# Patient Record
Sex: Male | Born: 1945 | Race: White | Hispanic: No | Marital: Married | Smoking: Former smoker
Health system: Southern US, Community
[De-identification: ages and names within clinical notes are randomized; demographics above are authoritative.]

## PROBLEM LIST (undated history)

## (undated) DIAGNOSIS — I6529 Occlusion and stenosis of unspecified carotid artery: Secondary | ICD-10-CM

## (undated) DIAGNOSIS — E119 Type 2 diabetes mellitus without complications: Secondary | ICD-10-CM

## (undated) DIAGNOSIS — C801 Malignant (primary) neoplasm, unspecified: Secondary | ICD-10-CM

## (undated) DIAGNOSIS — K219 Gastro-esophageal reflux disease without esophagitis: Secondary | ICD-10-CM

## (undated) DIAGNOSIS — E1142 Type 2 diabetes mellitus with diabetic polyneuropathy: Secondary | ICD-10-CM

## (undated) DIAGNOSIS — R531 Weakness: Secondary | ICD-10-CM

## (undated) DIAGNOSIS — Z8673 Personal history of transient ischemic attack (TIA), and cerebral infarction without residual deficits: Secondary | ICD-10-CM

## (undated) DIAGNOSIS — I10 Essential (primary) hypertension: Secondary | ICD-10-CM

## (undated) DIAGNOSIS — T884XXA Failed or difficult intubation, initial encounter: Secondary | ICD-10-CM

## (undated) DIAGNOSIS — J90 Pleural effusion, not elsewhere classified: Secondary | ICD-10-CM

## (undated) DIAGNOSIS — E785 Hyperlipidemia, unspecified: Secondary | ICD-10-CM

## (undated) DIAGNOSIS — I639 Cerebral infarction, unspecified: Secondary | ICD-10-CM

## (undated) DIAGNOSIS — M19019 Primary osteoarthritis, unspecified shoulder: Secondary | ICD-10-CM

## (undated) DIAGNOSIS — N183 Chronic kidney disease, stage 3 (moderate): Secondary | ICD-10-CM

## (undated) DIAGNOSIS — I82409 Acute embolism and thrombosis of unspecified deep veins of unspecified lower extremity: Secondary | ICD-10-CM

## (undated) DIAGNOSIS — I679 Cerebrovascular disease, unspecified: Secondary | ICD-10-CM

## (undated) HISTORY — DX: Occlusion and stenosis of unspecified carotid artery: I65.29

## (undated) HISTORY — DX: Personal history of transient ischemic attack (TIA), and cerebral infarction without residual deficits: Z86.73

## (undated) HISTORY — DX: Gastro-esophageal reflux disease without esophagitis: K21.9

## (undated) HISTORY — DX: Weakness: R53.1

## (undated) HISTORY — DX: Acute embolism and thrombosis of unspecified deep veins of unspecified lower extremity: I82.409

## (undated) HISTORY — DX: Primary osteoarthritis, unspecified shoulder: M19.019

## (undated) HISTORY — DX: Cerebrovascular disease, unspecified: I67.9

## (undated) HISTORY — DX: Essential (primary) hypertension: I10

## (undated) HISTORY — DX: Pleural effusion, not elsewhere classified: J90

## (undated) HISTORY — DX: Type 2 diabetes mellitus with diabetic polyneuropathy: E11.42

## (undated) HISTORY — DX: Cerebral infarction, unspecified: I63.9

## (undated) HISTORY — DX: Chronic kidney disease, stage 3 (moderate): N18.3

## (undated) HISTORY — DX: Type 2 diabetes mellitus without complications: E11.9

## (undated) HISTORY — PX: TONSILLECTOMY: SHX5217

## (undated) HISTORY — DX: Hyperlipidemia, unspecified: E78.5

---

## 1998-11-25 HISTORY — PX: NASAL SINUS SURGERY: SHX719

## 2004-02-20 ENCOUNTER — Other Ambulatory Visit: Payer: Self-pay

## 2008-02-10 ENCOUNTER — Ambulatory Visit: Payer: Self-pay | Admitting: Otolaryngology

## 2010-08-07 ENCOUNTER — Emergency Department: Payer: Self-pay | Admitting: Unknown Physician Specialty

## 2014-02-26 DIAGNOSIS — I639 Cerebral infarction, unspecified: Secondary | ICD-10-CM

## 2014-02-26 HISTORY — DX: Cerebral infarction, unspecified: I63.9

## 2014-03-01 DIAGNOSIS — I6529 Occlusion and stenosis of unspecified carotid artery: Secondary | ICD-10-CM

## 2014-03-01 HISTORY — DX: Occlusion and stenosis of unspecified carotid artery: I65.29

## 2014-03-25 ENCOUNTER — Encounter: Payer: Self-pay | Admitting: Surgery

## 2014-03-28 ENCOUNTER — Encounter: Payer: Self-pay | Admitting: Surgery

## 2014-04-11 ENCOUNTER — Encounter: Payer: No Typology Code available for payment source | Admitting: Surgery

## 2014-04-22 ENCOUNTER — Encounter: Payer: Self-pay | Admitting: Surgery

## 2014-04-25 ENCOUNTER — Ambulatory Visit (INDEPENDENT_AMBULATORY_CARE_PROVIDER_SITE_OTHER): Payer: Medicare Other | Admitting: Surgery

## 2014-04-25 ENCOUNTER — Encounter: Payer: Self-pay | Admitting: Surgery

## 2014-04-25 VITALS — BP 162/81 | HR 63 | Ht 70.0 in | Wt 209.4 lb

## 2014-04-25 DIAGNOSIS — I6529 Occlusion and stenosis of unspecified carotid artery: Secondary | ICD-10-CM

## 2014-04-25 NOTE — Progress Notes (Signed)
Patient name: Ian Shaffer MRN: [id number] DOB: 1946/06/24 Sex: male   Referred by: Dr. Loney Hering  Reason for referral:  Chief Complaint  Patient presents with  . New Evaluation    Lt ICA occlusion, hx of CVA    HISTORY OF PRESENT ILLNESS: He went to the beach the same day and a mild air continued to feel poorly.  He went to the emergency department.  He ultimately wound up being diagnosed with a stroke.  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  The patient has regained nearly all of his deficits.  He doesn't endorse some postural hypotension, as well as occasional dizziness.  The patient is a diabetic.  He states his most recent hemoglobin A1c was 6.2-6.7.  He does occasionally get pain in his feet.  He suffers from hypercholesterolemia which is managed with a statin.  He is also medically managed for hypertension.  He tries to exercise for 30-60 minutes per day.  He is now eating a heart healthy diet.  He is on full antiplatelet medications since his stroke with aspirin and Plavix.  He is a former smoker.  Past Medical History  Diagnosis Date  . H/O: CVA (cerebrovascular accident)   . CVA (cerebral infarction)   . ICAO (internal carotid artery occlusion) March 01, 2014    Left  . Weakness   . Hypertension   . Hyperlipidemia   . Diabetes mellitus without complication   . Stroke   . DVT (deep venous thrombosis)     Past Surgical History  Procedure Laterality Date  . Nasal sinus surgery  2000    History   Social History  . Marital Status: Unknown    Spouse Name: N/A    Number of Children: N/A  . Years of Education: N/A   Occupational History  . Not on file.   Social History Main Topics  . Smoking status: Former Research scientist (life sciences)  . Smokeless tobacco: Not on file     Comment: pt states he smoked a pipe a long time ago  . Alcohol Use: No  . Drug Use: No  . Sexual Activity: Not on file   Other Topics Concern  . Not on file   Social History Narrative  .  No narrative on file    Family History  Problem Relation Age of Onset  . Hypertension Mother   . Hypertension Father     Allergies as of 04/25/2014 - Review Complete 04/25/2014  Allergen Reaction Noted  . Sulfa antibiotics Swelling 04/25/2014    No current outpatient prescriptions on file prior to visit.   No current facility-administered medications on file prior to visit.     REVIEW OF SYSTEMS: Cardiovascular: No chest pain, chest pressure, palpitations, orthopnea, or dyspnea on exertion. No claudication or rest pain,  No history of DVT or phlebitis. Pulmonary: No productive cough, asthma or wheezing. Neurologic: Occasional dizziness.  History of right arm weakness Hematologic: No bleeding problems or clotting disorders. Musculoskeletal: No joint pain or joint swelling. Gastrointestinal: No blood in stool or hematemesis Genitourinary: No dysuria or hematuria. Psychiatric:: No history of major depression. Integumentary: No rashes or ulcers. Constitutional: No fever or chills.  PHYSICAL EXAMINATION: General: The patient appears their stated age.  Vital signs are BP 162/81  Pulse 63  Ht 5\' 10"  (1.778 m)  Wt 209 lb 6.4 oz (94.983 kg)  BMI 30.05 kg/m2  SpO2 100% HEENT:  No gross abnormalities Pulmonary: Respirations are non-labored Abdomen: Soft  and non-tender  Musculoskeletal: There are no major deformities.   Neurologic: No focal weakness or paresthesias are detected, Skin: There are no ulcer or rashes noted. Psychiatric: The patient has normal affect. Cardiovascular: There is a regular rate and rhythm without significant murmur appreciated.  No carotid bruits.  Palpable pedal pulses (posterior tibial)  Diagnostic Studies: I have reviewed his outside ultrasound which he brought the ICD.  He has an occluded left internal carotid artery and minimal stenosis on the right, less than 39%.  His outside MRI report shows a small acute left.  Ventricular white matter a  lacunar infarct   Assessment:  Occluded left carotid artery Plan: I stressed the importance of medical management with the patient.  He appears to be optimized from a medical perspective.  I discussed that with an occluded internal carotid artery, which has been confirmed with both ultrasound and MRI, there is no role for revascularization of the occluded left carotid.  I stressed the importance of serial ultrasound surveillance of the right carotid artery.  I have scheduled him for followup in one year.     Eldridge Abrahams, M.D. [facility] Office: [phone] Pager:  [phone]

## 2014-04-25 NOTE — Addendum Note (Signed)
Addended by: Mena Goes on: 04/25/2014 05:11 PM   Modules accepted: Orders

## 2014-05-31 DIAGNOSIS — I679 Cerebrovascular disease, unspecified: Secondary | ICD-10-CM

## 2014-05-31 DIAGNOSIS — E119 Type 2 diabetes mellitus without complications: Secondary | ICD-10-CM | POA: Insufficient documentation

## 2014-05-31 DIAGNOSIS — I639 Cerebral infarction, unspecified: Secondary | ICD-10-CM

## 2014-05-31 DIAGNOSIS — K219 Gastro-esophageal reflux disease without esophagitis: Secondary | ICD-10-CM | POA: Insufficient documentation

## 2014-05-31 DIAGNOSIS — I10 Essential (primary) hypertension: Secondary | ICD-10-CM | POA: Insufficient documentation

## 2014-05-31 DIAGNOSIS — E785 Hyperlipidemia, unspecified: Secondary | ICD-10-CM | POA: Insufficient documentation

## 2014-05-31 HISTORY — DX: Gastro-esophageal reflux disease without esophagitis: K21.9

## 2014-05-31 HISTORY — DX: Essential (primary) hypertension: I10

## 2014-05-31 HISTORY — DX: Cerebrovascular disease, unspecified: I67.9

## 2014-05-31 HISTORY — DX: Cerebral infarction, unspecified: I63.9

## 2015-05-01 ENCOUNTER — Ambulatory Visit: Payer: Medicare Other | Admitting: Family

## 2015-05-01 ENCOUNTER — Other Ambulatory Visit (HOSPITAL_COMMUNITY): Payer: Medicare Other

## 2015-05-03 ENCOUNTER — Encounter: Payer: Self-pay | Admitting: Family

## 2015-05-08 ENCOUNTER — Ambulatory Visit (INDEPENDENT_AMBULATORY_CARE_PROVIDER_SITE_OTHER): Payer: Medicare Other | Admitting: Family

## 2015-05-08 ENCOUNTER — Ambulatory Visit (HOSPITAL_COMMUNITY)
Admission: RE | Admit: 2015-05-08 | Discharge: 2015-05-08 | Disposition: A | Discharge status: Home / Self Care | Payer: Medicare Other | Source: Ambulatory Visit | Attending: Family | Admitting: Family

## 2015-05-08 ENCOUNTER — Encounter: Payer: Self-pay | Admitting: Family

## 2015-05-08 VITALS — BP 144/82 | HR 64 | Resp 16 | Ht 70.0 in | Wt 214.0 lb

## 2015-05-08 DIAGNOSIS — I6522 Occlusion and stenosis of left carotid artery: Secondary | ICD-10-CM

## 2015-05-08 DIAGNOSIS — Z8673 Personal history of transient ischemic attack (TIA), and cerebral infarction without residual deficits: Secondary | ICD-10-CM

## 2015-05-08 DIAGNOSIS — I6523 Occlusion and stenosis of bilateral carotid arteries: Secondary | ICD-10-CM | POA: Diagnosis present

## 2015-05-08 NOTE — Patient Instructions (Signed)
Stroke Prevention Some medical conditions and behaviors are associated with an increased chance of having a stroke. You may prevent a stroke by making healthy choices and managing medical conditions. HOW CAN I REDUCE MY RISK OF HAVING A STROKE?   Stay physically active. Get at least 30 minutes of activity on most or all days.  Do not smoke. It may also be helpful to avoid exposure to secondhand smoke.  Limit alcohol use. Moderate alcohol use is considered to be:  No more than 2 drinks per day for men.  No more than 1 drink per day for nonpregnant women.  Eat healthy foods. This involves:  Eating 5 or more servings of fruits and vegetables a day.  Making dietary changes that address high blood pressure (hypertension), high cholesterol, diabetes, or obesity.  Manage your cholesterol levels.  Making food choices that are high in fiber and low in saturated fat, trans fat, and cholesterol may control cholesterol levels.  Take any prescribed medicines to control cholesterol as directed by your health care provider.  Manage your diabetes.  Controlling your carbohydrate and sugar intake is recommended to manage diabetes.  Take any prescribed medicines to control diabetes as directed by your health care provider.  Control your hypertension.  Making food choices that are low in salt (sodium), saturated fat, trans fat, and cholesterol is recommended to manage hypertension.  Take any prescribed medicines to control hypertension as directed by your health care provider.  Maintain a healthy weight.  Reducing calorie intake and making food choices that are low in sodium, saturated fat, trans fat, and cholesterol are recommended to manage weight.  Stop drug abuse.  Avoid taking birth control pills.  Talk to your health care provider about the risks of taking birth control pills if you are over 35 years old, smoke, get migraines, or have ever had a blood clot.  Get evaluated for sleep  disorders (sleep apnea).  Talk to your health care provider about getting a sleep evaluation if you snore a lot or have excessive sleepiness.  Take medicines only as directed by your health care provider.  For some people, aspirin or blood thinners (anticoagulants) are helpful in reducing the risk of forming abnormal blood clots that can lead to stroke. If you have the irregular heart rhythm of atrial fibrillation, you should be on a blood thinner unless there is a good reason you cannot take them.  Understand all your medicine instructions.  Make sure that other conditions (such as anemia or atherosclerosis) are addressed. SEEK IMMEDIATE MEDICAL CARE IF:   You have sudden weakness or numbness of the face, arm, or leg, especially on one side of the body.  Your face or eyelid droops to one side.  You have sudden confusion.  You have trouble speaking (aphasia) or understanding.  You have sudden trouble seeing in one or both eyes.  You have sudden trouble walking.  You have dizziness.  You have a loss of balance or coordination.  You have a sudden, severe headache with no known cause.  You have new chest pain or an irregular heartbeat. Any of these symptoms may represent a serious problem that is an emergency. Do not wait to see if the symptoms will go away. Get medical help at once. Call your local emergency services (911 in [place] Do not drive yourself to the hospital. Document Released: 12/19/2004 Document Revised: 03/28/2014 Document Reviewed: 05/14/2013 [employer] Patient Information 2015 [employer]. This information is not intended to replace advice given   to you by your health care provider. Make sure you discuss any questions you have with your health care provider.  

## 2015-05-08 NOTE — Progress Notes (Signed)
Established Carotid Patient   History of Present Illness  Ian Shaffer is a 69 y.o. male patient of Dr. Trula Slade who has a history of a stroke on 02/26/14 (small acute left ventricular white matter lacunar infarct) and a known left ICA occlusion. He returns today for follow up. Patient has not had previous carotid artery intervention. Dr. Trula Slade reviewed pt carotid Duplex from an outside facility at pt's June 2015 visit.  He had an occluded left internal carotid artery and minimal stenosis on the right, less than 39%. His outside MRI report shows a small acute left ventricular white matter a lacunar infarct.  His stroke was manifested as right upper extremity weakness and loss of hand dexterity that lasted only 3 weeks, dizziness; he denies amaurosis fugax, denies speech difficulties during his stroke. He denies any subsequent stroke or TIA symptoms.   Pt states that his dizziness may be related to his sinus issues.  He states he has drastically changed his diet for the better and is exercising 30 minutes or more/day.   Pt denies claudication symptoms with walking, denies non healing wounds.  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   The patient denies New Medical or Surgical History.  Pt Diabetic: yes, states his last A1C was 6.1 Pt smoker: former smoker of a pipe only, quit in 1975  Pt meds include: Statin : yes ASA: yes Other anticoagulants/antiplatelets: Plavix   Past Medical History  Diagnosis Date  . H/O: CVA (cerebrovascular accident)   . CVA (cerebral infarction)   . ICAO (internal carotid artery occlusion) March 01, 2014    Left  . Weakness   . Hypertension   . Hyperlipidemia   . Diabetes mellitus without complication   . DVT (deep venous thrombosis)   . Stroke April, 4,2015    Social History History  Substance Use Topics  . Smoking status: Former Smoker    Types: Pipe    Quit date: 05/07/1974  . Smokeless tobacco: Never Used     Comment: pt states he  smoked a pipe a long time ago  . Alcohol Use: No    Family History Family History  Problem Relation Age of Onset  . Hypertension Mother   . Hypertension Father     Surgical History Past Surgical History  Procedure Laterality Date  . Nasal sinus surgery  2000    Allergies  Allergen Reactions  . Sulfa Antibiotics Swelling    Current Outpatient Prescriptions  Medication Sig Dispense Refill  . amLODipine (NORVASC) 5 MG tablet Take 5 mg by mouth daily.    Marland Kitchen aspirin 81 MG tablet Take 81 mg by mouth daily.    . clopidogrel (PLAVIX) 75 MG tablet Take 75 mg by mouth daily with breakfast.     . Liraglutide (VICTOZA [place]) Inject 1.2 mg into the skin daily.     Marland Kitchen lisinopril (PRINIVIL,ZESTRIL) 10 MG tablet Take by mouth daily.    . metoprolol succinate (TOPROL-XL) 25 MG 24 hr tablet Take 25 mg by mouth daily.     . pantoprazole (PROTONIX) 40 MG tablet Take 40 mg by mouth daily.     . pravastatin (PRAVACHOL) 20 MG tablet Take 20 mg by mouth daily.      No current facility-administered medications for this visit.    Review of Systems : See HPI for pertinent positives and negatives.  Physical Examination  Filed Vitals:   05/08/15 1453 05/08/15 1458  BP: 153/84 144/82  Pulse: 65 64  Resp:  16  Height:  5\' 10"  (1.778 m)  Weight:  214 lb (97.07 kg)  SpO2:  99%   Body mass index is 30.71 kg/(m^2).  General: WDWN male in NAD GAIT: normal Eyes: PERRLA Pulmonary:  Non-labored, CTAB, no adventitious sounds.  Cardiac: regular Rhythm, no detected murmur.  VASCULAR EXAM Carotid Bruits Right Left   Negative Negative    Aorta is not palpable. Radial pulses are 2+ palpable and equal.                                                                                                                            LE Pulses Right Left       POPLITEAL  not palpable   not palpable       POSTERIOR TIBIAL  2+ palpable   2+ palpable        DORSALIS PEDIS      ANTERIOR TIBIAL 2+ palpable  2+  palpable     Gastrointestinal: soft, nontender, BS WNL, no r/g,  no palpable masses.  Musculoskeletal: No muscle atrophy/wasting. M/S 5/5 throughout, extremities without ischemic changes.  Neurologic: A&O X 3; Appropriate Affect;  Speech is normal, CN 2-12 intact, Pain and light touch intact in extremities, Motor exam as listed above.   Non-Invasive Vascular Imaging CAROTID DUPLEX 05/08/2015   CEREBROVASCULAR DUPLEX EVALUATION    INDICATION: Carotid artery disease     PREVIOUS INTERVENTION(S):     DUPLEX EXAM:     RIGHT  LEFT  Peak Systolic Velocities (cm/s) End Diastolic Velocities (cm/s) Plaque LOCATION Peak Systolic Velocities (cm/s) End Diastolic Velocities (cm/s) Plaque  83 11  CCA PROXIMAL 96 17   82 18  CCA MID 81 13 HT  70 15 HT CCA DISTAL 72 13 HT  149 17  ECA 153 24   92 25 HT ICA PROXIMAL 0 0 HT  73 26  ICA MID 0 0 HT  64 23  ICA DISTAL 0 0 HT    1.12 ICA / CCA Ratio (PSV) Not applicable  Antegrade  Vertebral Flow Antegrade   443 Brachial Systolic Pressure (mmHg) 154  Multiphasic (Subclavian artery) Brachial Artery Waveforms Multiphasic (Subclavian artery)    Plaque Morphology:  HM = Homogeneous, HT = Heterogeneous, CP = Calcific Plaque, SP = Smooth Plaque, IP = Irregular Plaque  ADDITIONAL FINDINGS:     IMPRESSION: Right internal carotid artery velocities suggest a 1-49% stenosis. There is no color or Doppler flow noted within the left internal carotid artery.       Compared to the previous exam:  No prior exam performed at this facility for comparison.       Assessment: Ian Shaffer is a 69 y.o. male who has a history of a stroke on 02/26/14 (small acute left ventricular white matter lacunar infarct) and a known left ICA occlusion. He has had no subsequent stroke or TIA. Today's carotid Duplex suggests 1-49% right ICA stenosis and confirmed left ICA occlusion.  Plan: Follow-up in 1 year with Carotid Duplex   I discussed in depth with the  patient the nature of atherosclerosis, and emphasized the importance of maximal medical management including strict control of blood pressure, blood glucose, and lipid levels, obtaining regular exercise, and continued cessation of smoking.  The patient is aware that without maximal medical management the underlying atherosclerotic disease process will progress, limiting the benefit of any interventions. The patient was given information about stroke prevention and what symptoms should prompt the patient to seek immediate medical care. Thank you for allowing [place] to participate in this patient's care.  Clemon Chambers, RN, MSN, FNP-C [facility] Office: [facility] Physician: Trula Slade   05/08/2015 3:16 PM

## 2016-02-23 DIAGNOSIS — N183 Chronic kidney disease, stage 3 unspecified: Secondary | ICD-10-CM | POA: Insufficient documentation

## 2016-02-23 DIAGNOSIS — E1142 Type 2 diabetes mellitus with diabetic polyneuropathy: Secondary | ICD-10-CM | POA: Insufficient documentation

## 2016-02-23 HISTORY — DX: Chronic kidney disease, stage 3 unspecified: N18.30

## 2016-02-23 HISTORY — DX: Type 2 diabetes mellitus with diabetic polyneuropathy: E11.42

## 2016-05-06 ENCOUNTER — Encounter: Payer: Self-pay | Admitting: Family

## 2016-05-13 ENCOUNTER — Encounter: Payer: Self-pay | Admitting: Family

## 2016-05-13 ENCOUNTER — Ambulatory Visit (INDEPENDENT_AMBULATORY_CARE_PROVIDER_SITE_OTHER): Payer: Medicare Other | Admitting: Family

## 2016-05-13 ENCOUNTER — Ambulatory Visit (HOSPITAL_COMMUNITY)
Admission: RE | Admit: 2016-05-13 | Discharge: 2016-05-13 | Disposition: A | Discharge status: Home / Self Care | Payer: Medicare Other | Source: Ambulatory Visit | Attending: Family | Admitting: Family

## 2016-05-13 VITALS — BP 132/75 | HR 74 | Temp 98.1°F | Resp 16 | Ht 70.0 in | Wt 217.0 lb

## 2016-05-13 DIAGNOSIS — E119 Type 2 diabetes mellitus without complications: Secondary | ICD-10-CM | POA: Insufficient documentation

## 2016-05-13 DIAGNOSIS — Z8673 Personal history of transient ischemic attack (TIA), and cerebral infarction without residual deficits: Secondary | ICD-10-CM

## 2016-05-13 DIAGNOSIS — I10 Essential (primary) hypertension: Secondary | ICD-10-CM | POA: Insufficient documentation

## 2016-05-13 DIAGNOSIS — I6521 Occlusion and stenosis of right carotid artery: Secondary | ICD-10-CM | POA: Diagnosis not present

## 2016-05-13 DIAGNOSIS — I6522 Occlusion and stenosis of left carotid artery: Secondary | ICD-10-CM

## 2016-05-13 DIAGNOSIS — E785 Hyperlipidemia, unspecified: Secondary | ICD-10-CM | POA: Diagnosis not present

## 2016-05-13 NOTE — Patient Instructions (Signed)
Stroke Prevention Some medical conditions and behaviors are associated with an increased chance of having a stroke. You may prevent a stroke by making healthy choices and managing medical conditions. HOW CAN I REDUCE MY RISK OF HAVING A STROKE?   Stay physically active. Get at least 30 minutes of activity on most or all days.  Do not smoke. It may also be helpful to avoid exposure to secondhand smoke.  Limit alcohol use. Moderate alcohol use is considered to be:  No more than 2 drinks per day for men.  No more than 1 drink per day for nonpregnant women.  Eat healthy foods. This involves:  Eating 5 or more servings of fruits and vegetables a day.  Making dietary changes that address high blood pressure (hypertension), high cholesterol, diabetes, or obesity.  Manage your cholesterol levels.  Making food choices that are high in fiber and low in saturated fat, trans fat, and cholesterol may control cholesterol levels.  Take any prescribed medicines to control cholesterol as directed by your health care provider.  Manage your diabetes.  Controlling your carbohydrate and sugar intake is recommended to manage diabetes.  Take any prescribed medicines to control diabetes as directed by your health care provider.  Control your hypertension.  Making food choices that are low in salt (sodium), saturated fat, trans fat, and cholesterol is recommended to manage hypertension.  Ask your health care provider if you need treatment to lower your blood pressure. Take any prescribed medicines to control hypertension as directed by your health care provider.  If you are 18-39 years of age, have your blood pressure checked every 3-5 years. If you are 40 years of age or older, have your blood pressure checked every year.  Maintain a healthy weight.  Reducing calorie intake and making food choices that are low in sodium, saturated fat, trans fat, and cholesterol are recommended to manage  weight.  Stop drug abuse.  Avoid taking birth control pills.  Talk to your health care provider about the risks of taking birth control pills if you are over 35 years old, smoke, get migraines, or have ever had a blood clot.  Get evaluated for sleep disorders (sleep apnea).  Talk to your health care provider about getting a sleep evaluation if you snore a lot or have excessive sleepiness.  Take medicines only as directed by your health care provider.  For some people, aspirin or blood thinners (anticoagulants) are helpful in reducing the risk of forming abnormal blood clots that can lead to stroke. If you have the irregular heart rhythm of atrial fibrillation, you should be on a blood thinner unless there is a good reason you cannot take them.  Understand all your medicine instructions.  Make sure that other conditions (such as anemia or atherosclerosis) are addressed. SEEK IMMEDIATE MEDICAL CARE IF:   You have sudden weakness or numbness of the face, arm, or leg, especially on one side of the body.  Your face or eyelid droops to one side.  You have sudden confusion.  You have trouble speaking (aphasia) or understanding.  You have sudden trouble seeing in one or both eyes.  You have sudden trouble walking.  You have dizziness.  You have a loss of balance or coordination.  You have a sudden, severe headache with no known cause.  You have new chest pain or an irregular heartbeat. Any of these symptoms may represent a serious problem that is an emergency. Do not wait to see if the symptoms will   go away. Get medical help at once. Call your local emergency services (911 in [place] Do not drive yourself to the hospital.   This information is not intended to replace advice given to you by your health care provider. Make sure you discuss any questions you have with your health care provider.   Document Released: 12/19/2004 Document Revised: 12/02/2014 Document Reviewed:  05/14/2013 Elsevier Interactive Patient Education 2016 [employer]  

## 2016-05-13 NOTE — Progress Notes (Signed)
Chief Complaint: Follow up Extracranial Carotid Artery Stenosis   History of Present Illness  Ian Shaffer is a 70 y.o. male patient of Dr. Trula Slade who has a history of a stroke on 02/26/14 (small acute left ventricular white matter lacunar infarct) and a known left ICA occlusion. He returns today for follow up. Patient has not had previous carotid artery intervention. Dr. Trula Slade reviewed pt carotid Duplex from an outside facility at pt's June 2015 visit. He had an occluded left internal carotid artery and minimal stenosis on the right, less than 39%. His outside MRI report shows a small acute left ventricular white matter a lacunar infarct.  His stroke was manifested as right upper extremity weakness and loss of hand dexterity that lasted 3 weeks, dizziness; he denies amaurosis fugax, denies speech difficulties during his stroke. He denies any subsequent stroke or TIA symptoms.   Pt states that his dizziness may be related to his sinus issues.  He states he has drastically changed his diet for the better and is exercising 30 minutes or more/day.   Pt denies claudication symptoms with walking, denies non healing wounds.  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   The patient denies New Medical or Surgical History.  Pt Diabetic: yes, states his last A1C was 6.1 Pt smoker: former smoker of a pipe only, quit in 1975  Pt meds include: Statin : yes ASA: yes Other anticoagulants/antiplatelets: Plavix    Past Medical History  Diagnosis Date  . H/O: CVA (cerebrovascular accident)   . CVA (cerebral infarction)   . ICAO (internal carotid artery occlusion) March 01, 2014    Left  . Weakness   . Hypertension   . Hyperlipidemia   . Diabetes mellitus without complication ([place])   . DVT (deep venous thrombosis) ([place])   . Stroke [facility]) April, 4,2015    Social History Social History  Substance Use Topics  . Smoking status: Former Smoker    Types: Pipe    Quit date: 05/07/1974   . Smokeless tobacco: Never Used     Comment: pt states he smoked a pipe a long time ago  . Alcohol Use: No    Family History Family History  Problem Relation Age of Onset  . Hypertension Mother   . Hypertension Father     Surgical History Past Surgical History  Procedure Laterality Date  . Nasal sinus surgery  2000    Allergies  Allergen Reactions  . Amoxicillin Nausea Only  . Sulfa Antibiotics Swelling    Current Outpatient Prescriptions  Medication Sig Dispense Refill  . amLODipine (NORVASC) 5 MG tablet Take 5 mg by mouth daily.    Marland Kitchen aspirin 81 MG tablet Take 81 mg by mouth daily.    . clopidogrel (PLAVIX) 75 MG tablet Take 75 mg by mouth daily with breakfast.     . Liraglutide (VICTOZA [place]) Inject 1.2 mg into the skin daily.     Marland Kitchen lisinopril (PRINIVIL,ZESTRIL) 10 MG tablet Take by mouth daily.    . metoprolol succinate (TOPROL-XL) 25 MG 24 hr tablet Take 25 mg by mouth daily.     . pantoprazole (PROTONIX) 40 MG tablet Take 40 mg by mouth daily.     . pravastatin (PRAVACHOL) 20 MG tablet Take 40 mg by mouth daily.      No current facility-administered medications for this visit.    Review of Systems : See HPI for pertinent positives and negatives.  Physical Examination  Filed Vitals:   05/13/16  1351 05/13/16 1353  BP: 137/78 132/75  Pulse: 72 74  Temp: 98.1 F (36.7 C)   TempSrc: Oral   Resp: 16   Height: 5\' 10"  (1.778 m)   Weight: 217 lb (98.431 kg)   SpO2: 98%    Body mass index is 31.14 kg/(m^2).  General: WDWN obese male in NAD GAIT: normal Eyes: PERRLA Pulmonary: Respirations are non-labored, CTAB, no adventitious sounds.  Cardiac: regular rhythm, no detected murmur.  VASCULAR EXAM Carotid Bruits Right Left   Negative Negative   Aorta is not palpable. Radial pulses are 2+ palpable and equal.      LE  Pulses Right Left   POPLITEAL not palpable  not palpable   POSTERIOR TIBIAL 2+ palpable  2+ palpable    DORSALIS PEDIS  ANTERIOR TIBIAL 2+ palpable  2+ palpable     Gastrointestinal: soft, nontender, BS WNL, no r/g, no palpable masses.  Musculoskeletal: No muscle atrophy/wasting. M/S 5/5 throughout, extremities without ischemic changes.  Neurologic: A&O X 3; Appropriate Affect;  Speech is normal, CN 2-12 intact, Pain and light touch intact in extremities, Motor exam as listed above.          Non-Invasive Vascular Imaging CAROTID DUPLEX 05/13/2016   CEREBROVASCULAR DUPLEX EVALUATION    INDICATION: Carotid artery disease. Known left internal carotid artery occlusion    PREVIOUS INTERVENTION(S):     DUPLEX EXAM: Bilateral carotid duplex    RIGHT  LEFT  Peak Systolic Velocities (cm/s) End Diastolic Velocities (cm/s) Plaque LOCATION Peak Systolic Velocities (cm/s) End Diastolic Velocities (cm/s) Plaque  123 21  CCA PROXIMAL 137 15   89 21  CCA MID 99 18   103 28  CCA DISTAL 95 21   156 24  ECA 175 26   72 21 HT ICA PROXIMAL 52 0   79 32  ICA MID 26 0 HT  62 24  ICA DISTAL 0 0 Occluded    Antegrade Vertebral Flow Antegrade   Brachial Systolic Pressure (mmHg)   Triphasic Subclavian Artery Waveforms Triphasic    Plaque Morphology:  HM = Homogeneous, HT = Heterogeneous, CP = Calcific Plaque, SP = Smooth Plaque, IP = Irregular Plaque     ADDITIONAL FINDINGS:     IMPRESSION: 1. Right:  1-39% internal carotid artery stenosis. 2. Left:  diminished, thumpy flow in the proximal and mid internal carotid artery with no flow noted in the distal portion consistent with occlusion.    Compared to the previous exam:  No significant change compared to exam on 05-08-15.      Assessment: Ian Shaffer is a 70 y.o. male who has a history of a stroke on 02/26/14 (small acute left ventricular white matter lacunar infarct) and a known left ICA  occlusion. He has had no subsequent stroke or TIA. Today's carotid Duplex suggests 1-39% right ICA stenosis and confirmed left ICA occlusion. No significant change compared to exam on 05-08-15.    Plan: Follow-up in 1 year with Carotid Duplex scan.   I discussed in depth with the patient the nature of atherosclerosis, and emphasized the importance of maximal medical management including strict control of blood pressure, blood glucose, and lipid levels, obtaining regular exercise, and continued cessation of smoking.  The patient is aware that without maximal medical management the underlying atherosclerotic disease process will progress, limiting the benefit of any interventions. The patient was given information about stroke prevention and what symptoms should prompt the patient to seek immediate medical care. Thank you for allowing  [place] to participate in this patient's care.  Clemon Chambers, RN, MSN, FNP-C [facility] Office: [facility] Physician: Trula Slade  05/13/2016 1:56 PM

## 2017-04-22 ENCOUNTER — Encounter: Payer: Self-pay | Admitting: Emergency Medicine

## 2017-04-22 ENCOUNTER — Emergency Department: Payer: Medicare Other

## 2017-04-22 ENCOUNTER — Observation Stay
Admission: EM | Admit: 2017-04-22 | Discharge: 2017-04-23 | Disposition: A | Discharge status: Home / Self Care | Payer: Medicare Other | Attending: Internal Medicine | Admitting: Internal Medicine

## 2017-04-22 DIAGNOSIS — Z882 Allergy status to sulfonamides status: Secondary | ICD-10-CM | POA: Insufficient documentation

## 2017-04-22 DIAGNOSIS — Z7984 Long term (current) use of oral hypoglycemic drugs: Secondary | ICD-10-CM | POA: Diagnosis not present

## 2017-04-22 DIAGNOSIS — Z7902 Long term (current) use of antithrombotics/antiplatelets: Secondary | ICD-10-CM | POA: Insufficient documentation

## 2017-04-22 DIAGNOSIS — Z87891 Personal history of nicotine dependence: Secondary | ICD-10-CM | POA: Insufficient documentation

## 2017-04-22 DIAGNOSIS — Z880 Allergy status to penicillin: Secondary | ICD-10-CM | POA: Diagnosis not present

## 2017-04-22 DIAGNOSIS — Z86718 Personal history of other venous thrombosis and embolism: Secondary | ICD-10-CM | POA: Diagnosis not present

## 2017-04-22 DIAGNOSIS — Z8673 Personal history of transient ischemic attack (TIA), and cerebral infarction without residual deficits: Secondary | ICD-10-CM | POA: Insufficient documentation

## 2017-04-22 DIAGNOSIS — N183 Chronic kidney disease, stage 3 (moderate): Secondary | ICD-10-CM | POA: Insufficient documentation

## 2017-04-22 DIAGNOSIS — I493 Ventricular premature depolarization: Secondary | ICD-10-CM

## 2017-04-22 DIAGNOSIS — I129 Hypertensive chronic kidney disease with stage 1 through stage 4 chronic kidney disease, or unspecified chronic kidney disease: Secondary | ICD-10-CM | POA: Insufficient documentation

## 2017-04-22 DIAGNOSIS — Z7982 Long term (current) use of aspirin: Secondary | ICD-10-CM | POA: Diagnosis not present

## 2017-04-22 DIAGNOSIS — E785 Hyperlipidemia, unspecified: Secondary | ICD-10-CM | POA: Insufficient documentation

## 2017-04-22 DIAGNOSIS — J90 Pleural effusion, not elsewhere classified: Secondary | ICD-10-CM | POA: Diagnosis not present

## 2017-04-22 DIAGNOSIS — R0602 Shortness of breath: Secondary | ICD-10-CM | POA: Diagnosis present

## 2017-04-22 DIAGNOSIS — K219 Gastro-esophageal reflux disease without esophagitis: Secondary | ICD-10-CM | POA: Insufficient documentation

## 2017-04-22 DIAGNOSIS — E1122 Type 2 diabetes mellitus with diabetic chronic kidney disease: Secondary | ICD-10-CM | POA: Insufficient documentation

## 2017-04-22 LAB — BASIC METABOLIC PANEL
Anion gap: 9 (ref 5–15)
BUN: 27 mg/dL — ABNORMAL HIGH (ref 6–20)
CO2: 24 mmol/L (ref 22–32)
Calcium: 9.7 mg/dL (ref 8.9–10.3)
Chloride: 105 mmol/L (ref 101–111)
Creatinine, Ser: 1.48 mg/dL — ABNORMAL HIGH (ref 0.61–1.24)
GFR calc Af Amer: 53 mL/min — ABNORMAL LOW (ref >60)
GFR calc non Af Amer: 46 mL/min — ABNORMAL LOW (ref >60)
GLUCOSE: 131 mg/dL — AB (ref 65–99)
Potassium: 4.2 mmol/L (ref 3.5–5.1)
Sodium: 138 mmol/L (ref 135–145)

## 2017-04-22 LAB — CBC
HCT: 42.2 % (ref 40.0–52.0)
HEMOGLOBIN: 14.9 g/dL (ref 13.0–18.0)
MCH: 30.8 pg (ref 26.0–34.0)
MCHC: 35.3 g/dL (ref 32.0–36.0)
MCV: 87.3 fL (ref 80.0–100.0)
Platelets: 231 10*3/uL (ref 150–440)
RBC: 4.84 MIL/uL (ref 4.40–5.90)
RDW: 12.5 % (ref 11.5–14.5)
WBC: 7.8 10*3/uL (ref 3.8–10.6)

## 2017-04-22 LAB — APTT: aPTT: 25 seconds (ref 24–36)

## 2017-04-22 LAB — PROTIME-INR
INR: 0.93
Prothrombin Time: 12.5 seconds (ref 11.4–15.2)

## 2017-04-22 LAB — TROPONIN I: Troponin I: <0.03 ng/mL (ref <0.03)

## 2017-04-22 LAB — BRAIN NATRIURETIC PEPTIDE: B NATRIURETIC PEPTIDE 5: 13 pg/mL (ref 0.0–100.0)

## 2017-04-22 NOTE — ED Triage Notes (Signed)
Seen through [facility] today for C/O SOB.  Patient sent to ED for EKG changes. EKG reviewed by Dr. Cinda Quest, who signed off on EKG.  Ordered no repeat EKG.  Patient states today had some coughing and SOB.  Also sinus congestion.

## 2017-04-22 NOTE — ED Triage Notes (Signed)
Pt sent here from [facility] for [facility] and abnormal EKG.

## 2017-04-22 NOTE — ED Notes (Signed)
Pt presents to ED with c/o shortness of breath starting today while laying down. Pt reports for the past 2 days has suffered from nasal congestion and cough. (+) productive yellow-green sputum. Pt denies shortness of breath at this time, denies chest pain. Pt speaking in complete sentences, respirations even and unlabored.

## 2017-04-22 NOTE — ED Notes (Signed)
Admitting MD at bedside.

## 2017-04-22 NOTE — ED Provider Notes (Signed)
[facility] Emergency Department Provider Note  ____________________________________________   First MD Initiated Contact with Patient 04/22/17 1945     (approximate)  I have reviewed the triage vital signs and the nursing notes.   HISTORY  Chief Complaint Shortness of Breath    HPI Ian Shaffer is a 71 y.o. male with medical history as listed below presents at the recommendation of the urgent care clinic for further evaluation of worsening shortness of breath over the last several days.  He reports that 2-3 days ago he started developing significant nasal congestion, cough productive of yellowish green sputum, and postnasal drip.  He gradually has worsened over the last several days and has developed shortness of breath particularly while lying flat and less so with exertion.  He has not had any chest pain, fevers, chills, nausea, vomiting, abdominal pain.  He was concerned because of his pre-existing medical issues and history of stroke.  He denies any history of cardiac issues or lung disease.  He went to the urgent care and they reported they saw some abnormalities on his EKG, specifically some "extra beats", so they sent him to the emergency department.  He is comfortable lying in bed with the head elevated at about 30, in no acute distress.    Past Medical History:  Diagnosis Date  . CVA (cerebral infarction)   . Diabetes mellitus without complication ([place])   . DVT (deep venous thrombosis) ([place])   . H/O: CVA (cerebrovascular accident)   . Hyperlipidemia   . Hypertension   . ICAO (internal carotid artery occlusion) March 01, 2014   Left  . Stroke [facility]) April, 4,2015  . Weakness     There are no active problems to display for this patient.   Past Surgical History:  Procedure Laterality Date  . NASAL SINUS SURGERY  2000  . TONSILLECTOMY      Prior to Admission medications   Medication Sig Start Date End Date Taking? Authorizing  Provider  amLODipine (NORVASC) 5 MG tablet Take 5 mg by mouth daily.   Yes [provider]  aspirin 81 MG tablet Take 81 mg by mouth daily.   Yes [provider]  clopidogrel (PLAVIX) 75 MG tablet Take 75 mg by mouth daily with breakfast.  03/12/14  Yes [provider]  Liraglutide (VICTOZA [place]) Inject 1.2 mg into the skin daily.    Yes [provider]  lisinopril (PRINIVIL,ZESTRIL) 10 MG tablet Take 10 mg by mouth daily.  10/12/14 04/22/18 Yes [provider]  metoprolol succinate (TOPROL-XL) 25 MG 24 hr tablet Take 25 mg by mouth daily.  03/12/14  Yes [provider]  pantoprazole (PROTONIX) 40 MG tablet Take 40 mg by mouth daily.  02/05/14  Yes [provider]  pravastatin (PRAVACHOL) 40 MG tablet Take 40 mg by mouth daily.  04/07/14  Yes [provider]    Allergies Amoxicillin and Sulfa antibiotics  Family History  Problem Relation Age of Onset  . Hypertension Mother   . Hypertension Father     Social History Social History  Substance Use Topics  . Smoking status: Former Smoker    Types: Pipe    Quit date: 05/07/1974  . Smokeless tobacco: Never Used     Comment: pt states he smoked a pipe a long time ago  . Alcohol use No    Review of Systems Constitutional: No fever/chills Eyes: No visual changes. ENT: He is a congestion and nasal drainage down the back  of his throat Cardiovascular: Denies chest pain. Respiratory: Gradually worsening shortness of breath over the last 3 days, worse when lying flat Gastrointestinal: No abdominal pain.  No nausea, no vomiting.  No diarrhea.  No constipation. Genitourinary: Negative for dysuria. Musculoskeletal: Negative for neck pain.  Negative for back pain. Integumentary: Negative for rash. Neurological: Negative for headaches, focal weakness or numbness.   ____________________________________________   PHYSICAL EXAM:  VITAL SIGNS: ED Triage Vitals [04/22/17 1705]    Enc Vitals Group     BP (!) 167/90     Pulse Rate 78     Resp 16     Temp 97.8 F (36.6 C)     Temp Source Oral     SpO2 98 %     Weight 99.3 kg (219 lb)     Height 1.778 m (5\' 10" )     Head Circumference      Peak Flow      Pain Score      Pain Loc      Pain Edu?      Excl. in [place]?     Constitutional: Alert and oriented. Well appearing and in no acute distress while at rest Eyes: Conjunctivae are normal.  Head: Atraumatic. Nose: No congestion/rhinnorhea. Mouth/Throat: Mucous membranes are moist. Neck: No stridor.  No meningeal signs.   Cardiovascular: Normal rate, irregular rhythm due to frequent extra beats. Good peripheral circulation. Grossly normal heart sounds. Respiratory: Normal respiratory effort.  No retractions. Lungs CTAB except for decreased breath sounds in the right lower lobe Gastrointestinal: Soft and nontender. No distention.  Musculoskeletal: No lower extremity tenderness nor edema. No gross deformities of extremities. Neurologic:  Normal speech and language. No gross focal neurologic deficits are appreciated.  Skin:  Skin is warm, dry and intact. No rash noted. Psychiatric: Mood and affect are normal. Speech and behavior are normal.  ____________________________________________   LABS (all labs ordered are listed, but only abnormal results are displayed)  Labs Reviewed  BASIC METABOLIC PANEL - Abnormal; Notable for the following:       Result Value   Glucose, Bld 131 (*)    BUN 27 (*)    Creatinine, Ser 1.48 (*)    GFR calc non Af Amer 46 (*)    GFR calc Af Amer 53 (*)    All other components within normal limits  CBC  TROPONIN I  PROTIME-INR  APTT  BRAIN NATRIURETIC PEPTIDE   ____________________________________________  EKG  ED ECG REPORT I, Lulubelle Simcoe, the attending physician, personally viewed and interpreted this ECG.  Date: 04/22/2017 EKG Time: 16:31 Rate: 79 Rhythm: Sinus rhythm with frequent PVCs and frequent PACs QRS  Axis: normal Intervals: normal ST/T Wave abnormalities: Non-specific ST segment / T-wave changes, but no evidence of acute ischemia. Conduction Disturbances: none Narrative Interpretation: no evidence of acute ischemia  ____________________________________________  RADIOLOGY I, Karlisa Gaubert, personally viewed and evaluated these images (plain radiographs) as part of my medical decision making, as well as reviewing the written report by the radiologist.   Dg Chest 2 View  Result Date: 04/22/2017 CLINICAL DATA:  Shortness of breath, cough with yellow sputum for 2 days, hypertension, diabetes mellitus, former smoker, history stroke, former smoker EXAM: CHEST  2 VIEW COMPARISON:  None. FINDINGS: Normal heart size, mediastinal contours, and pulmonary vascularity. RIGHT pleural effusion and basilar atelectasis. Minimal central peribronchial thickening. Remaining lungs clear. No pleural effusion or pneumothorax. Bones unremarkable. IMPRESSION: Minimal bronchitic changes with RIGHT pleural effusion and basilar atelectasis. No acute  abnormalities. Electronically Signed   By: Lavonia Dana M.D.   On: 04/22/2017 17:38    ____________________________________________   PROCEDURES  Critical Care performed: No   Procedure(s) performed:   Procedures   ____________________________________________   INITIAL IMPRESSION / ASSESSMENT AND PLAN / ED COURSE  Pertinent labs & imaging results that were available during my care of the patient were reviewed by me and considered in my medical decision making (see chart for details).  The patient is having palpitations with frequent PVCs and PACs which is apparently a new thing for him.  He feels short of breath enough while lying flat and with exertion that it brought him to the doctor/Hospital which is unusual for him.  I have no old thoracic imaging against which to compare but I am concerned about his moderate pleural effusion on the right.  This may be  representative of new onset right-sided heart failure.  I have added on a BNP and I will discuss with pulmonology to review the imaging and discuss appropriate treatment.   Clinical Course as of Apr 22 2309  Tue Apr 22, 2017  2009 I spoke by phone with pulmonology, Dr. Ashok Cordia.  He reviewed the chest x-ray and we discussed the clinical scenario.  He recommended that I make a judgment call after the add-on BNP comes back but he felt it might be appropriate to observe the patient overnight for the pulmonologist at [facility] to perform a thoracentesis in the morning given the moderate pleural effusion on the right side.  I will discuss this with the patient after the BNP results and we will determine the plan, but our concern is that close outpatient follow-up is unlikely to be arranged in an appropriate time period.  [CF]  2110 Discussed with patient.  Given the orthopnea and gradually worsening shortness of breath, moderate right-sided pleural effusion, frequent PVCs, and difficulty getting in quickly to see a pulmonologist, we agreed to observe the patient in the hospital with plans for thoracentesis by pulmonology in the morning.  I explained that the pulmonologist in the morning certainly has the right and the ability to make his or her own decisions but that this is the recommendation by the pulmonologist on call with whom I spoke.  I will speak with the hospitalist when he or she is available.  [CF]    Clinical Course User Index [CF] Hinda Kehr, MD    ____________________________________________  FINAL CLINICAL IMPRESSION(S) / ED DIAGNOSES  Final diagnoses:  Pleural effusion on right  SOB (shortness of breath)  Frequent PVCs     MEDICATIONS GIVEN DURING THIS VISIT:  Medications - No data to display   NEW OUTPATIENT MEDICATIONS STARTED DURING THIS VISIT:  New Prescriptions   No medications on file    Modified Medications   No medications on file     Discontinued Medications   No medications on file     Note:  This document was prepared using Dragon voice recognition software and may include unintentional dictation errors.    Hinda Kehr, MD 04/22/17 [id number]

## 2017-04-22 NOTE — ED Notes (Signed)
Pt ambulatory to restroom

## 2017-04-23 ENCOUNTER — Observation Stay (HOSPITAL_BASED_OUTPATIENT_CLINIC_OR_DEPARTMENT_OTHER)
Admit: 2017-04-23 | Discharge: 2017-04-23 | Disposition: A | Discharge status: Home / Self Care | Payer: Medicare Other | Attending: Family Medicine | Admitting: Family Medicine

## 2017-04-23 ENCOUNTER — Observation Stay: Payer: Medicare Other

## 2017-04-23 DIAGNOSIS — J90 Pleural effusion, not elsewhere classified: Secondary | ICD-10-CM

## 2017-04-23 DIAGNOSIS — I350 Nonrheumatic aortic (valve) stenosis: Secondary | ICD-10-CM

## 2017-04-23 HISTORY — DX: Pleural effusion, not elsewhere classified: J90

## 2017-04-23 LAB — BASIC METABOLIC PANEL
ANION GAP: 6 (ref 5–15)
BUN: 24 mg/dL — ABNORMAL HIGH (ref 6–20)
CALCIUM: 9.1 mg/dL (ref 8.9–10.3)
CHLORIDE: 108 mmol/L (ref 101–111)
CO2: 26 mmol/L (ref 22–32)
Creatinine, Ser: 1.3 mg/dL — ABNORMAL HIGH (ref 0.61–1.24)
GFR calc non Af Amer: 53 mL/min — ABNORMAL LOW (ref >60)
GLUCOSE: 100 mg/dL — AB (ref 65–99)
POTASSIUM: 4 mmol/L (ref 3.5–5.1)
Sodium: 140 mmol/L (ref 135–145)

## 2017-04-23 LAB — GLUCOSE, CAPILLARY
GLUCOSE-CAPILLARY: 143 mg/dL — AB (ref 65–99)
Glucose-Capillary: 134 mg/dL — ABNORMAL HIGH (ref 65–99)
Glucose-Capillary: 167 mg/dL — ABNORMAL HIGH (ref 65–99)

## 2017-04-23 LAB — ECHOCARDIOGRAM COMPLETE
HEIGHTINCHES: 70 in
Weight: 3393.6 oz

## 2017-04-23 LAB — CBC
HEMATOCRIT: 39.9 % — AB (ref 40.0–52.0)
HEMOGLOBIN: 13.9 g/dL (ref 13.0–18.0)
MCH: 30.6 pg (ref 26.0–34.0)
MCHC: 34.8 g/dL (ref 32.0–36.0)
MCV: 87.9 fL (ref 80.0–100.0)
Platelets: 217 10*3/uL (ref 150–440)
RBC: 4.54 MIL/uL (ref 4.40–5.90)
RDW: 12.4 % (ref 11.5–14.5)
WBC: 6.3 10*3/uL (ref 3.8–10.6)

## 2017-04-23 LAB — PROTIME-INR
INR: 1.04
PROTHROMBIN TIME: 13.6 s (ref 11.4–15.2)

## 2017-04-23 MED ORDER — OXYCODONE HCL 5 MG PO TABS: 5.0000 mg | ORAL_TABLET | ORAL | Status: DC | PRN

## 2017-04-23 MED ORDER — SODIUM CHLORIDE 0.9% FLUSH: 3.0000 mL | INTRAVENOUS | Status: DC | PRN

## 2017-04-23 MED ORDER — AMLODIPINE BESYLATE 5 MG PO TABS
5.0000 mg | ORAL_TABLET | Freq: Every day | ORAL | Status: DC
  Administered 2017-04-23: 5 mg via ORAL
  Filled 2017-04-23: qty 1

## 2017-04-23 MED ORDER — SODIUM CHLORIDE 0.9% FLUSH
3.0000 mL | Freq: Two times a day (BID) | INTRAVENOUS | Status: DC
  Administered 2017-04-23 (×2): 3 mL via INTRAVENOUS

## 2017-04-23 MED ORDER — ONDANSETRON HCL 4 MG/2ML IJ SOLN: 4.0000 mg | Freq: Four times a day (QID) | INTRAMUSCULAR | Status: DC | PRN

## 2017-04-23 MED ORDER — INSULIN ASPART 100 UNIT/ML ~~LOC~~ SOLN
0.0000 [IU] | SUBCUTANEOUS | Status: DC
  Administered 2017-04-23 (×2): 3 [IU] via SUBCUTANEOUS
  Filled 2017-04-23 (×2): qty 3

## 2017-04-23 MED ORDER — PRAVASTATIN SODIUM 40 MG PO TABS
40.0000 mg | ORAL_TABLET | Freq: Every day | ORAL | Status: DC
  Administered 2017-04-23: 40 mg via ORAL
  Filled 2017-04-23: qty 1

## 2017-04-23 MED ORDER — MAGNESIUM CITRATE PO SOLN
1.0000 | Freq: Once | ORAL | Status: DC | PRN
  Filled 2017-04-23: qty 296

## 2017-04-23 MED ORDER — ACETAMINOPHEN 325 MG PO TABS: 650.0000 mg | ORAL_TABLET | Freq: Four times a day (QID) | ORAL | Status: DC | PRN

## 2017-04-23 MED ORDER — PANTOPRAZOLE SODIUM 40 MG PO TBEC
40.0000 mg | DELAYED_RELEASE_TABLET | Freq: Every day | ORAL | Status: DC
  Administered 2017-04-23: 40 mg via ORAL
  Filled 2017-04-23: qty 1

## 2017-04-23 MED ORDER — LISINOPRIL 10 MG PO TABS
10.0000 mg | ORAL_TABLET | Freq: Every day | ORAL | Status: DC
Start: 2017-04-23 — End: 2017-04-23
  Administered 2017-04-23: 10 mg via ORAL
  Filled 2017-04-23: qty 1

## 2017-04-23 MED ORDER — ALBUTEROL SULFATE (2.5 MG/3ML) 0.083% IN NEBU: 2.5000 mg | INHALATION_SOLUTION | Freq: Four times a day (QID) | RESPIRATORY_TRACT | Status: DC | PRN

## 2017-04-23 MED ORDER — SENNOSIDES-DOCUSATE SODIUM 8.6-50 MG PO TABS: 1.0000 | ORAL_TABLET | Freq: Every evening | ORAL | Status: DC | PRN

## 2017-04-23 MED ORDER — ONDANSETRON HCL 4 MG PO TABS: 4.0000 mg | ORAL_TABLET | Freq: Four times a day (QID) | ORAL | Status: DC | PRN

## 2017-04-23 MED ORDER — IPRATROPIUM BROMIDE 0.02 % IN SOLN: 0.5000 mg | Freq: Four times a day (QID) | RESPIRATORY_TRACT | Status: DC | PRN

## 2017-04-23 MED ORDER — LEVOFLOXACIN 750 MG PO TABS: 750.0000 mg | ORAL_TABLET | Freq: Every day | ORAL | Status: DC

## 2017-04-23 MED ORDER — HEPARIN SODIUM (PORCINE) 5000 UNIT/ML IJ SOLN
5000.0000 [IU] | Freq: Three times a day (TID) | INTRAMUSCULAR | Status: DC
Start: 2017-04-23 — End: 2017-04-23

## 2017-04-23 MED ORDER — ACETAMINOPHEN 650 MG RE SUPP: 650.0000 mg | Freq: Four times a day (QID) | RECTAL | Status: DC | PRN

## 2017-04-23 MED ORDER — METOPROLOL SUCCINATE ER 25 MG PO TB24
25.0000 mg | ORAL_TABLET | Freq: Every day | ORAL | Status: DC
  Administered 2017-04-23: 25 mg via ORAL
  Filled 2017-04-23: qty 1

## 2017-04-23 MED ORDER — SODIUM CHLORIDE 0.9 % IV SOLN
250.0000 mL | INTRAVENOUS | Status: DC | PRN
Start: 2017-04-23 — End: 2017-04-23

## 2017-04-23 MED ORDER — LEVOFLOXACIN IN D5W 750 MG/150ML IV SOLN
750.0000 mg | Freq: Once | INTRAVENOUS | Status: AC
  Administered 2017-04-23: 750 mg via INTRAVENOUS
  Filled 2017-04-23: qty 150

## 2017-04-23 MED ORDER — BISACODYL 5 MG PO TBEC: 5.0000 mg | DELAYED_RELEASE_TABLET | Freq: Every day | ORAL | Status: DC | PRN

## 2017-04-23 MED ORDER — LEVOFLOXACIN 750 MG PO TABS: 750.0000 mg | ORAL_TABLET | Freq: Every day | ORAL | 0 refills | Status: DC

## 2017-04-23 MED ORDER — LIRAGLUTIDE 18 MG/3ML ~~LOC~~ SOPN: 1.2000 mg | PEN_INJECTOR | Freq: Every day | SUBCUTANEOUS | Status: DC

## 2017-04-23 NOTE — Progress Notes (Signed)
Patient discharged to home. Vitals stable, pt has no complaints. IV and tele discontinued and removed. Antibiotic prescription sent to pt's pharmacy. Discharge instructions given and explained to patient.

## 2017-04-23 NOTE — H&P (Signed)
History and Physical   [facility] - [place] @ [facility] Admission History and Physical [employer], D.O.    Patient Name: Ian Shaffer MR#: [id number] Date of Birth: March 13, 1946 Date of Admission: 04/22/2017  Referring MD/NP/PA: Dr. Karma Greaser Primary Care Physician: Derinda Late, MD Patient coming from: Home  Chief Complaint:  Chief Complaint  Patient presents with  . Shortness of Breath    HPI: Ian Shaffer is a 71 y.o. male with a known history of Hypertension, hyperlipidemia, diabetes, CVA, carotid artery occlusion presents to the emergency department for evaluation of shortness of breath.  Patient was in a usual state of health until several days ago when he describes the onset of nasal and sinus congestion with a cough productive of yellow sputum and associated with progressively worsening shortness of breath and dyspnea on exertion. He presented to urgent care today who referred him to the emergency department secondary to an abnormal EKG..  Patient denies fevers/chills, weakness, dizziness, chest pain, shortness of breath, orthopnea, lower extremity edema, N/V/C/D, abdominal pain, dysuria/frequency, changes in mental status.    Otherwise there has been no change in status. Patient has been taking medication as prescribed and there has been no recent change in medication or diet.  No recent antibiotics.  There has been no recent illness, hospitalizations, travel or sick contacts.    EMS/ED Course: Patient was found have moderate right-sided pleural effusion of unclear etiology for which hospital admission was requested  Review of Systems:  CONSTITUTIONAL: No fever/chills, fatigue, weakness, weight gain/loss, headache. EYES: No blurry or double vision. ENT: Positive nasal congestion, postnasal drip. No tinnitus, redness or soreness of the oropharynx. RESPIRATORY: Positive cough, dyspnea, as per history of present illness.  No hemoptysis.  CARDIOVASCULAR: No chest pain,  palpitations, syncope, orthopnea. No lower extremity edema.  GASTROINTESTINAL: No nausea, vomiting, abdominal pain, diarrhea, constipation.  No hematemesis, melena or hematochezia. GENITOURINARY: No dysuria, frequency, hematuria. ENDOCRINE: No polyuria or nocturia. No heat or cold intolerance. HEMATOLOGY: No anemia, bruising, bleeding. INTEGUMENTARY: No rashes, ulcers, lesions. MUSCULOSKELETAL: No arthritis, gout, dyspnea. NEUROLOGIC: No numbness, tingling, ataxia, seizure-type activity, weakness. PSYCHIATRIC: No anxiety, depression, insomnia.   Past Medical History:  Diagnosis Date  . CVA (cerebral infarction)   . Diabetes mellitus without complication ([place])   . DVT (deep venous thrombosis) ([place])   . H/O: CVA (cerebrovascular accident)   . Hyperlipidemia   . Hypertension   . ICAO (internal carotid artery occlusion) March 01, 2014   Left  . Stroke [facility]) April, 4,2015  . Weakness     Past Surgical History:  Procedure Laterality Date  . NASAL SINUS SURGERY  2000  . TONSILLECTOMY       reports that he quit smoking about 42 years ago. His smoking use included Pipe. He has never used smokeless tobacco. He reports that he does not drink alcohol or use drugs.  Allergies  Allergen Reactions  . Amoxicillin Nausea Only  . Sulfa Antibiotics Swelling   Family History   Medical History Relation Name Comments  Dementia Father    High blood pressure (Hypertension) Father    Stroke Father    Diabetes type II Maternal Grandfather    Stroke Maternal Grandfather    High blood pressure (Hypertension) Mother    Heart disease Paternal Grandfather    Stroke Paternal Grandfather    Diabetes type II Paternal Grandmother       Prior to Admission medications   Medication Sig Start Date End Date Taking? Authorizing Provider  amLODipine (  NORVASC) 5 MG tablet Take 5 mg by mouth daily.   Yes [provider]  aspirin 81 MG tablet Take 81 mg by mouth daily.   Yes  [provider]  clopidogrel (PLAVIX) 75 MG tablet Take 75 mg by mouth daily with breakfast.  03/12/14  Yes [provider]  Liraglutide (VICTOZA [place]) Inject 1.2 mg into the skin daily.    Yes [provider]  lisinopril (PRINIVIL,ZESTRIL) 10 MG tablet Take 10 mg by mouth daily.  10/12/14 04/22/18 Yes [provider]  metoprolol succinate (TOPROL-XL) 25 MG 24 hr tablet Take 25 mg by mouth daily.  03/12/14  Yes [provider]  pantoprazole (PROTONIX) 40 MG tablet Take 40 mg by mouth daily.  02/05/14  Yes [provider]  pravastatin (PRAVACHOL) 40 MG tablet Take 40 mg by mouth daily.  04/07/14  Yes [provider]    Physical Exam: Vitals:   04/22/17 1935 04/22/17 2127 04/22/17 2330 04/23/17 0000  BP: (!) 159/84 137/77 (!) 142/76 (!) 141/78  Pulse: 69 67 67 71  Resp: 20 20    Temp: 98.4 F (36.9 C)     TempSrc: Oral     SpO2: 97% 97% 95% 93%  Weight:      Height:        GENERAL: 71 y.o.-year-old Well-appearing white male patient, well-developed, well-nourished lying in the bed in no acute distress.  Pleasant and cooperative.   HEENT: Head atraumatic, normocephalic. Pupils equal, round, reactive to light and accommodation. No scleral icterus. Extraocular muscles intact. Nares are patent. Oropharynx is clear. Mucus membranes moist. NECK: Supple, full range of motion. No JVD, no bruit heard. No thyroid enlargement, no tenderness, no cervical lymphadenopathy. CHEST: Decreased breath sounds over the right lower lobe. No use of accessory muscles of respiration.  No reproducible chest wall tenderness.  CARDIOVASCULAR: S1, S2 normal. No murmurs, rubs, or gallops. Cap refill <2 seconds. Pulses intact distally.  ABDOMEN: Soft, nondistended, nontender. No rebound, guarding, rigidity. Normoactive bowel sounds present in all four quadrants. No organomegaly or mass. EXTREMITIES: No pedal edema, cyanosis, or clubbing. No calf tenderness or  Homan's sign.  NEUROLOGIC: The patient is alert and oriented x 3. Cranial nerves II through XII are grossly intact with no focal sensorimotor deficit. Muscle strength 5/5 in all extremities. Sensation intact. Gait not checked. PSYCHIATRIC:  Normal affect, mood, thought content. SKIN: Warm, dry, and intact without obvious rash, lesion, or ulcer.    Labs on Admission:  CBC:  Recent Labs Lab 04/22/17 1709  WBC 7.8  HGB 14.9  HCT 42.2  MCV 87.3  PLT 144   Basic Metabolic Panel:  Recent Labs Lab 04/22/17 1709  NA 138  K 4.2  CL 105  CO2 24  GLUCOSE 131*  BUN 27*  CREATININE 1.48*  CALCIUM 9.7   GFR: Estimated Creatinine Clearance: 53.3 mL/min (A) (by C-G formula based on SCr of 1.48 mg/dL (H)). Liver Function Tests: No results for input(s): AST, ALT, ALKPHOS, BILITOT, PROT, ALBUMIN in the last 168 hours. No results for input(s): LIPASE, AMYLASE in the last 168 hours. No results for input(s): AMMONIA in the last 168 hours. Coagulation Profile:  Recent Labs Lab 04/22/17 1709  INR 0.93   Cardiac Enzymes:  Recent Labs Lab 04/22/17 1709  TROPONINI <0.03   BNP (last 3 results) No results for input(s): PROBNP in the last 8760 hours. HbA1C: No results for input(s): HGBA1C in the last 72 hours. CBG: No results for input(s): GLUCAP in  the last 168 hours. Lipid Profile: No results for input(s): CHOL, HDL, LDLCALC, TRIG, CHOLHDL, LDLDIRECT in the last 72 hours. Thyroid Function Tests: No results for input(s): TSH, T4TOTAL, FREET4, T3FREE, THYROIDAB in the last 72 hours. Anemia Panel: No results for input(s): VITAMINB12, FOLATE, FERRITIN, TIBC, IRON, RETICCTPCT in the last 72 hours. Urine analysis: No results found for: COLORURINE, APPEARANCEUR, LABSPEC, PHURINE, GLUCOSEU, HGBUR, BILIRUBINUR, KETONESUR, PROTEINUR, UROBILINOGEN, NITRITE, LEUKOCYTESUR Sepsis Labs: @LABRCNTIP (procalcitonin:4,lacticidven:4) )No results found for this or any previous visit (from the past  240 hour(s)).   Radiological Exams on Admission: Dg Chest 2 View  Result Date: 04/22/2017 CLINICAL DATA:  Shortness of breath, cough with yellow sputum for 2 days, hypertension, diabetes mellitus, former smoker, history stroke, former smoker EXAM: CHEST  2 VIEW COMPARISON:  None. FINDINGS: Normal heart size, mediastinal contours, and pulmonary vascularity. RIGHT pleural effusion and basilar atelectasis. Minimal central peribronchial thickening. Remaining lungs clear. No pleural effusion or pneumothorax. Bones unremarkable. IMPRESSION: Minimal bronchitic changes with RIGHT pleural effusion and basilar atelectasis. No acute abnormalities. Electronically Signed   By: Lavonia Dana M.D.   On: 04/22/2017 17:38    EKG: Normal sinus rhythm at 79 bpm with normal axis PVCs, PACs and nonspecific ST-T wave changes.   Assessment/Plan  This is a 71 y.o. male with a history of  Hypertension, hyperlipidemia, diabetes, CVA, carotid artery occlusion now being admitted with:  #. Pleural effusion - Admit observation -Repeat chest x-ray in a.m. -O2 and Med-Neb therapy as needed - Check echo to evaluate for right sided heart failure -Pulmonary consult for consideration of thoracentesis -Hold aspirin and Plavix tonight -We'll hold morning heparin dose for possibility procedure  #. PVCs - Repeat EKG in AM  #. CK D chronic and stable -Monitor BMP  #. Hypertension -Continue Norvasc, lisinopril, metoprolol  #. History of GERD - Continue Protonix  #. History of hyperlipidemia - Continue pravastatin  #. History of diabetes - Continue Victoza - Accu-Cheks every 4 hours with insulin sliding scale coverage  Admission status: Observation IV Fluids: Hep-Lock Diet/Nutrition: Nothing by mouth Consults called: Pulmonary  DVT Px: Heparin SCDs and early ambulation. Code Status: Full Code  Disposition Plan: To home in 1-2 days  All the records are reviewed and case discussed with ED provider. Management  plans discussed with the patient and/or family who express understanding and agree with plan of care.  Allianna Beaubien D.O. on 04/23/2017 at 12:19 AM Between 7am to 6pm - Pager - [phone] After 6pm go to [email or web address] - [employer] [facility] [place] [facility] Office [phone] CC: Primary care physician; Derinda Late, MD   04/23/2017, 12:19 AM

## 2017-04-23 NOTE — Care Management Obs Status (Signed)
[place] NOTIFICATION   Patient Details  Name: Ian Shaffer MRN: [id number] Date of Birth: 1946/08/13   Medicare Observation Status Notification Given:  No DC < 24 hours   Katrina Stack, RN 04/23/2017, 12:09 PM

## 2017-04-23 NOTE — Discharge Summary (Signed)
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 CVA (cerebral infarction)   . Diabetes mellitus without complication ([place])   . DVT (deep venous thrombosis) ([place])   . H/O: CVA (cerebrovascular accident)   . Hyperlipidemia   . Hypertension   . ICAO (internal carotid artery occlusion) March 01, 2014   Left  . Stroke [facility]) April, 4,2015  . Weakness     HOSPITAL COURSE:   71 year old male with history of diabetes who presented shortness of breath and found to have small pleural effusion.  1. Shortness of breath with small pleural effusion: Repeat chest x-ray shows very minimal pleural effusion. It appears that he has a community-acquired pneumonia. He is started on Levaquin. The recommendations would treat to repeat chest x-ray in 3 weeks to assure clearing of this effusion and pneumonia. He was instructed if he has increasing shortness of breath or dyspnea on exertion he should see his primary care physician. Echocardiogram showed normal ejection fraction without valvular abnormalities.  2. PVCs: If these become a problem patient would benefit from cardiology consultation   3. Diabetes: Patient continue pressure medications with ADA diet  4. Chronic kidney disease stage III: Creatinine is at baseline  5. Essential hypertension: Patient will continue Norvasc, lisinopril and metoprolol  6. Hyperlipidemia: Continue statin DISCHARGE CONDITIONS AND DIET:   Stable for discharge on ADA heart healthy  diet  CONSULTS OBTAINED:  Treatment Team:  Allyne Gee, MD  DRUG ALLERGIES:   Allergies  Allergen Reactions  . Amoxicillin Nausea Only  . Sulfa Antibiotics Swelling    DISCHARGE MEDICATIONS:   Current Discharge Medication List    START taking these medications   Details  levofloxacin (LEVAQUIN) 750 MG tablet Take 1 tablet (750 mg total) by mouth daily. Qty: 4 tablet, Refills: 0      CONTINUE these medications which have NOT CHANGED   Details  amLODipine (NORVASC) 5 MG tablet Take 5 mg by mouth daily.    aspirin 81 MG tablet Take 81 mg by mouth daily.    clopidogrel (PLAVIX) 75 MG tablet Take 75 mg by mouth daily with breakfast.     Liraglutide (VICTOZA [place]) Inject 1.2 mg into the skin daily.     lisinopril (PRINIVIL,ZESTRIL) 10 MG tablet Take 10 mg by mouth daily.     metoprolol succinate (TOPROL-XL) 25 MG 24 hr tablet Take 25 mg by mouth daily.     pantoprazole (PROTONIX) 40 MG tablet Take 40 mg by mouth daily.     pravastatin (PRAVACHOL) 40 MG tablet Take 40 mg by mouth daily.           Today   CHIEF COMPLAINT:  Patient is doing well. Patient is not hypoxic. Patient is not having chest pain or shortness of breath.   VITAL SIGNS:  Blood pressure 139/72, pulse 72, temperature 98.2 F (36.8 C), temperature source Oral, resp. rate 18, height 5\' 10"  (1.778 m), weight 96.2 kg (212 lb 1.6 oz), SpO2 94 %.   REVIEW OF SYSTEMS:  Review of Systems  Constitutional: Negative.  Negative for chills, fever and malaise/fatigue.  HENT: Negative.  Negative for ear discharge, ear pain, hearing loss, nosebleeds and sore throat.   Eyes: Negative.  Negative for blurred vision and pain.  Respiratory: Negative.  Negative for cough, hemoptysis, shortness of breath and wheezing.   Cardiovascular: Negative.  Negative for chest pain, palpitations and leg swelling.  Gastrointestinal: Negative.  Negative for abdominal pain, blood in stool, diarrhea, nausea and vomiting.   Genitourinary: Negative.  Negative for dysuria.  Musculoskeletal: Negative.  Negative for back pain.  Skin: Negative.   Neurological: Negative for dizziness, tremors, speech change, focal weakness, seizures and headaches.  Endo/Heme/Allergies: Negative.  Does not bruise/bleed easily.  Psychiatric/Behavioral: Negative.  Negative for depression, hallucinations and suicidal ideas.     PHYSICAL EXAMINATION:  GENERAL:  71 y.o.-year-old patient lying in the bed with no acute distress.  NECK:  Supple, no jugular venous distention. No thyroid enlargement, no tenderness.  LUNGS: Normal breath sounds bilaterally, no wheezing, rales,rhonchi  No use of accessory muscles of respiration.  CARDIOVASCULAR: S1, S2 normal. No murmurs, rubs, or gallops.  ABDOMEN: Soft, non-tender, non-distended. Bowel sounds present. No organomegaly or mass.  EXTREMITIES: No pedal edema, cyanosis, or clubbing.  PSYCHIATRIC: The patient is alert and oriented x 3.  SKIN: No obvious rash, lesion, or ulcer.   DATA REVIEW:   CBC  Recent Labs Lab 04/23/17 0433  WBC 6.3  HGB 13.9  HCT 39.9*  PLT 217    Chemistries   Recent Labs Lab 04/23/17 0433  NA 140  K 4.0  CL 108  CO2 26  GLUCOSE 100*  BUN 24*  CREATININE 1.30*  CALCIUM 9.1    Cardiac Enzymes  Recent Labs Lab 04/22/17 1709  TROPONINI <0.03    Microbiology Results  @[email or web address] @  RADIOLOGY:  Dg Chest 1 View  Result Date: 04/23/2017 CLINICAL DATA:  Follow-up right-sided pleural effusion. EXAM: CHEST 1 VIEW COMPARISON:  Chest radiograph performed 04/22/2017 FINDINGS: A small right pleural effusion is again noted. Right basilar airspace opacity may reflect atelectasis or mild pneumonia. There is elevation of the right hemidiaphragm. The left lung appears relatively clear. No pneumothorax is seen. The cardiomediastinal silhouette is normal in size. No acute osseous abnormalities are identified. IMPRESSION: Small right pleural effusion again  noted. Right basilar airspace opacity may reflect atelectasis or mild pneumonia. Elevation of the right hemidiaphragm. Electronically Signed   By: Garald Balding M.D.   On: 04/23/2017 02:19   Dg Chest 2 View  Result Date: 04/22/2017 CLINICAL DATA:  Shortness of breath, cough with yellow sputum for 2 days, hypertension, diabetes mellitus, former smoker, history stroke, former smoker EXAM: CHEST  2 VIEW COMPARISON:  None. FINDINGS: Normal heart size, mediastinal contours, and pulmonary vascularity. RIGHT pleural effusion and basilar atelectasis. Minimal central peribronchial thickening. Remaining lungs clear. No pleural effusion or pneumothorax. Bones unremarkable. IMPRESSION: Minimal bronchitic changes with RIGHT pleural effusion and basilar atelectasis. No acute abnormalities. Electronically Signed   By: Lavonia Dana M.D.   On: 04/22/2017 17:38      Current Discharge Medication List    START taking these medications   Details  levofloxacin (LEVAQUIN) 750 MG tablet Take 1 tablet (750 mg total) by mouth daily. Qty: 4 tablet, Refills: 0      CONTINUE these medications which have NOT CHANGED   Details  amLODipine (NORVASC) 5 MG tablet Take 5 mg by mouth daily.    aspirin 81 MG tablet Take 81 mg by  mouth daily.    clopidogrel (PLAVIX) 75 MG tablet Take 75 mg by mouth daily with breakfast.     Liraglutide (VICTOZA [place]) Inject 1.2 mg into the skin daily.     lisinopril (PRINIVIL,ZESTRIL) 10 MG tablet Take 10 mg by mouth daily.     metoprolol succinate (TOPROL-XL) 25 MG 24 hr tablet Take 25 mg by mouth daily.     pantoprazole (PROTONIX) 40 MG tablet Take 40 mg by mouth daily.     pravastatin (PRAVACHOL) 40 MG tablet Take 40 mg by mouth daily.            Management plans discussed with the patient and he is in agreement. Stable for discharge home  Patient should follow up with pcp  CODE STATUS:     Code Status Orders        Start     Ordered   04/23/17 0134  Full code   Continuous     04/23/17 0134    Code Status History    Date Active Date Inactive Code Status Order ID Comments User Context   This patient has a current code status but no historical code status.      TOTAL TIME TAKING CARE OF THIS PATIENT: 37 minutes.    Note: This dictation was prepared with Dragon dictation along with smaller phrase technology. Any transcriptional errors that result from this process are unintentional.  Muhammad Vacca M.D on 04/23/2017 at 9:38 AM  Between 7am to 6pm - Pager - [phone] After 6pm go to [email or web address] - password [email or web address] [place] [facility]  Office  [phone]  CC: Primary care physician; Derinda Late, MD

## 2017-04-23 NOTE — ED Notes (Signed)
Updated patient waiting on room assignment. Informed pt per Hugelmeyer, MD patient is to stay NPO tonight.

## 2017-04-23 NOTE — ED Notes (Signed)
Informed pt will be waiting on room assignment. Pt verbalized understanding. Pt denies any complaints at this time.

## 2017-04-23 NOTE — ED Notes (Signed)
Pt transported to room 250

## 2017-04-23 NOTE — Progress Notes (Signed)
*  PRELIMINARY RESULTS* Echocardiogram 2D Echocardiogram has been performed.  Tariq, Pernell 04/23/2017, 9:47 AM

## 2017-04-23 NOTE — Progress Notes (Signed)
Pharmacy Antibiotic Note  Ian Shaffer is a 71 y.o. male admitted on 04/22/2017 with CAP.  Pharmacy has been consulted for levofloxacin dosing.  Plan: Levofloxacin 750 mg IV x 1 followed by levofloxacin 750 mg PO Q24H.   Height: 5\' 10"  (177.8 cm) Weight: 212 lb 1.6 oz (96.2 kg) IBW/kg (Calculated) : 73  Temp (24hrs), Avg:98.1 F (36.7 C), Min:97.8 F (36.6 C), Max:98.4 F (36.9 C)   Recent Labs Lab 04/22/17 1709 04/23/17 0433  WBC 7.8 6.3  CREATININE 1.48* 1.30*    Estimated Creatinine Clearance: 59.8 mL/min (A) (by C-G formula based on SCr of 1.3 mg/dL (H)).    Allergies  Allergen Reactions  . Amoxicillin Nausea Only  . Sulfa Antibiotics Swelling    Thank you for allowing pharmacy to be a part of this patient's care.  Laural Benes, Pharm.D., BCPS Clinical Pharmacist 04/23/2017 9:04 AM

## 2017-05-07 ENCOUNTER — Encounter: Payer: Self-pay | Admitting: Family

## 2017-05-09 DIAGNOSIS — M19019 Primary osteoarthritis, unspecified shoulder: Secondary | ICD-10-CM

## 2017-05-09 HISTORY — DX: Primary osteoarthritis, unspecified shoulder: M19.019

## 2017-05-12 ENCOUNTER — Other Ambulatory Visit: Payer: Self-pay

## 2017-05-12 DIAGNOSIS — I739 Peripheral vascular disease, unspecified: Principal | ICD-10-CM

## 2017-05-12 DIAGNOSIS — I779 Disorder of arteries and arterioles, unspecified: Secondary | ICD-10-CM

## 2017-05-19 ENCOUNTER — Ambulatory Visit (HOSPITAL_COMMUNITY): Payer: Medicare Other

## 2017-05-19 ENCOUNTER — Ambulatory Visit: Payer: Medicare Other | Admitting: Family

## 2017-05-26 ENCOUNTER — Ambulatory Visit: Payer: Medicare Other | Attending: Otolaryngology

## 2017-05-26 ENCOUNTER — Other Ambulatory Visit: Payer: Self-pay | Admitting: Specialist

## 2017-05-26 DIAGNOSIS — G4733 Obstructive sleep apnea (adult) (pediatric): Secondary | ICD-10-CM | POA: Insufficient documentation

## 2017-05-26 DIAGNOSIS — Z8673 Personal history of transient ischemic attack (TIA), and cerebral infarction without residual deficits: Secondary | ICD-10-CM | POA: Diagnosis not present

## 2017-05-26 DIAGNOSIS — J984 Other disorders of lung: Secondary | ICD-10-CM | POA: Diagnosis not present

## 2017-05-26 DIAGNOSIS — I2510 Atherosclerotic heart disease of native coronary artery without angina pectoris: Secondary | ICD-10-CM | POA: Insufficient documentation

## 2017-05-26 DIAGNOSIS — Z6830 Body mass index (BMI) 30.0-30.9, adult: Secondary | ICD-10-CM | POA: Diagnosis not present

## 2017-05-26 DIAGNOSIS — J90 Pleural effusion, not elsewhere classified: Secondary | ICD-10-CM

## 2017-05-26 DIAGNOSIS — I10 Essential (primary) hypertension: Secondary | ICD-10-CM | POA: Diagnosis not present

## 2017-05-26 DIAGNOSIS — R0683 Snoring: Secondary | ICD-10-CM | POA: Insufficient documentation

## 2017-05-26 DIAGNOSIS — G4719 Other hypersomnia: Secondary | ICD-10-CM | POA: Diagnosis not present

## 2017-05-26 DIAGNOSIS — I493 Ventricular premature depolarization: Secondary | ICD-10-CM | POA: Diagnosis not present

## 2017-05-26 DIAGNOSIS — E669 Obesity, unspecified: Secondary | ICD-10-CM | POA: Insufficient documentation

## 2017-05-30 ENCOUNTER — Ambulatory Visit
Admission: RE | Admit: 2017-05-30 | Discharge: 2017-05-30 | Disposition: A | Discharge status: Home / Self Care | Payer: Medicare Other | Source: Ambulatory Visit | Attending: Specialist | Admitting: Specialist

## 2017-05-30 ENCOUNTER — Encounter: Payer: Self-pay | Admitting: Vascular Surgery

## 2017-05-30 ENCOUNTER — Ambulatory Visit
Admission: RE | Admit: 2017-05-30 | Discharge: 2017-05-30 | Disposition: A | Discharge status: Home / Self Care | Payer: Medicare Other | Source: Ambulatory Visit | Attending: Student | Admitting: Student

## 2017-05-30 DIAGNOSIS — R918 Other nonspecific abnormal finding of lung field: Secondary | ICD-10-CM | POA: Insufficient documentation

## 2017-05-30 DIAGNOSIS — Z9889 Other specified postprocedural states: Secondary | ICD-10-CM

## 2017-05-30 DIAGNOSIS — J90 Pleural effusion, not elsewhere classified: Secondary | ICD-10-CM | POA: Insufficient documentation

## 2017-05-30 LAB — LACTATE DEHYDROGENASE, PLEURAL OR PERITONEAL FLUID: LD FL: 501 U/L — AB (ref 3–23)

## 2017-05-30 LAB — PROTEIN, PLEURAL OR PERITONEAL FLUID: TOTAL PROTEIN, FLUID: 4.6 g/dL

## 2017-05-30 LAB — BODY FLUID CELL COUNT WITH DIFFERENTIAL
Eos, Fluid: 0 %
LYMPHS FL: 91 %
Monocyte-Macrophage-Serous Fluid: 6 %
Neutrophil Count, Fluid: 3 %
Total Nucleated Cell Count, Fluid: 419 cu mm

## 2017-05-30 LAB — GLUCOSE, PLEURAL OR PERITONEAL FLUID: Glucose, Fluid: 180 mg/dL

## 2017-05-30 NOTE — Procedures (Signed)
PROCEDURE SUMMARY:  Successful US guided right thoracentesis. Yielded 1.5 liters of amber fluid. Pt tolerated procedure well. No immediate complications.  Specimen was sent for labs. CXR ordered.  Docia Barrier PA-C 05/30/2017 10:38 AM

## 2017-05-31 LAB — ACID FAST SMEAR (AFB, MYCOBACTERIA)

## 2017-05-31 LAB — ACID FAST SMEAR (AFB): ACID FAST SMEAR - AFSCU2: NEGATIVE

## 2017-06-02 LAB — CYTOLOGY - NON PAP

## 2017-06-11 ENCOUNTER — Encounter (HOSPITAL_COMMUNITY): Payer: Medicare Other

## 2017-06-11 ENCOUNTER — Ambulatory Visit: Payer: Medicare Other | Admitting: Family

## 2017-06-11 ENCOUNTER — Other Ambulatory Visit: Payer: Self-pay | Admitting: Specialist

## 2017-06-11 DIAGNOSIS — J90 Pleural effusion, not elsewhere classified: Secondary | ICD-10-CM

## 2017-06-12 ENCOUNTER — Ambulatory Visit
Admission: RE | Admit: 2017-06-12 | Discharge: 2017-06-12 | Disposition: A | Discharge status: Home / Self Care | Payer: Medicare Other | Source: Ambulatory Visit | Attending: Specialist | Admitting: Specialist

## 2017-06-12 ENCOUNTER — Other Ambulatory Visit: Payer: Self-pay | Admitting: Specialist

## 2017-06-12 DIAGNOSIS — J90 Pleural effusion, not elsewhere classified: Secondary | ICD-10-CM | POA: Insufficient documentation

## 2017-06-12 DIAGNOSIS — Z9889 Other specified postprocedural states: Secondary | ICD-10-CM

## 2017-06-12 LAB — GLUCOSE, PLEURAL OR PERITONEAL FLUID: Glucose, Fluid: 165 mg/dL

## 2017-06-12 LAB — PROTEIN, PLEURAL OR PERITONEAL FLUID: Total protein, fluid: 4.5 g/dL

## 2017-06-12 LAB — BODY FLUID CELL COUNT WITH DIFFERENTIAL
Eos, Fluid: 3 %
Lymphs, Fluid: 91 %
Monocyte-Macrophage-Serous Fluid: 6 %
Neutrophil Count, Fluid: 0 %
OTHER CELLS FL: 0 %
Total Nucleated Cell Count, Fluid: 994 cu mm

## 2017-06-12 LAB — AMYLASE, PLEURAL OR PERITONEAL FLUID: Amylase, Fluid: 44 U/L

## 2017-06-12 LAB — LACTATE DEHYDROGENASE, PLEURAL OR PERITONEAL FLUID: LD FL: 458 U/L — AB (ref 3–23)

## 2017-06-13 ENCOUNTER — Ambulatory Visit: Admission: RE | Admit: 2017-06-13 | Payer: Medicare Other | Source: Ambulatory Visit

## 2017-06-13 LAB — ACID FAST SMEAR (AFB, MYCOBACTERIA)

## 2017-06-13 LAB — ACID FAST SMEAR (AFB): ACID FAST SMEAR - AFSCU2: NEGATIVE

## 2017-06-15 LAB — CHOLESTEROL, BODY FLUID: Cholesterol, Fluid: 88 mg/dL

## 2017-06-15 LAB — BODY FLUID CULTURE: CULTURE: NO GROWTH

## 2017-06-15 LAB — CYTOLOGY - NON PAP

## 2017-06-17 ENCOUNTER — Other Ambulatory Visit: Payer: Self-pay

## 2017-06-17 ENCOUNTER — Encounter: Payer: Self-pay | Admitting: Vascular Surgery

## 2017-06-18 ENCOUNTER — Encounter: Payer: Self-pay | Admitting: Cardiothoracic Surgery

## 2017-06-18 ENCOUNTER — Ambulatory Visit
Admission: RE | Admit: 2017-06-18 | Discharge: 2017-06-18 | Disposition: A | Discharge status: Home / Self Care | Payer: Medicare Other | Source: Ambulatory Visit | Attending: Cardiothoracic Surgery | Admitting: Cardiothoracic Surgery

## 2017-06-18 ENCOUNTER — Ambulatory Visit (INDEPENDENT_AMBULATORY_CARE_PROVIDER_SITE_OTHER): Payer: Medicare Other | Admitting: Cardiothoracic Surgery

## 2017-06-18 ENCOUNTER — Telehealth: Payer: Self-pay

## 2017-06-18 VITALS — BP 139/87 | HR 97 | Temp 98.2°F | Resp 14 | Ht 70.0 in | Wt 207.2 lb

## 2017-06-18 DIAGNOSIS — J90 Pleural effusion, not elsewhere classified: Secondary | ICD-10-CM | POA: Diagnosis not present

## 2017-06-18 DIAGNOSIS — J986 Disorders of diaphragm: Secondary | ICD-10-CM | POA: Diagnosis not present

## 2017-06-18 DIAGNOSIS — J9811 Atelectasis: Secondary | ICD-10-CM | POA: Insufficient documentation

## 2017-06-18 LAB — POCT I-STAT CREATININE: CREATININE: 1.4 mg/dL — AB (ref 0.61–1.24)

## 2017-06-18 MED ORDER — IOPAMIDOL (ISOVUE-300) INJECTION 61%
75.0000 mL | Freq: Once | INTRAVENOUS | Status: AC | PRN
  Administered 2017-06-18: 75 mL via INTRAVENOUS

## 2017-06-18 NOTE — Telephone Encounter (Signed)
Spoke with Dr. Genevive Bi at this time. He would like to schedule patient in the office for Monday, 7/30 at 0800am to be seen and discuss Thoracoscopy with Pleurodesis and Pleurx Placement later in the week. He has asked that this be added to OR schedule and Pre-op be scheduled as well. Dr. Genevive Bi has requested Cardiac Clearance be obtained prior to his surgery.  Patient does not have a preference of Cardiologist but would like to be seen ASAP for surgery clearance. Call made to [facility]. Appointment has been made for tomorrow, 7/26 at 1130am with Dr. Saralyn Pilar at [facility]. Clearances have been faxed Attn: Sandy with positive confirmation.  Booking Sheet has been sent to OR for date of 06/30/17 for surgery.  When calling back to give patient all information above. He states that he would indeed like to come back in and speak with Dr. Genevive Bi on Friday, 06/20/17 and go ahead with surgery. He verbalizes understanding of all information and readback all appointment info.

## 2017-06-18 NOTE — Telephone Encounter (Signed)
Spoke with Shawn from CT at this time. Verbal report given to myself of CT results.  Call made to Dr. Genevive Bi at this time. No answer. Left voicemail for return phone call.

## 2017-06-18 NOTE — Patient Instructions (Addendum)
Go to the [facility] as soon as you are done here for your Thoracentesis and CT to follow. Nothing to eat or drink prior. Please check in at the medical mall registration desk.  Directions to Medical Mall: When leaving our office, go right. Go all of the way down to the very end of the hallway. You will have a purple wall in front of you. You will now have a tunnel to the hospital on your left hand side. Go through this tunnel and the elevators will be on your left. Go down to the 1st floor and take a slight left. The very first desk on the right hand side is the registration desk.    Thoracentesis A thoracentesis is a procedure to remove fluid that has built up in the space between the linings of the chest wall and the lungs (pleural space). It is normal to have a small amount of fluid in the pleural space. Some medical conditions, such as heart failure, pneumonia, kidney problems, or cancer, can create too much fluid. This extra fluid is removed using a needle that is inserted through the skin and tissue and into the pleural space. A thoracentesis may be done to:  Understand why there is extra fluid in the pleural space and create a treatment plan that is right for you.  Help to get rid of shortness of breath, discomfort, or pain that is caused by the extra fluid.  Tell a health care provider about:  Any allergies you have.  All medicines you are taking, including vitamins, herbs, eye drops, creams, and over-the-counter medicines. This includes any use of steroids, either by mouth or in a cream.  Any problems you or family members have had with anesthetic medicines.  Any blood disorders you have, including any history of blood clots.  Any surgeries you have had.  Medical conditions you have, including: ? The possibility of pregnancy, if this applies. ? Have a frequent cough or coughing episodes. What are the risks? Generally, this is a safe procedure. However, problems may occur,  including:  Infection.  Injury to the lung.  Lung collapse.  Bleeding.  What happens before the procedure?  You may have a chest X-ray or another imaging test, such as a CT scan or ultrasound, to determine the location and amount of fluid in your pleural space.  Ask your health care provider about: ? Changing or stopping your regular medicines. This is especially important if you are taking diabetes medicines or blood thinners. ? Taking medicines such as aspirin and ibuprofen. These medicines can thin your blood. Do not take these medicines before your procedure if your health care provider instructs you not to. ? Taking a cough suppressant if you have a frequent cough or coughing episodes.  Plan to have someone take you home after the procedure. What happens during the procedure?  You will be asked to sit upright and lean slightly forward for the procedure.  An area of your back will be cleaned with a germ-killing solution (antiseptic).  You will be given a medicine that numbs the area (local anesthetic).  A needle will be inserted between your ribs and into the pleural space. You may feel pressure or slight pain as the needle is positioned into the pleural space.  Fluid will be removed from the pleural space through the needle. You may feel pressure as the fluid is removed.  The needle will be taken out after the excess fluid has been removed. A sample  of the fluid may be sent to be examined.  The needle insertion site (puncture site) will be covered with a bandage (dressing). The procedure may vary among health care providers and hospitals. What happens after the procedure?  A chest X-ray may be done to check the amount of fluid that remains in your pleural space.  Your blood pressure, heart rate, breathing rate, and blood oxygen level will be monitored often until the medicines you were given have worn off.  It is your responsibility to obtain your test results. Ask the  lab or department performing the test when and how you will get your results. Talk with your health care provider if you have any questions about your results. This information is not intended to replace advice given to you by your health care provider. Make sure you discuss any questions you have with your health care provider. Document Released: 05/27/2005 Document Revised: 07/13/2016 Document Reviewed: 08/23/2014 Elsevier Interactive Patient Education  [employer].

## 2017-06-18 NOTE — Telephone Encounter (Signed)
Dr. Genevive Bi returned phone call at this time. All results of Thoracentesis and CT Scan given to him over the phone. He is requesting to see the patient on Friday am at 0800.  Call made to patient. He states that he will be out of town in the early morning hours of Friday and cannot make a 0800 appointment.  Returned call to Dr. Genevive Bi- awaiting further instructions at this time.

## 2017-06-18 NOTE — Progress Notes (Signed)
Patient ID: Ian Shaffer, male   DOB: 09/02/1946, 71 y.o.   MRN: [id number]  Chief Complaint  Patient presents with  . New Patient (Initial Visit)    -pleural effusion--referred by Dr Delton See    Referred By Dr. Rudene Christians Reason for Referral Recurrent right pleural effusion  HPI Location, Quality, Duration, Severity, Timing, Context, Modifying Factors, Associated Signs and Symptoms.  Ian Shaffer is a 71 y.o. male.  He was admitted to the hospital back in May of this year with shortness of breath. He was in his usual state of health which includes some chronic sinus issues but over the course of several days began developing increasing shortness of breath and presented to the urgent care center at the current nodal clinic. Physical examination revealed diminished breath sounds at the right as well as PVCs and the patient was sent to the emergency room and subsequently admitted to the hospital for workup. That brief hospital stay resulted in a diagnosis of community-acquired pneumonia and he was treated with Levaquin with some improvement. However his shortness of breath returned and he ultimately underwent a ultrasound-guided thoracentesis. He felt significantly better after that and several weeks later had to have another thoracentesis for recurrent shortness of breath. He states that he denies any fevers or chills. He denied any hemoptysis. He does have a chronic cough and he relates this to sinus issues that he's had for many years. He has not had any weight loss. 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 He is on aspirin and Plavix for that. He follows a vascular surgeon in [place] and is scheduled to have an ultrasound of his neck in the near future. Currently he states that he is able to do the activities of daily living without significant limitations. However moderate exertion does make him short  of breath. He is able to lie flat and once he gets in a comfortable position can sleep the entire night. Before his last thoracentesis he had to sit up and sleep in a chair for shortness of breath reasons.   Past Medical History:  Diagnosis Date  . Benign essential hypertension 05/31/2014  . Cerebral infarction ([place]) 05/31/2014  . Cerebrovascular disease 05/31/2014  . CKD (chronic kidney disease) stage 3, GFR 30-59 ml/min 02/23/2016  . CVA (cerebral infarction)   . Diabetes mellitus without complication ([place])   . DVT (deep venous thrombosis) ([place])   . Esophageal reflux 05/31/2014  . H/O: CVA (cerebrovascular accident)   . Hyperlipidemia   . Hypertension   . ICAO (internal carotid artery occlusion) March 01, 2014   Left  . 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 Pleural effusion 04/23/2017  . Stroke [facility]) April, 4,2015  . Type 2 diabetes mellitus with peripheral neuropathy ([place]) 02/23/2016  . Weakness     Past Surgical History:  Procedure Laterality Date  . NASAL SINUS SURGERY  2000  . TONSILLECTOMY      Family History  Problem Relation Age of Onset  . Hypertension Mother   . Hypertension Father     Social History Social History  Substance Use Topics  . Smoking status: Former Smoker    Types: Pipe    Quit date: 05/07/1974  . Smokeless tobacco: Never Used     Comment: pt states he smoked a pipe a long time ago  . Alcohol use No    Allergies  Allergen Reactions  .  Amoxicillin Nausea Only  . No Known Allergies   . Sulfa Antibiotics Swelling    Current Outpatient Prescriptions  Medication Sig Dispense Refill  . amLODipine (NORVASC) 5 MG tablet amlodipine 5 mg tablet    . aspirin 81 MG tablet Take 81 mg by mouth daily.    . clopidogrel (PLAVIX) 75 MG tablet clopidogrel 75 mg tablet    . Liraglutide (VICTOZA [place]) Inject 1.2 mg into the skin daily.     Marland Kitchen lisinopril (PRINIVIL,ZESTRIL) 10 MG tablet lisinopril 10 mg tablet    . metoprolol succinate  (TOPROL-XL) 25 MG 24 hr tablet metoprolol succinate ER 25 mg tablet,extended release 24 hr    . pantoprazole (PROTONIX) 40 MG tablet pantoprazole 40 mg tablet,delayed release    . pravastatin (PRAVACHOL) 40 MG tablet pravastatin 40 mg tablet     No current facility-administered medications for this visit.       Review of Systems A complete review of systems was asked and was negative except for the following positive findings Shortness of breath with exertion  Blood pressure 139/87, pulse 97, temperature 98.2 F (36.8 C), temperature source Oral, resp. rate 14, height 5\' 10"  (1.778 m), weight 207 lb 3.2 oz (94 kg), SpO2 97 %.  Physical Exam CONSTITUTIONAL:  Pleasant, well-developed, well-nourished, and in no acute distress. EYES: Pupils equal and reactive to light, Sclera non-icteric EARS, NOSE, MOUTH AND THROAT:  The oropharynx was clear.  Dentition is good repair.  Oral mucosa pink and moist. LYMPH NODES:  Lymph nodes in the neck and axillae were normal RESPIRATORY:  Lungs were markedly decreased on the right.  Normal respiratory effort without pathologic use of accessory muscles of respiration CARDIOVASCULAR: Heart was regular without murmurs.  There were no carotid bruits. GI: The abdomen was soft, nontender, and nondistended. There were no palpable masses. There was no hepatosplenomegaly. There were normal bowel sounds in all quadrants. GU:  Rectal deferred.   MUSCULOSKELETAL:  Normal muscle strength and tone.  No clubbing or cyanosis.   SKIN:  There were no pathologic skin lesions.  There were no nodules on palpation. NEUROLOGIC:  Sensation is normal.  Cranial nerves are grossly intact. PSYCH:  Oriented to person, place and time.  Mood and affect are normal.   Data Reviewed CXRay  I have personally reviewed the patient's imaging, laboratory findings and medical records.    Assessment    I believe this patient has a recurrent right-sided pleural effusion. I have reviewed the  etiologies with him. His pleural fluid analysis was negative for malignancy and although was high and white blood cells most of these were neutrophils with very few polymorphonuclear leukocytes.    Plan    I had a long discussion with him regarding the options. At this point in time I would like pursue a thoracentesis followed by chest CT. Once that's complete we can then make further recommendations. I did review with him in great detail the indications and risks of thoracoscopy with pleural biopsy and talc pleurodesis. I also discussed with him the use of our Pleurx catheter and how that may be utilized. At the end of the visit he had all his questions answered.        Nestor Lewandowsky, MD 06/18/2017, 9:57 AM

## 2017-06-19 ENCOUNTER — Telehealth: Payer: Self-pay | Admitting: Cardiothoracic Surgery

## 2017-06-19 LAB — CYTOLOGY - NON PAP

## 2017-06-19 NOTE — Telephone Encounter (Signed)
Pt advised of pre op date/time and sx date. Sx: 06/30/17 with Dr Greggory Brandy thoracoscopy with pleurodesis and pleurx catheter insertion.  Pre op: 06/23/17 @ 10:15am.--office.   Patient made aware to call [phone], between 1-3:00pm the Friday before surgery, to find out what time to arrive.

## 2017-06-20 ENCOUNTER — Encounter: Payer: Self-pay | Admitting: Cardiothoracic Surgery

## 2017-06-20 ENCOUNTER — Telehealth: Payer: Self-pay

## 2017-06-20 ENCOUNTER — Ambulatory Visit (INDEPENDENT_AMBULATORY_CARE_PROVIDER_SITE_OTHER): Payer: Medicare Other | Admitting: Cardiothoracic Surgery

## 2017-06-20 VITALS — BP 133/82 | HR 94 | Temp 98.0°F | Resp 20 | Ht 70.0 in | Wt 202.4 lb

## 2017-06-20 DIAGNOSIS — J90 Pleural effusion, not elsewhere classified: Secondary | ICD-10-CM

## 2017-06-20 NOTE — Patient Instructions (Addendum)
We will see you 06/30/17 for your procedure at the Hospital. If you feel the fluid is collecting or you have short of breath please call our office. If this occurs over the weekend go to the emergency room.  Today we have discussed surgery for your Lung mass. This surgery is scheduled at [facility] on 06/30/17 with Dr. Genevive Bi.  Please refer to your [facility]) Pre-care sheet for further information.

## 2017-06-20 NOTE — Telephone Encounter (Signed)
Clearance from Dr. Saralyn Pilar has been obtained and is in his note in Care Everywhere on 06/19/17. He advises to proceed with procedure as planned and to stop ASA 3 days prior and Plavix 5 days prior to surgery.

## 2017-06-20 NOTE — Telephone Encounter (Signed)
Pleurx enrollment form faxed at this time.  Referral to [facility] faxed at this time. Message sent to Summer (Nurse with [facility]). She will handle patient's case from here.

## 2017-06-20 NOTE — Telephone Encounter (Signed)
Spoke with OR. Surgery moved to 06/25/17 with Dr. Genevive Bi.  Call was made to patient. I explained that surgery was moved and that he is stop hold aspirin for 3 days and plavix for 5 days. He is very thankful for the timely service.

## 2017-06-20 NOTE — Telephone Encounter (Signed)
Dr. Genevive Bi has asked for patient's surgery date to be changed due to the urgency of his Pleural Effusion. He would like case to be moved to 06/25/17 at 1200. Call made to OR at this time. Awaiting return phone call.

## 2017-06-20 NOTE — Progress Notes (Signed)
  Patient ID: Ian Shaffer, male   DOB: Oct 24, 1946, 71 y.o.   MRN: [id number]  HISTORY: Mr. Kinker returns today in follow-up. He underwent an ultrasound-guided thoracentesis and a CT scan last week. He states that his breathing is much improved after his thoracentesis involving 2.2 L of fluid. He's able to walk up a flight of steps now. He has no new problems.   Vitals:   06/20/17 0813  BP: 133/82  Pulse: 94  Resp: 20  Temp: 98 F (36.7 C)     EXAM:    Resp: Lungs are clear bilaterally With the exception of slightly diminished breath sounds at the right base..  No respiratory distress, normal effort. Heart:  Regular without murmurs Abd:  Abdomen is soft, non distended and non tender. No masses are palpable.  There is no rebound and no guarding.  Neurological: Alert and oriented to person, place, and time. Coordination normal.  Skin: Skin is warm and dry. No rash noted. No diaphoretic. No erythema. No pallor.  Psychiatric: Normal mood and affect. Normal behavior. Judgment and thought content normal.    ASSESSMENT: I have independently reviewed the patient's CT scan. 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 The differential includes mesothelioma, sarcoma or lymphoma.   PLAN:   I had a long discussion with him regarding the results of the CT. He understands that I believe this is an unresectable lesion and that his current problem is pleural effusion. I reviewed with him in detail the indications and risks of thoracoscopy with possible thoracotomy and talc pleurodesis following biopsy of the mass or pleura. He understands it may also place a Pleurx catheter. He also understands that we may not be able to above procedure with a thoracoscopy alone and that we may need to pursue a percutaneous approach if need be. All in all he had his questions answered today. I discussed with him the need to stop his aspirin and Plavix preoperatively.  We will attempt to get clearance for that today.   Nestor Lewandowsky, MD

## 2017-06-21 LAB — BODY FLUID CULTURE: Culture: NO GROWTH

## 2017-06-23 ENCOUNTER — Ambulatory Visit
Admission: RE | Admit: 2017-06-23 | Discharge: 2017-06-23 | Disposition: A | Discharge status: Home / Self Care | Payer: Medicare Other | Source: Ambulatory Visit | Attending: Cardiothoracic Surgery | Admitting: Cardiothoracic Surgery

## 2017-06-23 ENCOUNTER — Ambulatory Visit (INDEPENDENT_AMBULATORY_CARE_PROVIDER_SITE_OTHER): Payer: Medicare Other | Admitting: Cardiothoracic Surgery

## 2017-06-23 ENCOUNTER — Ambulatory Visit
Admission: RE | Admit: 2017-06-23 | Discharge: 2017-06-23 | Disposition: A | Discharge status: Home / Self Care | Payer: Medicare Other | Source: Ambulatory Visit | Attending: Interventional Radiology | Admitting: Interventional Radiology

## 2017-06-23 ENCOUNTER — Telehealth: Payer: Self-pay

## 2017-06-23 ENCOUNTER — Other Ambulatory Visit: Payer: Self-pay

## 2017-06-23 ENCOUNTER — Encounter: Payer: Self-pay | Admitting: Cardiothoracic Surgery

## 2017-06-23 VITALS — BP 130/85 | HR 89 | Temp 98.1°F | Wt 202.0 lb

## 2017-06-23 DIAGNOSIS — J90 Pleural effusion, not elsewhere classified: Secondary | ICD-10-CM

## 2017-06-23 DIAGNOSIS — Z01810 Encounter for preprocedural cardiovascular examination: Secondary | ICD-10-CM | POA: Insufficient documentation

## 2017-06-23 DIAGNOSIS — Z79899 Other long term (current) drug therapy: Secondary | ICD-10-CM

## 2017-06-23 DIAGNOSIS — I493 Ventricular premature depolarization: Secondary | ICD-10-CM | POA: Insufficient documentation

## 2017-06-23 DIAGNOSIS — E1122 Type 2 diabetes mellitus with diabetic chronic kidney disease: Secondary | ICD-10-CM

## 2017-06-23 DIAGNOSIS — N183 Chronic kidney disease, stage 3 (moderate): Secondary | ICD-10-CM | POA: Insufficient documentation

## 2017-06-23 DIAGNOSIS — I491 Atrial premature depolarization: Secondary | ICD-10-CM | POA: Insufficient documentation

## 2017-06-23 DIAGNOSIS — E1140 Type 2 diabetes mellitus with diabetic neuropathy, unspecified: Secondary | ICD-10-CM | POA: Insufficient documentation

## 2017-06-23 DIAGNOSIS — Z9889 Other specified postprocedural states: Secondary | ICD-10-CM | POA: Insufficient documentation

## 2017-06-23 DIAGNOSIS — Z7982 Long term (current) use of aspirin: Secondary | ICD-10-CM | POA: Insufficient documentation

## 2017-06-23 DIAGNOSIS — I129 Hypertensive chronic kidney disease with stage 1 through stage 4 chronic kidney disease, or unspecified chronic kidney disease: Secondary | ICD-10-CM | POA: Insufficient documentation

## 2017-06-23 DIAGNOSIS — Z7902 Long term (current) use of antithrombotics/antiplatelets: Secondary | ICD-10-CM | POA: Insufficient documentation

## 2017-06-23 DIAGNOSIS — R918 Other nonspecific abnormal finding of lung field: Secondary | ICD-10-CM

## 2017-06-23 LAB — COMPREHENSIVE METABOLIC PANEL
ALK PHOS: 80 U/L (ref 38–126)
ALT: 18 U/L (ref 17–63)
AST: 18 U/L (ref 15–41)
Albumin: 3.7 g/dL (ref 3.5–5.0)
Anion gap: 10 (ref 5–15)
BILIRUBIN TOTAL: 0.4 mg/dL (ref 0.3–1.2)
BUN: 21 mg/dL — AB (ref 6–20)
CALCIUM: 9.3 mg/dL (ref 8.9–10.3)
CO2: 24 mmol/L (ref 22–32)
CREATININE: 1.32 mg/dL — AB (ref 0.61–1.24)
Chloride: 103 mmol/L (ref 101–111)
GFR calc Af Amer: >60 mL/min (ref >60)
GFR, EST NON AFRICAN AMERICAN: 52 mL/min — AB (ref >60)
GLUCOSE: 129 mg/dL — AB (ref 65–99)
POTASSIUM: 4.6 mmol/L (ref 3.5–5.1)
Sodium: 137 mmol/L (ref 135–145)
TOTAL PROTEIN: 7.2 g/dL (ref 6.5–8.1)

## 2017-06-23 LAB — CBC
HEMATOCRIT: 42.8 % (ref 40.0–52.0)
HEMOGLOBIN: 14.7 g/dL (ref 13.0–18.0)
MCH: 30 pg (ref 26.0–34.0)
MCHC: 34.3 g/dL (ref 32.0–36.0)
MCV: 87.7 fL (ref 80.0–100.0)
Platelets: 326 10*3/uL (ref 150–440)
RBC: 4.88 MIL/uL (ref 4.40–5.90)
RDW: 12.2 % (ref 11.5–14.5)
WBC: 8.3 10*3/uL (ref 3.8–10.6)

## 2017-06-23 LAB — SURGICAL PCR SCREEN
MRSA, PCR: POSITIVE — AB
STAPHYLOCOCCUS AUREUS: POSITIVE — AB

## 2017-06-23 LAB — PROTIME-INR
INR: 1.05
PROTHROMBIN TIME: 13.7 s (ref 11.4–15.2)

## 2017-06-23 LAB — TYPE AND SCREEN
ABO/RH(D): A POS
Antibody Screen: NEGATIVE

## 2017-06-23 LAB — APTT: aPTT: 29 seconds (ref 24–36)

## 2017-06-23 NOTE — Pre-Procedure Instructions (Signed)
MRSA swab results positive for MRSA and Staph A.  Called and informed Angie at Dr.Oaks office, she will let Dr. Genevive Bi know via email.

## 2017-06-23 NOTE — Patient Instructions (Signed)
  Your procedure is scheduled on:Wednesday June 25, 2017. Report to Same Day Surgery. To find out your arrival time please call [phone] between 1PM - 3PM on Tuesday June 24, 2017.  Remember: Instructions that are not followed completely may result in serious medical risk, up to and including death, or upon the discretion of your surgeon and anesthesiologist your surgery may need to be rescheduled.    _x___ 1. Do not eat food or drink liquids after midnight. No gum chewing or hard candies.     ____ 2. No Alcohol for 24 hours before or after surgery.   ____ 3. Bring all medications with you on the day of surgery if instructed.    __x__ 4. Notify your doctor if there is any change in your medical condition     (cold, fever, infections).    _____ 5. No smoking 24 hours prior to surgery.     Do not wear jewelry, make-up, hairpins, clips or nail polish.  Do not wear lotions, powders, or perfumes.   Do not shave 48 hours prior to surgery. Men may shave face and neck.  Do not bring valuables to the hospital.    [facility] is not responsible for any belongings or valuables.               Contacts, dentures or bridgework may not be worn into surgery.  Leave your suitcase in the car. After surgery it may be brought to your room.  For patients admitted to the hospital, discharge time is determined by your treatment team.   Patients discharged the day of surgery will not be allowed to drive home.    Please read over the following fact sheets that you were given:   [facility] Preparing for Surgery  _x___ Take these medicines the morning of surgery with A SIP OF WATER:    1. amLODipine (NORVASC)  2. lisinopril (PRINIVIL,ZESTRIL)  3. metoprolol succinate (TOPROL-XL)   4. pantoprazole (PROTONIX)  5. pravastatin (PRAVACHOL)   6.  ____ Fleet Enema (as directed)   _x___ Use CHG Soap as directed on instruction sheet  ____ Use inhalers on the day of surgery and bring to hospital day  of surgery  ____ Stop metformin 2 days prior to surgery    ____ Take 1/2 of usual insulin dose the night before surgery and none on the morning of surgery.   _x___ Stopped Plavix on Saturday and aspirin on Sunday per  Dr[facility]s [facility] and [facility] instructions.  __x__ Stop Anti-inflammatories such as Advil, Aleve, Ibuprofen, Motrin, Naproxen, Naprosyn, Goodies powders or aspirin  products. OK to take Tylenol.   ____ Stop supplements until after surgery.    ____ Bring C-Pap to the hospital.

## 2017-06-23 NOTE — Telephone Encounter (Signed)
After speaking with Dr. Genevive Bi about patient's symptoms, he wanted the patient to go to his pre-admit appointment first and then to have his chest x-ray done and then come to our office to be seen by Dr. Genevive Bi.  Chest X-Ray had been ordered and appointment had been set up.  Called patient to let him know what Dr. Genevive Bi recommended and he stated that he would do as told.  We will see patient later on today.

## 2017-06-23 NOTE — Telephone Encounter (Signed)
Patient is scheduled for a video assisted Thoracoscopy with Pleurodesis on 06/25/17. His pre- admit appointment is today at 10:15. He is calling because he is having problems breathing and is wondering if he could get the fluid taken out today. Please call patient and advice.

## 2017-06-23 NOTE — Progress Notes (Signed)
  Patient ID: Ian Shaffer, male   DOB: 1946-06-13, 71 y.o.   MRN: [id number]  HISTORY: This patient came in today on an urgent basis. He states that he awoke this morning around 5:30 and was short of breath and could not give back to sleep. He feels some heaviness in the right side of his chest similar to prior whenever he had his large pleural effusion. I asked him to come in today for evaluation. He does have a cough whenever he is up moving around. He also states he gets short of breath when he is lying flat or with exertion.   Vitals:   06/23/17 1235  BP: 130/85  Pulse: 89  Temp: 98.1 F (36.7 C)     EXAM:    Resp: Lungs Show diminished breath sounds about halfway up the right chest..  No respiratory distress, normal effort. Heart:  Regular without murmurs Abd:  Abdomen is soft, non distended and non tender. No masses are palpable.  There is no rebound and no guarding.  Neurological: Alert and oriented to person, place, and time. Coordination normal.  Skin: Skin is warm and dry. No rash noted. No diaphoretic. No erythema. No pallor.  Psychiatric: Normal mood and affect. Normal behavior. Judgment and thought content normal.    ASSESSMENT: I have independently reviewed the patient's chest x-ray from today. There is a large right-sided pleural effusion.   PLAN:   I discussed this with the patient. He does not feel that he can wait until his surgical procedure later this week and would like something done. We'll go ahead and set him up for an ultrasound-guided thoracentesis today. He will come back on Wednesday for his surgical procedure.    Nestor Lewandowsky, MD

## 2017-06-23 NOTE — Procedures (Signed)
Rt pleural eff  S/p RT THORACENTESIS  2.5 L REMOVED  NO COMP STABLE FULL REPORT IN PACS CXR PENDING

## 2017-06-23 NOTE — Telephone Encounter (Signed)
I spoke with the the nurse about the patient. According to the nurse, we can not move the surgery up. He originally had it scheduled for 06/30/17 and it was moved up to 06/25/17. I called the patient and let him know that if he is feeling discomfort when he is breathing, he needs to go to the ER. The patient responded with "Do you know the amount of work that's behind that? I'd have to be in there for 12-14 hours". I apologized to him but let him know that it's his only option and if we could do something for him here, we would. He sighed and asked to speak with the nurse. I put him on hold for Maritza to pick up.

## 2017-06-24 ENCOUNTER — Telehealth: Payer: Self-pay

## 2017-06-24 MED ORDER — VANCOMYCIN HCL IN DEXTROSE 1-5 GM/200ML-% IV SOLN
1000.0000 mg | INTRAVENOUS | Status: AC
  Administered 2017-06-25: 1000 mg via INTRAVENOUS

## 2017-06-24 MED ORDER — MUPIROCIN CALCIUM 2 % NA OINT: 1.0000 "application " | TOPICAL_OINTMENT | Freq: Two times a day (BID) | NASAL | 0 refills | Status: DC

## 2017-06-24 NOTE — Telephone Encounter (Signed)
Called patient to let him know that he tested positive for MRSA, therefore, he needs to start using Bactroban Nasal twice a day for 5 days. I told patient that I would send the prescription to his pharmacy. Patient was told to bring it tomorrow since his surgery is tomorrow. Patient understood and had no further questions.

## 2017-06-24 NOTE — Pre-Procedure Instructions (Signed)
EKG reviewed by S. Fields RN, OK to proceed. 

## 2017-06-25 ENCOUNTER — Inpatient Hospital Stay: Payer: Medicare Other

## 2017-06-25 ENCOUNTER — Encounter
Admission: RE | Disposition: A | Discharge status: Home / Self Care | Payer: Self-pay | Source: Ambulatory Visit | Attending: Cardiothoracic Surgery

## 2017-06-25 ENCOUNTER — Inpatient Hospital Stay: Payer: Medicare Other | Admitting: Anesthesiology

## 2017-06-25 ENCOUNTER — Encounter: Payer: Self-pay | Admitting: *Deleted

## 2017-06-25 ENCOUNTER — Inpatient Hospital Stay
Admission: RE | Admit: 2017-06-25 | Discharge: 2017-06-27 | DRG: 830 | Disposition: A | Discharge status: Home / Self Care | Payer: Medicare Other | Source: Ambulatory Visit | Attending: Cardiothoracic Surgery | Admitting: Cardiothoracic Surgery

## 2017-06-25 DIAGNOSIS — J90 Pleural effusion, not elsewhere classified: Secondary | ICD-10-CM

## 2017-06-25 DIAGNOSIS — R918 Other nonspecific abnormal finding of lung field: Secondary | ICD-10-CM | POA: Diagnosis present

## 2017-06-25 DIAGNOSIS — C801 Malignant (primary) neoplasm, unspecified: Secondary | ICD-10-CM

## 2017-06-25 DIAGNOSIS — J910 Malignant pleural effusion: Secondary | ICD-10-CM | POA: Diagnosis present

## 2017-06-25 DIAGNOSIS — J918 Pleural effusion in other conditions classified elsewhere: Secondary | ICD-10-CM

## 2017-06-25 DIAGNOSIS — Z09 Encounter for follow-up examination after completed treatment for conditions other than malignant neoplasm: Secondary | ICD-10-CM

## 2017-06-25 HISTORY — DX: Malignant (primary) neoplasm, unspecified: C80.1

## 2017-06-25 HISTORY — DX: Failed or difficult intubation, initial encounter: T88.4XXA

## 2017-06-25 HISTORY — PX: CHEST TUBE INSERTION: SHX231

## 2017-06-25 HISTORY — PX: VIDEO ASSISTED THORACOSCOPY: SHX5073

## 2017-06-25 LAB — GLUCOSE, CAPILLARY
Glucose-Capillary: 159 mg/dL — ABNORMAL HIGH (ref 65–99)
Glucose-Capillary: 151 mg/dL — ABNORMAL HIGH (ref 65–99)

## 2017-06-25 LAB — CYTOLOGY - NON PAP

## 2017-06-25 SURGERY — VIDEO ASSISTED THORACOSCOPY
Anesthesia: General | Laterality: Right | Wound class: Clean Contaminated

## 2017-06-25 MED ORDER — ONDANSETRON HCL 4 MG/2ML IJ SOLN
INTRAMUSCULAR | Status: DC | PRN
  Administered 2017-06-25: 4 mg via INTRAVENOUS

## 2017-06-25 MED ORDER — AMLODIPINE BESYLATE 5 MG PO TABS
5.0000 mg | ORAL_TABLET | Freq: Every day | ORAL | Status: DC
  Administered 2017-06-27: 5 mg via ORAL
  Filled 2017-06-25 (×2): qty 1

## 2017-06-25 MED ORDER — LIDOCAINE HCL (CARDIAC) 20 MG/ML IV SOLN
INTRAVENOUS | Status: DC | PRN
  Administered 2017-06-25: 100 mg via INTRAVENOUS

## 2017-06-25 MED ORDER — ONDANSETRON HCL 4 MG/2ML IJ SOLN
4.0000 mg | Freq: Four times a day (QID) | INTRAMUSCULAR | Status: DC | PRN
  Administered 2017-06-26: 4 mg via INTRAVENOUS
  Filled 2017-06-25: qty 2

## 2017-06-25 MED ORDER — LIDOCAINE HCL (PF) 2 % IJ SOLN
INTRAMUSCULAR | Status: AC
  Filled 2017-06-25: qty 2

## 2017-06-25 MED ORDER — SUGAMMADEX SODIUM 200 MG/2ML IV SOLN
INTRAVENOUS | Status: AC
  Filled 2017-06-25: qty 2

## 2017-06-25 MED ORDER — MUPIROCIN 2 % EX OINT
TOPICAL_OINTMENT | Freq: Two times a day (BID) | CUTANEOUS | Status: DC
  Administered 2017-06-25 – 2017-06-27 (×3): via NASAL
  Filled 2017-06-25: qty 22

## 2017-06-25 MED ORDER — TRAMADOL HCL 50 MG PO TABS
50.0000 mg | ORAL_TABLET | Freq: Four times a day (QID) | ORAL | Status: DC | PRN
  Administered 2017-06-25: 50 mg via ORAL
  Administered 2017-06-26 (×2): 100 mg via ORAL
  Administered 2017-06-26: 50 mg via ORAL
  Administered 2017-06-27: 100 mg via ORAL
  Filled 2017-06-25: qty 1
  Filled 2017-06-25 (×2): qty 2
  Filled 2017-06-25: qty 1
  Filled 2017-06-25: qty 2

## 2017-06-25 MED ORDER — LACTATED RINGERS IV SOLN
Freq: Once | INTRAVENOUS | Status: AC
  Administered 2017-06-25: 10:00:00 via INTRAVENOUS

## 2017-06-25 MED ORDER — PHENYLEPHRINE HCL 10 MG/ML IJ SOLN
INTRAMUSCULAR | Status: DC | PRN
  Administered 2017-06-25 (×7): 100 ug via INTRAVENOUS

## 2017-06-25 MED ORDER — BUPIVACAINE HCL (PF) 0.25 % IJ SOLN
INTRAMUSCULAR | Status: AC
  Filled 2017-06-25: qty 30

## 2017-06-25 MED ORDER — ONDANSETRON HCL 4 MG/2ML IJ SOLN
INTRAMUSCULAR | Status: AC
  Filled 2017-06-25: qty 2

## 2017-06-25 MED ORDER — SUCCINYLCHOLINE CHLORIDE 20 MG/ML IJ SOLN
INTRAMUSCULAR | Status: DC | PRN
  Administered 2017-06-25: 100 mg via INTRAVENOUS

## 2017-06-25 MED ORDER — DEXAMETHASONE SODIUM PHOSPHATE 10 MG/ML IJ SOLN
INTRAMUSCULAR | Status: AC
  Filled 2017-06-25: qty 1

## 2017-06-25 MED ORDER — ALBUTEROL SULFATE (2.5 MG/3ML) 0.083% IN NEBU
2.5000 mg | INHALATION_SOLUTION | RESPIRATORY_TRACT | Status: DC
  Administered 2017-06-25 – 2017-06-26 (×3): 2.5 mg via RESPIRATORY_TRACT
  Filled 2017-06-25 (×3): qty 3

## 2017-06-25 MED ORDER — PROPOFOL 10 MG/ML IV BOLUS
INTRAVENOUS | Status: AC
  Filled 2017-06-25: qty 20

## 2017-06-25 MED ORDER — MUPIROCIN CALCIUM 2 % NA OINT
1.0000 "application " | TOPICAL_OINTMENT | Freq: Two times a day (BID) | NASAL | Status: DC
  Administered 2017-06-26: 1 via NASAL
  Filled 2017-06-25: qty 1

## 2017-06-25 MED ORDER — LACTATED RINGERS IV SOLN
INTRAVENOUS | Status: DC | PRN
  Administered 2017-06-25 (×2): via INTRAVENOUS

## 2017-06-25 MED ORDER — FENTANYL CITRATE (PF) 100 MCG/2ML IJ SOLN
25.0000 ug | INTRAMUSCULAR | Status: DC | PRN
  Administered 2017-06-25: 25 ug via INTRAVENOUS

## 2017-06-25 MED ORDER — ACETAMINOPHEN 10 MG/ML IV SOLN
INTRAVENOUS | Status: AC
  Filled 2017-06-25: qty 100

## 2017-06-25 MED ORDER — BISACODYL 5 MG PO TBEC
10.0000 mg | DELAYED_RELEASE_TABLET | Freq: Every day | ORAL | Status: DC
  Administered 2017-06-25 – 2017-06-26 (×2): 10 mg via ORAL
  Filled 2017-06-25 (×3): qty 2

## 2017-06-25 MED ORDER — ROCURONIUM BROMIDE 50 MG/5ML IV SOLN
INTRAVENOUS | Status: AC
  Filled 2017-06-25: qty 1

## 2017-06-25 MED ORDER — OXYCODONE HCL 5 MG/5ML PO SOLN
5.0000 mg | Freq: Once | ORAL | Status: DC | PRN
Start: 2017-06-25 — End: 2017-06-25

## 2017-06-25 MED ORDER — SODIUM CHLORIDE 0.9 % IV SOLN
INTRAVENOUS | Status: DC | PRN
  Administered 2017-06-25: 50 ug/min via INTRAVENOUS

## 2017-06-25 MED ORDER — METOPROLOL SUCCINATE ER 25 MG PO TB24
25.0000 mg | ORAL_TABLET | Freq: Every day | ORAL | Status: DC
  Administered 2017-06-27: 25 mg via ORAL
  Filled 2017-06-25 (×2): qty 1

## 2017-06-25 MED ORDER — MIDAZOLAM HCL 2 MG/2ML IJ SOLN
INTRAMUSCULAR | Status: AC
  Filled 2017-06-25: qty 2

## 2017-06-25 MED ORDER — SUGAMMADEX SODIUM 200 MG/2ML IV SOLN
INTRAVENOUS | Status: DC | PRN
  Administered 2017-06-25: 183.2 mg via INTRAVENOUS

## 2017-06-25 MED ORDER — PROPOFOL 10 MG/ML IV BOLUS
INTRAVENOUS | Status: DC | PRN
  Administered 2017-06-25: 50 mg via INTRAVENOUS
  Administered 2017-06-25: 150 mg via INTRAVENOUS

## 2017-06-25 MED ORDER — SUCCINYLCHOLINE CHLORIDE 20 MG/ML IJ SOLN
INTRAMUSCULAR | Status: AC
  Filled 2017-06-25: qty 1

## 2017-06-25 MED ORDER — TALC 5 G PL SUSR
5.0000 g | Freq: Once | INTRAPLEURAL | Status: DC
  Filled 2017-06-25: qty 5

## 2017-06-25 MED ORDER — FENTANYL CITRATE (PF) 100 MCG/2ML IJ SOLN
INTRAMUSCULAR | Status: AC
  Filled 2017-06-25: qty 2

## 2017-06-25 MED ORDER — ACETAMINOPHEN 10 MG/ML IV SOLN
INTRAVENOUS | Status: DC | PRN
  Administered 2017-06-25: 1000 mg via INTRAVENOUS

## 2017-06-25 MED ORDER — FLUTICASONE PROPIONATE 50 MCG/ACT NA SUSP
1.0000 | Freq: Every day | NASAL | Status: DC
  Administered 2017-06-25 – 2017-06-27 (×3): 1 via NASAL
  Filled 2017-06-25: qty 16

## 2017-06-25 MED ORDER — OXYCODONE HCL 5 MG PO TABS: 5.0000 mg | ORAL_TABLET | Freq: Once | ORAL | Status: DC | PRN

## 2017-06-25 MED ORDER — FENTANYL CITRATE (PF) 100 MCG/2ML IJ SOLN
INTRAMUSCULAR | Status: AC
  Administered 2017-06-25: 25 ug via INTRAVENOUS
  Filled 2017-06-25: qty 2

## 2017-06-25 MED ORDER — PANTOPRAZOLE SODIUM 40 MG PO TBEC
40.0000 mg | DELAYED_RELEASE_TABLET | Freq: Every day | ORAL | Status: DC
  Administered 2017-06-26 – 2017-06-27 (×2): 40 mg via ORAL
  Filled 2017-06-25 (×2): qty 1

## 2017-06-25 MED ORDER — FENTANYL CITRATE (PF) 100 MCG/2ML IJ SOLN
INTRAMUSCULAR | Status: DC | PRN
  Administered 2017-06-25: 100 ug via INTRAVENOUS

## 2017-06-25 MED ORDER — TALC 5 G PL SUSR
4.0000 g | Freq: Once | INTRAPLEURAL | Status: AC
  Administered 2017-06-25: 4 g via INTRAPLEURAL
  Filled 2017-06-25: qty 5

## 2017-06-25 MED ORDER — MORPHINE SULFATE (PF) 2 MG/ML IV SOLN: 2.0000 mg | INTRAVENOUS | Status: DC | PRN

## 2017-06-25 MED ORDER — ROCURONIUM BROMIDE 100 MG/10ML IV SOLN
INTRAVENOUS | Status: DC | PRN
  Administered 2017-06-25: 20 mg via INTRAVENOUS
  Administered 2017-06-25: 30 mg via INTRAVENOUS

## 2017-06-25 MED ORDER — POLYETHYLENE GLYCOL 3350 17 G PO PACK
17.0000 g | PACK | Freq: Every day | ORAL | Status: DC
  Administered 2017-06-25 – 2017-06-26 (×2): 17 g via ORAL
  Filled 2017-06-25 (×3): qty 1

## 2017-06-25 MED ORDER — LISINOPRIL 10 MG PO TABS
10.0000 mg | ORAL_TABLET | Freq: Every day | ORAL | Status: DC
  Administered 2017-06-27: 10 mg via ORAL
  Filled 2017-06-25 (×2): qty 1

## 2017-06-25 MED ORDER — DEXTROSE-NACL 5-0.45 % IV SOLN
INTRAVENOUS | Status: DC
  Administered 2017-06-25 – 2017-06-26 (×2): via INTRAVENOUS

## 2017-06-25 MED ORDER — PRAVASTATIN SODIUM 20 MG PO TABS
40.0000 mg | ORAL_TABLET | Freq: Every day | ORAL | Status: DC
  Administered 2017-06-26 – 2017-06-27 (×2): 40 mg via ORAL
  Filled 2017-06-25 (×2): qty 2

## 2017-06-25 MED ORDER — DEXAMETHASONE SODIUM PHOSPHATE 10 MG/ML IJ SOLN
INTRAMUSCULAR | Status: DC | PRN
  Administered 2017-06-25: 10 mg via INTRAVENOUS

## 2017-06-25 SURGICAL SUPPLY — 78 items
BLADE SURG SZ11 CARB STEEL (BLADE) ×3 IMPLANT
BNDG COHESIVE 4X5 TAN STRL (GAUZE/BANDAGES/DRESSINGS) ×3 IMPLANT
BRONCHOSCOPE PED SLIM DISP (MISCELLANEOUS) ×3 IMPLANT
CANISTER SUCT 1200ML W/VALVE (MISCELLANEOUS) ×3 IMPLANT
CATH TRAY 16F METER LATEX (MISCELLANEOUS) ×3 IMPLANT
CATH URET ROBINSON 16FR STRL (CATHETERS) IMPLANT
CHLORAPREP W/TINT 26ML (MISCELLANEOUS) ×6 IMPLANT
CNTNR SPEC 2.5X3XGRAD LEK (MISCELLANEOUS) ×2
CONN REDUCER 1/4X3/8 STR (CONNECTOR) ×3
CONNECTOR REDUCER 1/4X3/8 STR (CONNECTOR) ×2 IMPLANT
CONT SPEC 4OZ STER OR WHT (MISCELLANEOUS) ×1
CONTAINER SPEC 2.5X3XGRAD LEK (MISCELLANEOUS) ×2 IMPLANT
CUTTER ECHEON FLEX ENDO 45 340 (ENDOMECHANICALS) IMPLANT
DEFOGGER SCOPE WARMER CLEARIFY (MISCELLANEOUS) ×3 IMPLANT
DRAIN CHANNEL 28F RND 3/8 FF (WOUND CARE) ×3 IMPLANT
DRAIN CHEST DRY SUCT SGL (MISCELLANEOUS) ×6 IMPLANT
DRAPE C-SECTION (MISCELLANEOUS) ×3 IMPLANT
DRAPE INCISE 23X17 IOBAN STRL (DRAPES) ×1
DRAPE INCISE IOBAN 23X17 STRL (DRAPES) ×2 IMPLANT
DRAPE INCISE IOBAN 66X45 STRL (DRAPES) IMPLANT
DRAPE LAPAROTOMY 77X122 PED (DRAPES) IMPLANT
DRAPE MAG INST 16X20 L/F (DRAPES) ×3 IMPLANT
DRAPE POUCH INSTRU U-SHP 10X18 (DRAPES) ×3 IMPLANT
DRSG OPSITE POSTOP 3X4 (GAUZE/BANDAGES/DRESSINGS) IMPLANT
ELECT BLADE 6 FLAT ULTRCLN (ELECTRODE) ×3 IMPLANT
ELECT BLADE 6.5 EXT (BLADE) ×3 IMPLANT
ELECT CAUTERY BLADE TIP 2.5 (TIP) ×3
ELECT REM PT RETURN 9FT ADLT (ELECTROSURGICAL) ×3
ELECTRODE CAUTERY BLDE TIP 2.5 (TIP) ×2 IMPLANT
ELECTRODE REM PT RTRN 9FT ADLT (ELECTROSURGICAL) ×2 IMPLANT
GAUZE SPONGE 4X4 12PLY STRL (GAUZE/BANDAGES/DRESSINGS) ×6 IMPLANT
GLOVE SURG SYN 7.5  E (GLOVE) ×2
GLOVE SURG SYN 7.5 E (GLOVE) ×4 IMPLANT
GOWN STRL REUS W/ TWL LRG LVL3 (GOWN DISPOSABLE) ×4 IMPLANT
GOWN STRL REUS W/TWL LRG LVL3 (GOWN DISPOSABLE) ×2
KIT PLEURX DRAIN CATH 15.5FR (DRAIN) ×6 IMPLANT
KIT RM TURNOVER STRD PROC AR (KITS) ×3 IMPLANT
LABEL OR SOLS (LABEL) ×3 IMPLANT
LOOP RED MAXI  1X406MM (MISCELLANEOUS)
LOOP VESSEL MAXI 1X406 RED (MISCELLANEOUS) IMPLANT
MARKER SKIN DUAL TIP RULER LAB (MISCELLANEOUS) ×3 IMPLANT
NEEDLE FILTER BLUNT 18X 1/2SAF (NEEDLE)
NEEDLE FILTER BLUNT 18X1 1/2 (NEEDLE) IMPLANT
NEEDLE SPNL 20GX3.5 QUINCKE YW (NEEDLE) IMPLANT
NS IRRIG 500ML POUR BTL (IV SOLUTION) IMPLANT
PACK BASIN MAJOR ARMC (MISCELLANEOUS) ×3 IMPLANT
PACK BASIN MINOR ARMC (MISCELLANEOUS) ×3 IMPLANT
SCISSORS METZENBAUM CVD 33 (INSTRUMENTS) ×3 IMPLANT
SPONGE KITTNER 5P (MISCELLANEOUS) ×6 IMPLANT
STAPLER SKIN PROX 35W (STAPLE) ×3 IMPLANT
STAPLER VASCULAR ECHELON 35 (CUTTER) IMPLANT
SUCTION FRAZIER HANDLE 10FR (MISCELLANEOUS) ×1
SUCTION TUBE FRAZIER 10FR DISP (MISCELLANEOUS) ×2 IMPLANT
SUT ETHILON 3-0 FS-10 30 BLK (SUTURE)
SUT ETHILON 4-0 (SUTURE)
SUT ETHILON 4-0 FS2 18XMFL BLK (SUTURE)
SUT MNCRL AB 3-0 PS2 27 (SUTURE) IMPLANT
SUT SILK 1 SH (SUTURE) ×18 IMPLANT
SUT VIC AB 0 CT1 36 (SUTURE) ×3 IMPLANT
SUT VIC AB 0 SH 27 (SUTURE) ×3 IMPLANT
SUT VIC AB 2-0 CT1 27 (SUTURE)
SUT VIC AB 2-0 CT1 TAPERPNT 27 (SUTURE) IMPLANT
SUT VIC AB 2-0 CT2 27 (SUTURE) ×6 IMPLANT
SUT VIC AB 2-0 SH 27 (SUTURE)
SUT VIC AB 2-0 SH 27XBRD (SUTURE) IMPLANT
SUT VIC AB 3-0 SH 27 (SUTURE)
SUT VIC AB 3-0 SH 27X BRD (SUTURE) IMPLANT
SUT VICRYL 2 TP 1 (SUTURE) IMPLANT
SUTURE EHLN 3-0 FS-10 30 BLK (SUTURE) IMPLANT
SUTURE ETHLN 4-0 FS2 18XMF BLK (SUTURE) IMPLANT
SYR 30ML LL (SYRINGE) ×3 IMPLANT
SYR BULB IRRIG 60ML STRL (SYRINGE) ×6 IMPLANT
TAPE ADH 3 LX (MISCELLANEOUS) ×3 IMPLANT
TAPE TRANSPORE STRL 2 31045 (GAUZE/BANDAGES/DRESSINGS) ×3 IMPLANT
TROCAR FLEXIPATH 20X80 (ENDOMECHANICALS) ×3 IMPLANT
TROCAR FLEXIPATH THORACIC 15MM (ENDOMECHANICALS) IMPLANT
WATER STERILE IRR 1000ML POUR (IV SOLUTION) ×3 IMPLANT
YANKAUER SUCT BULB TIP FLEX NO (MISCELLANEOUS) ×3 IMPLANT

## 2017-06-25 NOTE — Anesthesia Postprocedure Evaluation (Signed)
Anesthesia Post Note  Patient: Ian Shaffer  Procedure(s) Performed: Procedure(s) (LRB): VIDEO ASSISTED THORACOSCOPY WITH TALC PLEURODESIS, CHEST TUBE INSERTION (Right) PLEURX CATHETER INSERTION (N/A)  Patient location during evaluation: PACU Anesthesia Type: General Level of consciousness: awake and alert Pain management: pain level controlled Vital Signs Assessment: post-procedure vital signs reviewed and stable Respiratory status: spontaneous breathing, nonlabored ventilation, respiratory function stable and patient connected to nasal cannula oxygen Cardiovascular status: blood pressure returned to baseline and stable Postop Assessment: no signs of nausea or vomiting Anesthetic complications: no     Last Vitals:  Vitals:   06/25/17 1325 06/25/17 1346  BP: 109/70 112/68  Pulse: 65 70  Resp: 20 (!) 22  Temp: (!) 36.1 C 36.8 C    Last Pain:  Vitals:   06/25/17 1358  TempSrc:   PainSc: 4                  Joseph K Piscitello

## 2017-06-25 NOTE — Anesthesia Post-op Follow-up Note (Cosign Needed)
Anesthesia QCDR form completed.        

## 2017-06-25 NOTE — Transfer of Care (Signed)
Immediate Anesthesia Transfer of Care Note  Patient: Ian Shaffer  Procedure(s) Performed: Procedure(s): VIDEO ASSISTED THORACOSCOPY WITH TALC PLEURODESIS, CHEST TUBE INSERTION (Right) PLEURX CATHETER INSERTION (N/A)  Patient Location: PACU  Anesthesia Type:General  Level of Consciousness: awake, alert  and oriented  Airway & Oxygen Therapy: Patient Spontanous Breathing  Post-op Assessment: Post -op Vital signs reviewed and stable  Post vital signs: stable  Last Vitals:  Vitals:   06/25/17 1211 06/25/17 1212  BP:  (!) 124/99  Pulse:  74  Resp:  14  Temp: (!) 36.1 C     Last Pain:  Vitals:   06/25/17 0918  TempSrc: Oral         Complications: No apparent anesthesia complications

## 2017-06-25 NOTE — Anesthesia Preprocedure Evaluation (Signed)
Anesthesia Evaluation  Patient identified by MRN, date of birth, ID band Patient awake    Reviewed: Allergy & Precautions, H&P , NPO status , Patient's Chart, lab work & pertinent test results  History of Anesthesia Complications Negative for: history of anesthetic complications  Airway Mallampati: III  TM Distance: >3 FB Neck ROM: limited    Dental  (+) Poor Dentition, Chipped, Caps, Missing   Pulmonary neg shortness of breath, former smoker,           Cardiovascular Exercise Tolerance: Good hypertension, (-) angina+ Peripheral Vascular Disease  (-) Past MI      Neuro/Psych  Neuromuscular disease CVA, Residual Symptoms negative psych ROS   GI/Hepatic Neg liver ROS, GERD  Medicated and Controlled,  Endo/Other  diabetes, Type 2  Renal/GU Renal disease     Musculoskeletal   Abdominal   Peds  Hematology negative hematology ROS (+)   Anesthesia Other Findings Past Medical History: 05/31/2014: Benign essential hypertension 05/31/2014: Cerebral infarction ([place]) 05/31/2014: Cerebrovascular disease 02/23/2016: CKD (chronic kidney disease) stage 3, GFR 30-59 ml/min No date: CVA (cerebral infarction) No date: Diabetes mellitus without complication (HCC) No date: DVT (deep venous thrombosis) ([place]) 05/31/2014: Esophageal reflux No date: H/O: CVA (cerebrovascular accident) No date: Hyperlipidemia No date: Hypertension March 01, 2014: ICAO (internal carotid artery occlusion)     Comment:  Left 05/09/2017: Localized, primary osteoarthritis of shoulder region 04/23/2017: Pleural effusion April, 4,2015: Stroke ([place]) 02/23/2016: Type 2 diabetes mellitus with peripheral neuropathy (HCC) No date: Weakness  Past Surgical History: 2000: NASAL SINUS SURGERY No date: TONSILLECTOMY  BMI    Body Mass Index:  28.98 kg/m      Reproductive/Obstetrics negative OB ROS                             Anesthesia  Physical Anesthesia Plan  ASA: III  Anesthesia Plan: General ETT   Post-op Pain Management:    Induction: Intravenous  PONV Risk Score and Plan: 2 and Ondansetron, Dexamethasone and Treatment may vary due to age or medical condition  Airway Management Planned: Oral ETT and Double Lumen EBT  Additional Equipment:   Intra-op Plan:   Post-operative Plan: Extubation in OR and Possible Post-op intubation/ventilation  Informed Consent: I have reviewed the patients History and Physical, chart, labs and discussed the procedure including the risks, benefits and alternatives for the proposed anesthesia with the patient or authorized representative who has indicated his/her understanding and acceptance.   Dental Advisory Given  Plan Discussed with: Anesthesiologist, CRNA and Surgeon  Anesthesia Plan Comments: (Patient consented for risks of anesthesia including but not limited to:  - adverse reactions to medications - damage to teeth, lips or other oral mucosa - sore throat or hoarseness - Damage to heart, brain, lungs or loss of life  Patient voiced understanding.)        Anesthesia Quick Evaluation

## 2017-06-25 NOTE — Anesthesia Procedure Notes (Signed)
Procedure Name: Intubation Date/Time: 06/25/2017 10:19 AM Performed by: Aline Brochure Pre-anesthesia Checklist: Patient identified, Emergency Drugs available, Suction available and Patient being monitored Patient Re-evaluated:Patient Re-evaluated prior to induction Oxygen Delivery Method: Circle system utilized Preoxygenation: Pre-oxygenation with 100% oxygen Induction Type: IV induction Ventilation: Mask ventilation without difficulty Laryngoscope Size: McGraph and 4 Grade View: Grade I Endobronchial tube: Left, Double lumen EBT and EBT position confirmed by fiberoptic bronchoscope and 39 Fr Number of attempts: 1 Airway Equipment and Method: Bougie stylet Placement Confirmation: ETT inserted through vocal cords under direct vision,  positive ETCO2 and breath sounds checked- equal and bilateral Secured at: 30 cm Tube secured with: Tape Difficulty Due To: Difficulty was anticipated Future Recommendations: Recommend- induction with short-acting agent, and alternative techniques readily available

## 2017-06-25 NOTE — Interval H&P Note (Signed)
History and Physical Interval Note:  06/25/2017 9:42 AM  Ian Shaffer  has presented today for surgery, with the diagnosis of pleural effusion  The various methods of treatment have been discussed with the patient and family. After consideration of risks, benefits and other options for treatment, the patient has consented to  Procedure(s): VIDEO ASSISTED THORACOSCOPY WITH PLEURODESIS (Right) [place] (N/A) as a surgical intervention .  The patient's history has been reviewed, patient examined, no change in status, stable for surgery.  I have reviewed the patient's chart and labs.  Questions were answered to the patient's satisfaction.     Nestor Lewandowsky

## 2017-06-25 NOTE — Op Note (Signed)
  06/25/2017  12:56 PM  PATIENT:  Ian Shaffer  71 y.o. 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 Preoperative bronchoscopy to assess endobronchial anatomy 2. Right thoracoscopy with biopsy of mediastinal mass 3. Talc pleurodesis 4. Insertion of tunneled Pleurx catheter  SURGEON:  Surgeon(s) and Role:    * Nestor Lewandowsky, MD - Primary  ASSISTANTS: None  ANESTHESIA: Gen.  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 He had a CT scan done approximately one week ago showing a mass involving the posterior aspect of the right chest extending through the diaphragm and into the retroperitoneal space. He has also undergone 4 separate thoracenteses in the last 3 months and it was felt that he would benefit from the above-named procedure for both definitive diagnosis and for therapy. The indications and risks of the procedure were explained the patient gave his informed consent.  The patient is brought to the operating suite and placed in the supine position. General endotracheal anesthesia was given through a double-lumen endotracheal tube. Preoperative bronchoscopy was carried out. There was no evidence of tumor within the right or left lungs. The patient was then turned for right thoracoscopy. All pressure points were carefully padded. Patient was prepped and draped in usual sterile fashion.  We began by making a 2 cm incision at approximately the seventh intercostal space. Once the incision was deepened down through the muscles of the chest wall the pleural space was entered. There is only a small amount  of pleural fluid present which I estimated about 3-400 cc. This was suctioned. There were some flimsy loose adhesions from the lung to the chest wall and diaphragm. When this was swept down we could see that there is a large mass involving the posterior right chest and extending through the diaphragm. We then took multiple biopsies of this mediastinal mass which was sent for frozen section and returned a diagnosis of carcinoma.  After the chest was suctioned dry and hemostasis was complete we then placed a Pleurx catheter through a separate stab wound anteriorly. This was all done under direct visualization with the scope. The Pleurx catheter was inserted along the paravertebral space. We then insufflated 4 g of sterile talc coating the entire pleural space. The chest was drained with a 28 Blake laced through the thoracoscopy port and brought out through a separate stab wound. The lung was reinflated and all the wounds were closed. Muscles were closed with Vicryl and the subtest tissues with Vicryl and the skin with nylon.  The patient was then extubated and taken to the recovery room in stable condition.   Nestor Lewandowsky, MD

## 2017-06-25 NOTE — H&P (View-Only) (Signed)
  Patient ID: Ian Shaffer, male   DOB: June 03, 1946, 71 y.o.   MRN: [id number]  HISTORY: This patient came in today on an urgent basis. He states that he awoke this morning around 5:30 and was short of breath and could not give back to sleep. He feels some heaviness in the right side of his chest similar to prior whenever he had his large pleural effusion. I asked him to come in today for evaluation. He does have a cough whenever he is up moving around. He also states he gets short of breath when he is lying flat or with exertion.   Vitals:   06/23/17 1235  BP: 130/85  Pulse: 89  Temp: 98.1 F (36.7 C)     EXAM:    Resp: Lungs Show diminished breath sounds about halfway up the right chest..  No respiratory distress, normal effort. Heart:  Regular without murmurs Abd:  Abdomen is soft, non distended and non tender. No masses are palpable.  There is no rebound and no guarding.  Neurological: Alert and oriented to person, place, and time. Coordination normal.  Skin: Skin is warm and dry. No rash noted. No diaphoretic. No erythema. No pallor.  Psychiatric: Normal mood and affect. Normal behavior. Judgment and thought content normal.    ASSESSMENT: I have independently reviewed the patient's chest x-ray from today. There is a large right-sided pleural effusion.   PLAN:   I discussed this with the patient. He does not feel that he can wait until his surgical procedure later this week and would like something done. We'll go ahead and set him up for an ultrasound-guided thoracentesis today. He will come back on Wednesday for his surgical procedure.    Nestor Lewandowsky, MD

## 2017-06-26 LAB — GLUCOSE, CAPILLARY
GLUCOSE-CAPILLARY: 223 mg/dL — AB (ref 65–99)
GLUCOSE-CAPILLARY: 198 mg/dL — AB (ref 65–99)
Glucose-Capillary: 184 mg/dL — ABNORMAL HIGH (ref 65–99)

## 2017-06-26 MED ORDER — ALBUTEROL SULFATE (2.5 MG/3ML) 0.083% IN NEBU
2.5000 mg | INHALATION_SOLUTION | Freq: Three times a day (TID) | RESPIRATORY_TRACT | Status: DC
  Administered 2017-06-26 – 2017-06-27 (×3): 2.5 mg via RESPIRATORY_TRACT
  Filled 2017-06-26 (×3): qty 3

## 2017-06-26 MED ORDER — ALBUTEROL SULFATE (2.5 MG/3ML) 0.083% IN NEBU: 2.5000 mg | INHALATION_SOLUTION | RESPIRATORY_TRACT | Status: DC | PRN

## 2017-06-26 MED ORDER — MENTHOL 3 MG MT LOZG
1.0000 | LOZENGE | OROMUCOSAL | Status: DC | PRN
  Administered 2017-06-26: 3 mg via ORAL
  Filled 2017-06-26: qty 9

## 2017-06-26 MED ORDER — GLUCERNA SHAKE PO LIQD
237.0000 mL | Freq: Two times a day (BID) | ORAL | Status: DC
  Administered 2017-06-27: 237 mL via ORAL

## 2017-06-26 NOTE — Progress Notes (Signed)
RN performed pleurX drain education. Pt and spouse verbalize understanding and demonstrates proper teachback.

## 2017-06-26 NOTE — Progress Notes (Signed)
Initial Nutrition Assessment  DOCUMENTATION CODES:   Not applicable  INTERVENTION:   Glucerna Shake po BID, each supplement provides 220 kcal and 10 grams of protein  NUTRITION DIAGNOSIS:   Increased nutrient needs related to  (lung mass with pleural effusion ) as evidenced by increased estimated needs from protein.  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 Pt s/p pleurx cath insertion 8/1. Pt reports decreased appetite over the past few weeks related to the increased pressure from the pleural effusion. Pt reports that his appetite is improved today and he is eating 100% of meals. Pt drinks Glucerna at home; usually one per day. RD will order Glucerna per pt request. Per chart, pt remains weight stable.    Medications reviewed and include: dulcolax, protonix, miralax, tramadol  Labs reviewed: BUN 21(H), creat 1.32(H)  Nutrition-Focused physical exam completed. Findings are no fat depletion, no muscle depletion, and no edema.   Diet Order:  Diet Carb Modified Fluid consistency: Thin; Room service appropriate? Yes  Skin:  Wound (see comment) (incision chest)  Last BM:  7/31  Height:   Ht Readings from Last 1 Encounters:  06/25/17 5' 10"  (1.778 m)    Weight:   Wt Readings from Last 1 Encounters:  06/25/17 202 lb (91.6 kg)    Ideal Body Weight:  75.4 kg  BMI:  Body mass index is 28.98 kg/m.  Estimated Nutritional Needs:   Kcal:  2000-2300kcal/day   Protein:  92-110g/day   Fluid:  >2L/day   EDUCATION NEEDS:   Education needs addressed  Koleen Distance MS, RD, LDN Pager #[phone] After Hours Pager: [phone]

## 2017-06-27 ENCOUNTER — Other Ambulatory Visit: Payer: Self-pay

## 2017-06-27 DIAGNOSIS — J90 Pleural effusion, not elsewhere classified: Secondary | ICD-10-CM

## 2017-06-27 LAB — URINALYSIS, COMPLETE (UACMP) WITH MICROSCOPIC
Bacteria, UA: NONE SEEN
Bilirubin Urine: NEGATIVE
GLUCOSE, UA: NEGATIVE mg/dL
Ketones, ur: NEGATIVE mg/dL
NITRITE: NEGATIVE
Protein, ur: NEGATIVE mg/dL
SPECIFIC GRAVITY, URINE: 1.012 (ref 1.005–1.030)
pH: 5 (ref 5.0–8.0)

## 2017-06-27 LAB — GLUCOSE, CAPILLARY
GLUCOSE-CAPILLARY: 157 mg/dL — AB (ref 65–99)
GLUCOSE-CAPILLARY: 167 mg/dL — AB (ref 65–99)

## 2017-06-27 MED ORDER — BISACODYL 10 MG RE SUPP
10.0000 mg | Freq: Once | RECTAL | Status: AC
  Administered 2017-06-27: 10 mg via RECTAL
  Filled 2017-06-27: qty 1

## 2017-06-27 NOTE — Progress Notes (Signed)
Patient discharge teaching given, including activity, diet, follow-up appoints, and medications. Patient verbalized understanding of all discharge instructions. IV access was d/c'd. Vitals are stable. RN sent pt home with two pleurvax equipment, pt verbally states that he understand how the drainage system works. Skin is intact except as charted in most recent assessments. Pt to be escorted out by volunteer, to be driven home by family.  Ryheem Jay [employer]

## 2017-06-27 NOTE — Care Management (Signed)
Patient admitted with Right thoracoscopy with biopsy of mediastinal mass with talc pleurodesis and insertion of PleurX catheter. Patient lives at home with wife.  Patient to discharge with PleurX Cath. Home health services have been arranged prior to admission through [facility] for PleurX management.  RNCM confirmed with Sarah at [facility] that "protocol" was in place, and no additional orders were needed.  Bedside RN to discharge patient with 2 additional Pleurx kits at discharge.  RNCM signing off.

## 2017-06-27 NOTE — Discharge Summary (Signed)
Physician Discharge Summary  Patient ID: Ian Shaffer MRN: [id number] DOB/AGE: 1946/03/20 71 y.o.  Admit date: 06/25/2017 Discharge date: 06/27/2017   Discharge Diagnoses:  Active Problems:   Lung mass   Procedures:Right thoracoscopy with biopsy of mediastinal mass with talc pleurodesis and insertion of PleurX catheter   Hospital Course: Admitted after the above procedure.  Chest tube drainage was serous and decreasing at the time of discharge.  No path back at the time of disharge.  Will drain catheter every 48 hours.  Will see in one week.    Disposition: 01-Home or Self Care  Discharge Instructions    Diet - low sodium heart healthy    Complete by:  As directed    Discharge instructions    Complete by:  As directed    Drain PleurX catheter every 48 hours starting on Sunday.   Increase activity slowly    Complete by:  As directed      Allergies as of 06/27/2017      Reactions   Amoxicillin Nausea Only   Has patient had a PCN reaction causing immediate rash, facial/tongue/throat swelling, SOB or lightheadedness with hypotension: No Has patient had a PCN reaction causing severe rash involving mucus membranes or skin necrosis: No Has patient had a PCN reaction that required hospitalization: No Has patient had a PCN reaction occurring within the last 10 years: Yes If all of the above answers are "NO", then may proceed with Cephalosporin use.   Sulfa Antibiotics Swelling   Facial swelling No tongue or lips swelling, no difficulty breathing.      Medication List    STOP taking these medications   UNABLE TO FIND     TAKE these medications   amLODipine 5 MG tablet Commonly known as:  NORVASC amlodipine 5 mg tablet in am.   aspirin 81 MG tablet Take 81 mg by mouth daily. In am.   CENTRUM SILVER PO Take 1 tablet by mouth daily.   clopidogrel 75 MG tablet Commonly known as:  PLAVIX clopidogrel 75 mg tablet in am.   fluticasone 50 MCG/ACT nasal spray Commonly  known as:  FLONASE Place 1 spray into both nostrils daily.   lisinopril 10 MG tablet Commonly known as:  PRINIVIL,ZESTRIL TAKE 1 TABLET BY MOUTH DAILY in am.   metoprolol succinate 25 MG 24 hr tablet Commonly known as:  TOPROL-XL TAKE 1 TABLET BY MOUTH DAILY   mupirocin nasal ointment 2 % Commonly known as:  BACTROBAN NASAL Place 1 application into the nose 2 (two) times daily. Use one-half of tube in each nostril twice daily for five (5) days. After application, press sides of nose together and gently massage.   pantoprazole 40 MG tablet Commonly known as:  PROTONIX TAKE 1 TABLET BY MOUTH DAILY in am.   polyethylene glycol packet Commonly known as:  MIRALAX / GLYCOLAX Take 17 g by mouth daily.   pravastatin 40 MG tablet Commonly known as:  PRAVACHOL TAKE 1 TABLET BY MOUTH DAILY in am.   VICTOZA [place] Inject 1.2 mg into the skin daily. In am.        Nestor Lewandowsky, MD

## 2017-06-27 NOTE — Progress Notes (Signed)
Low grade temp last evening.  Some urinary frequency after Foley removed.    Wounds are all clean and dry.  No air leak  Removed the Blake drain  Redressed all the wounds. Family changed dressing around PleurX  Will discharge today.   Will see in one week Discharge orders done. Instructions for q 48 hour drainage given  [employer].

## 2017-06-27 NOTE — Progress Notes (Signed)
Inpatient Diabetes Program Recommendations  AACE/ADA: New Consensus Statement on Inpatient Glycemic Control (2015)  Target Ranges:  Prepandial:   less than 140 mg/dL      Peak postprandial:   less than 180 mg/dL (1-2 hours)      Critically ill patients:  140 - 180 mg/dL   Lab Results  Component Value Date   GLUCAP 157 (H) 06/27/2017    Review of Glycemic ControlResults for CARLE, FENECH (MRN [id number]) as of 06/27/2017 09:19  Ref. Range 06/26/2017 13:44 06/26/2017 17:06 06/26/2017 22:03 06/27/2017 07:51  Glucose-Capillary Latest Ref Range: 65 - 99 mg/dL 223 (H) 198 (H) 184 (H) 157 (H)   Diabetes history: Type 2 diabetes Outpatient Diabetes medications: Victoza 1.2 mg daily Current orders for Inpatient glycemic control:  None Inpatient Diabetes Program Recommendations:   Please consider adding Novolog moderate correction tid with meals and HS while in the hospital.   Thanks, Adah Perl, RN, BC-ADM Inpatient Diabetes Coordinator Pager [phone] (8a-5p)

## 2017-06-27 NOTE — Care Management Important Message (Signed)
Important Message  Patient Details  Name: Ian Shaffer MRN: [id number] Date of Birth: 14-Apr-1946   Medicare Important Message Given:  N/A - LOS <3 / Initial given by admissions    Beverly Sessions, RN 06/27/2017, 11:43 AM

## 2017-06-28 LAB — URINE CULTURE: CULTURE: NO GROWTH

## 2017-06-28 NOTE — Care Management Note (Signed)
Case Management Note  Patient Details  Name: Ian Shaffer MRN: [id number] Date of Birth: 01-22-1946  Subjective/Objective:       Received Shaffer phone call from Ian Shaffer, Ian Ian Shaffer's wife, about her concern that they only have one more PleurX  bottle. This Probation officer read her the discharge instructions on Ian Shaffer's chart  " Drain PleurX catheter every 48 hours starting on Sunday".  This Probation officer also called Ian Shaffer at [facility] to update the [facility] nurse about Ian Shaffer discharge instructions. Ian Shaffer was provided with Ian Shaffer office number to call on Monday if she has questions or concerns about Ian Shaffer. Ian Shaffer office number: [phone].            Action/Plan:   Expected Discharge Date:  06/27/17               Expected Discharge Plan:     In-House Referral:     Discharge planning Services     Post Acute Care Choice:    Choice offered to:     DME Arranged:    DME Agency:     HH Arranged:    HH Agency:     Status of Service:     If discussed at [employer], dates discussed:    Additional Comments:  Ian Kitchings A, RN 06/28/2017, 3:07 PM

## 2017-06-30 LAB — FUNGUS CULTURE WITH STAIN

## 2017-06-30 LAB — FUNGUS CULTURE RESULT

## 2017-06-30 LAB — FUNGAL ORGANISM REFLEX

## 2017-06-30 NOTE — Telephone Encounter (Signed)
Sherry from [facility] called stating that she went to the patient's house to drain his Pleurx catheter and she was only drained a teaspoon of serous sanguineus. She also wanted to let us know that the patient received a letter from [facility] letting him know that he was not approved to receive his products. She wanted to know how we could help the patient get his supplies since he was just sent home with one pleurx catheter kit. I told Judeen Hammans that I would have to speak with Amber, RN to look into this situation and then call her back. Sherry understood.

## 2017-07-01 NOTE — Telephone Encounter (Signed)
Spoke with Summer from [facility]) after speaking with [place]. I was informed by Denzil Hughes that due to patient being a Medicare patient, her would need to obtain his bottles for drainage and dressing changes from [facility]. Then once he is discharged by home health, he will then obtain bottles and dressing kits from [employer]. I explained all information to Summer whom will review with her nursing staff. She has information on how to obtain Pleurx supplies.

## 2017-07-02 ENCOUNTER — Telehealth: Payer: Self-pay

## 2017-07-02 ENCOUNTER — Ambulatory Visit: Payer: Medicare Other | Admitting: Family

## 2017-07-02 ENCOUNTER — Encounter (HOSPITAL_COMMUNITY): Payer: Medicare Other

## 2017-07-02 NOTE — Telephone Encounter (Signed)
Patient's wife called in at this time and states that patient has a rash down his entire back from his shoulders all of the way to his calves since leaving the hospital. I explained that it sounds like a contact dermatitis from the sheets during the hospitalization. He was informed to wash his entire backside where he is covered with the rash with soap and water, he is to take Benadryl 50mg  q6h x 3 days along with Ranitidine 150mg  Daily x 3 days to help with rash. If he is having severe itching, I also would like for him to place Hydrocortisone on his back as long as it stays 1 inch or more from all incisions. She verbalizes understanding. Reviewed post op appointment with patient's wife. She states other than his rash, he seems to be doing well.

## 2017-07-04 ENCOUNTER — Ambulatory Visit
Admission: RE | Admit: 2017-07-04 | Discharge: 2017-07-04 | Disposition: A | Discharge status: Home / Self Care | Payer: Medicare Other | Source: Ambulatory Visit | Attending: Cardiothoracic Surgery | Admitting: Cardiothoracic Surgery

## 2017-07-04 ENCOUNTER — Telehealth: Payer: Self-pay

## 2017-07-04 ENCOUNTER — Ambulatory Visit (INDEPENDENT_AMBULATORY_CARE_PROVIDER_SITE_OTHER): Payer: Medicare Other | Admitting: Cardiothoracic Surgery

## 2017-07-04 ENCOUNTER — Encounter: Payer: Self-pay | Admitting: Cardiothoracic Surgery

## 2017-07-04 VITALS — BP 149/79 | HR 77 | Temp 98.2°F | Resp 18 | Ht 70.0 in | Wt 197.2 lb

## 2017-07-04 DIAGNOSIS — J9811 Atelectasis: Secondary | ICD-10-CM | POA: Diagnosis not present

## 2017-07-04 DIAGNOSIS — J910 Malignant pleural effusion: Secondary | ICD-10-CM

## 2017-07-04 DIAGNOSIS — J90 Pleural effusion, not elsewhere classified: Secondary | ICD-10-CM | POA: Insufficient documentation

## 2017-07-04 NOTE — Progress Notes (Signed)
  Patient ID: Ian Shaffer, male   DOB: September 05, 1946, 71 y.o.   MRN: [id number]  HISTORY: He returns today in follow-up. He did have his Pleurx catheter drained once since hospital discharge. This resulted in minimal if any drainage from the tube. He states that he feels significantly better since his surgical procedure. I have discussed his care with Dr. 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   Vitals:   07/04/17 1039  BP: (!) 149/79  Pulse: 77  Resp: 18  Temp: 98.2 F (36.8 C)  SpO2: 98%     EXAM:   Skin: Skin is warm and dry. No rash noted. No diaphoretic. No erythema. No pallor.  Psychiatric: Normal mood and affect. Normal behavior. Judgment and thought content normal.  His thoracoscopy incision is well approximated. There is no erythema or drainage. The Pleurx catheter site is also clean and dry. There is no erythema or drainage  ASSESSMENT: I have independently reviewed his chest x-ray. 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 I would like him to see Dr. Rogue Bussing in oncology. I would also like to see him back in one week to remove his remaining sutures. He understands and will be in touch them regarding his appointment.    Nestor Lewandowsky, MD

## 2017-07-04 NOTE — Patient Instructions (Addendum)
Continue to Drain your Pleurx every other day to keep the lung expanded and allow the surgery to seal the lung to the chest wall to avoid this from occurring.  We will have you see Dr. Rogue Bussing (Oncologist) to determine your next step in a plan of care.  Please follow-up as scheduled below.  We will make a call to your Home Health Nurse today to review today's visit and instructions.

## 2017-07-04 NOTE — Telephone Encounter (Signed)
Appointment has been made with Dr. Rogue Bussing for 07/08/17 at 1400. Patient will need to arrive in [facility] at 1345.  Call made to patient at this time. All appointment information was given. Patient verbalizes understanding.

## 2017-07-07 ENCOUNTER — Telehealth: Payer: Self-pay

## 2017-07-07 NOTE — Telephone Encounter (Signed)
Spoke with Summer Therapist, occupational) at this time. She does confirm that supplies are in the [facility] and she will make sure that patient gets drained today.

## 2017-07-07 NOTE — Telephone Encounter (Signed)
Spoke with Barbera Setters, patient's nurse. She states that she will need to pick up patient's supplies at [facility] and then she will go out to drain the Pleurx. She was given orders to drain every other day until seen back in office by Dr. Genevive Bi on 07/25/17.

## 2017-07-07 NOTE — Telephone Encounter (Signed)
Patient called in and states that his home health nurse, Barbera Setters did not have Pleurx Kit to drain his catheter. Patient should have had Pleurx drained yesterday due to every other day orders.  Call made to supervisor of [facility] at this time. Awaiting response.

## 2017-07-08 ENCOUNTER — Inpatient Hospital Stay: Payer: Medicare Other | Attending: Internal Medicine | Admitting: Internal Medicine

## 2017-07-08 ENCOUNTER — Other Ambulatory Visit: Payer: Self-pay | Admitting: *Deleted

## 2017-07-08 ENCOUNTER — Other Ambulatory Visit: Payer: Self-pay | Admitting: Internal Medicine

## 2017-07-08 ENCOUNTER — Inpatient Hospital Stay: Payer: Medicare Other

## 2017-07-08 ENCOUNTER — Inpatient Hospital Stay: Payer: Medicare Other | Admitting: Internal Medicine

## 2017-07-08 DIAGNOSIS — J9811 Atelectasis: Secondary | ICD-10-CM | POA: Insufficient documentation

## 2017-07-08 DIAGNOSIS — Z79899 Other long term (current) drug therapy: Secondary | ICD-10-CM | POA: Insufficient documentation

## 2017-07-08 DIAGNOSIS — I679 Cerebrovascular disease, unspecified: Secondary | ICD-10-CM | POA: Diagnosis not present

## 2017-07-08 DIAGNOSIS — Z8673 Personal history of transient ischemic attack (TIA), and cerebral infarction without residual deficits: Secondary | ICD-10-CM | POA: Insufficient documentation

## 2017-07-08 DIAGNOSIS — N183 Chronic kidney disease, stage 3 (moderate): Secondary | ICD-10-CM | POA: Diagnosis not present

## 2017-07-08 DIAGNOSIS — E1122 Type 2 diabetes mellitus with diabetic chronic kidney disease: Secondary | ICD-10-CM | POA: Insufficient documentation

## 2017-07-08 DIAGNOSIS — K219 Gastro-esophageal reflux disease without esophagitis: Secondary | ICD-10-CM | POA: Diagnosis not present

## 2017-07-08 DIAGNOSIS — J90 Pleural effusion, not elsewhere classified: Secondary | ICD-10-CM | POA: Diagnosis not present

## 2017-07-08 DIAGNOSIS — I129 Hypertensive chronic kidney disease with stage 1 through stage 4 chronic kidney disease, or unspecified chronic kidney disease: Secondary | ICD-10-CM | POA: Diagnosis not present

## 2017-07-08 DIAGNOSIS — J9859 Other diseases of mediastinum, not elsewhere classified: Secondary | ICD-10-CM

## 2017-07-08 DIAGNOSIS — Z87891 Personal history of nicotine dependence: Secondary | ICD-10-CM | POA: Insufficient documentation

## 2017-07-08 DIAGNOSIS — Z7982 Long term (current) use of aspirin: Secondary | ICD-10-CM | POA: Insufficient documentation

## 2017-07-08 DIAGNOSIS — C801 Malignant (primary) neoplasm, unspecified: Secondary | ICD-10-CM

## 2017-07-08 DIAGNOSIS — E785 Hyperlipidemia, unspecified: Secondary | ICD-10-CM | POA: Insufficient documentation

## 2017-07-08 LAB — CBC WITH DIFFERENTIAL/PLATELET
BASOS ABS: 0.1 10*3/uL (ref 0–0.1)
Basophils Relative: 1 %
Eosinophils Absolute: 0.2 10*3/uL (ref 0–0.7)
Eosinophils Relative: 2 %
HEMATOCRIT: 38.5 % — AB (ref 40.0–52.0)
Hemoglobin: 12.9 g/dL — ABNORMAL LOW (ref 13.0–18.0)
LYMPHS PCT: 16 %
Lymphs Abs: 1.3 10*3/uL (ref 1.0–3.6)
MCH: 29.3 pg (ref 26.0–34.0)
MCHC: 33.6 g/dL (ref 32.0–36.0)
MCV: 87.2 fL (ref 80.0–100.0)
Monocytes Absolute: 0.4 10*3/uL (ref 0.2–1.0)
Monocytes Relative: 6 %
NEUTROS ABS: 5.8 10*3/uL (ref 1.4–6.5)
NEUTROS PCT: 75 %
Platelets: 372 10*3/uL (ref 150–440)
RBC: 4.41 MIL/uL (ref 4.40–5.90)
RDW: 12.6 % (ref 11.5–14.5)
WBC: 7.7 10*3/uL (ref 3.8–10.6)

## 2017-07-08 LAB — COMPREHENSIVE METABOLIC PANEL
ALT: 25 U/L (ref 17–63)
AST: 20 U/L (ref 15–41)
Albumin: 3.6 g/dL (ref 3.5–5.0)
Alkaline Phosphatase: 92 U/L (ref 38–126)
Anion gap: 8 (ref 5–15)
BILIRUBIN TOTAL: 0.4 mg/dL (ref 0.3–1.2)
BUN: 26 mg/dL — AB (ref 6–20)
CHLORIDE: 102 mmol/L (ref 101–111)
CO2: 27 mmol/L (ref 22–32)
CREATININE: 1.34 mg/dL — AB (ref 0.61–1.24)
Calcium: 9 mg/dL (ref 8.9–10.3)
GFR calc Af Amer: 59 mL/min — ABNORMAL LOW (ref >60)
GFR, EST NON AFRICAN AMERICAN: 51 mL/min — AB (ref >60)
GLUCOSE: 125 mg/dL — AB (ref 65–99)
Potassium: 4.6 mmol/L (ref 3.5–5.1)
Sodium: 137 mmol/L (ref 135–145)
TOTAL PROTEIN: 7.1 g/dL (ref 6.5–8.1)

## 2017-07-08 LAB — LACTATE DEHYDROGENASE: LDH: 162 U/L (ref 98–192)

## 2017-07-08 NOTE — Assessment & Plan Note (Signed)
#   Malignancy- unknown primary/histology- biopsy of the posterior mediastinal mass [approximately 10 cm mass- extending above and below the diaphragm on the right side]- positive for epithelioid/spindle cell component- differential includes carcinoma/mesothelioma/sarcoma. Discussed with Dr.Olney; pathology was performed. Given the necrotic/nonviable tissue- unable to process for testing. Clinically, i'm suspicious of mesothelioma.  # Given the diagnostic difficulty-I would recommend a PET scan for further evaluation. A PET scan might help us further delineate site of the biopsy; also help for staging purposes.  # Discussed the patient and wife that- patient will likely need chemotherapy; however- given the difficulty with tissue diagnosis [as discussed above]- further details cannot be discussed this time.  # Large right-sided pleural effusion- likely malignant; cytology 4 negative. Status post Pleurx catheter/pleurodesis. No significant output noted.  # Check labs today including LDH. Patient will be contacted after the PET scan; to discuss the next plan of care/repeat biopsy. We'll also review the imaging at the tumor conference. Also, we'll have lung nurse navigator- introduce to the patient.   # Thank you Dr.Oaks for allowing me to participate in the care of your pleasant patient. Please do not hesitate to contact me with questions or concerns in the interim.  # I reviewed the blood work- with the patient in detail; also reviewed the imaging independently [as summarized above]; and with the patient in detail.    Cc; Dr.Oak/Dr.Baboff/Dr.Fleming.  

## 2017-07-08 NOTE — Progress Notes (Signed)
[facility] NOTE  Patient Care Team: Derinda Late, MD as PCP - General (Family Medicine) Margaretha Sheffield, MD (Otolaryngology)  CHIEF COMPLAINTS/PURPOSE OF CONSULTATION: Malignant pleural effusion.   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     # Recurrent right sided pleural effusion x4 cytology-NEG; aug 1st talc pleurodesis/pleurex cath  # Hx of CVA Right hand; no residual deficits [left sided carotid block; no surgery done]- on [place]a/plavix; CKD- stage III; DM-2; ? PN     Carcinoma of unknown primary ([place])     HISTORY OF PRESENTING ILLNESS:  Ian Shaffer 71 y.o.  male with a history of CAD CKD-III and a history of recurrent right-sided pleural effusion- has been referred to [place] for further evaluation and recommendations for malignancy.  Patient has noted worsening shortness of breath or cough or the last 3 months also. Patient was also evaluated by pulmonary Dr. Raul Del.Patient had  thoracentesis4 in the last 3 months; with negative cytologies. CT scan done in July 2018 showed- a large mass posterior aspect of the chest extending through the diaphragm into the retroperitoneal space.  Eventually patient underwent, thoracoscopic biopsy of the posterior right chest wall lesion- and also had talc pleurodesis/with Pleurx catheter placement.  Patient's shortness of breath is improved. He is having minimal drainage from the Pleurx catheter. He denies any pain.  Patient has chronic mild tingling and numbness in his lower extremities; which he attributes to history of diabetes. He states to have lost about 10 pounds in the last few months. Otherwise denies any nausea vomiting headaches. His fairly active for his age.  ROS: A complete 10 point review of system is done  which is negative except mentioned above in history of present illness  MEDICAL HISTORY:  Past Medical History:  Diagnosis Date  . Benign essential hypertension 05/31/2014  . Cerebral infarction ([place]) 05/31/2014  . Cerebrovascular disease 05/31/2014  . CKD (chronic kidney disease) stage 3, GFR 30-59 ml/min 02/23/2016  . CVA (cerebral infarction)   . Diabetes mellitus without complication ([place])   . Difficult intubation   . DVT (deep venous thrombosis) ([place])   . Esophageal reflux 05/31/2014  . H/O: CVA (cerebrovascular accident)   . Hyperlipidemia   . Hypertension   . ICAO (internal carotid artery occlusion) March 01, 2014   Left  . 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 Pleural effusion 04/23/2017  . Stroke [facility]) April, 4,2015  . Type 2 diabetes mellitus with peripheral neuropathy ([place]) 02/23/2016  . Weakness     SURGICAL HISTORY: Past Surgical History:  Procedure Laterality Date  . CHEST TUBE INSERTION N/A 06/25/2017   Procedure: BTDVVO CATHETER INSERTION;  Surgeon: Nestor Lewandowsky, MD;  Location: [facility] ORS;  Service: Thoracic;  Laterality: N/A;  . NASAL SINUS SURGERY  2000  . TONSILLECTOMY    . VIDEO ASSISTED THORACOSCOPY Right 06/25/2017   Procedure: VIDEO ASSISTED THORACOSCOPY WITH TALC PLEURODESIS, CHEST TUBE INSERTION;  Surgeon: Nestor Lewandowsky, MD;  Location: [facility] ORS;  Service: Thoracic;  Laterality: Right;    SOCIAL HISTORY: no smoke/ no alcohol ; lives in [place]; retd 9 years ago; retd [employer]-- dad had furnace- ? 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 No kids..  Social History   Social History  . Marital status: Married    Spouse  name: N/A  . Number of children: N/A  . Years of education: N/A   Occupational History  . Not on file.   Social History Main Topics  . Smoking status: Former Smoker    Types: Pipe    Quit date: 05/07/1974  . Smokeless tobacco: Never Used     Comment: pt states he smoked a pipe a long time  ago  . Alcohol use No  . Drug use: No  . Sexual activity: Not on file   Other Topics Concern  . Not on file   Social History Narrative  . No narrative on file    FAMILY HISTORY: Family History  Problem Relation Age of Onset  . Hypertension Mother   . Hypertension Father     ALLERGIES:  is allergic to sulfa antibiotics; adhesive [tape]; and amoxicillin.  MEDICATIONS:  Current Outpatient Prescriptions  Medication Sig Dispense Refill  . amLODipine (NORVASC) 5 MG tablet amlodipine 5 mg tablet in am.    . aspirin 81 MG tablet Take 81 mg by mouth daily. In am.    . clopidogrel (PLAVIX) 75 MG tablet clopidogrel 75 mg tablet in am.    . fluticasone (FLONASE) 50 MCG/ACT nasal spray Place 1 spray into both nostrils daily.    . Liraglutide (VICTOZA [place]) Inject 1.2 mg into the skin daily. In am.    . lisinopril (PRINIVIL,ZESTRIL) 10 MG tablet TAKE 1 TABLET BY MOUTH DAILY in am.    . metoprolol succinate (TOPROL-XL) 25 MG 24 hr tablet TAKE 1 TABLET BY MOUTH DAILY    . Multiple Vitamins-Minerals (CENTRUM SILVER PO) Take 1 tablet by mouth daily.    . pantoprazole (PROTONIX) 40 MG tablet TAKE 1 TABLET BY MOUTH DAILY in am.    . polyethylene glycol (MIRALAX / GLYCOLAX) packet Take 17 g by mouth daily.    . pravastatin (PRAVACHOL) 40 MG tablet TAKE 1 TABLET BY MOUTH DAILY in am.    . ranitidine (ZANTAC) 150 MG capsule Take 150 mg by mouth every evening.    . diphenhydrAMINE (BENADRYL) 25 MG tablet Take 50 mg by mouth every 6 (six) hours as needed for itching.    Marland Kitchen oxyCODONE-acetaminophen (PERCOCET/ROXICET) 5-325 MG tablet Take 1 tablet by mouth every 6 (six) hours as needed.     No current facility-administered medications for this visit.       Marland Kitchen  PHYSICAL EXAMINATION: ECOG PERFORMANCE STATUS: 0 - Asymptomatic  Vitals:   07/08/17 1129  BP: 135/75  Pulse: 72  Temp: 98.3 F (36.8 C)  SpO2: 99%   Filed Weights   07/08/17 1129  Weight: 197 lb (89.4 kg)    GENERAL: Well-nourished  well-developed; Alert, no distress and comfortable.   Accompanied by his wife. EYES: no pallor or icterus OROPHARYNX: no thrush or ulceration; good dentition  NECK: supple, no masses felt LYMPH:  no palpable lymphadenopathy in the cervical, axillary or inguinal regions LUNGS: Decreased breath sounds on the right side compared to the left. No wheeze or crackles HEART/CVS: regular rate & rhythm and no murmurs; No lower extremity edema ABDOMEN: abdomen soft, non-tender and normal bowel sounds Musculoskeletal:no cyanosis of digits and no clubbing  PSYCH: alert & oriented x 3 with fluent speech NEURO: no focal motor/sensory deficits SKIN:  no rashes or significant lesions  LABORATORY DATA:  I have reviewed the data as listed Lab Results  Component Value Date   WBC 7.7 07/08/2017   HGB 12.9 (L) 07/08/2017   HCT 38.5 (L) 07/08/2017  MCV 87.2 07/08/2017   PLT 372 07/08/2017    Recent Labs  04/23/17 0433 06/18/17 1209 06/23/17 1138 07/08/17 1236  NA 140  --  137 137  K 4.0  --  4.6 4.6  CL 108  --  103 102  CO2 26  --  24 27  GLUCOSE 100*  --  129* 125*  BUN 24*  --  21* 26*  CREATININE 1.30* 1.40* 1.32* 1.34*  CALCIUM 9.1  --  9.3 9.0  GFRNONAA 53*  --  52* 51*  GFRAA >60  --  >60 59*  PROT  --   --  7.2 7.1  ALBUMIN  --   --  3.7 3.6  AST  --   --  18 20  ALT  --   --  18 25  ALKPHOS  --   --  80 92  BILITOT  --   --  0.4 0.4    RADIOGRAPHIC STUDIES: I have personally reviewed the radiological images as listed and agreed with the findings in the report. Dg Chest 1 View  Result Date: 06/23/2017 CLINICAL DATA:  Status post right thoracentesis. EXAM: CHEST 1 VIEW COMPARISON:  Earlier today. FINDINGS: Small right pleural effusion with a significant decrease in size. No pneumothorax. Linear atelectasis or scarring in the right mid lung zone, without significant change. Clear left lung. Minimal central peribronchial thickening. Mild thoracic and upper lumbar spine degenerative  changes. IMPRESSION: 1. No pneumothorax following right thoracentesis. 2. Minimal residual right pleural fluid. 3. Stable linear atelectasis or scarring in the right mid lung zone with resolved right basilar atelectasis. Electronically Signed   By: Claudie Revering M.D.   On: 06/23/2017 21:52   Dg Chest 2 View  Result Date: 07/04/2017 CLINICAL DATA:  Follow-up RIGHT pleural effusion, catheter EXAM: CHEST  2 VIEW COMPARISON:  06/25/2017 FINDINGS: PleurX catheter at inferior RIGHT hemithorax laterally. RIGHT pleural effusion and atelectasis again identified at base slightly increased since previous study. Stable heart size and mediastinal contours. Bronchitic changes without infiltrate or pneumothorax. Probable LEFT nipple shadow, identified on lateral view as well. No acute osseous findings. IMPRESSION: Increased RIGHT pleural effusion and basilar atelectasis since previous study. Electronically Signed   By: Lavonia Dana M.D.   On: 07/04/2017 13:36   Dg Chest 2 View  Result Date: 06/23/2017 CLINICAL DATA:  Right pleural effusion.  Shortness of breath. EXAM: CHEST  2 VIEW COMPARISON:  Chest radiograph June 12, 2017; chest CT June 18, 2017 FINDINGS: There is a fairly sizable pleural effusion on the right with atelectatic change in the right mid and lower lung zones. Atelectasis also noted in the anterior segment of the right upper lobe. Left lung is clear. Heart size and pulmonary vascularity are normal. No adenopathy. There is degenerative change in the thoracic spine. IMPRESSION: Fairly sizable pleural effusion on the right with areas of atelectatic change in the right middle and lower lobes. There is also focal atelectatic change in the anterior segment right upper lobe. Left lung clear. Cardiac silhouette within normal limits. Electronically Signed   By: Lowella Grip III M.D.   On: 06/23/2017 14:23   Dg Chest 2 View  Result Date: 06/12/2017 CLINICAL DATA:  71 year old male with recurrent large volume  right-sided pleural effusion. Status post right thoracentesis. EXAM: CHEST  2 VIEW COMPARISON:  Prior chest x-ray 05/30/2017 FINDINGS: No evidence of pneumothorax. Persistent small to moderate right pleural effusion with associated right lower lobe atelectasis. The cardiac mediastinal contours are normal.  The aerated portion of the lungs are within normal limits. No acute osseous abnormality. IMPRESSION: No evidence of pneumothorax following thoracentesis. Electronically Signed   By: Jacqulynn Cadet M.D.   On: 06/12/2017 11:25   Ct Chest W Contrast  Result Date: 06/18/2017 CLINICAL DATA:  Cough with shortness of breath for 3 weeks. Thoracentesis x3 over the last 3 weeks (last done today). No history of malignancy. EXAM: CT CHEST WITH CONTRAST TECHNIQUE: Multidetector CT imaging of the chest was performed during intravenous contrast administration. CONTRAST:  18mL ISOVUE-300 IOPAMIDOL (ISOVUE-300) INJECTION 61% COMPARISON:  Chest radiographs 06/12/2017 FINDINGS: Cardiovascular: Mild coronary artery atherosclerosis. No acute vascular findings are demonstrated. The heart size is normal. There is no pericardial effusion. Mediastinum/Nodes: There are no enlarged mediastinal, hilar or axillary lymph nodes.There is a mildly prominent high right paratracheal node, measuring 8 mm on image 34. The thyroid gland, trachea and esophagus demonstrate no significant findings. Lungs/Pleura: There is a moderate size dependent pleural effusion on the right. No evidence of pneumothorax. There is a pleural based mass posteriorly in the lower right hemithorax which appears to traverse the right hemidiaphragm. The intrathoracic component has relatively low density, measuring 5.5 x 3.7 cm on image 118 and may in part be due to hematoma. The subdiaphragmatic component appears more solid, measuring up to 3.4 x 3.4 cm on image 144. The cephalocaudad extent of this process is approximately 9.9 cm on coronal image 117. No significant  pleural fluid on the left. No pleural plaque formation or calcifications identified. There is mild atelectasis in the right middle and lower lobes. No lung mass or endobronchial lesion is seen. The left lung is clear. Upper abdomen: As above, there is a mass which appears to traverse the right hemidiaphragm posteriorly. There is a retroperitoneal component posterior to the liver which indents the liver capsule, but does not clearly extend into the liver parenchyma. This is separate from the right adrenal gland and upper pole of the right kidney which appear normal. No intrinsic hepatic lesions are seen. Musculoskeletal/Chest wall: There is no chest wall mass or suspicious osseous finding. Glenohumeral degenerative changes are noted on the left. IMPRESSION: 1. Complex mass involving the right hemidiaphragm posteriorly with intrathoracic and retroperitoneal components. Mass abuts the liver, but does not clearly invade it. 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 Metastatic disease, sarcoma and lymphoma consider less likely. 2. No underlying sequela of prior asbestos exposure identified. 3. Moderate size right pleural effusion. 4. Mild atelectasis at the right lung base. No lung nodules or suspicious thoracic adenopathy. Electronically Signed   By: Richardean Sale M.D.   On: 06/18/2017 12:43   Dg Chest Port 1 View  Result Date: 06/25/2017 CLINICAL DATA:  71 year old male status post Right Side Video-assisted Fluoroscopy With Pleurodesis. Postop day Zero. EXAM: PORTABLE CHEST 1 VIEW COMPARISON:  06/23/2017 and earlier. FINDINGS: Portable AP upright view at 1227 hours. 1 or 2 right lower lung chest tubes are in place. Probable postoperative changes to the nearby right lower lateral ribs. No pneumothorax. No pulmonary edema. No residual pleural effusion is evident. Interval improved right perihilar ventilation, but platelike left perihilar atelectasis has developed. Otherwise mild streaky bibasilar  opacity. Mediastinal contours remain within normal limits. Negative visible bowel gas pattern. IMPRESSION: 1. Postoperative changes to the right lower chest with 1 versus 2 chest tubes in place. No pneumothorax or residual pleural effusion. 2. Mild bibasilar and left perihilar opacity, probably atelectasis. Electronically Signed   By: Herminio Heads.D.  On: 06/25/2017 12:45   US Thoracentesis Asp Pleural Space W/img Guide  Result Date: 06/23/2017 INDICATION: RECURRENT MALIGNANT RIGHT PLEURAL EFFUSION EXAM: ULTRASOUND GUIDED RIGHT THORACENTESIS MEDICATIONS: 1% LIDOCAINE LOCALLY COMPLICATIONS: None immediate. PROCEDURE: An ultrasound guided thoracentesis was thoroughly discussed with the patient and questions answered. The benefits, risks, alternatives and complications were also discussed. The patient understands and wishes to proceed with the procedure. Written consent was obtained. Ultrasound was performed to localize and mark an adequate pocket of fluid in the right chest. The area was then prepped and draped in the normal sterile fashion. 1% Lidocaine was used for local anesthesia. Under ultrasound guidance a 6 Fr Safe-T-Centesis catheter was introduced. Thoracentesis was performed. The catheter was removed and a dressing applied. FINDINGS: A total of approximately 2.5 L of BLOOD-TINGED PLEURAL fluid was removed. Samples were sent to the laboratory as requested by the clinical team. IMPRESSION: Successful ultrasound guided right thoracentesis yielding 2.5 L of pleural fluid. Electronically Signed   By: Jerilynn Mages.  Shick M.D.   On: 06/23/2017 16:04   US Thoracentesis Asp Pleural Space W/img Guide  Result Date: 06/18/2017 INDICATION: 71 year old male with a history of recurrent right-sided pleural effusion EXAM: ULTRASOUND GUIDED RIGHT THORACENTESIS MEDICATIONS: None. COMPLICATIONS: None PROCEDURE: An ultrasound guided thoracentesis was thoroughly discussed with the patient and questions answered. The benefits, risks,  alternatives and complications were also discussed. The patient understands and wishes to proceed with the procedure. Written consent was obtained. Ultrasound was performed to localize and mark an adequate pocket of fluid in the right chest. The area was then prepped and draped in the normal sterile fashion. 1% Lidocaine was used for local anesthesia. Under ultrasound guidance a 8 Fr Safe-T-Centesis catheter was introduced. Thoracentesis was performed. The catheter was removed and a dressing applied. FINDINGS: A total of approximately 2,200 CC of amber fluid was removed. Samples were sent to the laboratory as requested by the clinical team. IMPRESSION: Status post ultrasound-guided right thoracentesis. Signed, Dulcy Fanny. Earleen Newport, DO [facility] Electronically Signed   By: Corrie Mckusick D.O.   On: 06/18/2017 12:24   US Thoracentesis Asp Pleural Space W/img Guide  Result Date: 06/12/2017 INDICATION: 71 year old male with recurrent symptomatic right-sided pleural effusion. EXAM: ULTRASOUND GUIDED RIGHT THORACENTESIS MEDICATIONS: None. COMPLICATIONS: None immediate. PROCEDURE: An ultrasound guided thoracentesis was thoroughly discussed with the patient and questions answered. The benefits, risks, alternatives and complications were also discussed. The patient understands and wishes to proceed with the procedure. Written consent was obtained. Ultrasound was performed to localize and mark an adequate pocket of fluid in the right chest. The area was then prepped and draped in the normal sterile fashion. 1% Lidocaine was used for local anesthesia. Under ultrasound guidance a 6 Fr Safe-T-Centesis catheter was introduced. Thoracentesis was performed. The catheter was removed and a dressing applied. FINDINGS: A total of approximately 2.4 L of dark amber colored pleural fluid was removed. Samples were sent to the laboratory as requested by the clinical team.  IMPRESSION: Successful ultrasound guided right thoracentesis yielding 2.4 L of pleural fluid. No further fluid was drawn off at that time due to the relatively high volume. Ultrasound confirms there is still residual fluid in the right pleural space. Electronically Signed   By: Jacqulynn Cadet M.D.   On: 06/12/2017 11:24    ASSESSMENT & PLAN:   Carcinoma of unknown primary ([place]) # Malignancy- unknown primary/histology- biopsy of the posterior mediastinal mass [approximately 10 cm mass- extending above and below the diaphragm  on the right side]- positive for epithelioid/spindle cell component- differential includes carcinoma/mesothelioma/sarcoma. Discussed with Dr.Olney; pathology was performed. Given the necrotic/nonviable tissue- unable to process for testing. Clinically, i'm suspicious of mesothelioma.  # Given the diagnostic difficulty-I would recommend a PET scan for further evaluation. A PET scan might help [place] further delineate site of the biopsy; also help for staging purposes.  # Discussed the patient and wife that- patient will likely need chemotherapy; however- given the difficulty with tissue diagnosis [as discussed above]- further details cannot be discussed this time.  # Large right-sided pleural effusion- likely malignant; cytology 4 negative. Status post Pleurx catheter/pleurodesis. No significant output noted.  # Check labs today including LDH. Patient will be contacted after the PET scan; to discuss the next plan of care/repeat biopsy. We'll also review the imaging at the tumor conference. Also, we'll have lung nurse navigator- introduce to the patient.   # Thank you Dr.Oaks for allowing me to participate in the care of your pleasant patient. Please do not hesitate to contact me with questions or concerns in the interim.  # I reviewed the blood work- with the patient in detail; also reviewed the imaging independently [as summarized above]; and with the patient in detail.     Cc; Dr.Oak/Dr.Baboff/Dr.Fleming.   All questions were answered. The patient knows to call the clinic with any problems, questions or concerns.    Cammie Sickle, MD 07/08/2017 10:30 PM

## 2017-07-09 ENCOUNTER — Telehealth: Payer: Self-pay

## 2017-07-09 ENCOUNTER — Encounter
Admission: RE | Admit: 2017-07-09 | Discharge: 2017-07-09 | Disposition: A | Discharge status: Home / Self Care | Payer: Medicare Other | Source: Ambulatory Visit | Attending: Internal Medicine | Admitting: Internal Medicine

## 2017-07-09 DIAGNOSIS — C801 Malignant (primary) neoplasm, unspecified: Secondary | ICD-10-CM

## 2017-07-09 LAB — GLUCOSE, CAPILLARY: Glucose-Capillary: 137 mg/dL — ABNORMAL HIGH (ref 65–99)

## 2017-07-09 MED ORDER — FLUDEOXYGLUCOSE F - 18 (FDG) INJECTION
12.0000 | Freq: Once | INTRAVENOUS | Status: AC | PRN
  Administered 2017-07-09: 12.93 via INTRAVENOUS

## 2017-07-09 NOTE — Telephone Encounter (Signed)
Angel from [facility] called and wanted Dr. Genevive Bi to know that she didn't get much drainage from his Pleurx. She stated that the drainage didn't even go into the bottle it stated in the tubing which equaled out to be about 2cc.

## 2017-07-10 ENCOUNTER — Other Ambulatory Visit: Payer: Self-pay | Admitting: Internal Medicine

## 2017-07-10 ENCOUNTER — Encounter: Payer: Self-pay | Admitting: *Deleted

## 2017-07-10 DIAGNOSIS — C801 Malignant (primary) neoplasm, unspecified: Secondary | ICD-10-CM

## 2017-07-10 LAB — FUNGUS CULTURE RESULT

## 2017-07-10 LAB — FUNGUS CULTURE WITH STAIN

## 2017-07-10 LAB — FUNGAL ORGANISM REFLEX

## 2017-07-10 NOTE — Progress Notes (Signed)
Pt is aware of plan and is agreeable.

## 2017-07-10 NOTE — Progress Notes (Signed)
I will take care of it.  Heather, if you could fax the biopsy checklist, I will work on getting him scheduled and will schedule follow up with Dr. B to review results.

## 2017-07-10 NOTE — Progress Notes (Signed)
  Oncology Nurse Navigator Documentation  Navigator Location: [facility] (07/10/17 1400) Referral date to RadOnc/MedOnc: 07/04/17 (07/10/17 1400) )Navigator Encounter Type: Introductory phone call (07/10/17 1400)   Abnormal Finding Date: 04/22/17 (07/10/17 1400)                     Barriers/Navigation Needs: Coordination of Care (07/10/17 1400)   Interventions: Coordination of Care (07/10/17 1400)   Coordination of Care: Appts;Radiology (07/10/17 1400)        Acuity: Level 2 (07/10/17 1400)   Acuity Level 2: Assistance expediting appointments;Initial guidance, education and coordination as needed;Educational needs (07/10/17 1400)    phone call made to patient to introduce to navigator services. Recommendations from tumor conference discussed with patient and pt is agreeable with plan. Informed pt that can expect phone call with biopsy appt and follow up appt. Contact info given to pt and informed to call with any further questions. Pt verbalized understanding.  Time Spent with Patient: 30 (07/10/17 1400)

## 2017-07-10 NOTE — Progress Notes (Signed)
Ian Shaffer- please inform patient that his case was discussed with the tumor conference. Plan is to proceed with a CT-guided biopsy with radiology.   I put in the order for the CT-guided biopsy; please have it scheduled ASAP.  Schedule follow up with me in 3-4 days post biopsy.

## 2017-07-11 ENCOUNTER — Telehealth: Payer: Self-pay | Admitting: Internal Medicine

## 2017-07-11 ENCOUNTER — Ambulatory Visit: Payer: Medicare Other

## 2017-07-11 VITALS — Ht 70.0 in

## 2017-07-11 DIAGNOSIS — Z5189 Encounter for other specified aftercare: Secondary | ICD-10-CM

## 2017-07-11 NOTE — Telephone Encounter (Signed)
Kizzie Fantasia, RN also has reach out to patient to leave vm regarding plan of care when to stop plavix. Unable to leave vm for patient on cell phone as vm not set up per Juanita.

## 2017-07-11 NOTE — Telephone Encounter (Signed)
Spoke to IR re: plavix/ Bx; reviwed note from card; Dr.Parashoes. Discussed with IR.

## 2017-07-11 NOTE — Progress Notes (Signed)
Pleurx drained 55ml pleural fluid. Suture removed from pleurx catheter, dressing changed.  All done without difficulty.  Reviewed patient's upcoming Biopsy information, when to stop plavix, and order changes involving current pleurx. All questions answered.  Time spent with patient: 30 Minutes

## 2017-07-11 NOTE — Patient Instructions (Signed)
Do not take your Plavix or Aspirin today through 07/16/17. You will be given instructions at discharge from your Biopsy in regards to restarting these medications.  We will see you back as scheduled below.   You may STOP draining your Pleurx but continue to change your dressing every other day. I will touch base with home Health today and make these changes on your orders.  Once it has been determined if you need a port or not, we will discuss surgery to do port and pleurx removal at same time or just pleurx removal if no port is indicated.  Please call with any questions or concerns prior to your next appointment.

## 2017-07-14 ENCOUNTER — Telehealth: Payer: Self-pay

## 2017-07-14 NOTE — Telephone Encounter (Signed)
Notification sent to [facility]) with update on orders and plan of care. Awaiting response currently.

## 2017-07-14 NOTE — Telephone Encounter (Addendum)
Spoke with Summer. She was given verbal orders to STOP pleurx drainage, continue with dressing changes every other day, and to keep appointment with follow-up for 07/25/17 with Dr. Genevive Bi. As soon as biopsy results are obtained and Dr. Genevive Bi speaks with Oncology, he will be scheduled for pleurx to be d/c'd.

## 2017-07-15 ENCOUNTER — Other Ambulatory Visit: Payer: Self-pay | Admitting: Radiology

## 2017-07-16 ENCOUNTER — Ambulatory Visit
Admission: RE | Admit: 2017-07-16 | Discharge: 2017-07-16 | Disposition: A | Discharge status: Home / Self Care | Payer: Medicare Other | Source: Ambulatory Visit | Attending: Internal Medicine | Admitting: Internal Medicine

## 2017-07-16 DIAGNOSIS — Z8673 Personal history of transient ischemic attack (TIA), and cerebral infarction without residual deficits: Secondary | ICD-10-CM | POA: Diagnosis not present

## 2017-07-16 DIAGNOSIS — J90 Pleural effusion, not elsewhere classified: Secondary | ICD-10-CM | POA: Diagnosis not present

## 2017-07-16 DIAGNOSIS — Z888 Allergy status to other drugs, medicaments and biological substances status: Secondary | ICD-10-CM | POA: Diagnosis not present

## 2017-07-16 DIAGNOSIS — Z9889 Other specified postprocedural states: Secondary | ICD-10-CM | POA: Insufficient documentation

## 2017-07-16 DIAGNOSIS — N183 Chronic kidney disease, stage 3 (moderate): Secondary | ICD-10-CM | POA: Diagnosis not present

## 2017-07-16 DIAGNOSIS — Z882 Allergy status to sulfonamides status: Secondary | ICD-10-CM | POA: Diagnosis not present

## 2017-07-16 DIAGNOSIS — I129 Hypertensive chronic kidney disease with stage 1 through stage 4 chronic kidney disease, or unspecified chronic kidney disease: Secondary | ICD-10-CM | POA: Diagnosis not present

## 2017-07-16 DIAGNOSIS — I679 Cerebrovascular disease, unspecified: Secondary | ICD-10-CM | POA: Diagnosis not present

## 2017-07-16 DIAGNOSIS — Z8249 Family history of ischemic heart disease and other diseases of the circulatory system: Secondary | ICD-10-CM | POA: Diagnosis not present

## 2017-07-16 DIAGNOSIS — E1122 Type 2 diabetes mellitus with diabetic chronic kidney disease: Secondary | ICD-10-CM | POA: Insufficient documentation

## 2017-07-16 DIAGNOSIS — K219 Gastro-esophageal reflux disease without esophagitis: Secondary | ICD-10-CM | POA: Diagnosis not present

## 2017-07-16 DIAGNOSIS — Z87891 Personal history of nicotine dependence: Secondary | ICD-10-CM | POA: Insufficient documentation

## 2017-07-16 DIAGNOSIS — C480 Malignant neoplasm of retroperitoneum: Secondary | ICD-10-CM | POA: Diagnosis not present

## 2017-07-16 DIAGNOSIS — Z7982 Long term (current) use of aspirin: Secondary | ICD-10-CM | POA: Insufficient documentation

## 2017-07-16 DIAGNOSIS — E785 Hyperlipidemia, unspecified: Secondary | ICD-10-CM | POA: Diagnosis not present

## 2017-07-16 DIAGNOSIS — R1900 Intra-abdominal and pelvic swelling, mass and lump, unspecified site: Secondary | ICD-10-CM | POA: Insufficient documentation

## 2017-07-16 DIAGNOSIS — Z79899 Other long term (current) drug therapy: Secondary | ICD-10-CM | POA: Diagnosis not present

## 2017-07-16 DIAGNOSIS — Z880 Allergy status to penicillin: Secondary | ICD-10-CM | POA: Diagnosis not present

## 2017-07-16 DIAGNOSIS — C801 Malignant (primary) neoplasm, unspecified: Secondary | ICD-10-CM

## 2017-07-16 LAB — CBC
HCT: 36 % — ABNORMAL LOW (ref 40.0–52.0)
Hemoglobin: 12.4 g/dL — ABNORMAL LOW (ref 13.0–18.0)
MCH: 29.6 pg (ref 26.0–34.0)
MCHC: 34.4 g/dL (ref 32.0–36.0)
MCV: 86 fL (ref 80.0–100.0)
PLATELETS: 285 10*3/uL (ref 150–440)
RBC: 4.19 MIL/uL — ABNORMAL LOW (ref 4.40–5.90)
RDW: 12.6 % (ref 11.5–14.5)
WBC: 4.5 10*3/uL (ref 3.8–10.6)

## 2017-07-16 LAB — PROTIME-INR
INR: 1
PROTHROMBIN TIME: 13.2 s (ref 11.4–15.2)

## 2017-07-16 LAB — APTT: APTT: 27 s (ref 24–36)

## 2017-07-16 MED ORDER — FENTANYL CITRATE (PF) 100 MCG/2ML IJ SOLN
INTRAMUSCULAR | Status: AC | PRN
  Administered 2017-07-16: 50 ug via INTRAVENOUS

## 2017-07-16 MED ORDER — SODIUM CHLORIDE 0.9 % IV SOLN
INTRAVENOUS | Status: DC
  Administered 2017-07-16: 10:00:00 via INTRAVENOUS

## 2017-07-16 MED ORDER — MIDAZOLAM HCL 5 MG/5ML IJ SOLN
INTRAMUSCULAR | Status: AC
  Filled 2017-07-16: qty 5

## 2017-07-16 MED ORDER — MIDAZOLAM HCL 5 MG/5ML IJ SOLN
INTRAMUSCULAR | Status: AC | PRN
  Administered 2017-07-16 (×2): 1 mg via INTRAVENOUS

## 2017-07-16 MED ORDER — FENTANYL CITRATE (PF) 100 MCG/2ML IJ SOLN
INTRAMUSCULAR | Status: AC
  Filled 2017-07-16: qty 4

## 2017-07-16 NOTE — H&P (Signed)
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 Patient is status post pleural biopsy and right Pleurx drain by CT surgery. Pathology was insufficient. Plan today is for additional CT core biopsy. Clinically he is stable. No current complaints.  Past Medical History:  Diagnosis Date  . Benign essential hypertension 05/31/2014  . Cerebral infarction ([place]) 05/31/2014  . Cerebrovascular disease 05/31/2014  . CKD (chronic kidney disease) stage 3, GFR 30-59 ml/min 02/23/2016  . CVA (cerebral infarction)   . Diabetes mellitus without complication ([place])   . Difficult intubation   . DVT (deep venous thrombosis) ([place])   . Esophageal reflux 05/31/2014  . H/O: CVA (cerebrovascular accident)   . Hyperlipidemia   . Hypertension   . ICAO (internal carotid artery occlusion) March 01, 2014   Left  . 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 Pleural effusion 04/23/2017  . Stroke [facility]) April, 4,2015  . Type 2 diabetes mellitus with peripheral neuropathy ([place]) 02/23/2016  . Weakness     Past Surgical History:  Procedure Laterality Date  . CHEST TUBE INSERTION N/A 06/25/2017   Procedure: QHUTML CATHETER INSERTION;  Surgeon: Nestor Lewandowsky, MD;  Location: [facility] ORS;  Service: Thoracic;  Laterality: N/A;  . NASAL SINUS SURGERY  2000  . TONSILLECTOMY    . VIDEO ASSISTED THORACOSCOPY Right 06/25/2017   Procedure: VIDEO ASSISTED THORACOSCOPY WITH TALC PLEURODESIS, CHEST TUBE INSERTION;  Surgeon: Nestor Lewandowsky, MD;  Location: [facility] ORS;  Service: Thoracic;  Laterality: Right;    Allergies: Sulfa antibiotics; Adhesive [tape]; and Amoxicillin  Medications: Prior to Admission  medications   Medication Sig Start Date End Date Taking? Authorizing Provider  amLODipine (NORVASC) 5 MG tablet amlodipine 5 mg tablet in am.   Yes [provider]  fluticasone (FLONASE) 50 MCG/ACT nasal spray Place 1 spray into both nostrils daily.   Yes [provider]  Liraglutide (VICTOZA [place]) Inject 1.2 mg into the skin daily. In am.   Yes [provider]  lisinopril (PRINIVIL,ZESTRIL) 10 MG tablet TAKE 1 TABLET BY MOUTH DAILY in am.   Yes [provider]  metoprolol succinate (TOPROL-XL) 25 MG 24 hr tablet TAKE 1 TABLET BY MOUTH DAILY   Yes [provider]  Multiple Vitamins-Minerals (CENTRUM SILVER PO) Take 1 tablet by mouth daily.   Yes [provider]  oxyCODONE-acetaminophen (PERCOCET/ROXICET) 5-325 MG tablet Take 1 tablet by mouth every 6 (six) hours as needed. 06/27/17  Yes [provider]  pantoprazole (PROTONIX) 40 MG tablet TAKE 1 TABLET BY MOUTH DAILY in am.   Yes [provider]  polyethylene glycol (MIRALAX / GLYCOLAX) packet Take 17 g by mouth daily.   Yes [provider]  pravastatin (PRAVACHOL) 40 MG tablet TAKE 1 TABLET BY MOUTH DAILY in am.   Yes [provider]  aspirin 81 MG tablet Take 81 mg by mouth daily. In am.    [provider]  clopidogrel (PLAVIX) 75 MG tablet clopidogrel 75 mg tablet in am.    [provider]  diphenhydrAMINE (BENADRYL) 25 MG tablet Take 50 mg by mouth every 6 (six) hours as needed for itching.    [provider]  ranitidine (ZANTAC) 150 MG capsule Take 150  mg by mouth every evening.    [provider]     Family History  Problem Relation Age of Onset  . Hypertension Mother   . Hypertension Father     Social History   Social History  . Marital status: Married    Spouse name: N/A  . Number of children: N/A  . Years of education: N/A   Social History Main Topics  . Smoking status: Former Smoker    Types: Pipe     Quit date: 05/07/1974  . Smokeless tobacco: Never Used     Comment: pt states he smoked a pipe a long time ago  . Alcohol use No  . Drug use: No  . Sexual activity: Not Asked   Other Topics Concern  . None   Social History Narrative  . None    ECOG Status: 0 - Asymptomatic  Review of Systems: A 12 point ROS discussed and pertinent positives are indicated in the HPI above.  All other systems are negative.  Review of Systems  Vital Signs: BP 128/78   Pulse 72   Temp 98.2 F (36.8 C) (Oral)   Resp 11   SpO2 97%   Physical Exam  RRR CTA b NBGS Ext wnl  Mallampati Score:   2  Imaging: Dg Chest 1 View  Result Date: 06/23/2017 CLINICAL DATA:  Status post right thoracentesis. EXAM: CHEST 1 VIEW COMPARISON:  Earlier today. FINDINGS: Small right pleural effusion with a significant decrease in size. No pneumothorax. Linear atelectasis or scarring in the right mid lung zone, without significant change. Clear left lung. Minimal central peribronchial thickening. Mild thoracic and upper lumbar spine degenerative changes. IMPRESSION: 1. No pneumothorax following right thoracentesis. 2. Minimal residual right pleural fluid. 3. Stable linear atelectasis or scarring in the right mid lung zone with resolved right basilar atelectasis. Electronically Signed   By: Claudie Revering M.D.   On: 06/23/2017 21:52   Dg Chest 2 View  Result Date: 07/04/2017 CLINICAL DATA:  Follow-up RIGHT pleural effusion, catheter EXAM: CHEST  2 VIEW COMPARISON:  06/25/2017 FINDINGS: PleurX catheter at inferior RIGHT hemithorax laterally. RIGHT pleural effusion and atelectasis again identified at base slightly increased since previous study. Stable heart size and mediastinal contours. Bronchitic changes without infiltrate or pneumothorax. Probable LEFT nipple shadow, identified on lateral view as well. No acute osseous findings. IMPRESSION: Increased RIGHT pleural effusion and basilar atelectasis since previous study.  Electronically Signed   By: Lavonia Dana M.D.   On: 07/04/2017 13:36   Dg Chest 2 View  Result Date: 06/23/2017 CLINICAL DATA:  Right pleural effusion.  Shortness of breath. EXAM: CHEST  2 VIEW COMPARISON:  Chest radiograph June 12, 2017; chest CT June 18, 2017 FINDINGS: There is a fairly sizable pleural effusion on the right with atelectatic change in the right mid and lower lung zones. Atelectasis also noted in the anterior segment of the right upper lobe. Left lung is clear. Heart size and pulmonary vascularity are normal. No adenopathy. There is degenerative change in the thoracic spine. IMPRESSION: Fairly sizable pleural effusion on the right with areas of atelectatic change in the right middle and lower lobes. There is also focal atelectatic change in the anterior segment right upper lobe. Left lung clear. Cardiac silhouette within normal limits. Electronically Signed   By: Lowella Grip III M.D.   On: 06/23/2017 14:23   Ct Chest W Contrast  Result Date: 06/18/2017 CLINICAL DATA:  Cough with shortness of breath for 3 weeks. Thoracentesis  x3 over the last 3 weeks (last done today). No history of malignancy. EXAM: CT CHEST WITH CONTRAST TECHNIQUE: Multidetector CT imaging of the chest was performed during intravenous contrast administration. CONTRAST:  58mL ISOVUE-300 IOPAMIDOL (ISOVUE-300) INJECTION 61% COMPARISON:  Chest radiographs 06/12/2017 FINDINGS: Cardiovascular: Mild coronary artery atherosclerosis. No acute vascular findings are demonstrated. The heart size is normal. There is no pericardial effusion. Mediastinum/Nodes: There are no enlarged mediastinal, hilar or axillary lymph nodes.There is a mildly prominent high right paratracheal node, measuring 8 mm on image 34. The thyroid gland, trachea and esophagus demonstrate no significant findings. Lungs/Pleura: There is a moderate size dependent pleural effusion on the right. No evidence of pneumothorax. There is a pleural based mass  posteriorly in the lower right hemithorax which appears to traverse the right hemidiaphragm. The intrathoracic component has relatively low density, measuring 5.5 x 3.7 cm on image 118 and may in part be due to hematoma. The subdiaphragmatic component appears more solid, measuring up to 3.4 x 3.4 cm on image 144. The cephalocaudad extent of this process is approximately 9.9 cm on coronal image 117. No significant pleural fluid on the left. No pleural plaque formation or calcifications identified. There is mild atelectasis in the right middle and lower lobes. No lung mass or endobronchial lesion is seen. The left lung is clear. Upper abdomen: As above, there is a mass which appears to traverse the right hemidiaphragm posteriorly. There is a retroperitoneal component posterior to the liver which indents the liver capsule, but does not clearly extend into the liver parenchyma. This is separate from the right adrenal gland and upper pole of the right kidney which appear normal. No intrinsic hepatic lesions are seen. Musculoskeletal/Chest wall: There is no chest wall mass or suspicious osseous finding. Glenohumeral degenerative changes are noted on the left. IMPRESSION: 1. Complex mass involving the right hemidiaphragm posteriorly with intrathoracic and retroperitoneal components. Mass abuts the liver, but does not clearly invade it. 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 Metastatic disease, sarcoma and lymphoma consider less likely. 2. No underlying sequela of prior asbestos exposure identified. 3. Moderate size right pleural effusion. 4. Mild atelectasis at the right lung base. No lung nodules or suspicious thoracic adenopathy. Electronically Signed   By: Richardean Sale M.D.   On: 06/18/2017 12:43   Nm Pet Image Initial (pi) Skull Base To Thigh  Result Date: 07/09/2017 CLINICAL DATA:  Initial treatment strategy for right lung mass. EXAM: NUCLEAR MEDICINE PET SKULL BASE TO THIGH TECHNIQUE: 12.9  mCi F-18 FDG was injected intravenously. Full-ring PET imaging was performed from the skull base to thigh after the radiotracer. CT data was obtained and used for attenuation correction and anatomic localization. FASTING BLOOD GLUCOSE:  Value: 137 mg/dl COMPARISON:  Multiple exams, including 06/18/2017 FINDINGS: NECK No hypermetabolic lymph nodes in the neck. CHEST Right chest tube in place with small loculated right pneumothorax and a mass along the right hemidiaphragm extending up into the pleural space and also caudad into the upper abdomen tangential to the posterior margin of the right hepatic lobe. This lesion extends into the right retroperitoneum as noted on prior CT. The dominant hypermetabolic mass like portion of this lesion is primarily intra-abdominal and measures about 9.5 by 6.0 by 5.6 cm, with maximum SUV 47.5. A there are other foci of hypermetabolic activity along the right pleural space suspicious for pleural tumor although some of the low-grade activity may be from inflammation. Scattered paratracheal lymph nodes are present including a 0.9 cm node  on image 68/4, but these are not hypermetabolic beyond background mediastinal activity. Trace right pleural gas. ABDOMEN/PELVIS As noted in the chest section above, there is a tumor along the right hemidiaphragm and pleural space extending along the posterior margin the right hepatic lobe and into the right retroperitoneum, highly hypermetabolic. This is tangential to the liver but not obviously arising from the liver. No hypermetabolic abdominal adenopathy. No other hypermetabolic tumor in the abdomen. SKELETON No focal hypermetabolic activity to suggest skeletal metastasis. IMPRESSION: 1. Pleural tumor along the right lung base appears to potentially extend through the right hemidiaphragm and into the right retroperitoneum, maximum SUV 47.5, compatible with malignancy. There other foci of hypermetabolic activity along the loculated right pleural  effusion, some of which could be reactive given the presence of the chest tube, but other foci pleural tumor are suspected. Trace amount of gas in the right pleural space along with the loculated pleural effusion. Possibilities might include mesothelioma or perhaps a peripheral right lower lobe lung cancer invading into the pleural space, hemidiaphragm, and retroperitoneum. 2. Upper normal sized paratracheal lymph nodes without hypermetabolic activity. 3. No other significant foci of hypermetabolic tumor identified. Electronically Signed   By: Van Clines M.D.   On: 07/09/2017 12:23   Dg Chest Port 1 View  Result Date: 06/25/2017 CLINICAL DATA:  71 year old male status post Right Side Video-assisted Fluoroscopy With Pleurodesis. Postop day Zero. EXAM: PORTABLE CHEST 1 VIEW COMPARISON:  06/23/2017 and earlier. FINDINGS: Portable AP upright view at 1227 hours. 1 or 2 right lower lung chest tubes are in place. Probable postoperative changes to the nearby right lower lateral ribs. No pneumothorax. No pulmonary edema. No residual pleural effusion is evident. Interval improved right perihilar ventilation, but platelike left perihilar atelectasis has developed. Otherwise mild streaky bibasilar opacity. Mediastinal contours remain within normal limits. Negative visible bowel gas pattern. IMPRESSION: 1. Postoperative changes to the right lower chest with 1 versus 2 chest tubes in place. No pneumothorax or residual pleural effusion. 2. Mild bibasilar and left perihilar opacity, probably atelectasis. Electronically Signed   By: Genevie Ann M.D.   On: 06/25/2017 12:45   US Thoracentesis Asp Pleural Space W/img Guide  Result Date: 06/23/2017 INDICATION: RECURRENT MALIGNANT RIGHT PLEURAL EFFUSION EXAM: ULTRASOUND GUIDED RIGHT THORACENTESIS MEDICATIONS: 1% LIDOCAINE LOCALLY COMPLICATIONS: None immediate. PROCEDURE: An ultrasound guided thoracentesis was thoroughly discussed with the patient and questions answered. The  benefits, risks, alternatives and complications were also discussed. The patient understands and wishes to proceed with the procedure. Written consent was obtained. Ultrasound was performed to localize and mark an adequate pocket of fluid in the right chest. The area was then prepped and draped in the normal sterile fashion. 1% Lidocaine was used for local anesthesia. Under ultrasound guidance a 6 Fr Safe-T-Centesis catheter was introduced. Thoracentesis was performed. The catheter was removed and a dressing applied. FINDINGS: A total of approximately 2.5 L of BLOOD-TINGED PLEURAL fluid was removed. Samples were sent to the laboratory as requested by the clinical team. IMPRESSION: Successful ultrasound guided right thoracentesis yielding 2.5 L of pleural fluid. Electronically Signed   By: Jerilynn Mages.  Maryjane Benedict M.D.   On: 06/23/2017 16:04   US Thoracentesis Asp Pleural Space W/img Guide  Result Date: 06/18/2017 INDICATION: 71 year old male with a history of recurrent right-sided pleural effusion EXAM: ULTRASOUND GUIDED RIGHT THORACENTESIS MEDICATIONS: None. COMPLICATIONS: None PROCEDURE: An ultrasound guided thoracentesis was thoroughly discussed with the patient and questions answered. The benefits, risks, alternatives and complications were also discussed. The patient understands  and wishes to proceed with the procedure. Written consent was obtained. Ultrasound was performed to localize and mark an adequate pocket of fluid in the right chest. The area was then prepped and draped in the normal sterile fashion. 1% Lidocaine was used for local anesthesia. Under ultrasound guidance a 8 Fr Safe-T-Centesis catheter was introduced. Thoracentesis was performed. The catheter was removed and a dressing applied. FINDINGS: A total of approximately 2,200 CC of amber fluid was removed. Samples were sent to the laboratory as requested by the clinical team. IMPRESSION: Status post ultrasound-guided right thoracentesis. Signed, Dulcy Fanny.  Earleen Newport, DO [facility] Electronically Signed   By: Corrie Mckusick D.O.   On: 06/18/2017 12:24    Labs:  CBC:  Recent Labs  04/23/17 0433 06/23/17 1138 07/08/17 1236 07/16/17 0950  WBC 6.3 8.3 7.7 4.5  HGB 13.9 14.7 12.9* 12.4*  HCT 39.9* 42.8 38.5* 36.0*  PLT 217 326 372 285    COAGS:  Recent Labs  04/22/17 1709 04/23/17 0433 06/23/17 1138 07/16/17 0950  INR 0.93 1.04 1.05 1.00  APTT 25  --  29 27    BMP:  Recent Labs  04/22/17 1709 04/23/17 0433 06/18/17 1209 06/23/17 1138 07/08/17 1236  NA 138 140  --  137 137  K 4.2 4.0  --  4.6 4.6  CL 105 108  --  103 102  CO2 24 26  --  24 27  GLUCOSE 131* 100*  --  129* 125*  BUN 27* 24*  --  21* 26*  CALCIUM 9.7 9.1  --  9.3 9.0  CREATININE 1.48* 1.30* 1.40* 1.32* 1.34*  GFRNONAA 46* 53*  --  52* 51*  GFRAA 53* >60  --  >60 59*    LIVER FUNCTION TESTS:  Recent Labs  06/23/17 1138 07/08/17 1236  BILITOT 0.4 0.4  AST 18 20  ALT 18 25  ALKPHOS 80 92  PROT 7.2 7.1  ALBUMIN 3.7 3.6    TUMOR MARKERS: No results for input(s): AFPTM, CEA, CA199, CHROMGRNA in the last 8760 hours.  Assessment and Plan:  Plan for CT right posterior retroperitoneal mass biopsy today.  Risks and benefits discussed with the patient including, but not limited to bleeding, infection, damage to adjacent structures or low yield requiring additional tests. All of the patient's questions were answered, patient is agreeable to proceed. Consent signed and in chart.    Thank you for this interesting consult.  I greatly enjoyed meeting [place] and look forward to participating in their care.  A copy of this report was sent to the requesting provider on this date.  Electronically Signed: Greggory Keen, MD 07/16/2017, 10:34 AM   I spent a total of  30 Minutes   in face to face in clinical consultation, greater than 50% of which was counseling/coordinating care for  this patient with a right which peritoneal mass.

## 2017-07-16 NOTE — Procedures (Signed)
Right posterior pleural/retroperitoneal mass  Status post CT core biopsy  No immediate complication   EBL 5 mL  Pathology pending  Full report in PACs

## 2017-07-17 ENCOUNTER — Telehealth: Payer: Self-pay | Admitting: General Practice

## 2017-07-17 NOTE — Telephone Encounter (Addendum)
Sherry with [employer] called asking if we could change the patients orders that Dr. Genevive Bi had given the patient, she stated the patient said he is refusing wanting his dressing changed every other day, Judeen Hammans said she changed the patients dressing today and was going back on Saturday to change again patient asked for her to come again on Monday. Spoke with [employer] in office she is not changing the orders that Dr. Genevive Bi gave to him.

## 2017-07-18 ENCOUNTER — Ambulatory Visit: Payer: Medicare Other

## 2017-07-18 NOTE — Telephone Encounter (Signed)
Patient called in this morning. He states that nurse came out to home yesterday and did not have proper dressing supplies with her to change Pleurx dressing. So, "She made do with what she had." Patient is upset at how this looks and is requesting to come in to have this changed correctly.  Patient placed on schedule for nurse visit this morning.

## 2017-07-22 ENCOUNTER — Telehealth: Payer: Self-pay

## 2017-07-22 NOTE — Telephone Encounter (Signed)
Spoke with Ronnie Doss in Pathology at [facility] at this time. He states that patient's pathology was assigned to Dr. Dicie Beam and that this was sent out to [facility] for consultation. Ronnie Doss made a call to [facility] and was told that their is no diagnosis at this time and no estimate of when this result will be available. Belva Bertin number to call and check on the status of this is [phone]. I will call back later this week to check on status.

## 2017-07-23 ENCOUNTER — Telehealth: Payer: Self-pay | Admitting: Internal Medicine

## 2017-07-23 ENCOUNTER — Inpatient Hospital Stay: Payer: Medicare Other | Admitting: Internal Medicine

## 2017-07-23 NOTE — Telephone Encounter (Signed)
Discussed with Dr. Liliane Bade; still awaiting on path from [facility]. Spoke to [employer]- regarding upcoming appointment this afternoon. If patient is interested- he'll keep appointment; or follow-up after the pathology results are available.

## 2017-07-23 NOTE — Telephone Encounter (Signed)
Spoke with patient and he would like to cancel his appt today and reschedule his appt for next week. Message sent to scheduling to reschedule appt and notify the patient.

## 2017-07-23 NOTE — Progress Notes (Deleted)
[place] NOTE  Patient Care Team: Derinda Late, MD as PCP - General (Family Medicine) Margaretha Sheffield, MD (Otolaryngology) Telford Nab, RN as Registered Nurse  CHIEF COMPLAINTS/PURPOSE OF CONSULTATION: Malignant pleural effusion.   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     # Recurrent right sided pleural effusion x4 cytology-NEG; aug 1st talc pleurodesis/pleurex cath  # Hx of CVA Right hand; no residual deficits [left sided carotid block; no surgery done]- on Asa/plavix; CKD- stage III; DM-2; ? PN     Carcinoma of unknown primary ([place])     HISTORY OF PRESENTING ILLNESS:  Ian Shaffer 71 y.o.  male with a history of CAD CKD-III and a history of recurrent right-sided pleural effusion- has been referred to [place] for further evaluation and recommendations for malignancy.  Patient has noted worsening shortness of breath or cough or the last 3 months also. Patient was also evaluated by pulmonary Dr. Raul Del.Patient had  thoracentesis4 in the last 3 months; with negative cytologies. CT scan done in July 2018 showed- a large mass posterior aspect of the chest extending through the diaphragm into the retroperitoneal space.  Eventually patient underwent, thoracoscopic biopsy of the posterior right chest wall lesion- and also had talc pleurodesis/with Pleurx catheter placement.  Patient's shortness of breath is improved. He is having minimal drainage from the Pleurx catheter. He denies any pain.  Patient has chronic mild tingling and numbness in his lower extremities; which he attributes to history of diabetes. He states to have lost about 10 pounds in the last few months. Otherwise denies any nausea vomiting headaches. His fairly active for his age.  ROS: A  complete 10 point review of system is done which is negative except mentioned above in history of present illness  MEDICAL HISTORY:  Past Medical History:  Diagnosis Date  . Benign essential hypertension 05/31/2014  . Cerebral infarction ([place]) 05/31/2014  . Cerebrovascular disease 05/31/2014  . CKD (chronic kidney disease) stage 3, GFR 30-59 ml/min 02/23/2016  . CVA (cerebral infarction)   . Diabetes mellitus without complication ([place])   . Difficult intubation   . DVT (deep venous thrombosis) ([place])   . Esophageal reflux 05/31/2014  . H/O: CVA (cerebrovascular accident)   . Hyperlipidemia   . Hypertension   . ICAO (internal carotid artery occlusion) March 01, 2014   Left  . 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 Pleural effusion 04/23/2017  . Stroke [facility]) April, 4,2015  . Type 2 diabetes mellitus with peripheral neuropathy ([place]) 02/23/2016  . Weakness     SURGICAL HISTORY: Past Surgical History:  Procedure Laterality Date  . CHEST TUBE INSERTION N/A 06/25/2017   Procedure: [id number] CATHETER INSERTION;  Surgeon: Nestor Lewandowsky, MD;  Location: [facility] ORS;  Service: Thoracic;  Laterality: N/A;  . NASAL SINUS SURGERY  2000  . TONSILLECTOMY    . VIDEO ASSISTED THORACOSCOPY Right 06/25/2017   Procedure: VIDEO ASSISTED THORACOSCOPY WITH TALC PLEURODESIS, CHEST TUBE INSERTION;  Surgeon: Nestor Lewandowsky, MD;  Location: [facility] ORS;  Service: Thoracic;  Laterality: Right;    SOCIAL HISTORY: no smoke/ no alcohol ; lives in [place]; retd 9 years ago; retd [employer]-- dad had furnace- ? 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 No kids..  Social History   Social History  . Marital  status: Married    Spouse name: N/A  . Number of children: N/A  . Years of education: N/A   Occupational History  . Not on file.   Social History Main Topics  . Smoking status: Former Smoker    Types: Pipe    Quit date: 05/07/1974  . Smokeless tobacco: Never Used      Comment: pt states he smoked a pipe a long time ago  . Alcohol use No  . Drug use: No  . Sexual activity: Not on file   Other Topics Concern  . Not on file   Social History Narrative  . No narrative on file    FAMILY HISTORY: Family History  Problem Relation Age of Onset  . Hypertension Mother   . Hypertension Father     ALLERGIES:  is allergic to sulfa antibiotics; adhesive [tape]; and amoxicillin.  MEDICATIONS:  Current Outpatient Prescriptions  Medication Sig Dispense Refill  . amLODipine (NORVASC) 5 MG tablet amlodipine 5 mg tablet in am.    . aspirin 81 MG tablet Take 81 mg by mouth daily. In am.    . clopidogrel (PLAVIX) 75 MG tablet clopidogrel 75 mg tablet in am.    . diphenhydrAMINE (BENADRYL) 25 MG tablet Take 50 mg by mouth every 6 (six) hours as needed for itching.    . fluticasone (FLONASE) 50 MCG/ACT nasal spray Place 1 spray into both nostrils daily.    . Liraglutide (VICTOZA [place]) Inject 1.2 mg into the skin daily. In am.    . lisinopril (PRINIVIL,ZESTRIL) 10 MG tablet TAKE 1 TABLET BY MOUTH DAILY in am.    . metoprolol succinate (TOPROL-XL) 25 MG 24 hr tablet TAKE 1 TABLET BY MOUTH DAILY    . Multiple Vitamins-Minerals (CENTRUM SILVER PO) Take 1 tablet by mouth daily.    Marland Kitchen oxyCODONE-acetaminophen (PERCOCET/ROXICET) 5-325 MG tablet Take 1 tablet by mouth every 6 (six) hours as needed.    . pantoprazole (PROTONIX) 40 MG tablet TAKE 1 TABLET BY MOUTH DAILY in am.    . polyethylene glycol (MIRALAX / GLYCOLAX) packet Take 17 g by mouth daily.    . pravastatin (PRAVACHOL) 40 MG tablet TAKE 1 TABLET BY MOUTH DAILY in am.    . ranitidine (ZANTAC) 150 MG capsule Take 150 mg by mouth every evening.     No current facility-administered medications for this visit.       Marland Kitchen  PHYSICAL EXAMINATION: ECOG PERFORMANCE STATUS: 0 - Asymptomatic  There were no vitals filed for this visit. There were no vitals filed for this visit.  GENERAL: Well-nourished well-developed;  Alert, no distress and comfortable.   Accompanied by his wife. EYES: no pallor or icterus OROPHARYNX: no thrush or ulceration; good dentition  NECK: supple, no masses felt LYMPH:  no palpable lymphadenopathy in the cervical, axillary or inguinal regions LUNGS: Decreased breath sounds on the right side compared to the left. No wheeze or crackles HEART/CVS: regular rate & rhythm and no murmurs; No lower extremity edema ABDOMEN: abdomen soft, non-tender and normal bowel sounds Musculoskeletal:no cyanosis of digits and no clubbing  PSYCH: alert & oriented x 3 with fluent speech NEURO: no focal motor/sensory deficits SKIN:  no rashes or significant lesions  LABORATORY DATA:  I have reviewed the data as listed Lab Results  Component Value Date   WBC 4.5 07/16/2017   HGB 12.4 (L) 07/16/2017   HCT 36.0 (L) 07/16/2017   MCV 86.0 07/16/2017   PLT 285 07/16/2017    Recent Labs  04/23/17 0433 06/18/17 1209 06/23/17 1138 07/08/17 1236  NA 140  --  137 137  K 4.0  --  4.6 4.6  CL 108  --  103 102  CO2 26  --  24 27  GLUCOSE 100*  --  129* 125*  BUN 24*  --  21* 26*  CREATININE 1.30* 1.40* 1.32* 1.34*  CALCIUM 9.1  --  9.3 9.0  GFRNONAA 53*  --  52* 51*  GFRAA >60  --  >60 59*  PROT  --   --  7.2 7.1  ALBUMIN  --   --  3.7 3.6  AST  --   --  18 20  ALT  --   --  18 25  ALKPHOS  --   --  80 92  BILITOT  --   --  0.4 0.4    RADIOGRAPHIC STUDIES: I have personally reviewed the radiological images as listed and agreed with the findings in the report. Dg Chest 1 View  Result Date: 06/23/2017 CLINICAL DATA:  Status post right thoracentesis. EXAM: CHEST 1 VIEW COMPARISON:  Earlier today. FINDINGS: Small right pleural effusion with a significant decrease in size. No pneumothorax. Linear atelectasis or scarring in the right mid lung zone, without significant change. Clear left lung. Minimal central peribronchial thickening. Mild thoracic and upper lumbar spine degenerative changes.  IMPRESSION: 1. No pneumothorax following right thoracentesis. 2. Minimal residual right pleural fluid. 3. Stable linear atelectasis or scarring in the right mid lung zone with resolved right basilar atelectasis. Electronically Signed   By: Claudie Revering M.D.   On: 06/23/2017 21:52   Dg Chest 2 View  Result Date: 07/04/2017 CLINICAL DATA:  Follow-up RIGHT pleural effusion, catheter EXAM: CHEST  2 VIEW COMPARISON:  06/25/2017 FINDINGS: PleurX catheter at inferior RIGHT hemithorax laterally. RIGHT pleural effusion and atelectasis again identified at base slightly increased since previous study. Stable heart size and mediastinal contours. Bronchitic changes without infiltrate or pneumothorax. Probable LEFT nipple shadow, identified on lateral view as well. No acute osseous findings. IMPRESSION: Increased RIGHT pleural effusion and basilar atelectasis since previous study. Electronically Signed   By: Lavonia Dana M.D.   On: 07/04/2017 13:36   Dg Chest 2 View  Result Date: 06/23/2017 CLINICAL DATA:  Right pleural effusion.  Shortness of breath. EXAM: CHEST  2 VIEW COMPARISON:  Chest radiograph June 12, 2017; chest CT June 18, 2017 FINDINGS: There is a fairly sizable pleural effusion on the right with atelectatic change in the right mid and lower lung zones. Atelectasis also noted in the anterior segment of the right upper lobe. Left lung is clear. Heart size and pulmonary vascularity are normal. No adenopathy. There is degenerative change in the thoracic spine. IMPRESSION: Fairly sizable pleural effusion on the right with areas of atelectatic change in the right middle and lower lobes. There is also focal atelectatic change in the anterior segment right upper lobe. Left lung clear. Cardiac silhouette within normal limits. Electronically Signed   By: Lowella Grip III M.D.   On: 06/23/2017 14:23   Nm Pet Image Initial (pi) Skull Base To Thigh  Result Date: 07/09/2017 CLINICAL DATA:  Initial treatment strategy  for right lung mass. EXAM: NUCLEAR MEDICINE PET SKULL BASE TO THIGH TECHNIQUE: 12.9 mCi F-18 FDG was injected intravenously. Full-ring PET imaging was performed from the skull base to thigh after the radiotracer. CT data was obtained and used for attenuation correction and anatomic localization. FASTING BLOOD GLUCOSE:  Value: 137 mg/dl COMPARISON:  Multiple exams, including 06/18/2017 FINDINGS: NECK No hypermetabolic lymph nodes in the neck. CHEST Right chest tube in place with small loculated right pneumothorax and a mass along the right hemidiaphragm extending up into the pleural space and also caudad into the upper abdomen tangential to the posterior margin of the right hepatic lobe. This lesion extends into the right retroperitoneum as noted on prior CT. The dominant hypermetabolic mass like portion of this lesion is primarily intra-abdominal and measures about 9.5 by 6.0 by 5.6 cm, with maximum SUV 47.5. A there are other foci of hypermetabolic activity along the right pleural space suspicious for pleural tumor although some of the low-grade activity may be from inflammation. Scattered paratracheal lymph nodes are present including a 0.9 cm node on image 68/4, but these are not hypermetabolic beyond background mediastinal activity. Trace right pleural gas. ABDOMEN/PELVIS As noted in the chest section above, there is a tumor along the right hemidiaphragm and pleural space extending along the posterior margin the right hepatic lobe and into the right retroperitoneum, highly hypermetabolic. This is tangential to the liver but not obviously arising from the liver. No hypermetabolic abdominal adenopathy. No other hypermetabolic tumor in the abdomen. SKELETON No focal hypermetabolic activity to suggest skeletal metastasis. IMPRESSION: 1. Pleural tumor along the right lung base appears to potentially extend through the right hemidiaphragm and into the right retroperitoneum, maximum SUV 47.5, compatible with  malignancy. There other foci of hypermetabolic activity along the loculated right pleural effusion, some of which could be reactive given the presence of the chest tube, but other foci pleural tumor are suspected. Trace amount of gas in the right pleural space along with the loculated pleural effusion. Possibilities might include mesothelioma or perhaps a peripheral right lower lobe lung cancer invading into the pleural space, hemidiaphragm, and retroperitoneum. 2. Upper normal sized paratracheal lymph nodes without hypermetabolic activity. 3. No other significant foci of hypermetabolic tumor identified. Electronically Signed   By: Van Clines M.D.   On: 07/09/2017 12:23   Ct Biopsy  Result Date: 07/16/2017 INDICATION: Right posterior pleural/retroperitoneal mass EXAM: CT-GUIDED BIOPSY RIGHT POSTERIOR PLEURAL/RETROPERITONEAL MASS MEDICATIONS: 1% lidocaine local ANESTHESIA/SEDATION: 2.0 mg IV Versed; 50 mcg IV Fentanyl Moderate Sedation Time:  21 minutes The patient was continuously monitored during the procedure by the interventional radiology nurse under my direct supervision. PROCEDURE: The procedure, risks, benefits, and alternatives were explained to the patient. Questions regarding the procedure were encouraged and answered. The patient understands and consents to the procedure. previous imaging reviewed. patient positioned right side down decubitus. noncontrast localization ct performed. the posterior right pleural/retroperitoneal mass was localized. overlying skin marked posteriorly in the right flank area. under sterile conditions and local anesthesia, a 17 gauge 6.8 cm access needle was advanced from a RIGHT posterior lower intercostal approach to the lesion. needle position confirmed within the lesion. several 18 gauge core biopsies performed at 2 separate locations within the mass. samples were placed in formalin. needle tract embolized with gel-foam. needle removed. postprocedure imaging  demonstrates a small amount of hemorrhage adjacent to the mass but no large hematoma. Patient tolerated the procedure well without complication. Vital sign monitoring by nursing staff during the procedure will continue as patient is in the special procedures unit for post procedure observation. FINDINGS: The images document guide needle placement within the right posterior pleural/retroperitoneal mass. Post biopsy images demonstrate trace amount of surrounding hemorrhage. COMPLICATIONS: None immediate. IMPRESSION: Successful CT-guided core biopsy of the right posterior pleural/retroperitoneal mass Electronically Signed   By:  M.  Shick M.D.   On: 07/16/2017 12:01   Dg Chest Port 1 View  Result Date: 06/25/2017 CLINICAL DATA:  71 year old male status post Right Side Video-assisted Fluoroscopy With Pleurodesis. Postop day Zero. EXAM: PORTABLE CHEST 1 VIEW COMPARISON:  06/23/2017 and earlier. FINDINGS: Portable AP upright view at 1227 hours. 1 or 2 right lower lung chest tubes are in place. Probable postoperative changes to the nearby right lower lateral ribs. No pneumothorax. No pulmonary edema. No residual pleural effusion is evident. Interval improved right perihilar ventilation, but platelike left perihilar atelectasis has developed. Otherwise mild streaky bibasilar opacity. Mediastinal contours remain within normal limits. Negative visible bowel gas pattern. IMPRESSION: 1. Postoperative changes to the right lower chest with 1 versus 2 chest tubes in place. No pneumothorax or residual pleural effusion. 2. Mild bibasilar and left perihilar opacity, probably atelectasis. Electronically Signed   By: Genevie Ann M.D.   On: 06/25/2017 12:45   US Thoracentesis Asp Pleural Space W/img Guide  Result Date: 06/23/2017 INDICATION: RECURRENT MALIGNANT RIGHT PLEURAL EFFUSION EXAM: ULTRASOUND GUIDED RIGHT THORACENTESIS MEDICATIONS: 1% LIDOCAINE LOCALLY COMPLICATIONS: None immediate. PROCEDURE: An ultrasound guided  thoracentesis was thoroughly discussed with the patient and questions answered. The benefits, risks, alternatives and complications were also discussed. The patient understands and wishes to proceed with the procedure. Written consent was obtained. Ultrasound was performed to localize and mark an adequate pocket of fluid in the right chest. The area was then prepped and draped in the normal sterile fashion. 1% Lidocaine was used for local anesthesia. Under ultrasound guidance a 6 Fr Safe-T-Centesis catheter was introduced. Thoracentesis was performed. The catheter was removed and a dressing applied. FINDINGS: A total of approximately 2.5 L of BLOOD-TINGED PLEURAL fluid was removed. Samples were sent to the laboratory as requested by the clinical team. IMPRESSION: Successful ultrasound guided right thoracentesis yielding 2.5 L of pleural fluid. Electronically Signed   By: Jerilynn Mages.  Shick M.D.   On: 06/23/2017 16:04    ASSESSMENT & PLAN:   No problem-specific Assessment & Plan notes found for this encounter.  All questions were answered. The patient knows to call the clinic with any problems, questions or concerns.    Cammie Sickle, MD 07/23/2017 7:44 AM

## 2017-07-23 NOTE — Telephone Encounter (Signed)
Ian Shaffer has spoke with patient regarding new appt details.

## 2017-07-23 NOTE — Assessment & Plan Note (Deleted)
#   Malignancy- unknown primary/histology- biopsy of the posterior mediastinal mass [approximately 10 cm mass- extending above and below the diaphragm on the right side]- positive for epithelioid/spindle cell component- differential includes carcinoma/mesothelioma/sarcoma. Discussed with Dr.Olney; pathology was performed. Given the necrotic/nonviable tissue- unable to process for testing. Clinically, i'm suspicious of mesothelioma.  # Given the diagnostic difficulty-I would recommend a PET scan for further evaluation. A PET scan might help [place] further delineate site of the biopsy; also help for staging purposes.  # Discussed the patient and wife that- patient will likely need chemotherapy; however- given the difficulty with tissue diagnosis [as discussed above]- further details cannot be discussed this time.  # Large right-sided pleural effusion- likely malignant; cytology 4 negative. Status post Pleurx catheter/pleurodesis. No significant output noted.  # Check labs today including LDH. Patient will be contacted after the PET scan; to discuss the next plan of care/repeat biopsy. We'll also review the imaging at the tumor conference. Also, we'll have lung nurse navigator- introduce to the patient.   # Thank you Dr.Oaks for allowing me to participate in the care of your pleasant patient. Please do not hesitate to contact me with questions or concerns in the interim.  # I reviewed the blood work- with the patient in detail; also reviewed the imaging independently [as summarized above]; and with the patient in detail.    Cc; Dr.Oak/Dr.Baboff/Dr.Fleming.

## 2017-07-24 ENCOUNTER — Telehealth: Payer: Self-pay | Admitting: Internal Medicine

## 2017-07-24 NOTE — Telephone Encounter (Signed)
FYI:- Spoke to pt re: awaiting pathology. Discussed with Dr. Liliane Bade.

## 2017-07-25 ENCOUNTER — Encounter: Payer: Self-pay | Admitting: Cardiothoracic Surgery

## 2017-07-25 ENCOUNTER — Ambulatory Visit (INDEPENDENT_AMBULATORY_CARE_PROVIDER_SITE_OTHER): Payer: Medicare Other | Admitting: Cardiothoracic Surgery

## 2017-07-25 ENCOUNTER — Telehealth: Payer: Self-pay | Admitting: Internal Medicine

## 2017-07-25 VITALS — BP 147/75 | HR 74 | Temp 98.4°F | Resp 20 | Ht 70.0 in | Wt 200.4 lb

## 2017-07-25 DIAGNOSIS — J910 Malignant pleural effusion: Secondary | ICD-10-CM

## 2017-07-25 NOTE — Patient Instructions (Signed)
We will call and get the Pleurx Removal scheduled as soon as we talk to Dr. Rogue Bussing once your pathology has resulted.   Continue to change your dressings every other day until this is removed.  Call with any questions or concerns.

## 2017-07-25 NOTE — Telephone Encounter (Signed)
Saw patient in the office today. Call made to [facility]. Pathology results are still unavailable and their is still no expected timeline on receiving them. They are in the process of doing special staining. Will follow-up on this next week.

## 2017-07-25 NOTE — Telephone Encounter (Signed)
Spoke to Dr.Onley; awaiting path ? Sep 4th from [place]. Ordered foundation One today.   Please have pt see me on Thursday sep 6th on 1:30 pm.

## 2017-07-25 NOTE — Progress Notes (Signed)
  Patient ID: Ian Shaffer, male   DOB: November 21, 1946, 71 y.o.   MRN: [id number]  HISTORY: Mr. Teixeira returns today in follow-up. He did undergo a CT-guided needle biopsy. Those results are still not back yet and are currently being evaluated. The materials been sent to [facility] but there are still no results. He has not tried to drain his Pleurx and see was last seen here. Overall though he states that he feels pretty well.   Vitals:   07/25/17 1021  BP: (!) 147/75  Pulse: 74  Resp: 20  Temp: 98.4 F (36.9 C)  SpO2: 98%     EXAM:    Resp: Lungs are clear On the left and slightly diminished on the right throughout..  No respiratory distress, normal effort. Heart:  Regular without murmurs Abd:  Abdomen is soft, non distended and non tender. No masses are palpable.  There is no rebound and no guarding.  Neurological: Alert and oriented to person, place, and time. Coordination normal.  Skin: Skin is warm and dry. No rash noted. No diaphoretic. No erythema. No pallor.  Psychiatric: Normal mood and affect. Normal behavior. Judgment and thought content normal.   The right flank area is free of any erythema or bruising. The Pleurx catheter site looks good. His thoracoscopy site looks good. We did attempt to drain the fluid today but could not get anything out.    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 It may be that there is no fluid to be withdrawn or that the catheter itself was occluded. He is waiting to talk to Dr. Rogue Bussing about potential therapies. If the patient requires a Port-A-Cath I would be happy to perform that in and remove the Pleurx catheter at same time. He will contact us next week after he meets with Dr. Rogue Bussing.    Nestor Lewandowsky, MD

## 2017-07-29 NOTE — Telephone Encounter (Signed)
Foundation forms signed by md and faxed.

## 2017-07-29 NOTE — Telephone Encounter (Signed)
Message has been sent to scheduling team.  

## 2017-07-30 ENCOUNTER — Inpatient Hospital Stay: Payer: Medicare Other | Admitting: Internal Medicine

## 2017-07-31 ENCOUNTER — Telehealth: Payer: Self-pay

## 2017-07-31 ENCOUNTER — Inpatient Hospital Stay: Payer: Medicare Other | Attending: Internal Medicine | Admitting: Internal Medicine

## 2017-07-31 ENCOUNTER — Encounter: Payer: Self-pay | Admitting: *Deleted

## 2017-07-31 VITALS — BP 157/75 | HR 82 | Temp 98.1°F | Resp 16 | Wt 202.0 lb

## 2017-07-31 DIAGNOSIS — E860 Dehydration: Secondary | ICD-10-CM | POA: Diagnosis not present

## 2017-07-31 DIAGNOSIS — I129 Hypertensive chronic kidney disease with stage 1 through stage 4 chronic kidney disease, or unspecified chronic kidney disease: Secondary | ICD-10-CM | POA: Diagnosis not present

## 2017-07-31 DIAGNOSIS — D701 Agranulocytosis secondary to cancer chemotherapy: Secondary | ICD-10-CM | POA: Insufficient documentation

## 2017-07-31 DIAGNOSIS — E1122 Type 2 diabetes mellitus with diabetic chronic kidney disease: Secondary | ICD-10-CM | POA: Diagnosis not present

## 2017-07-31 DIAGNOSIS — E785 Hyperlipidemia, unspecified: Secondary | ICD-10-CM | POA: Insufficient documentation

## 2017-07-31 DIAGNOSIS — K219 Gastro-esophageal reflux disease without esophagitis: Secondary | ICD-10-CM | POA: Insufficient documentation

## 2017-07-31 DIAGNOSIS — J329 Chronic sinusitis, unspecified: Secondary | ICD-10-CM | POA: Insufficient documentation

## 2017-07-31 DIAGNOSIS — J9811 Atelectasis: Secondary | ICD-10-CM | POA: Insufficient documentation

## 2017-07-31 DIAGNOSIS — Z79899 Other long term (current) drug therapy: Secondary | ICD-10-CM | POA: Diagnosis not present

## 2017-07-31 DIAGNOSIS — T451X5S Adverse effect of antineoplastic and immunosuppressive drugs, sequela: Secondary | ICD-10-CM | POA: Insufficient documentation

## 2017-07-31 DIAGNOSIS — I679 Cerebrovascular disease, unspecified: Secondary | ICD-10-CM | POA: Diagnosis not present

## 2017-07-31 DIAGNOSIS — E871 Hypo-osmolality and hyponatremia: Secondary | ICD-10-CM | POA: Insufficient documentation

## 2017-07-31 DIAGNOSIS — J9859 Other diseases of mediastinum, not elsewhere classified: Secondary | ICD-10-CM | POA: Insufficient documentation

## 2017-07-31 DIAGNOSIS — N183 Chronic kidney disease, stage 3 (moderate): Secondary | ICD-10-CM | POA: Insufficient documentation

## 2017-07-31 DIAGNOSIS — R21 Rash and other nonspecific skin eruption: Secondary | ICD-10-CM | POA: Insufficient documentation

## 2017-07-31 DIAGNOSIS — Z8673 Personal history of transient ischemic attack (TIA), and cerebral infarction without residual deficits: Secondary | ICD-10-CM | POA: Insufficient documentation

## 2017-07-31 DIAGNOSIS — J90 Pleural effusion, not elsewhere classified: Secondary | ICD-10-CM

## 2017-07-31 DIAGNOSIS — C801 Malignant (primary) neoplasm, unspecified: Secondary | ICD-10-CM

## 2017-07-31 DIAGNOSIS — Z87891 Personal history of nicotine dependence: Secondary | ICD-10-CM | POA: Diagnosis not present

## 2017-07-31 DIAGNOSIS — Z7982 Long term (current) use of aspirin: Secondary | ICD-10-CM | POA: Insufficient documentation

## 2017-07-31 LAB — SURGICAL PATHOLOGY

## 2017-07-31 MED ORDER — DEXAMETHASONE 4 MG PO TABS: ORAL_TABLET | ORAL | 0 refills | Status: DC

## 2017-07-31 MED ORDER — ONDANSETRON HCL 8 MG PO TABS: 8.0000 mg | ORAL_TABLET | Freq: Three times a day (TID) | ORAL | 1 refills | Status: DC | PRN

## 2017-07-31 MED ORDER — FOLIC ACID 1 MG PO TABS: 1.0000 mg | ORAL_TABLET | Freq: Every day | ORAL | 1 refills | Status: DC

## 2017-07-31 MED ORDER — PROCHLORPERAZINE MALEATE 10 MG PO TABS: 10.0000 mg | ORAL_TABLET | Freq: Four times a day (QID) | ORAL | 1 refills | Status: DC | PRN

## 2017-07-31 NOTE — Progress Notes (Signed)
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

## 2017-07-31 NOTE — Telephone Encounter (Signed)
Call made at this time to [facility] Pathology Department. [phone]. No answer. Detailed message left requesting a phone call for status of this pathology sample and a timeline of when this will be resulted.

## 2017-07-31 NOTE — Patient Instructions (Signed)
Pemetrexed injection What is this medicine? PEMETREXED (PEM e TREX ed) is a chemotherapy drug used to treat lung cancers like non-small cell lung cancer and mesothelioma. It may also be used to treat other cancers. This medicine may be used for other purposes; ask your health care provider or pharmacist if you have questions. COMMON BRAND NAME(S): Alimta What should I tell my health care provider before I take this medicine? They need to know if you have any of these conditions: -infection (especially a virus infection such as chickenpox, cold sores, or herpes) -kidney disease -low blood counts, like low white cell, platelet, or red cell counts -lung or breathing disease, like asthma -radiation therapy -an unusual or allergic reaction to pemetrexed, other medicines, foods, dyes, or preservative -pregnant or trying to get pregnant -breast-feeding How should I use this medicine? This drug is given as an infusion into a vein. It is administered in a hospital or clinic by a specially trained health care professional. Talk to your pediatrician regarding the use of this medicine in children. Special care may be needed. Overdosage: If you think you have taken too much of this medicine contact a poison control center or emergency room at once. NOTE: This medicine is only for you. Do not share this medicine with others. What if I miss a dose? It is important not to miss your dose. Call your doctor or health care professional if you are unable to keep an appointment. What may interact with this medicine? This medicine may interact with the following medications: -Ibuprofen This list may not describe all possible interactions. Give your health care provider a list of all the medicines, herbs, non-prescription drugs, or dietary supplements you use. Also tell them if you smoke, drink alcohol, or use illegal drugs. Some items may interact with your medicine. What should I watch for while using this  medicine? Visit your doctor for checks on your progress. This drug may make you feel generally unwell. This is not uncommon, as chemotherapy can affect healthy cells as well as cancer cells. Report any side effects. Continue your course of treatment even though you feel ill unless your doctor tells you to stop. In some cases, you may be given additional medicines to help with side effects. Follow all directions for their use. Call your doctor or health care professional for advice if you get a fever, chills or sore throat, or other symptoms of a cold or flu. Do not treat yourself. This drug decreases your body's ability to fight infections. Try to avoid being around people who are sick. This medicine may increase your risk to bruise or bleed. Call your doctor or health care professional if you notice any unusual bleeding. Be careful brushing and flossing your teeth or using a toothpick because you may get an infection or bleed more easily. If you have any dental work done, tell your dentist you are receiving this medicine. Avoid taking products that contain aspirin, acetaminophen, ibuprofen, naproxen, or ketoprofen unless instructed by your doctor. These medicines may hide a fever. Call your doctor or health care professional if you get diarrhea or mouth sores. Do not treat yourself. To protect your kidneys, drink water or other fluids as directed while you are taking this medicine. Do not become pregnant while taking this medicine or for 6 months after stopping it. Women should inform their doctor if they wish to become pregnant or think they might be pregnant. Men should not father a child while taking this medicine and   for 3 months after stopping it. This may interfere with the ability to father a child. You should talk to your doctor or health care professional if you are concerned about your fertility. There is a potential for serious side effects to an unborn child. Talk to your health care  professional or pharmacist for more information. Do not breast-feed an infant while taking this medicine or for 1 week after stopping it. What side effects may I notice from receiving this medicine? Side effects that you should report to your doctor or health care professional as soon as possible: -allergic reactions like skin rash, itching or hives, swelling of the face, lips, or tongue -breathing problems -redness, blistering, peeling or loosening of the skin, including inside the mouth -signs and symptoms of bleeding such as bloody or black, tarry stools; red or dark-brown urine; spitting up blood or brown material that looks like coffee grounds; red spots on the skin; unusual bruising or bleeding from the eye, gums, or nose -signs and symptoms of infection like fever or chills; cough; sore throat; pain or trouble passing urine -signs and symptoms of kidney injury like trouble passing urine or change in the amount of urine -signs and symptoms of liver injury like dark yellow or brown urine; general ill feeling or flu-like symptoms; light-colored stools; loss of appetite; nausea; right upper belly pain; unusually weak or tired; yellowing of the eyes or skin Side effects that usually do not require medical attention (report to your doctor or health care professional if they continue or are bothersome): -constipation -dizziness -mouth sores -nausea, vomiting -pain, tingling, numbness in the hands or feet -unusually weak or tired This list may not describe all possible side effects. Call your doctor for medical advice about side effects. You may report side effects to FDA at 1-800-FDA-1088. Where should I keep my medicine? This drug is given in a hospital or clinic and will not be stored at home. NOTE: This sheet is a summary. It may not cover all possible information. If you have questions about this medicine, talk to your doctor, pharmacist, or health care provider.  2018 [employer]  Standard (2016-09-10 18:51:46) Carboplatin injection What is this medicine? CARBOPLATIN (KAR boe pla tin) is a chemotherapy drug. It targets fast dividing cells, like cancer cells, and causes these cells to die. This medicine is used to treat ovarian cancer and many other cancers. This medicine may be used for other purposes; ask your health care provider or pharmacist if you have questions. COMMON BRAND NAME(S): Paraplatin What should I tell my health care provider before I take this medicine? They need to know if you have any of these conditions: -blood disorders -hearing problems -kidney disease -recent or ongoing radiation therapy -an unusual or allergic reaction to carboplatin, cisplatin, other chemotherapy, other medicines, foods, dyes, or preservatives -pregnant or trying to get pregnant -breast-feeding How should I use this medicine? This drug is usually given as an infusion into a vein. It is administered in a hospital or clinic by a specially trained health care professional. Talk to your pediatrician regarding the use of this medicine in children. Special care may be needed. Overdosage: If you think you have taken too much of this medicine contact a poison control center or emergency room at once. NOTE: This medicine is only for you. Do not share this medicine with others. What if I miss a dose? It is important not to miss a dose. Call your doctor or health care professional if you are   unable to keep an appointment. What may interact with this medicine? -medicines for seizures -medicines to increase blood counts like filgrastim, pegfilgrastim, sargramostim -some antibiotics like amikacin, gentamicin, neomycin, streptomycin, tobramycin -vaccines Talk to your doctor or health care professional before taking any of these medicines: -acetaminophen -aspirin -ibuprofen -ketoprofen -naproxen This list may not describe all possible interactions. Give your health care provider a  list of all the medicines, herbs, non-prescription drugs, or dietary supplements you use. Also tell them if you smoke, drink alcohol, or use illegal drugs. Some items may interact with your medicine. What should I watch for while using this medicine? Your condition will be monitored carefully while you are receiving this medicine. You will need important blood work done while you are taking this medicine. This drug may make you feel generally unwell. This is not uncommon, as chemotherapy can affect healthy cells as well as cancer cells. Report any side effects. Continue your course of treatment even though you feel ill unless your doctor tells you to stop. In some cases, you may be given additional medicines to help with side effects. Follow all directions for their use. Call your doctor or health care professional for advice if you get a fever, chills or sore throat, or other symptoms of a cold or flu. Do not treat yourself. This drug decreases your body's ability to fight infections. Try to avoid being around people who are sick. This medicine may increase your risk to bruise or bleed. Call your doctor or health care professional if you notice any unusual bleeding. Be careful brushing and flossing your teeth or using a toothpick because you may get an infection or bleed more easily. If you have any dental work done, tell your dentist you are receiving this medicine. Avoid taking products that contain aspirin, acetaminophen, ibuprofen, naproxen, or ketoprofen unless instructed by your doctor. These medicines may hide a fever. Do not become pregnant while taking this medicine. Women should inform their doctor if they wish to become pregnant or think they might be pregnant. There is a potential for serious side effects to an unborn child. Talk to your health care professional or pharmacist for more information. Do not breast-feed an infant while taking this medicine. What side effects may I notice from  receiving this medicine? Side effects that you should report to your doctor or health care professional as soon as possible: -allergic reactions like skin rash, itching or hives, swelling of the face, lips, or tongue -signs of infection - fever or chills, cough, sore throat, pain or difficulty passing urine -signs of decreased platelets or bleeding - bruising, pinpoint red spots on the skin, black, tarry stools, nosebleeds -signs of decreased red blood cells - unusually weak or tired, fainting spells, lightheadedness -breathing problems -changes in hearing -changes in vision -chest pain -high blood pressure -low blood counts - This drug may decrease the number of white blood cells, red blood cells and platelets. You may be at increased risk for infections and bleeding. -nausea and vomiting -pain, swelling, redness or irritation at the injection site -pain, tingling, numbness in the hands or feet -problems with balance, talking, walking -trouble passing urine or change in the amount of urine Side effects that usually do not require medical attention (report to your doctor or health care professional if they continue or are bothersome): -hair loss -loss of appetite -metallic taste in the mouth or changes in taste This list may not describe all possible side effects. Call your doctor for medical   advice about side effects. You may report side effects to FDA at 1-800-FDA-1088. Where should I keep my medicine? This drug is given in a hospital or clinic and will not be stored at home. NOTE: This sheet is a summary. It may not cover all possible information. If you have questions about this medicine, talk to your doctor, pharmacist, or health care provider.  2018 [employer] Standard (2008-02-16 14:38:05)  

## 2017-07-31 NOTE — Progress Notes (Signed)
  Oncology Nurse Navigator Documentation  Navigator Location: [facility] (07/31/17 1400)   )Navigator Encounter Type: Follow-up Appt (07/31/17 1400)     Confirmed Diagnosis Date: 07/29/17 (07/31/17 1400)               Patient Visit Type: MedOnc (07/31/17 1400) Treatment Phase: Pre-Tx/Tx Discussion (07/31/17 1400) Barriers/Navigation Needs: Education;Coordination of Care (07/31/17 1400) Education: Understanding Cancer/ Treatment Options (07/31/17 1400) Interventions: Coordination of Care (07/31/17 1400)   Coordination of Care: Appts;Chemo (07/31/17 1400)       met with patient during follow up appt with Dr. Rogue Bussing to review results from biopsy. Report received from [facility] and reviewed with the patient by Dr. B. All questions answered at the time of appt. Assisted patient in getting scheduled for chemo class and first 2 cycles of chemo. Reviewed all upcoming appts with patient. Reviewed prescriptions that have been called into pharmacy and educated on when to take. Informed pt that will work on getting appt at [facility] for second opinion and he will be notified once appt has been scheduled. Contact info given to patient and his wife and informed to call with any further questions or needs. Pt verbalized understanding.            Time Spent with Patient: 60 (07/31/17 1400)

## 2017-07-31 NOTE — Progress Notes (Signed)
[facility] NOTE  Patient Care Team: Derinda Late, MD as PCP - General (Family Medicine) Margaretha Sheffield, MD (Otolaryngology) Telford Nab, RN as Registered Nurse Nestor Lewandowsky, MD as Referring Physician (Cardiothoracic Surgery)  CHIEF COMPLAINTS/PURPOSE OF CONSULTATION: Malignant pleural effusion.   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 PN     Carcinoma of unknown primary ([place])     HISTORY OF PRESENTING ILLNESS:  Havard Radigan Shaffer 71 y.o.  male recently diagnosed recurrent right-sided pleural effusion status post pleurodesis/ right posterior mediastinal mass is here to review PET scan/a repeat CT-guided biopsy.  Patient stated that his Pleurx catheter is not draining much now.   Patient's shortness of breath is improved. He denies any pain. He admits to mild early satiety. Weight is stable. Patient has chronic mild tingling and numbness in his lower extremities; which he attributes to history of diabetes. Otherwise denies any nausea vomiting headaches. His fairly active for his age.   ROS: A complete 10 point review of system is done which is negative except mentioned above in history of present illness  MEDICAL HISTORY:  Past Medical History:  Diagnosis Date  . Benign essential hypertension 05/31/2014  . Cerebral infarction ([place]) 05/31/2014  . Cerebrovascular disease 05/31/2014  . CKD (chronic kidney  disease) stage 3, GFR 30-59 ml/min 02/23/2016  . CVA (cerebral infarction)   . Diabetes mellitus without complication ([place])   . Difficult intubation   . DVT (deep venous thrombosis) ([place])   . Esophageal reflux 05/31/2014  . H/O: CVA (cerebrovascular accident)   . Hyperlipidemia   . Hypertension   . ICAO (internal carotid artery occlusion) March 01, 2014   Left  . 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 Pleural effusion 04/23/2017  . Stroke [facility]) April, 4,2015  . Type 2 diabetes mellitus with peripheral neuropathy ([place]) 02/23/2016  . Weakness     SURGICAL HISTORY: Past Surgical History:  Procedure Laterality Date  . CHEST TUBE INSERTION N/A 06/25/2017   Procedure: XFGHWE CATHETER INSERTION;  Surgeon: Nestor Lewandowsky, MD;  Location: [facility] ORS;  Service: Thoracic;  Laterality: N/A;  . NASAL SINUS SURGERY  2000  . TONSILLECTOMY    . VIDEO ASSISTED THORACOSCOPY Right 06/25/2017   Procedure: VIDEO ASSISTED THORACOSCOPY WITH TALC PLEURODESIS, CHEST TUBE INSERTION;  Surgeon: Nestor Lewandowsky, MD;  Location: [facility] ORS;  Service: Thoracic;  Laterality: Right;    SOCIAL HISTORY: no smoke/ no alcohol ; [place] 9 years ago; retd [employer]; but  dad had furnace- ? Few years in child hood;Exposure to agent orange in [place]. Has no children.  Social History   Social History  . Marital status: Married    Spouse name: N/A  . Number of children: N/A  . Years of education: N/A   Occupational History  . Not on file.   Social History Main Topics  . Smoking status: Former Smoker    Types: Pipe    Quit date: 05/07/1974  .  Smokeless tobacco: Never Used     Comment: pt states he smoked a pipe a long time ago  . Alcohol use No  . Drug use: No  . Sexual activity: Not on file   Other Topics Concern  . Not on file   Social History Narrative  . No narrative on file    FAMILY HISTORY: Family History  Problem Relation Age of Onset  .  Hypertension Mother   . Hypertension Father     ALLERGIES:  is allergic to sulfa antibiotics; adhesive [tape]; and amoxicillin.  MEDICATIONS:  Current Outpatient Prescriptions  Medication Sig Dispense Refill  . amLODipine (NORVASC) 5 MG tablet amlodipine 5 mg tablet in am.    . aspirin 81 MG tablet Take 81 mg by mouth daily. In am.    . clopidogrel (PLAVIX) 75 MG tablet clopidogrel 75 mg tablet in am.    . fluticasone (FLONASE) 50 MCG/ACT nasal spray Place 1 spray into both nostrils daily.    . Liraglutide (VICTOZA [place]) Inject 1.2 mg into the skin daily. In am.    . lisinopril (PRINIVIL,ZESTRIL) 10 MG tablet TAKE 1 TABLET BY MOUTH DAILY in am.    . metoprolol succinate (TOPROL-XL) 25 MG 24 hr tablet TAKE 1 TABLET BY MOUTH DAILY    . Multiple Vitamins-Minerals (CENTRUM SILVER PO) Take 1 tablet by mouth daily.    . pantoprazole (PROTONIX) 40 MG tablet TAKE 1 TABLET BY MOUTH DAILY in am.    . pravastatin (PRAVACHOL) 40 MG tablet TAKE 1 TABLET BY MOUTH DAILY in am.    . dexamethasone (DECADRON) 4 MG tablet Take one pill AM & PM x 3 days; start the day prior to chemo. 60 tablet 0  . folic acid (FOLVITE) 1 MG tablet Take 1 tablet (1 mg total) by mouth daily. 90 tablet 1  . ondansetron (ZOFRAN) 8 MG tablet Take 1 tablet (8 mg total) by mouth every 8 (eight) hours as needed for nausea or vomiting (start 3 days; after chemo). 40 tablet 1  . prochlorperazine (COMPAZINE) 10 MG tablet Take 1 tablet (10 mg total) by mouth every 6 (six) hours as needed for nausea or vomiting. 40 tablet 1   No current facility-administered medications for this visit.       Marland Kitchen  PHYSICAL EXAMINATION: ECOG PERFORMANCE STATUS: 0 - Asymptomatic  Vitals:   07/31/17 1332  BP: (!) 157/75  Pulse: 82  Resp: 16  Temp: 98.1 F (36.7 C)   Filed Weights   07/31/17 1332  Weight: 202 lb (91.6 kg)    GENERAL: Well-nourished well-developed; Alert, no distress and comfortable.   Accompanied by his wife. EYES: no pallor or  icterus OROPHARYNX: no thrush or ulceration; good dentition  NECK: supple, no masses felt LYMPH:  no palpable lymphadenopathy in the cervical, axillary or inguinal regions LUNGS: Decreased breath sounds on the right side compared to the left. No wheeze or crackles HEART/CVS: regular rate & rhythm and no murmurs; No lower extremity edema ABDOMEN: abdomen soft, non-tender and normal bowel sounds Musculoskeletal:no cyanosis of digits and no clubbing  PSYCH: alert & oriented x 3 with fluent speech NEURO: no focal motor/sensory deficits SKIN:  no rashes or significant lesions  LABORATORY DATA:  I have reviewed the data as listed Lab Results  Component Value Date   WBC 4.5 07/16/2017   HGB 12.4 (L) 07/16/2017   HCT 36.0 (L) 07/16/2017   MCV 86.0 07/16/2017   PLT 285 07/16/2017    Recent Labs  04/23/17  1884 06/18/17 1209 06/23/17 1138 07/08/17 1236  NA 140  --  137 137  K 4.0  --  4.6 4.6  CL 108  --  103 102  CO2 26  --  24 27  GLUCOSE 100*  --  129* 125*  BUN 24*  --  21* 26*  CREATININE 1.30* 1.40* 1.32* 1.34*  CALCIUM 9.1  --  9.3 9.0  GFRNONAA 53*  --  52* 51*  GFRAA >60  --  >60 59*  PROT  --   --  7.2 7.1  ALBUMIN  --   --  3.7 3.6  AST  --   --  18 20  ALT  --   --  18 25  ALKPHOS  --   --  80 92  BILITOT  --   --  0.4 0.4    RADIOGRAPHIC STUDIES: I have personally reviewed the radiological images as listed and agreed with the findings in the report. Dg Chest 2 View  Result Date: 07/04/2017 CLINICAL DATA:  Follow-up RIGHT pleural effusion, catheter EXAM: CHEST  2 VIEW COMPARISON:  06/25/2017 FINDINGS: PleurX catheter at inferior RIGHT hemithorax laterally. RIGHT pleural effusion and atelectasis again identified at base slightly increased since previous study. Stable heart size and mediastinal contours. Bronchitic changes without infiltrate or pneumothorax. Probable LEFT nipple shadow, identified on lateral view as well. No acute osseous findings. IMPRESSION:  Increased RIGHT pleural effusion and basilar atelectasis since previous study. Electronically Signed   By: Lavonia Dana M.D.   On: 07/04/2017 13:36   Nm Pet Image Initial (pi) Skull Base To Thigh  Result Date: 07/09/2017 CLINICAL DATA:  Initial treatment strategy for right lung mass. EXAM: NUCLEAR MEDICINE PET SKULL BASE TO THIGH TECHNIQUE: 12.9 mCi F-18 FDG was injected intravenously. Full-ring PET imaging was performed from the skull base to thigh after the radiotracer. CT data was obtained and used for attenuation correction and anatomic localization. FASTING BLOOD GLUCOSE:  Value: 137 mg/dl COMPARISON:  Multiple exams, including 06/18/2017 FINDINGS: NECK No hypermetabolic lymph nodes in the neck. CHEST Right chest tube in place with small loculated right pneumothorax and a mass along the right hemidiaphragm extending up into the pleural space and also caudad into the upper abdomen tangential to the posterior margin of the right hepatic lobe. This lesion extends into the right retroperitoneum as noted on prior CT. The dominant hypermetabolic mass like portion of this lesion is primarily intra-abdominal and measures about 9.5 by 6.0 by 5.6 cm, with maximum SUV 47.5. A there are other foci of hypermetabolic activity along the right pleural space suspicious for pleural tumor although some of the low-grade activity may be from inflammation. Scattered paratracheal lymph nodes are present including a 0.9 cm node on image 68/4, but these are not hypermetabolic beyond background mediastinal activity. Trace right pleural gas. ABDOMEN/PELVIS As noted in the chest section above, there is a tumor along the right hemidiaphragm and pleural space extending along the posterior margin the right hepatic lobe and into the right retroperitoneum, highly hypermetabolic. This is tangential to the liver but not obviously arising from the liver. No hypermetabolic abdominal adenopathy. No other hypermetabolic tumor in the abdomen.  SKELETON No focal hypermetabolic activity to suggest skeletal metastasis. IMPRESSION: 1. Pleural tumor along the right lung base appears to potentially extend through the right hemidiaphragm and into the right retroperitoneum, maximum SUV 47.5, compatible with malignancy. There other foci of hypermetabolic activity along the loculated right pleural effusion, some of which could be  reactive given the presence of the chest tube, but other foci pleural tumor are suspected. Trace amount of gas in the right pleural space along with the loculated pleural effusion. Possibilities might include mesothelioma or perhaps a peripheral right lower lobe lung cancer invading into the pleural space, hemidiaphragm, and retroperitoneum. 2. Upper normal sized paratracheal lymph nodes without hypermetabolic activity. 3. No other significant foci of hypermetabolic tumor identified. Electronically Signed   By: Van Clines M.D.   On: 07/09/2017 12:23   Ct Biopsy  Result Date: 07/16/2017 INDICATION: Right posterior pleural/retroperitoneal mass EXAM: CT-GUIDED BIOPSY RIGHT POSTERIOR PLEURAL/RETROPERITONEAL MASS MEDICATIONS: 1% lidocaine local ANESTHESIA/SEDATION: 2.0 mg IV Versed; 50 mcg IV Fentanyl Moderate Sedation Time:  21 minutes The patient was continuously monitored during the procedure by the interventional radiology nurse under my direct supervision. PROCEDURE: The procedure, risks, benefits, and alternatives were explained to the patient. Questions regarding the procedure were encouraged and answered. The patient understands and consents to the procedure. previous imaging reviewed. patient positioned right side down decubitus. noncontrast localization ct performed. the posterior right pleural/retroperitoneal mass was localized. overlying skin marked posteriorly in the right flank area. under sterile conditions and local anesthesia, a 17 gauge 6.8 cm access needle was advanced from a RIGHT posterior lower intercostal  approach to the lesion. needle position confirmed within the lesion. several 18 gauge core biopsies performed at 2 separate locations within the mass. samples were placed in formalin. needle tract embolized with gel-foam. needle removed. postprocedure imaging demonstrates a small amount of hemorrhage adjacent to the mass but no large hematoma. Patient tolerated the procedure well without complication. Vital sign monitoring by nursing staff during the procedure will continue as patient is in the special procedures unit for post procedure observation. FINDINGS: The images document guide needle placement within the right posterior pleural/retroperitoneal mass. Post biopsy images demonstrate trace amount of surrounding hemorrhage. COMPLICATIONS: None immediate. IMPRESSION: Successful CT-guided core biopsy of the right posterior pleural/retroperitoneal mass Electronically Signed   By: Jerilynn Mages.  Shick M.D.   On: 07/16/2017 12:01    ASSESSMENT & PLAN:   Carcinoma of unknown primary ([place]) # Malignancy- unknown primary posterior mediastinal mass [approximately 10 cm mass- extending above and below the diaphragm on the right side]- -repeat biopsy of the mass- reviewed at [facility]- pleomorphic malignancy- differential includes mesothelioma versus pleomorphic undifferentiated sarcoma/sarcomatoid mesothelioma. Foundation 1/PDL 1 testing pending.  # At a long discussion with patient and his wife regarding the difficult situation given the ambiguity of pathology/diagnosis. However given the possibility of mesothelioma on the differential- I would treat the patient with carboplatin and Alimta every 3 weeks.  Discussed the potential side effects including but not limited to-increasing fatigue, nausea vomiting, diarrhea, hair loss, sores in the mouth, increase risk of infection and also neuropathy. Discussed regarding B12/folic acid. Also discussed regarding dexamethasone premedication. Sent antiemetics/chemotherapy  education. .  # Large right-sided pleural effusion- likely malignant; cytology 4 negative. Status post Pleurx catheter/pleurodesis. No significant output noted. Awaiting discontinuation of the pleurex catheter.   # History of diabetes. Discussed the patient is likely experience elevated blood sugars- around the time of chemotherapy/given a steroid use. We'll reach out to PCP office.   # Patient was given a copy of pathology report. Also discussed regarding second opinion-interested in [place].   # Patient will start chemotherapy in 1 week; labs; follow-up labs in 10 days; follow-up with me second cycle of chemotherapy/labs.   Cc; Dr.Oak/Dr.Baboff/Dr.Fleming.   All questions were answered. The patient knows to  call the clinic with any problems, questions or concerns.    Cammie Sickle, MD 08/01/2017 6:47 PM

## 2017-07-31 NOTE — Assessment & Plan Note (Addendum)
#   Malignancy- unknown primary posterior mediastinal mass [approximately 10 cm mass- extending above and below the diaphragm on the right side]- -repeat biopsy of the mass- reviewed at [facility]- pleomorphic malignancy- differential includes mesothelioma versus pleomorphic undifferentiated sarcoma/sarcomatoid mesothelioma. Foundation 1/PDL 1 testing pending.  # At a long discussion with patient and his wife regarding the difficult situation given the ambiguity of pathology/diagnosis. However given the possibility of mesothelioma on the differential- I would treat the patient with carboplatin and Alimta every 3 weeks.  Discussed the potential side effects including but not limited to-increasing fatigue, nausea vomiting, diarrhea, hair loss, sores in the mouth, increase risk of infection and also neuropathy. Discussed regarding B12/folic acid. Also discussed regarding dexamethasone premedication. Sent antiemetics/chemotherapy education. .  # Large right-sided pleural effusion- likely malignant; cytology 4 negative. Status post Pleurx catheter/pleurodesis. No significant output noted. Awaiting discontinuation of the pleurex catheter.   # History of diabetes. Discussed the patient is likely experience elevated blood sugars- around the time of chemotherapy/given a steroid use. We'll reach out to PCP office.   # Patient was given a copy of pathology report. Also discussed regarding second opinion-interested in [place].   # Patient will start chemotherapy in 1 week; labs; follow-up labs in 10 days; follow-up with me second cycle of chemotherapy/labs.   Cc; Dr.Oak/Dr.Baboff/Dr.Fleming.

## 2017-08-01 ENCOUNTER — Telehealth: Payer: Self-pay | Admitting: Internal Medicine

## 2017-08-01 ENCOUNTER — Ambulatory Visit (INDEPENDENT_AMBULATORY_CARE_PROVIDER_SITE_OTHER): Payer: Medicare Other

## 2017-08-01 VITALS — Ht 70.0 in

## 2017-08-01 DIAGNOSIS — Z4800 Encounter for change or removal of nonsurgical wound dressing: Secondary | ICD-10-CM

## 2017-08-01 NOTE — Patient Instructions (Signed)
We will arrange to have your surgery to remove your pleurx next week with Dr. Genevive Bi. I will call your nurse in the [place] to make sure that your chemo will not impact when this surgery will be completed and then I will call you to let you know what has been arranged.

## 2017-08-01 NOTE — Telephone Encounter (Signed)
Reviewed with Dr.Stichcomb at [facility]. Kindly agrees to evaluate the patient. Discussed with the patient he agrees. Also discussed- would recommend reimaging after 2 cycles to check for response.  Hayley, please make a referral to Dr.Stinchcomb; also reach out to his nurse/office to ensure that referral to the timely manner. Also return to Dr. Loney Hering office re: issues with his blood sugars/steroid use around chemo. Question need for insulin sliding scale.

## 2017-08-01 NOTE — Progress Notes (Signed)
Patient was seen in office to clean insertion site and replace dressing around pleurx. Area was cleaned with chlorahexadine and normal saline. Dressing was changed with sterile fashion using the pleurx dressing kit provided in drainage kit. This was done without difficulty. No redness at skin and pleurx catheter was intact.  Time spent with patient: 5 minutes

## 2017-08-04 ENCOUNTER — Telehealth: Payer: Self-pay

## 2017-08-04 ENCOUNTER — Ambulatory Visit
Admission: RE | Admit: 2017-08-04 | Discharge: 2017-08-04 | Disposition: A | Discharge status: Home / Self Care | Payer: Medicare Other | Source: Ambulatory Visit | Attending: Cardiothoracic Surgery | Admitting: Cardiothoracic Surgery

## 2017-08-04 ENCOUNTER — Other Ambulatory Visit: Payer: Self-pay | Admitting: Cardiothoracic Surgery

## 2017-08-04 DIAGNOSIS — Z01818 Encounter for other preprocedural examination: Secondary | ICD-10-CM | POA: Insufficient documentation

## 2017-08-04 DIAGNOSIS — R918 Other nonspecific abnormal finding of lung field: Secondary | ICD-10-CM

## 2017-08-04 HISTORY — DX: Malignant (primary) neoplasm, unspecified: C80.1

## 2017-08-04 LAB — CBC WITH DIFFERENTIAL/PLATELET
Basophils Absolute: 0 10*3/uL (ref 0–0.1)
Basophils Relative: 1 %
Eosinophils Absolute: 0.1 10*3/uL (ref 0–0.7)
Eosinophils Relative: 2 %
HEMATOCRIT: 35.9 % — AB (ref 40.0–52.0)
HEMOGLOBIN: 12.2 g/dL — AB (ref 13.0–18.0)
LYMPHS ABS: 1.1 10*3/uL (ref 1.0–3.6)
Lymphocytes Relative: 18 %
MCH: 29.3 pg (ref 26.0–34.0)
MCHC: 33.9 g/dL (ref 32.0–36.0)
MCV: 86.3 fL (ref 80.0–100.0)
MONOS PCT: 8 %
Monocytes Absolute: 0.5 10*3/uL (ref 0.2–1.0)
NEUTROS ABS: 4.3 10*3/uL (ref 1.4–6.5)
NEUTROS PCT: 71 %
Platelets: 248 10*3/uL (ref 150–440)
RBC: 4.17 MIL/uL — ABNORMAL LOW (ref 4.40–5.90)
RDW: 12.9 % (ref 11.5–14.5)
WBC: 6 10*3/uL (ref 3.8–10.6)

## 2017-08-04 LAB — BASIC METABOLIC PANEL
ANION GAP: 9 (ref 5–15)
BUN: 25 mg/dL — ABNORMAL HIGH (ref 6–20)
CHLORIDE: 105 mmol/L (ref 101–111)
CO2: 24 mmol/L (ref 22–32)
Calcium: 9.2 mg/dL (ref 8.9–10.3)
Creatinine, Ser: 1.32 mg/dL — ABNORMAL HIGH (ref 0.61–1.24)
GFR calc non Af Amer: 52 mL/min — ABNORMAL LOW (ref >60)
Glucose, Bld: 187 mg/dL — ABNORMAL HIGH (ref 65–99)
Potassium: 3.9 mmol/L (ref 3.5–5.1)
Sodium: 138 mmol/L (ref 135–145)

## 2017-08-04 LAB — SURGICAL PCR SCREEN
MRSA, PCR: NEGATIVE
Staphylococcus aureus: NEGATIVE

## 2017-08-04 LAB — APTT: aPTT: 28 seconds (ref 24–36)

## 2017-08-04 LAB — PROTIME-INR
INR: 0.98
Prothrombin Time: 12.9 seconds (ref 11.4–15.2)

## 2017-08-04 NOTE — Telephone Encounter (Signed)
Patient scheduled for Pleurx Removal on 08/07/17 with Dr. Genevive Bi. Patient last took Plavix on 08/03/17 in am. Dr. Genevive Bi is ok with doing surgery on 08/07/17 with this information.   Patient to go to Pre-admission testing today at 1345.  Call made to patient. He has been made aware of all information. He verbalizes understanding.

## 2017-08-04 NOTE — Telephone Encounter (Signed)
Spoke with Cherlyn Cushing at [facility]. Cherlyn Cushing requested records be faxed to [phone] to get pt scheduled with Dr. Aniceto Boss. Records have been faxed and Dr. Vertell Limber office will notify pt with appt. Spoke with scheduling at Dr. Elzie Rings office and they will get pt scheduled to be seen today or sometime this week. They will contact the patient to coordinate appt.

## 2017-08-04 NOTE — Patient Instructions (Signed)
  Your procedure is scheduled [id number] Sept. 13, 2018. Report to Same Day Surgery. To find out your arrival time please call [phone] between 1PM - 3PM on Wednesday Sept 12, 2018.  Remember: Instructions that are not followed completely may result in serious medical risk, up to and including death, or upon the discretion of your surgeon and anesthesiologist your surgery may need to be rescheduled.    _x___ 1. Do not eat food after midnight. No gum chewing or hard candies. May drink clear liquids= water, apple   juice, black coffee or tea (nothing added) or Gatorade until 2 hours prior to your arrival.    ____ 2. No Alcohol for 24 hours before or after surgery.   ____ 3. Bring all medications with you on the day of surgery if instructed.    __x__ 4. Notify your doctor if there is any change in your medical condition     (cold, fever, infections).    _____ 5. No smoking 24 hours prior to surgery.     Do not wear jewelry, make-up, hairpins, clips or nail polish.  Do not wear lotions, powders, or perfumes.   Do not shave 48 hours prior to surgery. Men may shave face and neck.  Do not bring valuables to the hospital.    [facility] is not responsible for any belongings or valuables.               Contacts, dentures or bridgework may not be worn into surgery.  Leave your suitcase in the car. After surgery it may be brought to your room.  For patients admitted to the hospital, discharge time is determined by your treatment team.   Patients discharged the day of surgery will not be allowed to drive home.    Please read over the following fact sheets that you were given:   [facility] Preparing for Surgery  __x__ Take these medicines the morning of surgery with A SIP OF WATER:    1. amLODipine (NORVASC)   2. metoprolol succinate (TOPROL-XL)  3. pravastatin (PRAVACHOL)   ____ Fleet Enema (as directed)   __x__ Use SAGE wipes as directed on instruction sheet  ____ Use inhalers  on the day of surgery and bring to hospital day of surgery  ____ Stop metformin 2 days prior to surgery    ____ Take 1/2 of usual insulin dose the night before surgery and none on the morning of surgery.   _x___ Stopped Plavix & aspirin today for procedure on 08/07/17.  ____ Stop Anti-inflammatories such as Advil, Aleve, Ibuprofen, Motrin, Naproxen,  Naprosyn, Goodies powders or aspirin  products. OK to take Tylenol.   ____ Stop supplements until after surgery.    ____ Bring C-Pap to the hospital.

## 2017-08-05 ENCOUNTER — Inpatient Hospital Stay: Payer: Medicare Other

## 2017-08-05 ENCOUNTER — Inpatient Hospital Stay: Admission: RE | Admit: 2017-08-05 | Payer: Medicare Other | Source: Ambulatory Visit

## 2017-08-05 ENCOUNTER — Other Ambulatory Visit: Payer: Self-pay | Admitting: Internal Medicine

## 2017-08-05 DIAGNOSIS — J9859 Other diseases of mediastinum, not elsewhere classified: Secondary | ICD-10-CM | POA: Diagnosis not present

## 2017-08-05 DIAGNOSIS — J90 Pleural effusion, not elsewhere classified: Secondary | ICD-10-CM

## 2017-08-05 MED ORDER — CYANOCOBALAMIN 1000 MCG/ML IJ SOLN
1000.0000 ug | Freq: Once | INTRAMUSCULAR | Status: AC
  Administered 2017-08-05: 1000 ug via INTRAMUSCULAR
  Filled 2017-08-05: qty 1

## 2017-08-06 ENCOUNTER — Inpatient Hospital Stay: Payer: Medicare Other

## 2017-08-06 ENCOUNTER — Encounter: Payer: Self-pay | Admitting: *Deleted

## 2017-08-06 VITALS — BP 126/74 | HR 65 | Temp 96.9°F | Resp 18

## 2017-08-06 DIAGNOSIS — J90 Pleural effusion, not elsewhere classified: Secondary | ICD-10-CM

## 2017-08-06 DIAGNOSIS — J9859 Other diseases of mediastinum, not elsewhere classified: Secondary | ICD-10-CM | POA: Diagnosis not present

## 2017-08-06 MED ORDER — SODIUM CHLORIDE 0.9 % IV SOLN
1000.0000 mg | Freq: Once | INTRAVENOUS | Status: AC
  Administered 2017-08-06: 1000 mg via INTRAVENOUS
  Filled 2017-08-06: qty 40

## 2017-08-06 MED ORDER — DEXAMETHASONE SODIUM PHOSPHATE 10 MG/ML IJ SOLN
10.0000 mg | Freq: Once | INTRAMUSCULAR | Status: AC
  Administered 2017-08-06: 10 mg via INTRAVENOUS
  Filled 2017-08-06: qty 1

## 2017-08-06 MED ORDER — SODIUM CHLORIDE 0.9 % IV SOLN: 10.0000 mg | Freq: Once | INTRAVENOUS | Status: DC

## 2017-08-06 MED ORDER — SODIUM CHLORIDE 0.9 % IV SOLN
Freq: Once | INTRAVENOUS | Status: AC
  Administered 2017-08-06: 11:00:00 via INTRAVENOUS
  Filled 2017-08-06: qty 1000

## 2017-08-06 MED ORDER — CEFAZOLIN SODIUM-DEXTROSE 2-4 GM/100ML-% IV SOLN
2.0000 g | INTRAVENOUS | Status: AC
  Administered 2017-08-07: 2 g via INTRAVENOUS

## 2017-08-06 MED ORDER — SODIUM CHLORIDE 0.9 % IV SOLN
448.0000 mg | Freq: Once | INTRAVENOUS | Status: AC
  Administered 2017-08-06: 450 mg via INTRAVENOUS
  Filled 2017-08-06: qty 45

## 2017-08-06 MED ORDER — PALONOSETRON HCL INJECTION 0.25 MG/5ML
0.2500 mg | Freq: Once | INTRAVENOUS | Status: AC
  Administered 2017-08-06: 0.25 mg via INTRAVENOUS
  Filled 2017-08-06: qty 5

## 2017-08-06 MED ORDER — HEPARIN SOD (PORK) LOCK FLUSH 100 UNIT/ML IV SOLN: 500.0000 [IU] | Freq: Once | INTRAVENOUS | Status: DC | PRN

## 2017-08-06 NOTE — Progress Notes (Signed)
  Oncology Nurse Navigator Documentation  Navigator Location: [facility] (08/06/17 1000)   )Navigator Encounter Type: Treatment (08/06/17 1000)                   Treatment Initiated Date: 08/06/17 (08/06/17 1000)   Treatment Phase: First Chemo Tx (08/06/17 1000) Barriers/Navigation Needs: No barriers at this time (08/06/17 1000)   Interventions: None required (08/06/17 1000)         met with patient prior to first chemo. Pt questioned if could get flu vaccine at this time. Pt instructed to wait until cycle #2 before getting flu shot in order to not confuse possible side effects of chemo with side effects of flu vaccine. No further questions or needs. Instructed pt to call if needs anything else while under treatment. Pt verbalized understanding.             Time Spent with Patient: 15 (08/06/17 1000)

## 2017-08-07 ENCOUNTER — Encounter
Admission: RE | Disposition: A | Discharge status: Home / Self Care | Payer: Self-pay | Source: Ambulatory Visit | Attending: Cardiothoracic Surgery

## 2017-08-07 ENCOUNTER — Ambulatory Visit
Admission: RE | Admit: 2017-08-07 | Discharge: 2017-08-07 | Disposition: A | Discharge status: Home / Self Care | Payer: Medicare Other | Source: Ambulatory Visit | Attending: Cardiothoracic Surgery | Admitting: Cardiothoracic Surgery

## 2017-08-07 ENCOUNTER — Ambulatory Visit: Payer: Medicare Other | Admitting: Anesthesiology

## 2017-08-07 ENCOUNTER — Encounter: Payer: Self-pay | Admitting: *Deleted

## 2017-08-07 DIAGNOSIS — X58XXXA Exposure to other specified factors, initial encounter: Secondary | ICD-10-CM | POA: Diagnosis not present

## 2017-08-07 DIAGNOSIS — T859XXA Unspecified complication of internal prosthetic device, implant and graft, initial encounter: Secondary | ICD-10-CM | POA: Insufficient documentation

## 2017-08-07 DIAGNOSIS — R918 Other nonspecific abnormal finding of lung field: Secondary | ICD-10-CM | POA: Diagnosis not present

## 2017-08-07 DIAGNOSIS — I10 Essential (primary) hypertension: Secondary | ICD-10-CM | POA: Diagnosis not present

## 2017-08-07 DIAGNOSIS — C450 Mesothelioma of pleura: Secondary | ICD-10-CM | POA: Diagnosis not present

## 2017-08-07 DIAGNOSIS — Z87891 Personal history of nicotine dependence: Secondary | ICD-10-CM | POA: Diagnosis not present

## 2017-08-07 DIAGNOSIS — N289 Disorder of kidney and ureter, unspecified: Secondary | ICD-10-CM | POA: Diagnosis not present

## 2017-08-07 DIAGNOSIS — E118 Type 2 diabetes mellitus with unspecified complications: Secondary | ICD-10-CM | POA: Diagnosis not present

## 2017-08-07 DIAGNOSIS — C457 Mesothelioma of other sites: Secondary | ICD-10-CM | POA: Insufficient documentation

## 2017-08-07 DIAGNOSIS — I739 Peripheral vascular disease, unspecified: Secondary | ICD-10-CM | POA: Diagnosis not present

## 2017-08-07 HISTORY — PX: CHEST TUBE INSERTION: SHX231

## 2017-08-07 LAB — GLUCOSE, CAPILLARY
Glucose-Capillary: 127 mg/dL — ABNORMAL HIGH (ref 65–99)
Glucose-Capillary: 131 mg/dL — ABNORMAL HIGH (ref 65–99)

## 2017-08-07 SURGERY — CHEST TUBE INSERTION
Anesthesia: Monitor Anesthesia Care | Site: Flank | Laterality: Right | Wound class: Clean

## 2017-08-07 MED ORDER — MIDAZOLAM HCL 2 MG/2ML IJ SOLN
INTRAMUSCULAR | Status: AC
  Filled 2017-08-07: qty 2

## 2017-08-07 MED ORDER — FENTANYL CITRATE (PF) 100 MCG/2ML IJ SOLN
INTRAMUSCULAR | Status: DC | PRN
  Administered 2017-08-07 (×2): 25 ug via INTRAVENOUS
  Administered 2017-08-07: 50 ug via INTRAVENOUS

## 2017-08-07 MED ORDER — FENTANYL CITRATE (PF) 100 MCG/2ML IJ SOLN: 25.0000 ug | INTRAMUSCULAR | Status: DC | PRN

## 2017-08-07 MED ORDER — FAMOTIDINE 20 MG PO TABS
ORAL_TABLET | ORAL | Status: AC
  Administered 2017-08-07: 20 mg via ORAL
  Filled 2017-08-07: qty 1

## 2017-08-07 MED ORDER — CEFAZOLIN SODIUM-DEXTROSE 2-4 GM/100ML-% IV SOLN
INTRAVENOUS | Status: AC
  Filled 2017-08-07: qty 100

## 2017-08-07 MED ORDER — ONDANSETRON HCL 4 MG/2ML IJ SOLN: 4.0000 mg | Freq: Once | INTRAMUSCULAR | Status: DC | PRN

## 2017-08-07 MED ORDER — EPHEDRINE SULFATE 50 MG/ML IJ SOLN
INTRAMUSCULAR | Status: AC
  Filled 2017-08-07: qty 1

## 2017-08-07 MED ORDER — PROPOFOL 10 MG/ML IV BOLUS
INTRAVENOUS | Status: DC | PRN
  Administered 2017-08-07 (×2): 20 mg via INTRAVENOUS
  Administered 2017-08-07: 50 mg via INTRAVENOUS
  Administered 2017-08-07: 20 mg via INTRAVENOUS

## 2017-08-07 MED ORDER — FAMOTIDINE 20 MG PO TABS
20.0000 mg | ORAL_TABLET | Freq: Once | ORAL | Status: AC
  Administered 2017-08-07: 20 mg via ORAL

## 2017-08-07 MED ORDER — BUPIVACAINE HCL (PF) 0.5 % IJ SOLN
INTRAMUSCULAR | Status: DC | PRN
  Administered 2017-08-07: 10 mL

## 2017-08-07 MED ORDER — SODIUM CHLORIDE 0.9 % IV SOLN
INTRAVENOUS | Status: DC
  Administered 2017-08-07: 11:00:00 via INTRAVENOUS

## 2017-08-07 MED ORDER — EPHEDRINE SULFATE 50 MG/ML IJ SOLN
INTRAMUSCULAR | Status: DC | PRN
  Administered 2017-08-07 (×2): 10 mg via INTRAVENOUS

## 2017-08-07 MED ORDER — PROPOFOL 10 MG/ML IV BOLUS
INTRAVENOUS | Status: AC
  Filled 2017-08-07: qty 20

## 2017-08-07 MED ORDER — MIDAZOLAM HCL 2 MG/2ML IJ SOLN
INTRAMUSCULAR | Status: DC | PRN
  Administered 2017-08-07: 2 mg via INTRAVENOUS

## 2017-08-07 MED ORDER — BUPIVACAINE HCL (PF) 0.5 % IJ SOLN
INTRAMUSCULAR | Status: AC
  Filled 2017-08-07: qty 10

## 2017-08-07 MED ORDER — LIDOCAINE HCL (PF) 1 % IJ SOLN
INTRAMUSCULAR | Status: AC
  Filled 2017-08-07: qty 2

## 2017-08-07 MED ORDER — FENTANYL CITRATE (PF) 100 MCG/2ML IJ SOLN
INTRAMUSCULAR | Status: AC
  Filled 2017-08-07: qty 2

## 2017-08-07 SURGICAL SUPPLY — 26 items
BLADE SURG SZ11 CARB STEEL (BLADE) ×2 IMPLANT
CANISTER SUCT 1200ML W/VALVE (MISCELLANEOUS) ×2 IMPLANT
CHLORAPREP W/TINT 26ML (MISCELLANEOUS) ×2 IMPLANT
DRAPE LAPAROTOMY 77X122 PED (DRAPES) ×2 IMPLANT
DRSG OPSITE POSTOP 3X4 (GAUZE/BANDAGES/DRESSINGS) ×2 IMPLANT
ELECT REM PT RETURN 9FT ADLT (ELECTROSURGICAL) ×2
ELECTRODE REM PT RTRN 9FT ADLT (ELECTROSURGICAL) ×1 IMPLANT
GLOVE SURG SYN 7.5  E (GLOVE) ×1
GLOVE SURG SYN 7.5 E (GLOVE) ×1 IMPLANT
GOWN STRL REUS W/ TWL LRG LVL3 (GOWN DISPOSABLE) ×2 IMPLANT
GOWN STRL REUS W/TWL LRG LVL3 (GOWN DISPOSABLE) ×2
KIT RM TURNOVER STRD PROC AR (KITS) ×2 IMPLANT
LABEL OR SOLS (LABEL) ×2 IMPLANT
NEEDLE HYPO 22GX1.5 SAFETY (NEEDLE) ×2 IMPLANT
NS IRRIG 500ML POUR BTL (IV SOLUTION) ×2 IMPLANT
PACK BASIN MINOR ARMC (MISCELLANEOUS) ×2 IMPLANT
SUCTION FRAZIER HANDLE 10FR (MISCELLANEOUS) ×2
SUCTION TUBE FRAZIER 10FR DISP (MISCELLANEOUS) ×2 IMPLANT
SUT ETHILON 3-0 FS-10 30 BLK (SUTURE) ×2
SUT ETHILON 4-0 (SUTURE) ×1
SUT ETHILON 4-0 FS2 18XMFL BLK (SUTURE) ×1
SUT VIC AB 3-0 SH 27 (SUTURE) ×1
SUT VIC AB 3-0 SH 27X BRD (SUTURE) ×1 IMPLANT
SUTURE EHLN 3-0 FS-10 30 BLK (SUTURE) ×1 IMPLANT
SUTURE ETHLN 4-0 FS2 18XMF BLK (SUTURE) ×1 IMPLANT
SYR 10ML 18GX1 1/2 (NEEDLE) ×2 IMPLANT

## 2017-08-07 NOTE — Anesthesia Post-op Follow-up Note (Signed)
Anesthesia QCDR form completed.        

## 2017-08-07 NOTE — Op Note (Signed)
  08/07/2017  12:49 PM  PATIENT:  Foster Simpson Hakanson  71 y.o. male  PRE-OPERATIVE DIAGNOSIS:  Malignant mesothelioma   POST-OPERATIVE DIAGNOSIS:  same  PROCEDURE:  Removal of right-sided Pleurx catheter  SURGEON:  Surgeon(s) and Role:    Nestor Lewandowsky, MD - Primary  ASSISTANTS: none  ANESTHESIA: MAC  INDICATIONS FOR PROCEDURE this patient is a 71 year old gentleman who had a Pleurx catheter placed several weeks ago. The catheter has not been draining and it is now removed. The indications and risks were explained patient gave his informed consent.  Description of procedure. The patient was brought to the operating suite and placed in the supine position. The patient's right chest was slightly bumped up to allow access to the catheter. The patient was prepped and draped in the usual sterile fashion. Half percent Marcaine was used as a local anesthetic. The skin exit site was initially anesthetized and opened with a hemostat. A could not remove the catheter in this manner and therefore anesthetized the prior insertion site.An incision was made and the catheter was dissected from the subcutaneous tissues. It was removed from the chest cavity and then cut allowing complete removal of the catheter. Hemostasis was complete. Subcutaneous tissues were closed with 3-0 Vicryl and the skin with 4-0 nylon sutures. Sterile dressings were applied. The patient tolerated the procedure well and was taken to the recovery room in stable condition.

## 2017-08-07 NOTE — Anesthesia Preprocedure Evaluation (Signed)
Anesthesia Evaluation  Patient identified by MRN, date of birth, ID band Patient awake    Reviewed: Allergy & Precautions, H&P , NPO status , Patient's Chart, lab work & pertinent test results, reviewed documented beta blocker date and time   Airway Mallampati: II  TM Distance: >3 FB Neck ROM: full    Dental no notable dental hx. (+) Teeth Intact   Pulmonary neg pulmonary ROS, neg shortness of breath, former smoker,    Pulmonary exam normal breath sounds clear to auscultation       Cardiovascular Exercise Tolerance: Poor hypertension, On Medications + Peripheral Vascular Disease  negative cardio ROS   Rhythm:regular Rate:Normal     Neuro/Psych  Neuromuscular disease CVA, No Residual Symptoms negative neurological ROS  negative psych ROS   GI/Hepatic negative GI ROS, Neg liver ROS, GERD  Medicated,  Endo/Other  negative endocrine ROSdiabetes  Renal/GU Renal disease     Musculoskeletal   Abdominal   Peds  Hematology negative hematology ROS (+)   Anesthesia Other Findings   Reproductive/Obstetrics negative OB ROS                             Anesthesia Physical Anesthesia Plan  ASA: IV  Anesthesia Plan: MAC   Post-op Pain Management:    Induction:   PONV Risk Score and Plan: 2 and Ondansetron and Dexamethasone  Airway Management Planned:   Additional Equipment:   Intra-op Plan:   Post-operative Plan:   Informed Consent: I have reviewed the patients History and Physical, chart, labs and discussed the procedure including the risks, benefits and alternatives for the proposed anesthesia with the patient or authorized representative who has indicated his/her understanding and acceptance.     Plan Discussed with: CRNA  Anesthesia Plan Comments:         Anesthesia Quick Evaluation

## 2017-08-07 NOTE — Transfer of Care (Signed)
Immediate Anesthesia Transfer of Care Note  Patient: Ian Shaffer  Procedure(s) Performed: Procedure(s): REMOVAL OF PLEUR X CATHETER (Right)  Patient Location: PACU  Anesthesia Type:General  Level of Consciousness: awake, alert  and oriented  Airway & Oxygen Therapy: Patient Spontanous Breathing  Post-op Assessment: Report given to RN and Post -op Vital signs reviewed and stable  Post vital signs: Reviewed and stable  Last Vitals:  Vitals:   08/07/17 1112 08/07/17 1250  BP: 139/77 120/65  Pulse: (!) 59 (!) 57  Resp: 18 14  Temp: (!) 36.4 C   SpO2: 100% 97%    Last Pain:  Vitals:   08/07/17 1112  TempSrc: Oral  PainSc: 0-No pain         Complications: No apparent anesthesia complications

## 2017-08-07 NOTE — Discharge Instructions (Addendum)
° ° °  May resume aspirin and plavix tomorrow 08/08/17 per Dr. Genevive Bi.     AMBULATORY SURGERY  DISCHARGE INSTRUCTIONS   1) The drugs that you were given will stay in your system until tomorrow so for the next 24 hours you should not:  A) Drive an automobile B) Make any legal decisions C) Drink any alcoholic beverage   2) You may resume regular meals tomorrow.  Today it is better to start with liquids and gradually work up to solid foods.  You may eat anything you prefer, but it is better to start with liquids, then soup and crackers, and gradually work up to solid foods.   3) Please notify your doctor immediately if you have any unusual bleeding, trouble breathing, redness and pain at the surgery site, drainage, fever, or pain not relieved by medication.    4) Additional Instructions:        Please contact your physician with any problems or Same Day Surgery at [phone], Monday through Friday 6 am to 4 pm, or [place] at [facility] number at [phone].

## 2017-08-11 ENCOUNTER — Other Ambulatory Visit: Payer: Medicare Other

## 2017-08-11 ENCOUNTER — Ambulatory Visit (HOSPITAL_COMMUNITY)
Admission: RE | Admit: 2017-08-11 | Discharge: 2017-08-11 | Disposition: A | Discharge status: Home / Self Care | Payer: Medicare Other | Source: Ambulatory Visit | Attending: Surgery | Admitting: Surgery

## 2017-08-11 ENCOUNTER — Encounter: Payer: Self-pay | Admitting: Family

## 2017-08-11 ENCOUNTER — Ambulatory Visit (INDEPENDENT_AMBULATORY_CARE_PROVIDER_SITE_OTHER): Payer: Medicare Other | Admitting: Family

## 2017-08-11 VITALS — BP 133/78 | HR 93 | Temp 98.4°F | Resp 20 | Ht 70.0 in | Wt 202.0 lb

## 2017-08-11 DIAGNOSIS — I739 Peripheral vascular disease, unspecified: Secondary | ICD-10-CM

## 2017-08-11 DIAGNOSIS — I6522 Occlusion and stenosis of left carotid artery: Secondary | ICD-10-CM

## 2017-08-11 DIAGNOSIS — I779 Disorder of arteries and arterioles, unspecified: Secondary | ICD-10-CM | POA: Insufficient documentation

## 2017-08-11 DIAGNOSIS — Z8673 Personal history of transient ischemic attack (TIA), and cerebral infarction without residual deficits: Secondary | ICD-10-CM

## 2017-08-11 LAB — VAS US CAROTID
LCCADSYS: 70 cm/s
LCCAPSYS: 112 cm/s
LEFT ECA DIAS: -22 cm/s
LEFT VERTEBRAL DIAS: 19 cm/s
Left CCA dist dias: 11 cm/s
Left CCA prox dias: 15 cm/s
Left ICA prox sys: 22 cm/s
RCCAPDIAS: 27 cm/s
RIGHT CCA MID DIAS: 20 cm/s
RIGHT ECA DIAS: -16 cm/s
RIGHT VERTEBRAL DIAS: 18 cm/s
Right CCA prox sys: 171 cm/s
Right cca dist sys: -55 cm/s

## 2017-08-11 NOTE — Patient Instructions (Signed)
Stroke Prevention Some medical conditions and behaviors are associated with an increased chance of having a stroke. You may prevent a stroke by making healthy choices and managing medical conditions. How can I reduce my risk of having a stroke?  Stay physically active. Get at least 30 minutes of activity on most or all days.  Do not smoke. It may also be helpful to avoid exposure to secondhand smoke.  Limit alcohol use. Moderate alcohol use is considered to be:  No more than 2 drinks per day for men.  No more than 1 drink per day for nonpregnant women.  Eat healthy foods. This involves:  Eating 5 or more servings of fruits and vegetables a day.  Making dietary changes that address high blood pressure (hypertension), high cholesterol, diabetes, or obesity.  Manage your cholesterol levels.  Making food choices that are high in fiber and low in saturated fat, trans fat, and cholesterol may control cholesterol levels.  Take any prescribed medicines to control cholesterol as directed by your health care provider.  Manage your diabetes.  Controlling your carbohydrate and sugar intake is recommended to manage diabetes.  Take any prescribed medicines to control diabetes as directed by your health care provider.  Control your hypertension.  Making food choices that are low in salt (sodium), saturated fat, trans fat, and cholesterol is recommended to manage hypertension.  Ask your health care provider if you need treatment to lower your blood pressure. Take any prescribed medicines to control hypertension as directed by your health care provider.  If you are 18-39 years of age, have your blood pressure checked every 3-5 years. If you are 40 years of age or older, have your blood pressure checked every year.  Maintain a healthy weight.  Reducing calorie intake and making food choices that are low in sodium, saturated fat, trans fat, and cholesterol are recommended to manage  weight.  Stop drug abuse.  Avoid taking birth control pills.  Talk to your health care provider about the risks of taking birth control pills if you are over 35 years old, smoke, get migraines, or have ever had a blood clot.  Get evaluated for sleep disorders (sleep apnea).  Talk to your health care provider about getting a sleep evaluation if you snore a lot or have excessive sleepiness.  Take medicines only as directed by your health care provider.  For some people, aspirin or blood thinners (anticoagulants) are helpful in reducing the risk of forming abnormal blood clots that can lead to stroke. If you have the irregular heart rhythm of atrial fibrillation, you should be on a blood thinner unless there is a good reason you cannot take them.  Understand all your medicine instructions.  Make sure that other conditions (such as anemia or atherosclerosis) are addressed. Get help right away if:  You have sudden weakness or numbness of the face, arm, or leg, especially on one side of the body.  Your face or eyelid droops to one side.  You have sudden confusion.  You have trouble speaking (aphasia) or understanding.  You have sudden trouble seeing in one or both eyes.  You have sudden trouble walking.  You have dizziness.  You have a loss of balance or coordination.  You have a sudden, severe headache with no known cause.  You have new chest pain or an irregular heartbeat. Any of these symptoms may represent a serious problem that is an emergency. Do not wait to see if the symptoms will go away.   Get medical help at once. Call your local emergency services (911 in [place] Do not drive yourself to the hospital. This information is not intended to replace advice given to you by your health care provider. Make sure you discuss any questions you have with your health care provider. Document Released: 12/19/2004 Document Revised: 04/18/2016 Document Reviewed: 05/14/2013 Elsevier  Interactive Patient Education  2017 Elsevier Inc.  

## 2017-08-11 NOTE — Anesthesia Postprocedure Evaluation (Signed)
Anesthesia Post Note  Patient: Hill Mackie Farrey  Procedure(s) Performed: Procedure(s) (LRB): REMOVAL OF PLEUR X CATHETER (Right)  Patient location during evaluation: PACU Anesthesia Type: MAC Level of consciousness: awake and alert Pain management: pain level controlled Vital Signs Assessment: post-procedure vital signs reviewed and stable Respiratory status: spontaneous breathing, nonlabored ventilation, respiratory function stable and patient connected to nasal cannula oxygen Cardiovascular status: blood pressure returned to baseline and stable Postop Assessment: no apparent nausea or vomiting Anesthetic complications: no     Last Vitals:  Vitals:   08/07/17 1325 08/07/17 1342  BP: 112/62 122/62  Pulse: (!) 58 (!) 51  Resp: 16 16  Temp: (!) 36.4 C   SpO2: 99% 99%    Last Pain:  Vitals:   08/08/17 0813  TempSrc:   PainSc: 0-No pain                 Molli Barrows

## 2017-08-11 NOTE — Progress Notes (Signed)
Chief Complaint: Follow up Extracranial Carotid Artery Stenosis   History of Present Illness  Ian Shaffer is a 71 y.o. male patient of Dr. Trula Slade who has a history of a stroke on 02/26/14 (small acute left ventricular white matter lacunar infarct) and a known left ICA occlusion. He returns today for follow up. Patient has not had previous carotid artery intervention. Dr. Trula Slade reviewed pt carotid Duplex from another facility at pt's June 2015 visit. He had an occluded left internal carotid artery and minimal stenosis on the right, less than 39%. His outside MRI report shows a small acute left ventricular white matter a lacunar infarct.  His stroke was manifested as right upper extremity weakness and loss of hand dexterity that lasted 3 weeks, dizziness; he denies amaurosis fugax, denies speech difficulties during his stroke. He denies any subsequent stroke or TIA symptoms.   He had dyspnea which led to an evaluation which found a pleural effusion and tumor in his right lower lung, negative lymph nodes. 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 He has an appointment on 08-14-17 for an oncology consult at [facility]. He is receiving chemotherapy now. He had his first tx last week. He receives a corticosteroid with his chemo; his Victoza dose was increased to cover secondary hyperglycemia.   Pt denies claudication symptoms with walking, denies non healing wounds.  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    Pt Diabetic: yes, states his last A1C was 6.1 Pt smoker: former smoker of a pipe only, quit in 1975  Pt meds include: Statin : yes ASA: yes Other anticoagulants/antiplatelets: Plavix     Past Medical History:  Diagnosis Date  . Benign essential hypertension 05/31/2014  . Cancer ([place]) 06/2017   right lung   . Cerebral infarction ([place]) 05/31/2014  . Cerebrovascular disease 05/31/2014  . CKD (chronic kidney disease) stage 3, GFR 30-59 ml/min 02/23/2016  . CVA (cerebral infarction)    . Diabetes mellitus without complication ([place])   . Difficult intubation   . DVT (deep venous thrombosis) ([place])   . Esophageal reflux 05/31/2014  . H/O: CVA (cerebrovascular accident)   . Hyperlipidemia   . Hypertension   . ICAO (internal carotid artery occlusion) March 01, 2014   Left  . 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 Pleural effusion 04/23/2017  . Stroke [facility]) April, 4,2015  . Type 2 diabetes mellitus with peripheral neuropathy ([place]) 02/23/2016  . Weakness     Social History Social History  Substance Use Topics  . Smoking status: Former Smoker    Types: Pipe    Quit date: 05/07/1974  . Smokeless tobacco: Never Used     Comment: pt states he smoked a pipe a long time ago  . Alcohol use No    Family History Family History  Problem Relation Age of Onset  . Hypertension Mother   . Hypertension Father     Surgical History Past Surgical History:  Procedure Laterality Date  . CHEST TUBE INSERTION N/A 06/25/2017   Procedure: LKGMWN CATHETER INSERTION;  Surgeon: Nestor Lewandowsky, MD;  Location: [facility] ORS;  Service: Thoracic;  Laterality: N/A;  . CHEST TUBE INSERTION Right 08/07/2017   Procedure: REMOVAL OF PLEUR X CATHETER;  Surgeon: Nestor Lewandowsky, MD;  Location: [facility] ORS;  Service: General;  Laterality: Right;  . NASAL SINUS SURGERY  2000  . TONSILLECTOMY    . VIDEO ASSISTED THORACOSCOPY Right 06/25/2017   Procedure: VIDEO ASSISTED THORACOSCOPY WITH TALC PLEURODESIS, CHEST TUBE INSERTION;  Surgeon:  Nestor Lewandowsky, MD;  Location: [facility] ORS;  Service: Thoracic;  Laterality: Right;    Allergies  Allergen Reactions  . Sulfa Antibiotics Swelling    Facial swelling No tongue or lips swelling, no difficulty breathing.  . Adhesive [Tape] Itching and Rash  . Amoxicillin Nausea Only    Has patient had a PCN reaction causing immediate rash, facial/tongue/throat swelling, SOB or lightheadedness with hypotension: No Has patient had a PCN reaction causing severe  rash involving mucus membranes or skin necrosis: No Has patient had a PCN reaction that required hospitalization: No Has patient had a PCN reaction occurring within the last 10 years: Yes If all of the above answers are "NO", then may proceed with Cephalosporin use.     Current Outpatient Prescriptions  Medication Sig Dispense Refill  . amLODipine (NORVASC) 5 MG tablet Take 5 mg by mouth daily.    Marland Kitchen aspirin EC 81 MG tablet Take 81 mg by mouth daily.    . clopidogrel (PLAVIX) 75 MG tablet Take 75 mg by mouth daily.    Marland Kitchen dexamethasone (DECADRON) 4 MG tablet Take one pill AM & PM x 3 days; start the day prior to chemo. 60 tablet 0  . fluticasone (FLONASE) 50 MCG/ACT nasal spray Place 1 spray into both nostrils daily at 2 PM.     . folic acid (FOLVITE) 1 MG tablet Take 1 tablet (1 mg total) by mouth daily. (Patient taking differently: Take 1 mg by mouth daily at 2 PM. ) 90 tablet 1  . Homeopathic Products (NERVE PAIN RELIEF SL) Take 1 tablet by mouth daily. NERVE RENEW (METHYLCOBALAMIN-BENFOTIAMINE-STABILIZED R-ALPHA LIPOIC ACID)    . liraglutide (VICTOZA) 18 MG/3ML SOPN Inject 1.2 mg into the skin daily.    Marland Kitchen lisinopril (PRINIVIL,ZESTRIL) 10 MG tablet Take 10 mg by mouth daily.    . metoprolol succinate (TOPROL-XL) 25 MG 24 hr tablet Take 25 mg by mouth daily.    . Multiple Vitamin (MULTIVITAMIN WITH MINERALS) TABS tablet Take 1 tablet by mouth daily. Centrum Silver    . ondansetron (ZOFRAN) 8 MG tablet Take 1 tablet (8 mg total) by mouth every 8 (eight) hours as needed for nausea or vomiting (start 3 days; after chemo). 40 tablet 1  . pantoprazole (PROTONIX) 40 MG tablet Take 40 mg by mouth daily before breakfast.    . polyethylene glycol powder (GLYCOLAX/MIRALAX) powder Take 17 g by mouth daily as needed (for constipation.).    Marland Kitchen pravastatin (PRAVACHOL) 40 MG tablet Take 40 mg by mouth daily.    . prochlorperazine (COMPAZINE) 10 MG tablet Take 1 tablet (10 mg total) by mouth every 6 (six)  hours as needed for nausea or vomiting. 40 tablet 1   No current facility-administered medications for this visit.     Review of Systems : See HPI for pertinent positives and negatives.  Physical Examination  Vitals:   08/11/17 1434 08/11/17 1435  BP: 121/80 133/78  Pulse: 93   Resp: 20   Temp: 98.4 F (36.9 C)   TempSrc: Oral   SpO2: 97%   Weight: 202 lb (91.6 kg)   Height: 5\' 10"  (1.778 m)    Body mass index is 28.98 kg/m.  General: WDWN male in NAD GAIT: normal Eyes: PERRLA Pulmonary: Respirations are non-labored, CTAB, no adventitious sounds.  Cardiac: regular rhythm, no detected murmur.  VASCULAR EXAM Carotid Bruits Right Left   Negative Negative    Abdominal aortic pulse is not palpable. Radial pulses are 2+ palpable and equal.  LE Pulses Right Left   POPLITEAL not palpable  not palpable   POSTERIOR TIBIAL 2+ palpable  2+ palpable    DORSALIS PEDIS  ANTERIOR TIBIAL 2+ palpable  2+ palpable     Gastrointestinal: soft, nontender, BS WNL, no r/g, no palpable masses.  Musculoskeletal: No muscle atrophy/wasting. M/S 5/5 throughout, extremities without ischemic changes.  Neurologic: A&O X 3; appropriate affect; speech is normal, CN 2-12 intact, Pain and light touch intact in extremities, Motor exam as listed above     Assessment: Ian Shaffer is a 71 y.o. male who has a history of a stroke on 02/26/14 (small acute left ventricular white matter lacunar infarct) and a known left ICA occlusion. He has had no subsequent stroke or TIA.  DATA Carotid Duplex (08/11/17): 1-39% right ICA stenosis and confirmed left ICA occlusion.  Bilateral vertebral artery flow is antegrade.  Bilateral subclavian artery waveforms are normal.  No significant change compared to exams on 05-08-15  and 05-13-16.    Plan: Follow-up in 1 year with Carotid Duplex scan.    I discussed in depth with the patient the nature of atherosclerosis, and emphasized the importance of maximal medical management including strict control of blood pressure, blood glucose, and lipid levels, obtaining regular exercise, and continued cessation of smoking.  The patient is aware that without maximal medical management the underlying atherosclerotic disease process will progress, limiting the benefit of any interventions. The patient was given information about stroke prevention and what symptoms should prompt the patient to seek immediate medical care. Thank you for allowing [place] to participate in this patient's care.  Clemon Chambers, RN, MSN, FNP-C [facility] Office: [facility] Physician: Trula Slade  08/11/17 2:44 PM

## 2017-08-12 ENCOUNTER — Other Ambulatory Visit: Payer: Self-pay

## 2017-08-12 ENCOUNTER — Telehealth: Payer: Self-pay | Admitting: *Deleted

## 2017-08-12 NOTE — Telephone Encounter (Signed)
Pt called in to report that sinus congestion has been getting worse over the weekend. He has been taking claritin and sudafed as instructed with no relief in symptoms. Pt states that wants to follow up with Dr. Kathyrn Sheriff regarding possible sinus infection but wanted to get recommendation from Dr. Rogue Bussing as well. Pt states that has yellow nasal drainage and had 1 episode of nasal bleeding this morning. No reports of fever. Please advise.

## 2017-08-12 NOTE — Telephone Encounter (Signed)
Pt called back and stated has appt scheduled today at 3pm to see Dr. Kathyrn Sheriff.

## 2017-08-13 ENCOUNTER — Inpatient Hospital Stay: Payer: Medicare Other

## 2017-08-13 ENCOUNTER — Telehealth: Payer: Self-pay | Admitting: *Deleted

## 2017-08-13 ENCOUNTER — Inpatient Hospital Stay (HOSPITAL_BASED_OUTPATIENT_CLINIC_OR_DEPARTMENT_OTHER): Payer: Medicare Other | Admitting: Oncology

## 2017-08-13 VITALS — BP 143/88 | HR 84 | Temp 97.1°F | Resp 20 | Ht 70.0 in | Wt 200.0 lb

## 2017-08-13 DIAGNOSIS — J329 Chronic sinusitis, unspecified: Secondary | ICD-10-CM | POA: Diagnosis not present

## 2017-08-13 DIAGNOSIS — D701 Agranulocytosis secondary to cancer chemotherapy: Secondary | ICD-10-CM | POA: Diagnosis not present

## 2017-08-13 DIAGNOSIS — Z79899 Other long term (current) drug therapy: Secondary | ICD-10-CM

## 2017-08-13 DIAGNOSIS — E871 Hypo-osmolality and hyponatremia: Secondary | ICD-10-CM | POA: Diagnosis not present

## 2017-08-13 DIAGNOSIS — J9859 Other diseases of mediastinum, not elsewhere classified: Secondary | ICD-10-CM

## 2017-08-13 DIAGNOSIS — C801 Malignant (primary) neoplasm, unspecified: Secondary | ICD-10-CM | POA: Diagnosis not present

## 2017-08-13 DIAGNOSIS — T451X5A Adverse effect of antineoplastic and immunosuppressive drugs, initial encounter: Secondary | ICD-10-CM

## 2017-08-13 DIAGNOSIS — E860 Dehydration: Secondary | ICD-10-CM | POA: Diagnosis not present

## 2017-08-13 DIAGNOSIS — R21 Rash and other nonspecific skin eruption: Secondary | ICD-10-CM

## 2017-08-13 DIAGNOSIS — T451X5S Adverse effect of antineoplastic and immunosuppressive drugs, sequela: Secondary | ICD-10-CM

## 2017-08-13 LAB — CBC WITH DIFFERENTIAL/PLATELET
BASOS ABS: 0 10*3/uL (ref 0–0.1)
BASOS PCT: 1 %
EOS PCT: 7 %
Eosinophils Absolute: 0.1 10*3/uL (ref 0–0.7)
HEMATOCRIT: 35.3 % — AB (ref 40.0–52.0)
Hemoglobin: 12.2 g/dL — ABNORMAL LOW (ref 13.0–18.0)
Lymphocytes Relative: 31 %
Lymphs Abs: 0.6 10*3/uL — ABNORMAL LOW (ref 1.0–3.6)
MCH: 29.5 pg (ref 26.0–34.0)
MCHC: 34.7 g/dL (ref 32.0–36.0)
MCV: 85.1 fL (ref 80.0–100.0)
MONO ABS: 0.2 10*3/uL (ref 0.2–1.0)
Monocytes Relative: 11 %
NEUTROS ABS: 0.9 10*3/uL — AB (ref 1.4–6.5)
Neutrophils Relative %: 50 %
PLATELETS: 176 10*3/uL (ref 150–440)
RBC: 4.14 MIL/uL — AB (ref 4.40–5.90)
RDW: 12.7 % (ref 11.5–14.5)
WBC: 1.8 10*3/uL — AB (ref 3.8–10.6)

## 2017-08-13 LAB — COMPREHENSIVE METABOLIC PANEL
ALBUMIN: 3.8 g/dL (ref 3.5–5.0)
ALT: 29 U/L (ref 17–63)
AST: 30 U/L (ref 15–41)
Alkaline Phosphatase: 81 U/L (ref 38–126)
Anion gap: 8 (ref 5–15)
BUN: 29 mg/dL — AB (ref 6–20)
CALCIUM: 9.3 mg/dL (ref 8.9–10.3)
CHLORIDE: 98 mmol/L — AB (ref 101–111)
CO2: 27 mmol/L (ref 22–32)
Creatinine, Ser: 1.38 mg/dL — ABNORMAL HIGH (ref 0.61–1.24)
GFR calc Af Amer: 57 mL/min — ABNORMAL LOW (ref >60)
GFR calc non Af Amer: 49 mL/min — ABNORMAL LOW (ref >60)
GLUCOSE: 154 mg/dL — AB (ref 65–99)
POTASSIUM: 4.7 mmol/L (ref 3.5–5.1)
Sodium: 133 mmol/L — ABNORMAL LOW (ref 135–145)
TOTAL PROTEIN: 6.9 g/dL (ref 6.5–8.1)
Total Bilirubin: 0.7 mg/dL (ref 0.3–1.2)

## 2017-08-13 MED ORDER — TRIAMCINOLONE ACETONIDE 0.5 % EX OINT: 1.0000 "application " | TOPICAL_OINTMENT | Freq: Two times a day (BID) | CUTANEOUS | 0 refills | Status: DC

## 2017-08-13 MED ORDER — PREDNISONE 10 MG PO TABS: ORAL_TABLET | ORAL | 0 refills | Status: DC

## 2017-08-13 NOTE — Progress Notes (Signed)
Reports rash- starting 3 days s/p last chemotherapy. described as a "firey burning rash"

## 2017-08-13 NOTE — Progress Notes (Signed)
Symptom Management Consult note [facility]  [phone] [phone]  Patient Care Team: Derinda Late, MD as PCP - General (Family Medicine) Margaretha Sheffield, MD (Otolaryngology) Telford Nab, RN as Registered Nurse Nestor Lewandowsky, MD as Referring Physician (Cardiothoracic Surgery) Cammie Sickle, MD as Consulting Physician (Internal Medicine)   Name of the patient: Ian Shaffer  [id number]  01-20-1946   Date of visit: 08/13/17  Diagnosis- Malignant pleural effusion  Chief complaint/ Reason for visit- Rash   Heme/Onc history: # July-AUG 2018- right pleural based mass ~10 cm [ above & below diaphragm]; s/p VATS- Dr.Oaks-Bx- 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 PN   Interval history- Patient had his first chemotherapy treatment Carbo/Altimta on 08/08/17. He was instructed to take 4 mg Decadron Thursday, Friday and Saturday which he did. He started to develop a stinging/itchy rash on Saturday evening. It has become progressively worse each day. Last night he was unable to have clothes touching his chest and neck because it was so painful. The only thing he has tried is Aveeno lotion which did not help any. He has not tried Benadryl. He states it feels like a "sunburn". He was started on Levaquin yesterday for a sinus infection by ENT but has never had any issues taking Levaquin in the past. He denies fevers or illnesses. He has no chest pain. He denies any nausea, vomiting, constipation, or diarrhea.  He has no urinary complaints.   ECOG FS:0 - Asymptomatic  Review of systems- Review of Systems  Constitutional: Positive for malaise/fatigue. Negative for chills and fever.  HENT: Negative.   Eyes: Negative for blurred vision, double vision and photophobia.  Respiratory: Negative.   Cardiovascular: Negative for chest pain, palpitations and orthopnea.  Gastrointestinal: Negative.  Negative for nausea.  Genitourinary: Negative.   Musculoskeletal: Negative.   Skin: Positive for itching and rash.  Neurological: Positive for weakness.  Endo/Heme/Allergies: Negative.   Psychiatric/Behavioral: Negative.      Current treatment- carboplatin and Alimta every 3 weeks  Allergies  Allergen Reactions  . Sulfa Antibiotics Swelling    Facial swelling No tongue or lips swelling, no difficulty breathing.  . Adhesive [Tape] Itching and Rash  . Amoxicillin Nausea Only    Has patient had a PCN reaction causing immediate rash, facial/tongue/throat swelling, SOB or lightheadedness with hypotension: No Has patient had a PCN reaction causing severe rash involving mucus membranes or skin necrosis: No Has patient had a PCN reaction that required hospitalization: No Has patient had a PCN reaction occurring within the last 10 years: Yes If all of the above answers are "NO", then may proceed with Cephalosporin use.      Past Medical History:  Diagnosis Date  . Benign essential hypertension 05/31/2014  . Cancer ([place]) 06/2017   right lung   . Cerebral infarction ([place]) 05/31/2014  . Cerebrovascular disease 05/31/2014  . CKD (chronic kidney disease) stage 3, GFR 30-59 ml/min 02/23/2016  . CVA (cerebral infarction)   . Diabetes mellitus without complication ([place])   . Difficult intubation   . DVT (deep venous thrombosis) ([place])   . Esophageal  reflux 05/31/2014  . H/O: CVA (cerebrovascular accident)   . Hyperlipidemia   . Hypertension   . ICAO (internal carotid artery occlusion) March 01, 2014   Left  .  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 Pleural effusion 04/23/2017  . Stroke [facility]) April, 4,2015  . Type 2 diabetes mellitus with peripheral neuropathy ([place]) 02/23/2016  . Weakness      Past Surgical History:  Procedure Laterality Date  . CHEST TUBE INSERTION N/A 06/25/2017   Procedure: ZOXWRU CATHETER INSERTION;  Surgeon: Nestor Lewandowsky, MD;  Location: [facility] ORS;  Service: Thoracic;  Laterality: N/A;  . CHEST TUBE INSERTION Right 08/07/2017   Procedure: REMOVAL OF PLEUR X CATHETER;  Surgeon: Nestor Lewandowsky, MD;  Location: [facility] ORS;  Service: General;  Laterality: Right;  . NASAL SINUS SURGERY  2000  . TONSILLECTOMY    . VIDEO ASSISTED THORACOSCOPY Right 06/25/2017   Procedure: VIDEO ASSISTED THORACOSCOPY WITH TALC PLEURODESIS, CHEST TUBE INSERTION;  Surgeon: Nestor Lewandowsky, MD;  Location: [facility] ORS;  Service: Thoracic;  Laterality: Right;    Social History   Social History  . Marital status: Married    Spouse name: N/A  . Number of children: N/A  . Years of education: N/A   Occupational History  . Not on file.   Social History Main Topics  . Smoking status: Former Smoker    Types: Pipe    Quit date: 05/07/1974  . Smokeless tobacco: Never Used     Comment: pt states he smoked a pipe a long time ago  . Alcohol use No  . Drug use: No  . Sexual activity: Not on file   Other Topics Concern  . Not on file   Social History Narrative  . No narrative on file    Family History  Problem Relation Age of Onset  . Hypertension Mother   . Hypertension Father      Current Outpatient Prescriptions:  .  amLODipine (NORVASC) 5 MG tablet, Take 5 mg by mouth daily., Disp: , Rfl:  .  aspirin EC 81 MG tablet, Take 81 mg by mouth daily., Disp: , Rfl:  .  clopidogrel (PLAVIX) 75 MG tablet, Take 75 mg by mouth daily., Disp: , Rfl:  .  dexamethasone (DECADRON) 4 MG tablet, Take one pill AM & PM x 3 days; start the day prior to chemo., Disp: 60 tablet, Rfl: 0 .   fluticasone (FLONASE) 50 MCG/ACT nasal spray, Place 1 spray into both nostrils daily at 2 PM. , Disp: , Rfl:  .  folic acid (FOLVITE) 1 MG tablet, Take 1 tablet (1 mg total) by mouth daily. (Patient taking differently: Take 1 mg by mouth daily at 2 PM. ), Disp: 90 tablet, Rfl: 1 .  Homeopathic Products (NERVE PAIN RELIEF SL), Take 1 tablet by mouth daily. NERVE RENEW (METHYLCOBALAMIN-BENFOTIAMINE-STABILIZED R-ALPHA LIPOIC ACID), Disp: , Rfl:  .  levofloxacin (LEVAQUIN) 500 MG tablet, Take 500 mg by mouth daily. X 10 days, Disp: , Rfl:  .  liraglutide (VICTOZA) 18 MG/3ML SOPN, Inject 1.2 mg into the skin daily., Disp: , Rfl:  .  lisinopril (PRINIVIL,ZESTRIL) 10 MG tablet, Take 10 mg by mouth daily., Disp: , Rfl:  .  loratadine (CLARITIN) 10 MG tablet, Take 10 mg by mouth daily as needed for allergies or rhinitis., Disp: , Rfl:  .  metoprolol succinate (TOPROL-XL) 25 MG 24 hr tablet, Take 25 mg by mouth daily., Disp: , Rfl:  .  Multiple Vitamin (MULTIVITAMIN WITH MINERALS) TABS  tablet, Take 1 tablet by mouth daily. Centrum Silver, Disp: , Rfl:  .  ondansetron (ZOFRAN) 8 MG tablet, Take 8 mg by mouth every 8 (eight) hours as needed for nausea or refractory nausea / vomiting. , Disp: , Rfl:  .  polyethylene glycol powder (GLYCOLAX/MIRALAX) powder, Take 17 g by mouth daily as needed (for constipation.)., Disp: , Rfl:  .  pravastatin (PRAVACHOL) 40 MG tablet, Take 40 mg by mouth daily., Disp: , Rfl:  .  pantoprazole (PROTONIX) 40 MG tablet, Take 40 mg by mouth daily before breakfast., Disp: , Rfl:  .  predniSONE (DELTASONE) 10 MG tablet, Take 20 (2 tabs) mg for 3 days, 10 mg (1 tab) for 3 day and 5 mg (1/2 tab) for 3 days., Disp: 12 tablet, Rfl: 0 .  prochlorperazine (COMPAZINE) 10 MG tablet, Take 10 mg by mouth every 8 (eight) hours as needed for vomiting. , Disp: , Rfl:  .  triamcinolone ointment (KENALOG) 0.5 %, Apply 1 application topically 2 (two) times daily., Disp: 30 g, Rfl: 0  Physical exam:    Vitals:   08/13/17 1021  BP: (!) 143/88  Pulse: 84  Resp: 20  Temp: (!) 97.1 F (36.2 C)  TempSrc: Tympanic  Weight: 200 lb (90.7 kg)  Height: 5\' 10"  (1.778 m)   Physical Exam  Constitutional: He is oriented to person, place, and time and well-developed, well-nourished, and in no distress.  HENT:  Head: Normocephalic and atraumatic.  Eyes: Pupils are equal, round, and reactive to light.  Neck: Normal range of motion.  Cardiovascular: Normal rate, regular rhythm and normal heart sounds.   Pulmonary/Chest: Effort normal and breath sounds normal.  Abdominal: Soft. Bowel sounds are normal.  Musculoskeletal: Normal range of motion.  Neurological: He is alert and oriented to person, place, and time.  Skin: Skin is warm and dry. Rash noted. Rash is maculopapular.     Psychiatric: Affect and judgment normal.     CMP Latest Ref Rng & Units 08/13/2017  Glucose 65 - 99 mg/dL 154(H)  BUN 6 - 20 mg/dL 29(H)  Creatinine 0.61 - 1.24 mg/dL 1.38(H)  Sodium 135 - 145 mmol/L 133(L)  Potassium 3.5 - 5.1 mmol/L 4.7  Chloride 101 - 111 mmol/L 98(L)  CO2 22 - 32 mmol/L 27  Calcium 8.9 - 10.3 mg/dL 9.3  Total Protein 6.5 - 8.1 g/dL 6.9  Total Bilirubin 0.3 - 1.2 mg/dL 0.7  Alkaline Phos 38 - 126 U/L 81  AST 15 - 41 U/L 30  ALT 17 - 63 U/L 29   CBC Latest Ref Rng & Units 08/13/2017  WBC 3.8 - 10.6 K/uL 1.8(L)  Hemoglobin 13.0 - 18.0 g/dL 12.2(L)  Hematocrit 40.0 - 52.0 % 35.3(L)  Platelets 150 - 440 K/uL 176    No images are attached to the encounter.  Ct Biopsy  Result Date: 07/16/2017 INDICATION: Right posterior pleural/retroperitoneal mass EXAM: CT-GUIDED BIOPSY RIGHT POSTERIOR PLEURAL/RETROPERITONEAL MASS MEDICATIONS: 1% lidocaine local ANESTHESIA/SEDATION: 2.0 mg IV Versed; 50 mcg IV Fentanyl Moderate Sedation Time:  21 minutes The patient was continuously monitored during the procedure by the interventional radiology nurse under my direct supervision. PROCEDURE: The procedure,  risks, benefits, and alternatives were explained to the patient. Questions regarding the procedure were encouraged and answered. The patient understands and consents to the procedure. previous imaging reviewed. patient positioned right side down decubitus. noncontrast localization ct performed. the posterior right pleural/retroperitoneal mass was localized. overlying skin marked posteriorly in the right flank area. under sterile conditions  and local anesthesia, a 17 gauge 6.8 cm access needle was advanced from a RIGHT posterior lower intercostal approach to the lesion. needle position confirmed within the lesion. several 18 gauge core biopsies performed at 2 separate locations within the mass. samples were placed in formalin. needle tract embolized with gel-foam. needle removed. postprocedure imaging demonstrates a small amount of hemorrhage adjacent to the mass but no large hematoma. Patient tolerated the procedure well without complication. Vital sign monitoring by nursing staff during the procedure will continue as patient is in the special procedures unit for post procedure observation. FINDINGS: The images document guide needle placement within the right posterior pleural/retroperitoneal mass. Post biopsy images demonstrate trace amount of surrounding hemorrhage. COMPLICATIONS: None immediate. IMPRESSION: Successful CT-guided core biopsy of the right posterior pleural/retroperitoneal mass Electronically Signed   By: Jerilynn Mages.  Shick M.D.   On: 07/16/2017 12:01     Assessment and plan- Patient is a 71 y.o. male who presents for a worsening rash that began late Saturday evening. The rash is maculopapular in nature. It is warm to the touch and red. Patient is afebrile. Vital signs stable. Labs were drawn. He is mildly hyponatremic (133). His kidney function is slightly elevated. He is leukopenic (1.8) and anemic (12.2). Absolute neutrophil count is 0.9.   1. Maculopapular rash on chest: RX Prednisone 20 mg for 3  days, 10 mg 3 days and 5 mg 3 days. RX Kenalog cream. Can take Benadryl if needed.  2. Sinus Infection: Continue Levaquin as prescribed by ENT.  3. Neutropenia: Continue neutropenic precautions. Will get labs on Friday before treatment.  4. Dehydration: Encouraged the patient to drink plenty of fluids. 5. RTC on Friday as scheduled to see Dr. Rhea Bleacher and cycle 2 of carbo/alimta.     Visit Diagnosis 1. Maculopapular rash, localized   2. Dehydration   3. Chemotherapy-induced neutropenia (HCC)     Patient expressed understanding and was in agreement with this plan. He also understands that He can call clinic at any time with any questions, concerns, or complaints.    Marisue Humble [facility] Pager- [phone] 08/13/2017 1:26 PM

## 2017-08-13 NOTE — Telephone Encounter (Signed)
Pt called in to report rash on chest and neck that is red, raised, and burning. States that rash appeared over the weekend but worsened over night. Saw Dr. Kathyrn Sheriff yesterday who prescribed levequin for sinus infection. Pt was advised not to take antihistamines by Dr. Kathyrn Sheriff due to dry sinuses. Per Sonia Baller, can see today to check labs and evaluate rash. appt given to pt to arrive at 10:15am for labs then to see Sonia Baller, NP following labwork. Pt verbalized understanding.

## 2017-08-15 ENCOUNTER — Inpatient Hospital Stay: Payer: Medicare Other

## 2017-08-15 ENCOUNTER — Ambulatory Visit (INDEPENDENT_AMBULATORY_CARE_PROVIDER_SITE_OTHER): Payer: Medicare Other | Admitting: Cardiothoracic Surgery

## 2017-08-15 ENCOUNTER — Encounter: Payer: Self-pay | Admitting: Cardiothoracic Surgery

## 2017-08-15 DIAGNOSIS — J910 Malignant pleural effusion: Secondary | ICD-10-CM

## 2017-08-15 NOTE — Progress Notes (Signed)
  Patient ID: Ian Shaffer, male   DOB: [birth year], 71 y.o.   MRN: [id number]  HISTORY: He comes in today for removal of his sutures. He did have his catheter removed about 10 days ago.   There were no vitals filed for this visit.   EXAM:  The catheter exit and insertion sites all look clean and dry. There is no drainage. The wounds are well approximated.   ASSESSMENT: Status post removal of Pleurx catheter   PLAN:   We will see the patient back as needed. We removed his sutures today and Steri-Strip the wound. Wound care instructions were given. He will come back and see [place] if he needs anything further.    Nestor Lewandowsky, MD

## 2017-08-15 NOTE — Patient Instructions (Signed)
We have removed sutures today and placed steri-strips. These will fall off on there own you may shower as normal.  If you have any questions or concerns please give our office a call.

## 2017-08-19 LAB — SURGICAL PATHOLOGY

## 2017-08-20 ENCOUNTER — Ambulatory Visit: Payer: Medicare Other | Admitting: Internal Medicine

## 2017-08-20 ENCOUNTER — Ambulatory Visit: Payer: Medicare Other

## 2017-08-20 ENCOUNTER — Encounter: Payer: Self-pay | Admitting: Internal Medicine

## 2017-08-20 ENCOUNTER — Other Ambulatory Visit: Payer: Medicare Other

## 2017-08-27 ENCOUNTER — Encounter: Payer: Self-pay | Admitting: Internal Medicine

## 2017-08-27 ENCOUNTER — Other Ambulatory Visit: Payer: Self-pay | Admitting: *Deleted

## 2017-08-27 ENCOUNTER — Inpatient Hospital Stay: Payer: Medicare Other

## 2017-08-27 ENCOUNTER — Inpatient Hospital Stay: Payer: Medicare Other | Attending: Internal Medicine

## 2017-08-27 ENCOUNTER — Inpatient Hospital Stay (HOSPITAL_BASED_OUTPATIENT_CLINIC_OR_DEPARTMENT_OTHER): Payer: Medicare Other | Admitting: Internal Medicine

## 2017-08-27 VITALS — BP 154/76 | HR 73 | Temp 97.6°F | Resp 20 | Ht 70.0 in | Wt 201.0 lb

## 2017-08-27 DIAGNOSIS — Z79899 Other long term (current) drug therapy: Secondary | ICD-10-CM | POA: Diagnosis not present

## 2017-08-27 DIAGNOSIS — Z7982 Long term (current) use of aspirin: Secondary | ICD-10-CM | POA: Diagnosis not present

## 2017-08-27 DIAGNOSIS — T451X5S Adverse effect of antineoplastic and immunosuppressive drugs, sequela: Secondary | ICD-10-CM | POA: Diagnosis not present

## 2017-08-27 DIAGNOSIS — J90 Pleural effusion, not elsewhere classified: Secondary | ICD-10-CM | POA: Insufficient documentation

## 2017-08-27 DIAGNOSIS — Z87891 Personal history of nicotine dependence: Secondary | ICD-10-CM | POA: Diagnosis not present

## 2017-08-27 DIAGNOSIS — Z5111 Encounter for antineoplastic chemotherapy: Secondary | ICD-10-CM | POA: Insufficient documentation

## 2017-08-27 DIAGNOSIS — I129 Hypertensive chronic kidney disease with stage 1 through stage 4 chronic kidney disease, or unspecified chronic kidney disease: Secondary | ICD-10-CM | POA: Insufficient documentation

## 2017-08-27 DIAGNOSIS — D709 Neutropenia, unspecified: Secondary | ICD-10-CM | POA: Diagnosis not present

## 2017-08-27 DIAGNOSIS — R21 Rash and other nonspecific skin eruption: Secondary | ICD-10-CM

## 2017-08-27 DIAGNOSIS — C459 Mesothelioma, unspecified: Secondary | ICD-10-CM | POA: Insufficient documentation

## 2017-08-27 DIAGNOSIS — Z23 Encounter for immunization: Secondary | ICD-10-CM | POA: Diagnosis not present

## 2017-08-27 DIAGNOSIS — Z8673 Personal history of transient ischemic attack (TIA), and cerebral infarction without residual deficits: Secondary | ICD-10-CM | POA: Diagnosis not present

## 2017-08-27 DIAGNOSIS — C801 Malignant (primary) neoplasm, unspecified: Secondary | ICD-10-CM

## 2017-08-27 DIAGNOSIS — I679 Cerebrovascular disease, unspecified: Secondary | ICD-10-CM | POA: Insufficient documentation

## 2017-08-27 DIAGNOSIS — K219 Gastro-esophageal reflux disease without esophagitis: Secondary | ICD-10-CM | POA: Insufficient documentation

## 2017-08-27 DIAGNOSIS — E785 Hyperlipidemia, unspecified: Secondary | ICD-10-CM | POA: Insufficient documentation

## 2017-08-27 DIAGNOSIS — N183 Chronic kidney disease, stage 3 (moderate): Secondary | ICD-10-CM | POA: Insufficient documentation

## 2017-08-27 DIAGNOSIS — E1122 Type 2 diabetes mellitus with diabetic chronic kidney disease: Secondary | ICD-10-CM | POA: Insufficient documentation

## 2017-08-27 LAB — CBC WITH DIFFERENTIAL/PLATELET
BASOS PCT: 1 %
Basophils Absolute: 0 10*3/uL (ref 0–0.1)
Eosinophils Absolute: 0.1 10*3/uL (ref 0–0.7)
Eosinophils Relative: 1 %
HEMATOCRIT: 33.8 % — AB (ref 40.0–52.0)
HEMOGLOBIN: 12 g/dL — AB (ref 13.0–18.0)
LYMPHS ABS: 1.4 10*3/uL (ref 1.0–3.6)
Lymphocytes Relative: 34 %
MCH: 30.7 pg (ref 26.0–34.0)
MCHC: 35.4 g/dL (ref 32.0–36.0)
MCV: 86.7 fL (ref 80.0–100.0)
MONOS PCT: 15 %
Monocytes Absolute: 0.6 10*3/uL (ref 0.2–1.0)
NEUTROS ABS: 2 10*3/uL (ref 1.4–6.5)
NEUTROS PCT: 49 %
Platelets: 384 10*3/uL (ref 150–440)
RBC: 3.89 MIL/uL — AB (ref 4.40–5.90)
RDW: 14.1 % (ref 11.5–14.5)
WBC: 4.2 10*3/uL (ref 3.8–10.6)

## 2017-08-27 LAB — COMPREHENSIVE METABOLIC PANEL
ALBUMIN: 4 g/dL (ref 3.5–5.0)
ALK PHOS: 72 U/L (ref 38–126)
ALT: 42 U/L (ref 17–63)
ANION GAP: 9 (ref 5–15)
AST: 34 U/L (ref 15–41)
BILIRUBIN TOTAL: 0.4 mg/dL (ref 0.3–1.2)
BUN: 26 mg/dL — ABNORMAL HIGH (ref 6–20)
CALCIUM: 9.5 mg/dL (ref 8.9–10.3)
CO2: 26 mmol/L (ref 22–32)
CREATININE: 1.37 mg/dL — AB (ref 0.61–1.24)
Chloride: 103 mmol/L (ref 101–111)
GFR calc Af Amer: 57 mL/min — ABNORMAL LOW (ref >60)
GFR calc non Af Amer: 50 mL/min — ABNORMAL LOW (ref >60)
GLUCOSE: 126 mg/dL — AB (ref 65–99)
Potassium: 4.3 mmol/L (ref 3.5–5.1)
Sodium: 138 mmol/L (ref 135–145)
TOTAL PROTEIN: 7 g/dL (ref 6.5–8.1)

## 2017-08-27 MED ORDER — SODIUM CHLORIDE 0.9 % IV SOLN
1000.0000 mg | Freq: Once | INTRAVENOUS | Status: AC
  Administered 2017-08-27: 1000 mg via INTRAVENOUS
  Filled 2017-08-27: qty 40

## 2017-08-27 MED ORDER — SODIUM CHLORIDE 0.9 % IV SOLN
Freq: Once | INTRAVENOUS | Status: AC
  Administered 2017-08-27: 10:00:00 via INTRAVENOUS
  Filled 2017-08-27: qty 1000

## 2017-08-27 MED ORDER — DEXAMETHASONE SODIUM PHOSPHATE 10 MG/ML IJ SOLN
10.0000 mg | Freq: Once | INTRAMUSCULAR | Status: AC
  Administered 2017-08-27: 10 mg via INTRAVENOUS
  Filled 2017-08-27: qty 1

## 2017-08-27 MED ORDER — PALONOSETRON HCL INJECTION 0.25 MG/5ML
0.2500 mg | Freq: Once | INTRAVENOUS | Status: AC
  Administered 2017-08-27: 0.25 mg via INTRAVENOUS
  Filled 2017-08-27: qty 5

## 2017-08-27 MED ORDER — HEPARIN SOD (PORK) LOCK FLUSH 100 UNIT/ML IV SOLN
INTRAVENOUS | Status: AC
  Filled 2017-08-27: qty 5

## 2017-08-27 MED ORDER — SODIUM CHLORIDE 0.9 % IV SOLN
450.0000 mg | Freq: Once | INTRAVENOUS | Status: AC
  Administered 2017-08-27: 450 mg via INTRAVENOUS
  Filled 2017-08-27: qty 45

## 2017-08-27 NOTE — Progress Notes (Signed)
[facility] NOTE  Patient Care Team: Derinda Late, MD as PCP - General (Family Medicine) Margaretha Sheffield, MD (Otolaryngology) Telford Nab, RN as Registered Nurse Nestor Lewandowsky, MD as Referring Physician (Cardiothoracic Surgery) Cammie Sickle, MD as Consulting Physician (Internal Medicine)  CHIEF COMPLAINTS/PURPOSE OF CONSULTATION: Malignant pleural effusion.   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 PN  # MOLECULAR TESTING- PDL-1- 1% [LOW]      HISTORY OF PRESENTING ILLNESS:  Ian Shaffer 71 y.o.  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   The interim patient was also evaluated at [facility] for second opinion; thought to be a candidate for clinical trial- second line option.  Patient tolerated cycle #1 of Carbo and Alimta fairly well. Admits to mild nausea currently resolved. No vomiting.   Patient had a skin rash for which was started on steroids- currently resolved.  Patient's breathing is normal. No cough. No pain.   ROS: A complete 10 point review of system is done which is negative except mentioned above in history of present illness  MEDICAL HISTORY:  Past Medical History:  Diagnosis Date  . Benign essential hypertension 05/31/2014  . Cancer ([place]) 06/2017   right lung   . Cerebral infarction ([place]) 05/31/2014   . Cerebrovascular disease 05/31/2014  . CKD (chronic kidney disease) stage 3, GFR 30-59 ml/min 02/23/2016  . CVA (cerebral infarction)   . Diabetes mellitus without complication ([place])   . Difficult intubation   . DVT (deep venous thrombosis) ([place])   . Esophageal reflux 05/31/2014  . H/O: CVA (cerebrovascular accident)   . Hyperlipidemia   . Hypertension   . ICAO (internal carotid artery occlusion) March 01, 2014   Left  . 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 Pleural effusion 04/23/2017  . Stroke [facility]) April, 4,2015  . Type 2 diabetes mellitus with peripheral neuropathy ([place]) 02/23/2016  . Weakness     SURGICAL HISTORY: Past Surgical History:  Procedure Laterality Date  . CHEST TUBE INSERTION N/A 06/25/2017   Procedure: MBTDHR CATHETER INSERTION;  Surgeon: Nestor Lewandowsky, MD;  Location: [facility] ORS;  Service: Thoracic;  Laterality: N/A;  . CHEST TUBE INSERTION Right 08/07/2017   Procedure: REMOVAL OF PLEUR X CATHETER;  Surgeon: Nestor Lewandowsky, MD;  Location: [facility] ORS;  Service: General;  Laterality: Right;  . NASAL SINUS SURGERY  2000  . TONSILLECTOMY    . VIDEO ASSISTED THORACOSCOPY Right 06/25/2017   Procedure: VIDEO ASSISTED THORACOSCOPY WITH TALC PLEURODESIS, CHEST TUBE INSERTION;  Surgeon: Nestor Lewandowsky, MD;  Location: [facility] ORS;  Service: Thoracic;  Laterality: Right;    SOCIAL HISTORY: no smoke/ no alcohol ; [place] years [place]; retd [employer]; but  dad had furnace- ? Few years in child hood;Exposure to agent orange in [place]. Has no children.  Social History   Social History  . Marital status: Married    Spouse name: N/A  . Number  of children: N/A  . Years of education: N/A   Occupational History  . Not on file.   Social History Main Topics  . Smoking status: Former Smoker    Types: Pipe    Quit date: 05/07/1974  . Smokeless tobacco: Never Used     Comment: pt states he smoked a pipe a long time ago  .  Alcohol use No  . Drug use: No  . Sexual activity: Not on file   Other Topics Concern  . Not on file   Social History Narrative  . No narrative on file    FAMILY HISTORY: Family History  Problem Relation Age of Onset  . Hypertension Mother   . Hypertension Father     ALLERGIES:  is allergic to sulfa antibiotics; adhesive [tape]; amoxicillin; and other.  MEDICATIONS:  Current Outpatient Prescriptions  Medication Sig Dispense Refill  . amLODipine (NORVASC) 5 MG tablet Take 5 mg by mouth daily.    Marland Kitchen aspirin EC 81 MG tablet Take 81 mg by mouth daily.    . clopidogrel (PLAVIX) 75 MG tablet Take 75 mg by mouth daily.    Marland Kitchen dexamethasone (DECADRON) 4 MG tablet Take one pill AM & PM x 3 days; start the day prior to chemo. 60 tablet 0  . fluticasone (FLONASE) 50 MCG/ACT nasal spray Place 1 spray into both nostrils daily at 2 PM.     . folic acid (FOLVITE) 1 MG tablet Take 1 tablet (1 mg total) by mouth daily. (Patient taking differently: Take 1 mg by mouth daily at 2 PM. ) 90 tablet 1  . Homeopathic Products (NERVE PAIN RELIEF SL) Take 1 tablet by mouth daily. NERVE RENEW (METHYLCOBALAMIN-BENFOTIAMINE-STABILIZED R-ALPHA LIPOIC ACID)    . liraglutide (VICTOZA) 18 MG/3ML SOPN Inject 1.2 mg into the skin daily.    Marland Kitchen lisinopril (PRINIVIL,ZESTRIL) 10 MG tablet Take 10 mg by mouth daily.    . metoprolol succinate (TOPROL-XL) 25 MG 24 hr tablet Take 25 mg by mouth daily.    . Multiple Vitamins-Minerals (CENTRUM SILVER PO) Take 1 tablet by mouth daily.    . pantoprazole (PROTONIX) 40 MG tablet Take 40 mg by mouth daily before breakfast.    . pravastatin (PRAVACHOL) 40 MG tablet Take 40 mg by mouth daily.    Marland Kitchen loratadine (CLARITIN) 10 MG tablet Take 10 mg by mouth daily as needed for allergies or rhinitis.    Marland Kitchen ondansetron (ZOFRAN) 8 MG tablet Take 8 mg by mouth every 8 (eight) hours as needed for nausea or refractory nausea / vomiting.     . polyethylene glycol powder (GLYCOLAX/MIRALAX) powder  Take 17 g by mouth daily as needed (for constipation.).    Marland Kitchen prochlorperazine (COMPAZINE) 10 MG tablet Take 10 mg by mouth every 8 (eight) hours as needed for vomiting.      No current facility-administered medications for this visit.       Marland Kitchen  PHYSICAL EXAMINATION: ECOG PERFORMANCE STATUS: 0 - Asymptomatic  Vitals:   08/27/17 0855  BP: (!) 154/76  Pulse: 73  Resp: 20  Temp: 97.6 F (36.4 C)   Filed Weights   08/27/17 0855  Weight: 201 lb (91.2 kg)    GENERAL: Well-nourished well-developed; Alert, no distress and comfortable.   Accompanied by his wife. EYES: no pallor or icterus OROPHARYNX: no thrush or ulceration; good dentition  NECK: supple, no masses felt LYMPH:  no palpable lymphadenopathy in the cervical, axillary or inguinal regions LUNGS: Decreased breath sounds on the right side  compared to the left. No wheeze or crackles HEART/CVS: regular rate & rhythm and no murmurs; No lower extremity edema ABDOMEN: abdomen soft, non-tender and normal bowel sounds Musculoskeletal:no cyanosis of digits and no clubbing  PSYCH: alert & oriented x 3 with fluent speech NEURO: no focal motor/sensory deficits SKIN:  no rashes or significant lesions  LABORATORY DATA:  I have reviewed the data as listed Lab Results  Component Value Date   WBC 4.2 08/27/2017   HGB 12.0 (L) 08/27/2017   HCT 33.8 (L) 08/27/2017   MCV 86.7 08/27/2017   PLT 384 08/27/2017    Recent Labs  07/08/17 1236 08/04/17 1420 08/13/17 1003 08/27/17 0838  NA 137 138 133* 138  K 4.6 3.9 4.7 4.3  CL 102 105 98* 103  CO2 27 24 27 26   GLUCOSE 125* 187* 154* 126*  BUN 26* 25* 29* 26*  CREATININE 1.34* 1.32* 1.38* 1.37*  CALCIUM 9.0 9.2 9.3 9.5  GFRNONAA 51* 52* 49* 50*  GFRAA 59* >60 57* 57*  PROT 7.1  --  6.9 7.0  ALBUMIN 3.6  --  3.8 4.0  AST 20  --  30 34  ALT 25  --  29 42  ALKPHOS 92  --  81 72  BILITOT 0.4  --  0.7 0.4    RADIOGRAPHIC STUDIES: I have personally reviewed the radiological  images as listed and agreed with the findings in the report. No results found.  ASSESSMENT & PLAN:   [name], malignant ([place]) # Lik[place] mesothelioma- [approximately 10 cm mass- extending above and below the diaphragm on the right side]. Foundation 1/PDL 1 testing pending. On carbo-Alimta s/p cycle #1. No evidence of clinical progression; tolerated chemotherapy well.  # [place]-alimta proceed with cycle #2 today. Labs today reviewed;  acceptable for treatment today.   # Skin rash- ? Sec to Alimta. Recommend steroids x 4 days post chemo [until 10/07].   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 Stable.   # discussed re: Blood sugars checking on dex;   # follow up in 10 days; labs; flu shot; and then in 3 weeks/chemo/labs; CT scan few days prior.   All questions were answered. The patient knows to call the clinic with any problems, questions or concerns.    Cammie Sickle, MD 08/27/2017 12:18 PM

## 2017-08-27 NOTE — Progress Notes (Signed)
Patient presents to clinic today for follow-up. Rash has resolved since last visit. He has no medical compalints.

## 2017-08-27 NOTE — Assessment & Plan Note (Deleted)
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 Foundation 1/PDL 1 testing pending. On carbo-Alimta s/p cycle #1. No evidence of clinical progression; tolerated chemotherapy well.  # Cabo-alimta proceed with cycle #2 today. Labs today reviewed;  acceptable for treatment today.   # Skin rash- ? Sec to Alimta. Recommend steroids x 4 days post chemo [until 10/07].   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 Stable.   # discussed re: Blood sugars checking on dex;   # follow up in 10 days; labs; flu shot; and then in 3 weeks/chemo/labs; CT scan few days prior.

## 2017-08-27 NOTE — Assessment & Plan Note (Signed)
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 Foundation 1/PDL 1 testing pending. On carbo-Alimta s/p cycle #1. No evidence of clinical progression; tolerated chemotherapy well.  # Cabo-alimta proceed with cycle #2 today. Labs today reviewed;  acceptable for treatment today.   # Skin rash- ? Sec to Alimta. Recommend steroids x 4 days post chemo [until 10/07].   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 Stable.   # discussed re: Blood sugars checking on dex;   # follow up in 10 days; labs; flu shot; and then in 3 weeks/chemo/labs; CT scan few days prior.

## 2017-09-05 ENCOUNTER — Inpatient Hospital Stay: Payer: Medicare Other

## 2017-09-05 DIAGNOSIS — Z23 Encounter for immunization: Secondary | ICD-10-CM

## 2017-09-05 DIAGNOSIS — C801 Malignant (primary) neoplasm, unspecified: Secondary | ICD-10-CM

## 2017-09-05 DIAGNOSIS — Z5111 Encounter for antineoplastic chemotherapy: Secondary | ICD-10-CM | POA: Diagnosis not present

## 2017-09-05 LAB — COMPREHENSIVE METABOLIC PANEL
ALK PHOS: 72 U/L (ref 38–126)
ALT: 57 U/L (ref 17–63)
ANION GAP: 7 (ref 5–15)
AST: 39 U/L (ref 15–41)
Albumin: 3.9 g/dL (ref 3.5–5.0)
BUN: 32 mg/dL — ABNORMAL HIGH (ref 6–20)
CALCIUM: 9.2 mg/dL (ref 8.9–10.3)
CHLORIDE: 102 mmol/L (ref 101–111)
CO2: 28 mmol/L (ref 22–32)
Creatinine, Ser: 1.35 mg/dL — ABNORMAL HIGH (ref 0.61–1.24)
GFR, EST AFRICAN AMERICAN: 59 mL/min — AB (ref >60)
GFR, EST NON AFRICAN AMERICAN: 50 mL/min — AB (ref >60)
Glucose, Bld: 124 mg/dL — ABNORMAL HIGH (ref 65–99)
Potassium: 4.9 mmol/L (ref 3.5–5.1)
SODIUM: 137 mmol/L (ref 135–145)
Total Bilirubin: 0.7 mg/dL (ref 0.3–1.2)
Total Protein: 6.6 g/dL (ref 6.5–8.1)

## 2017-09-05 LAB — CBC WITH DIFFERENTIAL/PLATELET
BASOS ABS: 0 10*3/uL (ref 0–0.1)
BASOS PCT: 1 %
EOS PCT: 1 %
Eosinophils Absolute: 0 10*3/uL (ref 0–0.7)
HCT: 32.8 % — ABNORMAL LOW (ref 40.0–52.0)
Hemoglobin: 11.4 g/dL — ABNORMAL LOW (ref 13.0–18.0)
LYMPHS PCT: 38 %
Lymphs Abs: 0.9 10*3/uL — ABNORMAL LOW (ref 1.0–3.6)
MCH: 30.4 pg (ref 26.0–34.0)
MCHC: 34.7 g/dL (ref 32.0–36.0)
MCV: 87.7 fL (ref 80.0–100.0)
MONO ABS: 0.3 10*3/uL (ref 0.2–1.0)
Monocytes Relative: 15 %
Neutro Abs: 1 10*3/uL — ABNORMAL LOW (ref 1.4–6.5)
Neutrophils Relative %: 45 %
PLATELETS: 126 10*3/uL — AB (ref 150–440)
RBC: 3.74 MIL/uL — ABNORMAL LOW (ref 4.40–5.90)
RDW: 14.7 % — AB (ref 11.5–14.5)
WBC: 2.3 10*3/uL — ABNORMAL LOW (ref 3.8–10.6)

## 2017-09-05 MED ORDER — INFLUENZA VAC SPLIT QUAD 0.5 ML IM SUSY
0.5000 mL | PREFILLED_SYRINGE | Freq: Once | INTRAMUSCULAR | Status: AC
  Administered 2017-09-05: 0.5 mL via INTRAMUSCULAR

## 2017-09-08 ENCOUNTER — Emergency Department: Payer: Medicare Other

## 2017-09-08 ENCOUNTER — Observation Stay
Admission: EM | Admit: 2017-09-08 | Discharge: 2017-09-09 | Disposition: A | Discharge status: Home / Self Care | Payer: Medicare Other | Attending: Internal Medicine | Admitting: Internal Medicine

## 2017-09-08 DIAGNOSIS — R8271 Bacteriuria: Secondary | ICD-10-CM

## 2017-09-08 DIAGNOSIS — Z8673 Personal history of transient ischemic attack (TIA), and cerebral infarction without residual deficits: Secondary | ICD-10-CM | POA: Diagnosis not present

## 2017-09-08 DIAGNOSIS — N183 Chronic kidney disease, stage 3 (moderate): Secondary | ICD-10-CM | POA: Insufficient documentation

## 2017-09-08 DIAGNOSIS — R509 Fever, unspecified: Secondary | ICD-10-CM | POA: Diagnosis present

## 2017-09-08 DIAGNOSIS — I129 Hypertensive chronic kidney disease with stage 1 through stage 4 chronic kidney disease, or unspecified chronic kidney disease: Secondary | ICD-10-CM | POA: Diagnosis not present

## 2017-09-08 DIAGNOSIS — Z880 Allergy status to penicillin: Secondary | ICD-10-CM | POA: Insufficient documentation

## 2017-09-08 DIAGNOSIS — Z7982 Long term (current) use of aspirin: Secondary | ICD-10-CM | POA: Insufficient documentation

## 2017-09-08 DIAGNOSIS — J90 Pleural effusion, not elsewhere classified: Secondary | ICD-10-CM | POA: Diagnosis not present

## 2017-09-08 DIAGNOSIS — D72819 Decreased white blood cell count, unspecified: Secondary | ICD-10-CM | POA: Insufficient documentation

## 2017-09-08 DIAGNOSIS — K219 Gastro-esophageal reflux disease without esophagitis: Secondary | ICD-10-CM | POA: Insufficient documentation

## 2017-09-08 DIAGNOSIS — E785 Hyperlipidemia, unspecified: Secondary | ICD-10-CM | POA: Diagnosis not present

## 2017-09-08 DIAGNOSIS — Z87891 Personal history of nicotine dependence: Secondary | ICD-10-CM | POA: Diagnosis not present

## 2017-09-08 DIAGNOSIS — E1122 Type 2 diabetes mellitus with diabetic chronic kidney disease: Secondary | ICD-10-CM | POA: Insufficient documentation

## 2017-09-08 DIAGNOSIS — E1142 Type 2 diabetes mellitus with diabetic polyneuropathy: Secondary | ICD-10-CM | POA: Insufficient documentation

## 2017-09-08 DIAGNOSIS — Z79899 Other long term (current) drug therapy: Secondary | ICD-10-CM | POA: Diagnosis not present

## 2017-09-08 LAB — CBC WITH DIFFERENTIAL/PLATELET
BASOS ABS: 0 10*3/uL (ref 0–0.1)
Basophils Relative: 0 %
Eosinophils Absolute: 0 10*3/uL (ref 0–0.7)
Eosinophils Relative: 1 %
HEMATOCRIT: 31.1 % — AB (ref 40.0–52.0)
Hemoglobin: 10.8 g/dL — ABNORMAL LOW (ref 13.0–18.0)
LYMPHS ABS: 0.7 10*3/uL — AB (ref 1.0–3.6)
Lymphocytes Relative: 21 %
MCH: 30.2 pg (ref 26.0–34.0)
MCHC: 34.6 g/dL (ref 32.0–36.0)
MCV: 87.1 fL (ref 80.0–100.0)
Monocytes Absolute: 0.5 10*3/uL (ref 0.2–1.0)
Monocytes Relative: 14 %
NEUTROS ABS: 2.1 10*3/uL (ref 1.4–6.5)
NEUTROS PCT: 64 %
Platelets: 74 10*3/uL — ABNORMAL LOW (ref 150–440)
RBC: 3.57 MIL/uL — AB (ref 4.40–5.90)
RDW: 14.2 % (ref 11.5–14.5)
WBC: 3.3 10*3/uL — AB (ref 3.8–10.6)

## 2017-09-08 LAB — BASIC METABOLIC PANEL
ANION GAP: 7 (ref 5–15)
BUN: 30 mg/dL — ABNORMAL HIGH (ref 6–20)
CHLORIDE: 107 mmol/L (ref 101–111)
CO2: 24 mmol/L (ref 22–32)
Calcium: 9 mg/dL (ref 8.9–10.3)
Creatinine, Ser: 1.47 mg/dL — ABNORMAL HIGH (ref 0.61–1.24)
GFR calc Af Amer: 53 mL/min — ABNORMAL LOW (ref >60)
GFR calc non Af Amer: 46 mL/min — ABNORMAL LOW (ref >60)
GLUCOSE: 138 mg/dL — AB (ref 65–99)
POTASSIUM: 4.2 mmol/L (ref 3.5–5.1)
Sodium: 138 mmol/L (ref 135–145)

## 2017-09-08 LAB — URINALYSIS, COMPLETE (UACMP) WITH MICROSCOPIC
Bilirubin Urine: NEGATIVE
GLUCOSE, UA: NEGATIVE mg/dL
Hgb urine dipstick: NEGATIVE
KETONES UR: NEGATIVE mg/dL
LEUKOCYTES UA: NEGATIVE
Nitrite: NEGATIVE
PROTEIN: 30 mg/dL — AB
Specific Gravity, Urine: 1.018 (ref 1.005–1.030)
pH: 5 (ref 5.0–8.0)

## 2017-09-08 MED ORDER — PIPERACILLIN-TAZOBACTAM 3.375 G IVPB 30 MIN
INTRAVENOUS | Status: AC
  Filled 2017-09-08: qty 50

## 2017-09-08 MED ORDER — ACETAMINOPHEN 500 MG PO TABS
1000.0000 mg | ORAL_TABLET | Freq: Once | ORAL | Status: AC
  Administered 2017-09-08: 1000 mg via ORAL
  Filled 2017-09-08: qty 2

## 2017-09-08 MED ORDER — VANCOMYCIN HCL 10 G IV SOLR
1750.0000 mg | Freq: Once | INTRAVENOUS | Status: AC
  Administered 2017-09-08: 1750 mg via INTRAVENOUS
  Filled 2017-09-08: qty 1750

## 2017-09-08 MED ORDER — PIPERACILLIN-TAZOBACTAM 3.375 G IVPB 30 MIN
3.3750 g | Freq: Once | INTRAVENOUS | Status: AC
  Administered 2017-09-08: 3.375 g via INTRAVENOUS

## 2017-09-08 NOTE — ED Triage Notes (Signed)
Reports fever and chills that started today.  Patient is cancer patient receiving treatment (last approximately 1 week ago.)  Reports called cancer center and instructed to come to ED.

## 2017-09-08 NOTE — ED Notes (Signed)
Pt states that he is running a fever and he was told by cancer center to come to ED. They also said that his WBC count was low.

## 2017-09-08 NOTE — ED Provider Notes (Signed)
[facility] Emergency Department Provider Note  ____________________________________________  Time seen: Approximately 10:04 PM  I have reviewed the triage vital signs and the nursing notes.   HISTORY  Chief Complaint Fever    HPI Ian Shaffer is a 71 y.o. male currently undergoing chemotherapy treatment for mesothelioma presenting with fever. The patient has been feeling "totally fine." Today, however, he felt warm and took his temperature which was 101.0 home. It was 101.2 in the emergency department. He denies any cough or cold symptoms, nausea vomiting or diarrhea, or urinary symptoms. He does not have a port in place. At this time, the patient is completely asymptomatic. Last chemotherapy was 11 days ago.the patient did receive a flu vaccination 4 days ago.   Past Medical History:  Diagnosis Date  . Benign essential hypertension 05/31/2014  . Cancer ([place]) 06/2017   right lung   . Cerebral infarction ([place]) 05/31/2014  . Cerebrovascular disease 05/31/2014  . CKD (chronic kidney disease) stage 3, GFR 30-59 ml/min 02/23/2016  . CVA (cerebral infarction)   . Diabetes mellitus without complication ([place])   . Difficult intubation   . DVT (deep venous thrombosis) ([place])   . Esophageal reflux 05/31/2014  . H/O: CVA (cerebrovascular accident)   . Hyperlipidemia   . Hypertension   . ICAO (internal carotid artery occlusion) March 01, 2014   Left  . 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 Pleural effusion 04/23/2017  . Stroke [facility]) April, 4,2015  . Type 2 diabetes mellitus with peripheral neuropathy ([place]) 02/23/2016  . Weakness     Patient Active Problem List   Diagnosis Date Noted  . [name], malignant ([place]) 08/27/2017  . Lung mass 06/25/2017  . 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 Pleural effusion 04/23/2017  . CKD (chronic kidney disease) stage 3, GFR 30-59 ml/min ([facility]) 02/23/2016  . Type 2  diabetes mellitus with peripheral neuropathy ([place]) 02/23/2016  . Cerebrovascular disease 05/31/2014  . Cerebral infarction ([place]) 05/31/2014  . Esophageal reflux 05/31/2014  . Benign essential hypertension 05/31/2014  . Other and unspecified hyperlipidemia 05/31/2014  . Type II or unspecified type diabetes mellitus without mention of complication, not stated as uncontrolled 05/31/2014    Past Surgical History:  Procedure Laterality Date  . CHEST TUBE INSERTION N/A 06/25/2017   Procedure: FAOZHY CATHETER INSERTION;  Surgeon: Nestor Lewandowsky, MD;  Location: [facility] ORS;  Service: Thoracic;  Laterality: N/A;  . CHEST TUBE INSERTION Right 08/07/2017   Procedure: REMOVAL OF PLEUR X CATHETER;  Surgeon: Nestor Lewandowsky, MD;  Location: [facility] ORS;  Service: General;  Laterality: Right;  . NASAL SINUS SURGERY  2000  . TONSILLECTOMY    . VIDEO ASSISTED THORACOSCOPY Right 06/25/2017   Procedure: VIDEO ASSISTED THORACOSCOPY WITH TALC PLEURODESIS, CHEST TUBE INSERTION;  Surgeon: Nestor Lewandowsky, MD;  Location: [facility] ORS;  Service: Thoracic;  Laterality: Right;    Current Outpatient Rx  . Order #: [id number] Class: Historical Med  . Order #: [id number] Class: Historical Med  . Order #: [id number] Class: Historical Med  . Order #: [id number] Class: Normal  . Order #: [id number] Class: Historical Med  . Order #: [id number] Class: Normal  . Order #: [id number] Class: Historical Med  . Order #: [id number] Class: Historical Med  . Order #: [id number] Class: Historical Med  . Order #: [id number] Class: Historical Med  . Order #: [id number] Class: Historical Med  . Order #: [id number] Class: Historical Med  . Order #: [id number] Class: Historical Med  . Order #: [id number] Class: Historical Med  . Order #: [id number] Class:  Historical Med  . Order #: [id number] Class: Historical Med  . Order #: [id number] Class: Historical Med    Allergies Sulfa antibiotics; Adhesive [tape]; Amoxicillin; and Other  Family History  Problem Relation Age of Onset   . Hypertension Mother   . Hypertension Father     Social History Social History  Substance Use Topics  . Smoking status: Former Smoker    Types: Pipe    Quit date: 05/07/1974  . Smokeless tobacco: Never Used     Comment: pt states he smoked a pipe a long time ago  . Alcohol use No    Review of Systems Constitutional: positive fever. No chills.o general malaise. No myalgias. Eyes: No visual changes.no eye discharge for ENT: No sore throat. No congestion or rhinorrhea. Cardiovascular: Denies chest pain. Denies palpitations. Respiratory: Denies shortness of breath.  No cough. Gastrointestinal: No abdominal pain.  No nausea, no vomiting.  No diarrhea.  No constipation. Genitourinary: Negative for dysuria. Musculoskeletal: Negative for back pain. Skin: Negative for rash. Neurological: Negative for headaches. No focal numbness, tingling or weakness.     ____________________________________________   PHYSICAL EXAM:  VITAL SIGNS: ED Triage Vitals [09/08/17 2157]  Enc Vitals Group     BP 140/75     Pulse Rate 87     Resp 20     Temp (!) 101.2 F (38.4 C)     Temp Source Oral     SpO2 97 %     Weight      Height      Head Circumference      Peak Flow      Pain Score      Pain Loc      Pain Edu?      Excl. in [place]?     Constitutional: Alert and oriented. Well appearing and in no acute distress. Answers questions appropriately. Eyes: Conjunctivae are normal.  EOMI. No scleral icterus. Head: Atraumatic. Nose: No congestion/rhinnorhea. Mouth/Throat: Mucous membranes are moist.  Neck: No stridor.  Supple.  No JVD. No meningismus. Cardiovascular: Normal rate, regular rhythm. No murmurs, rubs or gallops.  Respiratory: Normal respiratory effort.  No accessory muscle use or retractions. Lungs CTAB.  No wheezes, rales or ronchi. Gastrointestinal: Soft, nontender and nondistended.  No guarding or rebound.  No peritoneal signs. Musculoskeletal: No LE edema. No ttp in the  calves or palpable cords.  Negative Homan's sign. Neurologic:  A&Ox3.  Speech is clear.  Face and smile are symmetric.  EOMI.  Moves all extremities well. Skin:  Skin is warm, dry and intact. No rash noted. Psychiatric: Mood and affect are normal. Speech and behavior are normal.  Normal judgement.  ____________________________________________   LABS (all labs ordered are listed, but only abnormal results are displayed)  Labs Reviewed  BASIC METABOLIC PANEL - Abnormal; Notable for the following:       Result Value   Glucose, Bld 138 (*)    BUN 30 (*)    Creatinine, Ser 1.47 (*)    GFR calc non Af Amer 46 (*)    GFR calc Af Amer 53 (*)    All other components within normal limits  URINALYSIS, COMPLETE (UACMP) WITH MICROSCOPIC - Abnormal; Notable for the following:    Color, Urine YELLOW (*)    APPearance HAZY (*)    Protein, ur 30 (*)    Bacteria, UA RARE (*)    Squamous Epithelial / LPF 0-5 (*)    All other components within normal limits  URINE CULTURE  CULTURE, BLOOD (ROUTINE X 2)  CULTURE, BLOOD (ROUTINE X 2)  CBC WITH DIFFERENTIAL/PLATELET   ____________________________________________  EKG  Not indicated ____________________________________________  RADIOLOGY  Dg Chest 2 View  Result Date: 09/08/2017 CLINICAL DATA:  Fever and chills today.  Lung cancer patient. EXAM: CHEST  2 VIEW COMPARISON:  07/04/2017 FINDINGS: Normal heart size. No aortic aneurysm. Pleural place soft tissue density best seen on the lateral view is again seen posteriorly. Interval decrease of fluid from the right major fissure since prior exam. Removal of right-sided chest tube is noted. No pneumonic consolidation, pneumothorax or CHF. Minimal blunting right costophrenic angle as before. IMPRESSION: 1. Interval decrease in right-sided pleural fluid from the major fissure. Interval removal of right-sided chest tube. 2. No acute pneumonic consolidation. 3. Stable masslike opacity in the posterior  costophrenic angle. Electronically Signed   By: Ashley Royalty M.D.   On: 09/08/2017 23:03    ____________________________________________   PROCEDURES  Procedure(s) performed: None  Procedures  Critical Care performed: No ____________________________________________   INITIAL IMPRESSION / ASSESSMENT AND PLAN / ED COURSE  Pertinent labs & imaging results that were available during my care of the patient were reviewed by me and considered in my medical decision making (see chart for details).  71 y.o. male on chemotherapy for mesothelioma presenting with fever without a clear source given his lack of other symptoms. Here, the patient is febrile to 101.2 and has been given an antipyretic. At this time, the patient's urinalysis shows rare bacteria without any leukocytes or nitrites.history shows an interval decrease in a right-sided pleural fluid and a stable mass.  At this time, the patient will be admitted for empiric antibiotics.  ____________________________________________  FINAL CLINICAL IMPRESSION(S) / ED DIAGNOSES  Final diagnoses:  Fever, unspecified fever cause  Bacteriuria  Pleural effusion, right         NEW MEDICATIONS STARTED DURING THIS VISIT:  New Prescriptions   No medications on file      Eula Listen, MD 09/08/17 2321

## 2017-09-09 ENCOUNTER — Telehealth: Payer: Self-pay | Admitting: *Deleted

## 2017-09-09 ENCOUNTER — Telehealth: Payer: Self-pay | Admitting: Internal Medicine

## 2017-09-09 DIAGNOSIS — J90 Pleural effusion, not elsewhere classified: Secondary | ICD-10-CM | POA: Diagnosis not present

## 2017-09-09 LAB — GLUCOSE, CAPILLARY
GLUCOSE-CAPILLARY: 122 mg/dL — AB (ref 65–99)
Glucose-Capillary: 107 mg/dL — ABNORMAL HIGH (ref 65–99)

## 2017-09-09 LAB — CBC
HEMATOCRIT: 27.7 % — AB (ref 40.0–52.0)
Hemoglobin: 9.4 g/dL — ABNORMAL LOW (ref 13.0–18.0)
MCH: 29.4 pg (ref 26.0–34.0)
MCHC: 33.9 g/dL (ref 32.0–36.0)
MCV: 86.6 fL (ref 80.0–100.0)
PLATELETS: 63 10*3/uL — AB (ref 150–440)
RBC: 3.2 MIL/uL — AB (ref 4.40–5.90)
RDW: 14.3 % (ref 11.5–14.5)
WBC: 2.8 10*3/uL — ABNORMAL LOW (ref 3.8–10.6)

## 2017-09-09 LAB — MRSA PCR SCREENING: MRSA BY PCR: NEGATIVE

## 2017-09-09 LAB — BASIC METABOLIC PANEL
Anion gap: 6 (ref 5–15)
BUN: 30 mg/dL — AB (ref 6–20)
CO2: 24 mmol/L (ref 22–32)
CREATININE: 1.54 mg/dL — AB (ref 0.61–1.24)
Calcium: 8.5 mg/dL — ABNORMAL LOW (ref 8.9–10.3)
Chloride: 109 mmol/L (ref 101–111)
GFR calc non Af Amer: 43 mL/min — ABNORMAL LOW (ref >60)
GFR, EST AFRICAN AMERICAN: 50 mL/min — AB (ref >60)
Glucose, Bld: 148 mg/dL — ABNORMAL HIGH (ref 65–99)
Potassium: 3.5 mmol/L (ref 3.5–5.1)
SODIUM: 139 mmol/L (ref 135–145)

## 2017-09-09 MED ORDER — INSULIN ASPART 100 UNIT/ML ~~LOC~~ SOLN: 0.0000 [IU] | Freq: Every day | SUBCUTANEOUS | Status: DC

## 2017-09-09 MED ORDER — ACETAMINOPHEN 325 MG PO TABS: 650.0000 mg | ORAL_TABLET | Freq: Four times a day (QID) | ORAL | Status: DC | PRN

## 2017-09-09 MED ORDER — ACETAMINOPHEN 650 MG RE SUPP: 650.0000 mg | Freq: Four times a day (QID) | RECTAL | Status: DC | PRN

## 2017-09-09 MED ORDER — LISINOPRIL 10 MG PO TABS
10.0000 mg | ORAL_TABLET | Freq: Every day | ORAL | Status: DC
  Administered 2017-09-09: 08:00:00 10 mg via ORAL
  Filled 2017-09-09: qty 1

## 2017-09-09 MED ORDER — AMLODIPINE BESYLATE 5 MG PO TABS
5.0000 mg | ORAL_TABLET | Freq: Every day | ORAL | Status: DC
  Administered 2017-09-09: 5 mg via ORAL
  Filled 2017-09-09: qty 1

## 2017-09-09 MED ORDER — ASPIRIN EC 81 MG PO TBEC
81.0000 mg | DELAYED_RELEASE_TABLET | Freq: Every day | ORAL | Status: DC
  Administered 2017-09-09: 08:00:00 81 mg via ORAL
  Filled 2017-09-09: qty 1

## 2017-09-09 MED ORDER — INSULIN ASPART 100 UNIT/ML ~~LOC~~ SOLN: 0.0000 [IU] | Freq: Three times a day (TID) | SUBCUTANEOUS | Status: DC

## 2017-09-09 MED ORDER — ONDANSETRON HCL 4 MG PO TABS: 4.0000 mg | ORAL_TABLET | Freq: Four times a day (QID) | ORAL | Status: DC | PRN

## 2017-09-09 MED ORDER — SENNOSIDES-DOCUSATE SODIUM 8.6-50 MG PO TABS: 1.0000 | ORAL_TABLET | Freq: Every evening | ORAL | Status: DC | PRN

## 2017-09-09 MED ORDER — LEVOFLOXACIN 500 MG PO TABS
500.0000 mg | ORAL_TABLET | Freq: Every day | ORAL | Status: DC
  Administered 2017-09-09: 10:00:00 500 mg via ORAL
  Filled 2017-09-09: qty 1

## 2017-09-09 MED ORDER — PIPERACILLIN-TAZOBACTAM 3.375 G IVPB: 3.3750 g | Freq: Three times a day (TID) | INTRAVENOUS | Status: DC

## 2017-09-09 MED ORDER — ONDANSETRON HCL 4 MG/2ML IJ SOLN: 4.0000 mg | Freq: Four times a day (QID) | INTRAMUSCULAR | Status: DC | PRN

## 2017-09-09 MED ORDER — ENOXAPARIN SODIUM 40 MG/0.4ML ~~LOC~~ SOLN: 40.0000 mg | SUBCUTANEOUS | Status: DC

## 2017-09-09 MED ORDER — BISACODYL 5 MG PO TBEC: 5.0000 mg | DELAYED_RELEASE_TABLET | Freq: Every day | ORAL | Status: DC | PRN

## 2017-09-09 MED ORDER — METOPROLOL SUCCINATE ER 25 MG PO TB24
25.0000 mg | ORAL_TABLET | Freq: Every day | ORAL | Status: DC
  Administered 2017-09-09: 08:00:00 25 mg via ORAL
  Filled 2017-09-09: qty 1

## 2017-09-09 MED ORDER — PRAVASTATIN SODIUM 20 MG PO TABS
40.0000 mg | ORAL_TABLET | Freq: Every day | ORAL | Status: DC
  Administered 2017-09-09: 08:00:00 40 mg via ORAL
  Filled 2017-09-09: qty 2

## 2017-09-09 MED ORDER — LEVOFLOXACIN 250 MG PO TABS: 250.0000 mg | ORAL_TABLET | Freq: Every day | ORAL | 0 refills | Status: DC

## 2017-09-09 MED ORDER — POLYETHYLENE GLYCOL 3350 17 GM/SCOOP PO POWD: 17.0000 g | Freq: Every day | ORAL | Status: DC | PRN

## 2017-09-09 MED ORDER — KETOROLAC TROMETHAMINE 15 MG/ML IJ SOLN
15.0000 mg | Freq: Four times a day (QID) | INTRAMUSCULAR | Status: DC | PRN
  Filled 2017-09-09: qty 1

## 2017-09-09 MED ORDER — PANTOPRAZOLE SODIUM 40 MG PO TBEC
40.0000 mg | DELAYED_RELEASE_TABLET | Freq: Every day | ORAL | Status: DC
  Administered 2017-09-09: 40 mg via ORAL
  Filled 2017-09-09: qty 1

## 2017-09-09 MED ORDER — ALBUTEROL SULFATE (2.5 MG/3ML) 0.083% IN NEBU: 2.5000 mg | INHALATION_SOLUTION | RESPIRATORY_TRACT | Status: DC | PRN

## 2017-09-09 MED ORDER — FLUTICASONE PROPIONATE 50 MCG/ACT NA SUSP
2.0000 | Freq: Every day | NASAL | Status: DC
  Administered 2017-09-09: 2 via NASAL
  Filled 2017-09-09: qty 16

## 2017-09-09 MED ORDER — CLOPIDOGREL BISULFATE 75 MG PO TABS
75.0000 mg | ORAL_TABLET | Freq: Every day | ORAL | Status: DC
  Administered 2017-09-09: 08:00:00 75 mg via ORAL
  Filled 2017-09-09: qty 1

## 2017-09-09 MED ORDER — FLUTICASONE PROPIONATE 50 MCG/ACT NA SUSP
1.0000 | Freq: Every day | NASAL | Status: DC
  Filled 2017-09-09: qty 16

## 2017-09-09 MED ORDER — POLYETHYLENE GLYCOL 3350 17 G PO PACK: 17.0000 g | PACK | Freq: Every day | ORAL | Status: DC | PRN

## 2017-09-09 MED ORDER — PROCHLORPERAZINE MALEATE 10 MG PO TABS
10.0000 mg | ORAL_TABLET | Freq: Three times a day (TID) | ORAL | Status: DC | PRN
  Filled 2017-09-09: qty 1

## 2017-09-09 MED ORDER — SODIUM CHLORIDE 0.9 % IV SOLN
INTRAVENOUS | Status: DC
  Administered 2017-09-09: 05:00:00 via INTRAVENOUS

## 2017-09-09 MED ORDER — HYDROCODONE-ACETAMINOPHEN 5-325 MG PO TABS: 1.0000 | ORAL_TABLET | ORAL | Status: DC | PRN

## 2017-09-09 MED ORDER — FOLIC ACID 1 MG PO TABS: 1.0000 mg | ORAL_TABLET | Freq: Every day | ORAL | Status: DC

## 2017-09-09 MED ORDER — LORATADINE 10 MG PO TABS: 10.0000 mg | ORAL_TABLET | Freq: Every day | ORAL | Status: DC | PRN

## 2017-09-09 NOTE — Discharge Instructions (Signed)
Fever, Adult A fever is an increase in the body's temperature. It is often defined as a temperature of 100 F (38C) or higher. Short mild or moderate fevers often have no long-term effects. They also often do not need treatment. Moderate or high fevers may make you feel uncomfortable. Sometimes, they can also be a sign of a serious illness or disease. The sweating that may happen with repeated fevers or fevers that last a while may also cause you to not have enough fluid in your body (dehydration). You can take your temperature with a thermometer to see if you have a fever. A measured temperature can change with:  Age.  Time of day.  Where the thermometer is placed: ? Mouth (oral). ? Rectum (rectal). ? Ear (tympanic). ? Underarm (axillary). ? Forehead (temporal).  Follow these instructions at home: Pay attention to any changes in your symptoms. Take these actions to help with your condition:  Take over-the-counter and prescription medicines only as told by your doctor. Follow the dosing instructions carefully.  If you were prescribed an antibiotic medicine, take it as told by your doctor. Do not stop taking the antibiotic even if you start to feel better.  Rest as needed.  Drink enough fluid to keep your pee (urine) clear or pale yellow.  Sponge yourself or bathe with room-temperature water as needed. This helps to lower your body temperature . Do not use ice water.  Do not wear too many blankets or heavy clothes.  Contact a doctor if:  You throw up (vomit).  You cannot eat or drink without throwing up.  You have watery poop (diarrhea).  It hurts when you pee.  Your symptoms do not get better with treatment.  You have new symptoms.  You feel very weak. Get help right away if:  You are short of breath or have trouble breathing.  You are dizzy or you pass out (faint).  You feel confused.  You have signs of not having enough fluid in your body, such as: ? A dry  mouth. ? Peeing less. ? Looking pale.  You have very bad pain in your belly (abdomen).  You keep throwing up or having water poop.  You have a skin rash.  Your symptoms suddenly get worse. This information is not intended to replace advice given to you by your health care provider. Make sure you discuss any questions you have with your health care provider. Document Released: 08/20/2008 Document Revised: 04/18/2016 Document Reviewed: 01/05/2015 Elsevier Interactive Patient Education  2018 Elsevier Inc.  

## 2017-09-09 NOTE — Telephone Encounter (Signed)
Pt left message that has a fever of 100.8 last night and will call the physician on-call. Pt currently admitted in room 113.   FYI.

## 2017-09-09 NOTE — ED Notes (Signed)
Pt transported to room 113 

## 2017-09-09 NOTE — Progress Notes (Signed)
Pharmacy Antibiotic Note  Ian Shaffer is a 71 y.o. male admitted on 09/08/2017 with fever.  Pharmacy has been consulted for Zosyn dosing.  Plan: Zosyn 3.375g IV q8h (4 hour infusion).  Height: 5\' 10"  (177.8 cm) Weight: 200 lb 12.8 oz (91.1 kg) IBW/kg (Calculated) : 73  Temp (24hrs), Avg:99.5 F (37.5 C), Min:98.5 F (36.9 C), Max:101.2 F (38.4 C)   Recent Labs Lab 09/05/17 0931 09/08/17 2204  WBC 2.3* 3.3*  CREATININE 1.35* 1.47*    Estimated Creatinine Clearance: 50.8 mL/min (A) (by C-G formula based on SCr of 1.47 mg/dL (H)).    Allergies  Allergen Reactions  . Sulfa Antibiotics Swelling    Facial swelling No tongue or lips swelling, no difficulty breathing.  . Adhesive [Tape] Itching and Rash  . Amoxicillin Nausea Only    Has patient had a PCN reaction causing immediate rash, facial/tongue/throat swelling, SOB or lightheadedness with hypotension: No Has patient had a PCN reaction causing severe rash involving mucus membranes or skin necrosis: No Has patient had a PCN reaction that required hospitalization: No Has patient had a PCN reaction occurring within the last 10 years: Yes If all of the above answers are "NO", then may proceed with Cephalosporin use.   . Other Rash    Surgical tape    Antimicrobials this admission: Zosyn 10/15 >>    >>   Dose adjustments this admission:   Microbiology results: 10/15 BCx: pending 10/15 UCx: pending  10/16 MRSA PCR: (-)     10/15 UA: (-) 10/15 CXR: No acute pneumonic consolidation  Thank you for allowing pharmacy to be a part of this patient's care.  Benjamin Casanas S 09/09/2017 3:04 AM

## 2017-09-09 NOTE — Plan of Care (Signed)
Problem: Education: Goal: Knowledge of [facility] Education information/materials will improve Outcome: Progressing Afebrile, VSS, free of falls during shift.  Denies pain, no needs since arriving to unit.  Bed in low position, call bell within reach.  WCTM.

## 2017-09-09 NOTE — Telephone Encounter (Signed)
Spoke to hospitalist Dr.Weiting; WBC- 2.3; no diff. If pt stable- Okay to be discharged on levaquin x7 days; follow up with me in clinic on 24th as scheduled; with CT as ordered prior.

## 2017-09-09 NOTE — Care Management Note (Signed)
Case Management Note  Patient Details  Name: Lonzell Dorris MRN: [id number] Date of Birth: September 29, 1946  Subjective/Objective:   Admitted to [facility] under observation status with the diagnosis of fever. Lives with wife, Vaughan Basta [phone]). Last seen Dr, Loney Hering 3 weeks ago. Prescriptions are filled at [facility] in the past (06/27/17) for Pleux catheter care. Catheter was pulled in September. No skilled facility. No home oxygen. Glucometer in the home. Takes care of all basic activities of daily living himself, drives. No falls. Good appetite.  Discharge to home today per Dr. Leslye Peer. Wife will transport.                 Action/Plan: No follow-up discharge needs identified   Expected Discharge Date:  09/09/17               Expected Discharge Plan:     In-House Referral:     Discharge planning Services     Post Acute Care Choice:    Choice offered to:     DME Arranged:    DME Agency:     HH Arranged:    HH Agency:     Status of Service:     If discussed at [employer], dates discussed:    Additional Comments:  Shelbie Ammons, [place] Management [phone] 09/09/2017, 9:26 AM

## 2017-09-09 NOTE — Care Management Obs Status (Signed)
[place] NOTIFICATION   Patient Details  Name: [place] Grosser MRN: [id number] Date of Birth: [birth year]   Medicare Observation Status Notification Given:  Yes    Shelbie Ammons, RN 09/09/2017, 9:26 AM

## 2017-09-09 NOTE — Discharge Summary (Signed)
[facility] NAME: Ian Shaffer    MR#:  [id number]  DATE OF BIRTH:  Jul 27, 1946  DATE OF ADMISSION:  09/08/2017 ADMITTING PHYSICIAN: Demetrios Loll, MD  DATE OF DISCHARGE: 09/09/2017 12:00 PM  PRIMARY CARE PHYSICIAN: Derinda Late, MD    ADMISSION DIAGNOSIS:  fever  DISCHARGE DIAGNOSIS:  Active Problems:   Fever   SECONDARY DIAGNOSIS:   Past Medical History:  Diagnosis Date  . Benign essential hypertension 05/31/2014  . Cancer ([place]) 06/2017   right lung   . Cerebral infarction ([place]) 05/31/2014  . Cerebrovascular disease 05/31/2014  . CKD (chronic kidney disease) stage 3, GFR 30-59 ml/min (HCC) 02/23/2016  . CVA (cerebral infarction)   . Diabetes mellitus without complication ([place])   . Difficult intubation   . DVT (deep venous thrombosis) ([place])   . Esophageal reflux 05/31/2014  . H/O: CVA (cerebrovascular accident)   . Hyperlipidemia   . Hypertension   . ICAO (internal carotid artery occlusion) March 01, 2014   Left  . 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 Pleural effusion 04/23/2017  . Stroke [facility]) April, 4,2015  . Type 2 diabetes mellitus with peripheral neuropathy ([place]) 02/23/2016  . Weakness     HOSPITAL COURSE:   1. Fever. Unclear etiology. The patient had a relatively low white blood cell count. All testing so far has been negative. The patient was given vancomycin in he ER and Zosyn.  Since the patient was afebrile and testing was negative I prescribed Levaquin upon going home. Case discussed with his oncologist Dr. Rogue Bussing. 2. Type 2 diabetes mellitus with peripheral neuropathy. Continue his usual diabetic medications 3. Essential hypertension continue usual medications 4. History of stroke on aspirin and Plavix 5. Hyperlipidemia on pravastatin 6. Chronic kidney disease stage III 7. 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 Follow-up with Dr. Rogue Bussing as outpatient  DISCHARGE CONDITIONS:    satisfactory  CONSULTS OBTAINED:  none  DRUG ALLERGIES:   Allergies  Allergen Reactions  . Sulfa Antibiotics Swelling    Facial swelling No tongue or lips swelling, no difficulty breathing.  . Adhesive [Tape] Itching and Rash  . Amoxicillin Nausea Only    Has patient had a PCN reaction causing immediate rash, facial/tongue/throat swelling, SOB or lightheadedness with hypotension: No Has patient had a PCN reaction causing severe rash involving mucus membranes or skin necrosis: No Has patient had a PCN reaction that required hospitalization: No Has patient had a PCN reaction occurring within the last 10 years: Yes If all of the above answers are "NO", then may proceed with Cephalosporin use.   . Other Rash    Surgical tape    DISCHARGE MEDICATIONS:   Discharge Medication List as of 09/09/2017 11:40 AM    START taking these medications   Details  levofloxacin (LEVAQUIN) 250 MG tablet Take 1 tablet (250 mg total) by mouth daily., Starting Wed 09/10/2017, Print      CONTINUE these medications which have NOT CHANGED   Details  amLODipine (NORVASC) 5 MG tablet Take 5 mg by mouth daily., Historical Med    aspirin EC 81 MG tablet Take 81 mg by mouth daily., Historical Med    clopidogrel (PLAVIX) 75 MG tablet Take 75 mg by mouth daily., Historical Med    dexamethasone (DECADRON) 4 MG tablet Take one pill AM & PM x 3 days; start the day prior to chemo., Normal    fluticasone (FLONASE) 50 MCG/ACT nasal spray Place 1 spray into  both nostrils daily at 2 PM. , Historical Med    folic acid (FOLVITE) 1 MG tablet Take 1 tablet (1 mg total) by mouth daily., Starting Thu 07/31/2017, Normal    Homeopathic Products (NERVE PAIN RELIEF SL) Take 1 tablet by mouth daily. NERVE RENEW (METHYLCOBALAMIN-BENFOTIAMINE-STABILIZED R-ALPHA LIPOIC ACID), Historical Med    liraglutide (VICTOZA) 18 MG/3ML SOPN Inject 1.2 mg into the skin daily., Historical Med    lisinopril (PRINIVIL,ZESTRIL) 10 MG  tablet Take 10 mg by mouth daily., Historical Med    loratadine (CLARITIN) 10 MG tablet Take 10 mg by mouth daily as needed for allergies or rhinitis., Historical Med    metoprolol succinate (TOPROL-XL) 25 MG 24 hr tablet Take 25 mg by mouth daily., Historical Med    Multiple Vitamins-Minerals (CENTRUM SILVER PO) Take 1 tablet by mouth daily., Historical Med    ondansetron (ZOFRAN) 8 MG tablet Take 8 mg by mouth every 8 (eight) hours as needed for nausea or refractory nausea / vomiting. , Starting Thu 07/31/2017, Historical Med    pantoprazole (PROTONIX) 40 MG tablet Take 40 mg by mouth daily before breakfast., Historical Med    polyethylene glycol powder (GLYCOLAX/MIRALAX) powder Take 17 g by mouth daily as needed (for constipation.)., Historical Med    pravastatin (PRAVACHOL) 40 MG tablet Take 40 mg by mouth daily., Historical Med    prochlorperazine (COMPAZINE) 10 MG tablet Take 10 mg by mouth every 8 (eight) hours as needed for vomiting. , Starting Thu 07/31/2017, Historical Med         DISCHARGE INSTRUCTIONS:   Follow-up PMD 2 weeks Follow-up with Dr. Rogue Bussing as outpatient  If you experience worsening of your admission symptoms, develop shortness of breath, life threatening emergency, suicidal or homicidal thoughts you must seek medical attention immediately by calling 911 or calling your MD immediately  if symptoms less severe.  You Must read complete instructions/literature along with all the possible adverse reactions/side effects for all the Medicines you take and that have been prescribed to you. Take any new Medicines after you have completely understood and accept all the possible adverse reactions/side effects.   Please note  You were cared for by a hospitalist during your hospital stay. If you have any questions about your discharge medications or the care you received while you were in the hospital after you are discharged, you can call the unit and asked to speak with  the hospitalist on call if the hospitalist that took care of you is not available. Once you are discharged, your primary care physician will handle any further medical issues. Please note that NO REFILLS for any discharge medications will be authorized once you are discharged, as it is imperative that you return to your primary care physician (or establish a relationship with a primary care physician if you do not have one) for your aftercare needs so that they can reassess your need for medications and monitor your lab values.    Today   CHIEF COMPLAINT:   Chief Complaint  Patient presents with  . Fever    HISTORY OF PRESENT ILLNESS:  Ian Shaffer  is a 71 y.o. male presented with fever   VITAL SIGNS:  Blood pressure 123/74, pulse 70, temperature 97.8 F (36.6 C), temperature source Oral, resp. rate 20, height 5\' 10"  (1.778 m), weight 91.1 kg (200 lb 12.8 oz), SpO2 97 %.    PHYSICAL EXAMINATION:  GENERAL:  71 y.o.-year-old patient lying in the bed with no acute distress.  EYES: Pupils  equal, round, reactive to light and accommodation. No scleral icterus. Extraocular muscles intact.  HEENT: Head atraumatic, normocephalic. Oropharynx and nasopharynx clear.  NECK:  Supple, no jugular venous distention. No thyroid enlargement, no tenderness.  LUNGS: Normal breath sounds bilaterally, no wheezing, rales,rhonchi or crepitation. No use of accessory muscles of respiration.  CARDIOVASCULAR: S1, S2 normal. No murmurs, rubs, or gallops.  ABDOMEN: Soft, non-tender, non-distended. Bowel sounds present. No organomegaly or mass.  EXTREMITIES: No pedal edema, cyanosis, or clubbing.  NEUROLOGIC: Cranial nerves II through XII are intact. Muscle strength 5/5 in all extremities. Sensation intact. Gait not checked.  PSYCHIATRIC: The patient is alert and oriented x 3.  SKIN: No obvious rash, lesion, or ulcer.   DATA REVIEW:   CBC  Recent Labs Lab 09/09/17 0514  WBC 2.8*  HGB 9.4*  HCT 27.7*   PLT 63*    Chemistries   Recent Labs Lab 09/05/17 0931  09/09/17 0514  NA 137  < > 139  K 4.9  < > 3.5  CL 102  < > 109  CO2 28  < > 24  GLUCOSE 124*  < > 148*  BUN 32*  < > 30*  CREATININE 1.35*  < > 1.54*  CALCIUM 9.2  < > 8.5*  AST 39  --   --   ALT 57  --   --   ALKPHOS 72  --   --   BILITOT 0.7  --   --   < > = values in this interval not displayed.   Microbiology Results  Results for orders placed or performed during the hospital encounter of 09/08/17  Blood culture (routine x 2)     Status: None (Preliminary result)   Collection Time: 09/08/17 10:01 PM  Result Value Ref Range Status   Specimen Description BLOOD LT FOREARM  Final   Special Requests   Final    BOTTLES DRAWN AEROBIC AND ANAEROBIC Blood Culture adequate volume   Culture NO GROWTH < 12 HOURS  Final   Report Status PENDING  Incomplete  Blood culture (routine x 2)     Status: None (Preliminary result)   Collection Time: 09/08/17 10:01 PM  Result Value Ref Range Status   Specimen Description BLOOD LT FOREARM  Final   Special Requests   Final    BOTTLES DRAWN AEROBIC AND ANAEROBIC Blood Culture results may not be optimal due to an excessive volume of blood received in culture bottles   Culture NO GROWTH < 12 HOURS  Final   Report Status PENDING  Incomplete  MRSA PCR Screening     Status: None   Collection Time: 09/09/17 12:53 AM  Result Value Ref Range Status   MRSA by PCR NEGATIVE NEGATIVE Final    Comment:        The GeneXpert MRSA Assay (FDA approved for NASAL specimens only), is one component of a comprehensive MRSA colonization surveillance program. It is not intended to diagnose MRSA infection nor to guide or monitor treatment for MRSA infections.     RADIOLOGY:  Dg Chest 2 View  Result Date: 09/08/2017 CLINICAL DATA:  Fever and chills today.  Lung cancer patient. EXAM: CHEST  2 VIEW COMPARISON:  07/04/2017 FINDINGS: Normal heart size. No aortic aneurysm. Pleural place soft tissue  density best seen on the lateral view is again seen posteriorly. Interval decrease of fluid from the right major fissure since prior exam. Removal of right-sided chest tube is noted. No pneumonic consolidation, pneumothorax or CHF. Minimal blunting  right costophrenic angle as before. IMPRESSION: 1. Interval decrease in right-sided pleural fluid from the major fissure. Interval removal of right-sided chest tube. 2. No acute pneumonic consolidation. 3. Stable masslike opacity in the posterior costophrenic angle. Electronically Signed   By: Ashley Royalty M.D.   On: 09/08/2017 23:03      Management plans discussed with the patient, family and they are in agreement.  CODE STATUS:     Code Status Orders        Start     Ordered   09/09/17 0242  Full code  Continuous     09/09/17 0241    Code Status History    Date Active Date Inactive Code Status Order ID Comments User Context   06/25/2017  1:47 PM 06/27/2017  4:59 PM Full Code [id number]  Nestor Lewandowsky, MD Inpatient   04/23/2017  1:34 AM 04/23/2017  4:57 PM Full Code [id number]  Hugelmeyer, Ubaldo Glassing, DO Inpatient      TOTAL TIME TAKING CARE OF THIS PATIENT: 35 minutes.    Loletha Grayer M.D on 09/09/2017 at 3:58 PM  Between 7am to 6pm - Pager - [phone]  After 6pm go to [email or web address] - password [email or web address] [facility]  [phone]  CC: Primary care physician; Derinda Late, MD

## 2017-09-09 NOTE — H&P (Addendum)
[facility] NAME: Ian Shaffer    MR#:  [id number]  DATE OF BIRTH:  Mar 21, 1946  DATE OF ADMISSION:  09/08/2017  PRIMARY CARE PHYSICIAN: Derinda Late, MD   REQUESTING/REFERRING PHYSICIAN: Eula Listen, MD  CHIEF COMPLAINT:   Chief Complaint  Patient presents with  . Fever   Fever today. HISTORY OF PRESENT ILLNESS:  Ian Shaffer  is a 71 y.o. male with a known history of hypertension, lung cancer, DVT, CVA, DM and CKD stage III. The patient presents to the ED with fever today. He is currently undergoing chemotherapy for mesothelioma. He had a fever 101.2 today. But he denies any cough or shortness of breath, no abdominal pain, nausea, vomiting or diarrhea. He denies any urine problems. Dr. Mariea Clonts has concerns about his fever and requestrf admission. The patient was treated with vancomycin and Zosyn in the EVD.  PAST MEDICAL HISTORY:   Past Medical History:  Diagnosis Date  . Benign essential hypertension 05/31/2014  . Cancer ([place]) 06/2017   right lung   . Cerebral infarction ([place]) 05/31/2014  . Cerebrovascular disease 05/31/2014  . CKD (chronic kidney disease) stage 3, GFR 30-59 ml/min 02/23/2016  . CVA (cerebral infarction)   . Diabetes mellitus without complication ([place])   . Difficult intubation   . DVT (deep venous thrombosis) ([place])   . Esophageal reflux 05/31/2014  . H/O: CVA (cerebrovascular accident)   . Hyperlipidemia   . Hypertension   . ICAO (internal carotid artery occlusion) March 01, 2014   Left  . 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 Pleural effusion 04/23/2017  . Stroke [facility]) April, 4,2015  . Type 2 diabetes mellitus with peripheral neuropathy ([place]) 02/23/2016  . Weakness     PAST SURGICAL HISTORY:   Past Surgical History:  Procedure Laterality Date  . CHEST TUBE INSERTION N/A 06/25/2017   Procedure: LFYBOF CATHETER INSERTION;  Surgeon: Nestor Lewandowsky, MD;  Location: [facility]  ORS;  Service: Thoracic;  Laterality: N/A;  . CHEST TUBE INSERTION Right 08/07/2017   Procedure: REMOVAL OF PLEUR X CATHETER;  Surgeon: Nestor Lewandowsky, MD;  Location: [facility] ORS;  Service: General;  Laterality: Right;  . NASAL SINUS SURGERY  2000  . TONSILLECTOMY    . VIDEO ASSISTED THORACOSCOPY Right 06/25/2017   Procedure: VIDEO ASSISTED THORACOSCOPY WITH TALC PLEURODESIS, CHEST TUBE INSERTION;  Surgeon: Nestor Lewandowsky, MD;  Location: [facility] ORS;  Service: Thoracic;  Laterality: Right;    SOCIAL HISTORY:   Social History  Substance Use Topics  . Smoking status: Former Smoker    Types: Pipe    Quit date: 05/07/1974  . Smokeless tobacco: Never Used     Comment: pt states he smoked a pipe a long time ago  . Alcohol use No    FAMILY HISTORY:   Family History  Problem Relation Age of Onset  . Hypertension Mother   . Hypertension Father     DRUG ALLERGIES:   Allergies  Allergen Reactions  . Sulfa Antibiotics Swelling    Facial swelling No tongue or lips swelling, no difficulty breathing.  . Adhesive [Tape] Itching and Rash  . Amoxicillin Nausea Only    Has patient had a PCN reaction causing immediate rash, facial/tongue/throat swelling, SOB or lightheadedness with hypotension: No Has patient had a PCN reaction causing severe rash involving mucus membranes or skin necrosis: No Has patient had a PCN reaction that required hospitalization: No Has patient had a PCN reaction occurring within the  last 10 years: Yes If all of the above answers are "NO", then may proceed with Cephalosporin use.   . Other Rash    Surgical tape    REVIEW OF SYSTEMS:   Review of Systems  Constitutional: Positive for chills and fever. Negative for malaise/fatigue.  HENT: Negative for sore throat.   Eyes: Negative for blurred vision and double vision.  Respiratory: Negative for cough, hemoptysis, shortness of breath, wheezing and stridor.   Cardiovascular: Negative for chest pain, palpitations, orthopnea  and leg swelling.  Gastrointestinal: Negative for abdominal pain, blood in stool, diarrhea, melena, nausea and vomiting.  Genitourinary: Negative for dysuria, flank pain and hematuria.  Musculoskeletal: Negative for back pain and joint pain.  Skin: Negative for rash.  Neurological: Negative for dizziness, sensory change, focal weakness, seizures, loss of consciousness, weakness and headaches.  Endo/Heme/Allergies: Negative for polydipsia.  Psychiatric/Behavioral: Negative for depression. The patient is not nervous/anxious.     MEDICATIONS AT HOME:   Prior to Admission medications   Medication Sig Start Date End Date Taking? Authorizing Provider  amLODipine (NORVASC) 5 MG tablet Take 5 mg by mouth daily.   Yes [provider]  aspirin EC 81 MG tablet Take 81 mg by mouth daily.   Yes [provider]  clopidogrel (PLAVIX) 75 MG tablet Take 75 mg by mouth daily.   Yes [provider]  dexamethasone (DECADRON) 4 MG tablet Take one pill AM & PM x 3 days; start the day prior to chemo. 07/31/17  Yes Cammie Sickle, MD  fluticasone (FLONASE) 50 MCG/ACT nasal spray Place 1 spray into both nostrils daily at 2 PM.    Yes [provider]  folic acid (FOLVITE) 1 MG tablet Take 1 tablet (1 mg total) by mouth daily. Patient taking differently: Take 1 mg by mouth daily at 2 PM.  07/31/17  Yes Cammie Sickle, MD  Homeopathic Products (NERVE PAIN RELIEF SL) Take 1 tablet by mouth daily. NERVE RENEW (METHYLCOBALAMIN-BENFOTIAMINE-STABILIZED R-ALPHA LIPOIC ACID)   Yes [provider]  liraglutide (VICTOZA) 18 MG/3ML SOPN Inject 1.2 mg into the skin daily.   Yes [provider]  lisinopril (PRINIVIL,ZESTRIL) 10 MG tablet Take 10 mg by mouth daily.   Yes [provider]  loratadine (CLARITIN) 10 MG tablet Take 10 mg by mouth daily as needed for allergies or rhinitis.   Yes [provider]  metoprolol succinate (TOPROL-XL) 25 MG 24 hr  tablet Take 25 mg by mouth daily.   Yes [provider]  Multiple Vitamins-Minerals (CENTRUM SILVER PO) Take 1 tablet by mouth daily.   Yes [provider]  ondansetron (ZOFRAN) 8 MG tablet Take 8 mg by mouth every 8 (eight) hours as needed for nausea or refractory nausea / vomiting.  07/31/17  Yes [provider]  pantoprazole (PROTONIX) 40 MG tablet Take 40 mg by mouth daily before breakfast.   Yes [provider]  polyethylene glycol powder (GLYCOLAX/MIRALAX) powder Take 17 g by mouth daily as needed (for constipation.).   Yes [provider]  pravastatin (PRAVACHOL) 40 MG tablet Take 40 mg by mouth daily.   Yes [provider]  prochlorperazine (COMPAZINE) 10 MG tablet Take 10 mg by mouth every 8 (eight) hours as needed for vomiting.  07/31/17  Yes [provider]      VITAL SIGNS:  Blood pressure 110/66, pulse 72, temperature 98.7 F (37.1 C), temperature source Oral, resp. rate 20, height 5\' 10"  (1.778 m), weight 200 lb 12.8  oz (91.1 kg), SpO2 98 %.  PHYSICAL EXAMINATION:  Physical Exam  GENERAL:  71 y.o.-year-old patient lying in the bed with no acute distress.  EYES: Pupils equal, round, reactive to light and accommodation. No scleral icterus. Extraocular muscles intact.  HEENT: Head atraumatic, normocephalic. Oropharynx and nasopharynx clear.  NECK:  Supple, no jugular venous distention. No thyroid enlargement, no tenderness.  LUNGS: Normal breath sounds bilaterally, no wheezing, rales,rhonchi or crepitation. No use of accessory muscles of respiration.  CARDIOVASCULAR: S1, S2 normal. No murmurs, rubs, or gallops.  ABDOMEN: Soft, nontender, nondistended. Bowel sounds present. No organomegaly or mass.  EXTREMITIES: No pedal edema, cyanosis, or clubbing.  NEUROLOGIC: Cranial nerves II through XII are intact. Muscle strength 5/5 in all extremities. Sensation intact. Gait not checked.  PSYCHIATRIC: The patient is alert and  oriented x 3.  SKIN: No obvious rash, lesion, or ulcer.   LABORATORY PANEL:   CBC  Recent Labs Lab 09/08/17 2204  WBC 3.3*  HGB 10.8*  HCT 31.1*  PLT 74*   ------------------------------------------------------------------------------------------------------------------  Chemistries   Recent Labs Lab 09/05/17 0931 09/08/17 2204  NA 137 138  K 4.9 4.2  CL 102 107  CO2 28 24  GLUCOSE 124* 138*  BUN 32* 30*  CREATININE 1.35* 1.47*  CALCIUM 9.2 9.0  AST 39  --   ALT 57  --   ALKPHOS 72  --   BILITOT 0.7  --    ------------------------------------------------------------------------------------------------------------------  Cardiac Enzymes No results for input(s): TROPONINI in the last 168 hours. ------------------------------------------------------------------------------------------------------------------  RADIOLOGY:  Dg Chest 2 View  Result Date: 09/08/2017 CLINICAL DATA:  Fever and chills today.  Lung cancer patient. EXAM: CHEST  2 VIEW COMPARISON:  07/04/2017 FINDINGS: Normal heart size. No aortic aneurysm. Pleural place soft tissue density best seen on the lateral view is again seen posteriorly. Interval decrease of fluid from the right major fissure since prior exam. Removal of right-sided chest tube is noted. No pneumonic consolidation, pneumothorax or CHF. Minimal blunting right costophrenic angle as before. IMPRESSION: 1. Interval decrease in right-sided pleural fluid from the major fissure. Interval removal of right-sided chest tube. 2. No acute pneumonic consolidation. 3. Stable masslike opacity in the posterior costophrenic angle. Electronically Signed   By: Ashley Royalty M.D.   On: 09/08/2017 23:03      IMPRESSION AND PLAN:   Leukopenic Fever The patient will be placed for observation. He was treated with vancomycin and Zosyn in the ED. I will continue Zosyn, follow-up CBC and blood culture.  Leukopenia. Possible due to chemotherapy. Follow-up  CBC. Thrombocytopenia. Possible due to chemotherapy, follow-up CBC. CKD stage 3. Stable. Diabetes. Start a sliding scale.  All the records are reviewed and case discussed with ED provider. Management plans discussed with the patient, family and they are in agreement.  CODE STATUS: full code  TOTAL TIME TAKING CARE OF THIS PATIENT:  45 minutes.    Demetrios Loll M.D on 09/09/2017 at 12:45 AM  Between 7am to 6pm - Pager - [phone]  After 6pm go to [email or web address] - Proofreader  [facility]  Office  [phone]  CC: Primary care physician; Derinda Late, MD   Note: This dictation was prepared with Dragon dictation along with smaller phrase technology. Any transcriptional errors that result from this process are unin

## 2017-09-09 NOTE — Progress Notes (Signed)
Patient and wife given discharge instructions written and verbal. Verbalized information back. Prescriptions given. Patient with no distress, no needs.  All questions answered. D/C via w/c and volunteer services with wife at side.

## 2017-09-10 LAB — URINE CULTURE: CULTURE: NO GROWTH

## 2017-09-13 LAB — CULTURE, BLOOD (ROUTINE X 2)
CULTURE: NO GROWTH
Culture: NO GROWTH
Special Requests: ADEQUATE

## 2017-09-15 ENCOUNTER — Ambulatory Visit
Admission: RE | Admit: 2017-09-15 | Discharge: 2017-09-15 | Disposition: A | Discharge status: Home / Self Care | Payer: Medicare Other | Source: Ambulatory Visit | Attending: Internal Medicine | Admitting: Internal Medicine

## 2017-09-15 DIAGNOSIS — R1901 Right upper quadrant abdominal swelling, mass and lump: Secondary | ICD-10-CM | POA: Diagnosis not present

## 2017-09-15 DIAGNOSIS — R590 Localized enlarged lymph nodes: Secondary | ICD-10-CM | POA: Insufficient documentation

## 2017-09-15 DIAGNOSIS — C801 Malignant (primary) neoplasm, unspecified: Secondary | ICD-10-CM | POA: Insufficient documentation

## 2017-09-15 DIAGNOSIS — R918 Other nonspecific abnormal finding of lung field: Secondary | ICD-10-CM | POA: Insufficient documentation

## 2017-09-15 MED ORDER — IOPAMIDOL (ISOVUE-300) INJECTION 61%
60.0000 mL | Freq: Once | INTRAVENOUS | Status: AC | PRN
  Administered 2017-09-15: 60 mL via INTRAVENOUS

## 2017-09-17 ENCOUNTER — Telehealth: Payer: Self-pay | Admitting: Internal Medicine

## 2017-09-17 ENCOUNTER — Inpatient Hospital Stay (HOSPITAL_BASED_OUTPATIENT_CLINIC_OR_DEPARTMENT_OTHER): Payer: Medicare Other | Admitting: Internal Medicine

## 2017-09-17 ENCOUNTER — Inpatient Hospital Stay: Payer: Medicare Other

## 2017-09-17 ENCOUNTER — Encounter: Payer: Self-pay | Admitting: Internal Medicine

## 2017-09-17 VITALS — BP 147/79 | HR 67 | Temp 97.8°F | Resp 16 | Wt 205.0 lb

## 2017-09-17 DIAGNOSIS — J90 Pleural effusion, not elsewhere classified: Secondary | ICD-10-CM

## 2017-09-17 DIAGNOSIS — Z5111 Encounter for antineoplastic chemotherapy: Secondary | ICD-10-CM | POA: Diagnosis not present

## 2017-09-17 DIAGNOSIS — D701 Agranulocytosis secondary to cancer chemotherapy: Secondary | ICD-10-CM | POA: Diagnosis not present

## 2017-09-17 DIAGNOSIS — Z79899 Other long term (current) drug therapy: Secondary | ICD-10-CM

## 2017-09-17 DIAGNOSIS — T451X5S Adverse effect of antineoplastic and immunosuppressive drugs, sequela: Secondary | ICD-10-CM | POA: Diagnosis not present

## 2017-09-17 DIAGNOSIS — C801 Malignant (primary) neoplasm, unspecified: Secondary | ICD-10-CM

## 2017-09-17 DIAGNOSIS — C459 Mesothelioma, unspecified: Secondary | ICD-10-CM | POA: Diagnosis not present

## 2017-09-17 DIAGNOSIS — T451X5A Adverse effect of antineoplastic and immunosuppressive drugs, initial encounter: Secondary | ICD-10-CM

## 2017-09-17 LAB — COMPREHENSIVE METABOLIC PANEL
ALBUMIN: 3.8 g/dL (ref 3.5–5.0)
ALT: 51 U/L (ref 17–63)
AST: 36 U/L (ref 15–41)
Alkaline Phosphatase: 74 U/L (ref 38–126)
Anion gap: 10 (ref 5–15)
BUN: 27 mg/dL — AB (ref 6–20)
CHLORIDE: 106 mmol/L (ref 101–111)
CO2: 24 mmol/L (ref 22–32)
Calcium: 9.3 mg/dL (ref 8.9–10.3)
Creatinine, Ser: 1.46 mg/dL — ABNORMAL HIGH (ref 0.61–1.24)
GFR calc Af Amer: 53 mL/min — ABNORMAL LOW (ref >60)
GFR calc non Af Amer: 46 mL/min — ABNORMAL LOW (ref >60)
GLUCOSE: 166 mg/dL — AB (ref 65–99)
Potassium: 4.2 mmol/L (ref 3.5–5.1)
SODIUM: 140 mmol/L (ref 135–145)
Total Bilirubin: 0.4 mg/dL (ref 0.3–1.2)
Total Protein: 6.8 g/dL (ref 6.5–8.1)

## 2017-09-17 LAB — CBC WITH DIFFERENTIAL/PLATELET
BASOS ABS: 0 10*3/uL (ref 0–0.1)
BASOS PCT: 1 %
EOS ABS: 0 10*3/uL (ref 0–0.7)
Eosinophils Relative: 1 %
HCT: 30.5 % — ABNORMAL LOW (ref 40.0–52.0)
HEMOGLOBIN: 10.7 g/dL — AB (ref 13.0–18.0)
LYMPHS ABS: 0.8 10*3/uL — AB (ref 1.0–3.6)
Lymphocytes Relative: 15 %
MCH: 31 pg (ref 26.0–34.0)
MCHC: 35.2 g/dL (ref 32.0–36.0)
MCV: 88 fL (ref 80.0–100.0)
Monocytes Absolute: 0.6 10*3/uL (ref 0.2–1.0)
Monocytes Relative: 12 %
Neutro Abs: 3.6 10*3/uL (ref 1.4–6.5)
Neutrophils Relative %: 71 %
PLATELETS: 351 10*3/uL (ref 150–440)
RBC: 3.47 MIL/uL — AB (ref 4.40–5.90)
RDW: 16.3 % — AB (ref 11.5–14.5)
WBC: 5.1 10*3/uL (ref 3.8–10.6)

## 2017-09-17 MED ORDER — CYANOCOBALAMIN 1000 MCG/ML IJ SOLN
1000.0000 ug | Freq: Once | INTRAMUSCULAR | Status: AC
  Administered 2017-09-17: 1000 ug via INTRAMUSCULAR
  Filled 2017-09-17: qty 1

## 2017-09-17 MED ORDER — PALONOSETRON HCL INJECTION 0.25 MG/5ML
0.2500 mg | Freq: Once | INTRAVENOUS | Status: AC
  Administered 2017-09-17: 0.25 mg via INTRAVENOUS
  Filled 2017-09-17: qty 5

## 2017-09-17 MED ORDER — PEGFILGRASTIM INJECTION 6 MG/0.6ML ~~LOC~~: 6.0000 mg | PREFILLED_SYRINGE | Freq: Once | SUBCUTANEOUS | Status: DC

## 2017-09-17 MED ORDER — SODIUM CHLORIDE 0.9 % IV SOLN
Freq: Once | INTRAVENOUS | Status: AC
  Administered 2017-09-17: 11:00:00 via INTRAVENOUS
  Filled 2017-09-17: qty 1000

## 2017-09-17 MED ORDER — DEXAMETHASONE SODIUM PHOSPHATE 10 MG/ML IJ SOLN
10.0000 mg | Freq: Once | INTRAMUSCULAR | Status: AC
  Administered 2017-09-17: 10 mg via INTRAVENOUS
  Filled 2017-09-17: qty 1

## 2017-09-17 MED ORDER — SODIUM CHLORIDE 0.9 % IV SOLN
450.0000 mg | Freq: Once | INTRAVENOUS | Status: AC
  Administered 2017-09-17: 450 mg via INTRAVENOUS
  Filled 2017-09-17: qty 45

## 2017-09-17 MED ORDER — SODIUM CHLORIDE 0.9 % IV SOLN
1000.0000 mg | Freq: Once | INTRAVENOUS | Status: AC
  Administered 2017-09-17: 1000 mg via INTRAVENOUS
  Filled 2017-09-17: qty 40

## 2017-09-17 NOTE — Progress Notes (Signed)
Patient is here for follow up with labs and treatment with Carbo and Alimta. He states that he is feeling well today and denies having any pain.

## 2017-09-17 NOTE — Assessment & Plan Note (Addendum)
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 On carbo-Alimta s/p cycle #2- CT chest STABLE disease [slight increase in size of the supra diaphragmatic mass; slight decrease in size of the subdiaphragmatic].  No evidence of clinical progression; tolerated chemotherapy well- except for episode of fever/neutropenia admission the hospital.  # [place]-alimta proceed with cycle #3 today. Labs today reviewed;  acceptable for treatment today. We will plan to get CT scan after 4 cycles. If there is continued progression after 4 cycles- I would recommend proceeding with clinical trial at [facility]. Discussed with Dr.Stinchcomb.   # Neutropenic fever- Growth factor-Neulasta/ would be given as prophylaxis for chemotherapy-induced neutropenia to prevent febrile neutropenias. Discussed potential side effect- myalgias/arthralgias- recommend Claritin for 7 days.   # Skin rash- ? Sec to Alimta- resolved. Recommend steroids x 4 days post chemo.  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 Stable.   # discussed re: Blood sugars checking on dex;   # follow up in 10 days;and then in 3 weeks/chemo/labs. Come back tomorrow for neulasta injection. Pt agrees.

## 2017-09-17 NOTE — Telephone Encounter (Signed)
I ordered Neulasta for tomorrow- 10/25; and also onpro with his future cycles of chemo. Please make sure- this is authrorized. Thx

## 2017-09-17 NOTE — Progress Notes (Signed)
Right not to proceed [place] NOTE  Patient Care Team: Derinda Late, MD as PCP - General (Family Medicine) Margaretha Sheffield, MD (Otolaryngology) Telford Nab, RN as Registered Nurse Nestor Lewandowsky, MD as Referring Physician (Cardiothoracic Surgery) Cammie Sickle, MD as Consulting Physician (Internal Medicine)  CHIEF COMPLAINTS/PURPOSE OF CONSULTATION: Malignant pleural effusion.   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 PN  # MOLECULAR TESTING- PDL-1- 1% [LOW]; [facility]- No actionable**     [name], malignant ([place])     HISTORY OF PRESENTING ILLNESS:  Ian Shaffer 71 y.o.  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   Otherwise patient tolerated cycle #2 of Carbo and Alimta fairly well. Patient did not have any rash after this cycle of chemotherapy. Patient's breathing is normal. No cough. No pain.   ROS: A complete 10 point review of system is done which is negative except mentioned above in history of present illness  MEDICAL HISTORY:  Past Medical History:  Diagnosis Date  .  Benign essential hypertension 05/31/2014  . Cancer ([place]) 06/2017   right lung   . Cerebral infarction ([place]) 05/31/2014  . Cerebrovascular disease 05/31/2014  . CKD (chronic kidney disease) stage 3, GFR 30-59 ml/min ([facility]) 02/23/2016  . CVA (cerebral infarction)   . Diabetes mellitus without complication ([place])   . Difficult intubation   . DVT (deep venous thrombosis) ([place])   . Esophageal reflux 05/31/2014  . H/O: CVA (cerebrovascular accident)   . Hyperlipidemia   . Hypertension   . ICAO (internal carotid artery occlusion) March 01, 2014   Left  . 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 Pleural effusion 04/23/2017  . Stroke [facility]) April, 4,2015  . Type 2 diabetes mellitus with peripheral neuropathy ([place]) 02/23/2016  . Weakness     SURGICAL HISTORY: Past Surgical History:  Procedure Laterality Date  . CHEST TUBE INSERTION N/A 06/25/2017   Procedure: MCNOBS CATHETER INSERTION;  Surgeon: Nestor Lewandowsky, MD;  Location: [facility] ORS;  Service: Thoracic;  Laterality: N/A;  . CHEST TUBE INSERTION Right 08/07/2017   Procedure: REMOVAL OF PLEUR X CATHETER;  Surgeon: Nestor Lewandowsky, MD;  Location: [facility] ORS;  Service: General;  Laterality: Right;  . NASAL SINUS SURGERY  2000  . TONSILLECTOMY    . VIDEO ASSISTED THORACOSCOPY Right 06/25/2017   Procedure: VIDEO ASSISTED THORACOSCOPY WITH TALC PLEURODESIS, CHEST TUBE INSERTION;  Surgeon: Nestor Lewandowsky, MD;  Location: [facility] ORS;  Service: Thoracic;  Laterality: Right;    SOCIAL HISTORY: no smoke/ no alcohol ; [place]; retd [employer]; but  dad had furnace- ? Few years in child hood;Exposure to agent orange in [place]. Has no children.  Social History  Social History  . Marital status: Married    Spouse name: N/A  . Number of children: N/A  . Years of education: N/A   Occupational History  . Not on file.   Social History Main Topics  . Smoking status: Former Smoker    Types:  Pipe    Quit date: 05/07/1974  . Smokeless tobacco: Never Used     Comment: pt states he smoked a pipe a long time ago  . Alcohol use No  . Drug use: No  . Sexual activity: Not on file   Other Topics Concern  . Not on file   Social History Narrative  . No narrative on file    FAMILY HISTORY: Family History  Problem Relation Age of Onset  . Hypertension Mother   . Hypertension Father     ALLERGIES:  is allergic to sulfa antibiotics; adhesive [tape]; amoxicillin; and other.  MEDICATIONS:  Current Outpatient Prescriptions  Medication Sig Dispense Refill  . amLODipine (NORVASC) 5 MG tablet Take 5 mg by mouth daily.    Marland Kitchen aspirin EC 81 MG tablet Take 81 mg by mouth daily.    . clopidogrel (PLAVIX) 75 MG tablet Take 75 mg by mouth daily.    Marland Kitchen dexamethasone (DECADRON) 4 MG tablet Take one pill AM & PM x 3 days; start the day prior to chemo. 60 tablet 0  . fluticasone (FLONASE) 50 MCG/ACT nasal spray Place 1 spray into both nostrils daily at 2 PM.     . folic acid (FOLVITE) 1 MG tablet Take 1 tablet (1 mg total) by mouth daily. (Patient taking differently: Take 1 mg by mouth daily at 2 PM. ) 90 tablet 1  . liraglutide (VICTOZA) 18 MG/3ML SOPN Inject 1.2 mg into the skin daily.    Marland Kitchen lisinopril (PRINIVIL,ZESTRIL) 10 MG tablet Take 10 mg by mouth daily.    . metoprolol succinate (TOPROL-XL) 25 MG 24 hr tablet Take 25 mg by mouth daily.    . Multiple Vitamins-Minerals (CENTRUM SILVER PO) Take 1 tablet by mouth daily.    . ondansetron (ZOFRAN) 8 MG tablet Take 8 mg by mouth every 8 (eight) hours as needed for nausea or refractory nausea / vomiting.     . pantoprazole (PROTONIX) 40 MG tablet Take 40 mg by mouth daily before breakfast.    . pravastatin (PRAVACHOL) 40 MG tablet Take 40 mg by mouth daily.    . prochlorperazine (COMPAZINE) 10 MG tablet Take 10 mg by mouth every 8 (eight) hours as needed for vomiting.     . Homeopathic Products (NERVE PAIN RELIEF SL) Take 1 tablet by mouth daily.  NERVE RENEW (METHYLCOBALAMIN-BENFOTIAMINE-STABILIZED R-ALPHA LIPOIC ACID)    . polyethylene glycol powder (GLYCOLAX/MIRALAX) powder Take 17 g by mouth daily as needed (for constipation.).     No current facility-administered medications for this visit.       Marland Kitchen  PHYSICAL EXAMINATION: ECOG PERFORMANCE STATUS: 0 - Asymptomatic  Vitals:   09/17/17 0926  BP: (!) 147/79  Pulse: 67  Resp: 16  Temp: 97.8 F (36.6 C)   Filed Weights   09/17/17 0926  Weight: 205 lb (93 kg)    GENERAL: Well-nourished well-developed; Alert, no distress and comfortable.   Accompanied by his wife. EYES: no pallor or icterus OROPHARYNX: no thrush or ulceration; good dentition  NECK: supple, no masses felt LYMPH:  no palpable lymphadenopathy in the cervical, axillary or inguinal regions LUNGS: Decreased breath sounds on the right side compared to the left. No  wheeze or crackles HEART/CVS: regular rate & rhythm and no murmurs; No lower extremity edema ABDOMEN: abdomen soft, non-tender and normal bowel sounds Musculoskeletal:no cyanosis of digits and no clubbing  PSYCH: alert & oriented x 3 with fluent speech NEURO: no focal motor/sensory deficits SKIN:  no rashes or significant lesions  LABORATORY DATA:  I have reviewed the data as listed Lab Results  Component Value Date   WBC 5.1 09/17/2017   HGB 10.7 (L) 09/17/2017   HCT 30.5 (L) 09/17/2017   MCV 88.0 09/17/2017   PLT 351 09/17/2017    Recent Labs  08/27/17 0838 09/05/17 0931 09/08/17 2204 09/09/17 0514 09/17/17 0853  NA 138 137 138 139 140  K 4.3 4.9 4.2 3.5 4.2  CL 103 102 107 109 106  CO2 26 28 24 24 24   GLUCOSE 126* 124* 138* 148* 166*  BUN 26* 32* 30* 30* 27*  CREATININE 1.37* 1.35* 1.47* 1.54* 1.46*  CALCIUM 9.5 9.2 9.0 8.5* 9.3  GFRNONAA 50* 50* 46* 43* 46*  GFRAA 57* 59* 53* 50* 53*  PROT 7.0 6.6  --   --  6.8  ALBUMIN 4.0 3.9  --   --  3.8  AST 34 39  --   --  36  ALT 42 57  --   --  51  ALKPHOS 72 72  --   --  74   BILITOT 0.4 0.7  --   --  0.4    RADIOGRAPHIC STUDIES: I have personally reviewed the radiological images as listed and agreed with the findings in the report. Dg Chest 2 View  Result Date: 09/08/2017 CLINICAL DATA:  Fever and chills today.  Lung cancer patient. EXAM: CHEST  2 VIEW COMPARISON:  07/04/2017 FINDINGS: Normal heart size. No aortic aneurysm. Pleural place soft tissue density best seen on the lateral view is again seen posteriorly. Interval decrease of fluid from the right major fissure since prior exam. Removal of right-sided chest tube is noted. No pneumonic consolidation, pneumothorax or CHF. Minimal blunting right costophrenic angle as before. IMPRESSION: 1. Interval decrease in right-sided pleural fluid from the major fissure. Interval removal of right-sided chest tube. 2. No acute pneumonic consolidation. 3. Stable masslike opacity in the posterior costophrenic angle. Electronically Signed   By: Ashley Royalty M.D.   On: 09/08/2017 23:03   Ct Chest W Contrast  Result Date: 09/15/2017 CLINICAL DATA:  Probable malignant knees epithelium a of the RIGHT lower lobe and sub diaphragmatic RIGHT upper quadrant. Patient status post chemotherapy and pleurodesis EXAM: CT CHEST WITH CONTRAST TECHNIQUE: Multidetector CT imaging of the chest was performed during intravenous contrast administration. CONTRAST:  44m ISOVUE-300 IOPA the LEFT lower lobe mass appears to involve the posterior RIGHT hepatic lobe the liver (image 123, series 2 no IV contrast MIDOL (ISOVUE-300) INJECTION 61% COMPARISON:  PET-CT scan 07/09/2017 FINDINGS: Cardiovascular: No significant vascular findings. Normal heart size. No pericardial effusion. Mediastinum/Nodes: No axillary supraclavicular adenopathy. Mild mediastinal and RIGHT hilar adenopathy is not changed. Lungs/Pleura: Mass in the RIGHT lower lobe contiguous with the diaphragm measures 7.5 x 7.1 cm compared with 6.8 by 6.7 cm. Mass extends in through the diaphragm. The  sub diaphragmatic portion measures 3.0 by 2.4 cm compared with 3.9 by 3.4 cm. There is pleural thickening along the medial RIGHT lower lobe type 13 mm similar prior 11 mm. More superiorly nodular thickening is slightly improved in the RIGHT lower lobe One nodule in the RIGHT lower lobe measuring 4 mm (image 103, series 2)  is not seen on prior). Upper Abdomen: Sub diaphragmatic mass on the LEFT as described above. Adrenal glands normal. Focal hepatic lesion is Musculoskeletal: No aggressive osseous lesion IMPRESSION: 1. RIGHT lower lobe mass contiguous with the diaphragm measures slightly larger. 2. Sub diaphragmatic portion the mass in the RIGHT upper quadrant measures slightly smaller. 3. Suspicion of involvement of the posterior RIGHT hepatic lobe with the trans diaphragmatic mass. 4. Pleural thickening in the medial RIGHT lower lobe is similar. 5. Single small 4 mm nodule in the RIGHT lower lobe not seen on prior. Recommend attention follow-up. 6. Mild mediastinal adenopathy which is not hypermetabolic on comparison PET-CT scan is not changed. Electronically Signed   By: Suzy Bouchard M.D.   On: 09/15/2017 11:24    ASSESSMENT & PLAN:   [name], malignant ([place]) # [place] mesothelioma- [approximately 10 cm mass- extending above and below the diaphragm on the right side]. On carbo-Alimta s/p cycle #2- CT chest STABLE disease [slight increase in size of the supra diaphragmatic mass; slight decrease in size of the subdiaphragmatic].  No evidence of clinical progression; tolerated chemotherapy well- except for episode of fever/neutropenia admission the hospital.  # [place]-alimta proceed with cycle #3 today. Labs today reviewed;  acceptable for treatment today. We will plan to get CT scan after 4 cycles. If there is continued progression after 4 cycles- I would recommend proceeding with clinical trial at [facility]. Discussed with Dr.Stinchcomb.   # Neutropenic fever- Growth factor-Neulasta/ would be given as  prophylaxis for chemotherapy-induced neutropenia to prevent febrile neutropenias. Discussed potential side effect- myalgias/arthralgias- recommend Claritin for 7 days.   # Skin rash- ? Sec to Alimta- resolved. Recommend steroids x 4 days post chemo.  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 Stable.   # discussed re: Blood sugars checking on dex;   # follow up in 10 days;and then in 3 weeks/chemo/labs. Come back tomorrow for neulasta injection. Pt agrees.   All questions were answered. The patient knows to call the clinic with any problems, questions or concerns.    Cammie Sickle, MD 09/17/2017 5:57 PM

## 2017-09-18 ENCOUNTER — Inpatient Hospital Stay: Payer: Medicare Other

## 2017-09-18 DIAGNOSIS — Z5111 Encounter for antineoplastic chemotherapy: Secondary | ICD-10-CM | POA: Diagnosis not present

## 2017-09-18 DIAGNOSIS — D701 Agranulocytosis secondary to cancer chemotherapy: Secondary | ICD-10-CM

## 2017-09-18 DIAGNOSIS — T451X5A Adverse effect of antineoplastic and immunosuppressive drugs, initial encounter: Principal | ICD-10-CM

## 2017-09-18 MED ORDER — PEGFILGRASTIM INJECTION 6 MG/0.6ML ~~LOC~~
6.0000 mg | PREFILLED_SYRINGE | Freq: Once | SUBCUTANEOUS | Status: AC
  Administered 2017-09-18: 6 mg via SUBCUTANEOUS

## 2017-09-23 ENCOUNTER — Telehealth: Payer: Self-pay | Admitting: *Deleted

## 2017-09-23 MED ORDER — MAGIC MOUTHWASH W/LIDOCAINE: 10.0000 mL | Freq: Three times a day (TID) | ORAL | 0 refills | Status: DC

## 2017-09-23 NOTE — Telephone Encounter (Signed)
Pt called in complaining of mouth sores. Per Sonia Baller, NP, can call in prescription for dukes magic mouthwash w/ lidocaine. Prescription escribed to pharmacy and pt notified.

## 2017-09-26 ENCOUNTER — Inpatient Hospital Stay: Payer: Medicare Other | Attending: Internal Medicine

## 2017-09-26 DIAGNOSIS — Z7689 Persons encountering health services in other specified circumstances: Secondary | ICD-10-CM | POA: Diagnosis not present

## 2017-09-26 DIAGNOSIS — I129 Hypertensive chronic kidney disease with stage 1 through stage 4 chronic kidney disease, or unspecified chronic kidney disease: Secondary | ICD-10-CM | POA: Insufficient documentation

## 2017-09-26 DIAGNOSIS — E785 Hyperlipidemia, unspecified: Secondary | ICD-10-CM | POA: Diagnosis not present

## 2017-09-26 DIAGNOSIS — T451X5A Adverse effect of antineoplastic and immunosuppressive drugs, initial encounter: Secondary | ICD-10-CM

## 2017-09-26 DIAGNOSIS — K219 Gastro-esophageal reflux disease without esophagitis: Secondary | ICD-10-CM | POA: Diagnosis not present

## 2017-09-26 DIAGNOSIS — N183 Chronic kidney disease, stage 3 (moderate): Secondary | ICD-10-CM | POA: Insufficient documentation

## 2017-09-26 DIAGNOSIS — K5900 Constipation, unspecified: Secondary | ICD-10-CM | POA: Diagnosis not present

## 2017-09-26 DIAGNOSIS — C457 Mesothelioma of other sites: Secondary | ICD-10-CM | POA: Insufficient documentation

## 2017-09-26 DIAGNOSIS — Z7982 Long term (current) use of aspirin: Secondary | ICD-10-CM | POA: Insufficient documentation

## 2017-09-26 DIAGNOSIS — Z87891 Personal history of nicotine dependence: Secondary | ICD-10-CM | POA: Diagnosis not present

## 2017-09-26 DIAGNOSIS — J90 Pleural effusion, not elsewhere classified: Secondary | ICD-10-CM | POA: Diagnosis not present

## 2017-09-26 DIAGNOSIS — I679 Cerebrovascular disease, unspecified: Secondary | ICD-10-CM | POA: Diagnosis not present

## 2017-09-26 DIAGNOSIS — E1122 Type 2 diabetes mellitus with diabetic chronic kidney disease: Secondary | ICD-10-CM | POA: Insufficient documentation

## 2017-09-26 DIAGNOSIS — Z8673 Personal history of transient ischemic attack (TIA), and cerebral infarction without residual deficits: Secondary | ICD-10-CM | POA: Insufficient documentation

## 2017-09-26 DIAGNOSIS — Z5111 Encounter for antineoplastic chemotherapy: Secondary | ICD-10-CM | POA: Diagnosis not present

## 2017-09-26 DIAGNOSIS — Z79899 Other long term (current) drug therapy: Secondary | ICD-10-CM | POA: Insufficient documentation

## 2017-09-26 DIAGNOSIS — C459 Mesothelioma, unspecified: Secondary | ICD-10-CM

## 2017-09-26 DIAGNOSIS — D701 Agranulocytosis secondary to cancer chemotherapy: Secondary | ICD-10-CM

## 2017-09-26 LAB — CBC WITH DIFFERENTIAL/PLATELET
BASOS ABS: 0 10*3/uL (ref 0–0.1)
Basophils Relative: 1 %
EOS PCT: 1 %
Eosinophils Absolute: 0 10*3/uL (ref 0–0.7)
HCT: 29.2 % — ABNORMAL LOW (ref 40.0–52.0)
Hemoglobin: 10.1 g/dL — ABNORMAL LOW (ref 13.0–18.0)
LYMPHS PCT: 21 %
Lymphs Abs: 0.6 10*3/uL — ABNORMAL LOW (ref 1.0–3.6)
MCH: 30.7 pg (ref 26.0–34.0)
MCHC: 34.5 g/dL (ref 32.0–36.0)
MCV: 89 fL (ref 80.0–100.0)
MONO ABS: 0.4 10*3/uL (ref 0.2–1.0)
Monocytes Relative: 15 %
Neutro Abs: 1.9 10*3/uL (ref 1.4–6.5)
Neutrophils Relative %: 62 %
PLATELETS: 140 10*3/uL — AB (ref 150–440)
RBC: 3.28 MIL/uL — ABNORMAL LOW (ref 4.40–5.90)
RDW: 17 % — AB (ref 11.5–14.5)
WBC: 3 10*3/uL — ABNORMAL LOW (ref 3.8–10.6)

## 2017-09-26 LAB — BASIC METABOLIC PANEL
Anion gap: 8 (ref 5–15)
BUN: 33 mg/dL — AB (ref 6–20)
CALCIUM: 9.2 mg/dL (ref 8.9–10.3)
CO2: 24 mmol/L (ref 22–32)
CREATININE: 1.41 mg/dL — AB (ref 0.61–1.24)
Chloride: 106 mmol/L (ref 101–111)
GFR calc Af Amer: 56 mL/min — ABNORMAL LOW (ref >60)
GFR, EST NON AFRICAN AMERICAN: 48 mL/min — AB (ref >60)
GLUCOSE: 120 mg/dL — AB (ref 65–99)
Potassium: 4.8 mmol/L (ref 3.5–5.1)
Sodium: 138 mmol/L (ref 135–145)

## 2017-10-08 ENCOUNTER — Encounter: Payer: Self-pay | Admitting: Internal Medicine

## 2017-10-08 ENCOUNTER — Inpatient Hospital Stay: Payer: Medicare Other

## 2017-10-08 ENCOUNTER — Inpatient Hospital Stay (HOSPITAL_BASED_OUTPATIENT_CLINIC_OR_DEPARTMENT_OTHER): Payer: Medicare Other | Admitting: Internal Medicine

## 2017-10-08 VITALS — BP 145/74 | HR 98 | Temp 97.4°F | Resp 16 | Wt 205.2 lb

## 2017-10-08 DIAGNOSIS — K5900 Constipation, unspecified: Secondary | ICD-10-CM

## 2017-10-08 DIAGNOSIS — Z79899 Other long term (current) drug therapy: Secondary | ICD-10-CM

## 2017-10-08 DIAGNOSIS — C457 Mesothelioma of other sites: Secondary | ICD-10-CM

## 2017-10-08 DIAGNOSIS — J90 Pleural effusion, not elsewhere classified: Secondary | ICD-10-CM | POA: Diagnosis not present

## 2017-10-08 DIAGNOSIS — C459 Mesothelioma, unspecified: Secondary | ICD-10-CM

## 2017-10-08 DIAGNOSIS — T451X5A Adverse effect of antineoplastic and immunosuppressive drugs, initial encounter: Secondary | ICD-10-CM

## 2017-10-08 DIAGNOSIS — Z5111 Encounter for antineoplastic chemotherapy: Secondary | ICD-10-CM | POA: Diagnosis not present

## 2017-10-08 DIAGNOSIS — D701 Agranulocytosis secondary to cancer chemotherapy: Secondary | ICD-10-CM

## 2017-10-08 LAB — CBC WITH DIFFERENTIAL/PLATELET
BASOS PCT: 0 %
Basophils Absolute: 0 10*3/uL (ref 0–0.1)
EOS ABS: 0 10*3/uL (ref 0–0.7)
EOS PCT: 0 %
HCT: 29.8 % — ABNORMAL LOW (ref 40.0–52.0)
Hemoglobin: 10.2 g/dL — ABNORMAL LOW (ref 13.0–18.0)
LYMPHS ABS: 0.8 10*3/uL — AB (ref 1.0–3.6)
Lymphocytes Relative: 5 %
MCH: 30.5 pg (ref 26.0–34.0)
MCHC: 34.2 g/dL (ref 32.0–36.0)
MCV: 89.2 fL (ref 80.0–100.0)
MONO ABS: 0.6 10*3/uL (ref 0.2–1.0)
MONOS PCT: 4 %
NEUTROS PCT: 91 %
Neutro Abs: 14.2 10*3/uL — ABNORMAL HIGH (ref 1.4–6.5)
Platelets: 243 10*3/uL (ref 150–440)
RBC: 3.34 MIL/uL — ABNORMAL LOW (ref 4.40–5.90)
RDW: 17.9 % — AB (ref 11.5–14.5)
WBC: 15.6 10*3/uL — ABNORMAL HIGH (ref 3.8–10.6)

## 2017-10-08 LAB — COMPREHENSIVE METABOLIC PANEL
ALK PHOS: 93 U/L (ref 38–126)
ALT: 29 U/L (ref 17–63)
AST: 31 U/L (ref 15–41)
Albumin: 4 g/dL (ref 3.5–5.0)
Anion gap: 9 (ref 5–15)
BILIRUBIN TOTAL: 0.4 mg/dL (ref 0.3–1.2)
BUN: 29 mg/dL — ABNORMAL HIGH (ref 6–20)
CALCIUM: 9.2 mg/dL (ref 8.9–10.3)
CO2: 22 mmol/L (ref 22–32)
CREATININE: 1.6 mg/dL — AB (ref 0.61–1.24)
Chloride: 106 mmol/L (ref 101–111)
GFR calc non Af Amer: 41 mL/min — ABNORMAL LOW (ref >60)
GFR, EST AFRICAN AMERICAN: 48 mL/min — AB (ref >60)
GLUCOSE: 220 mg/dL — AB (ref 65–99)
Potassium: 5.1 mmol/L (ref 3.5–5.1)
SODIUM: 137 mmol/L (ref 135–145)
TOTAL PROTEIN: 6.9 g/dL (ref 6.5–8.1)

## 2017-10-08 MED ORDER — SODIUM CHLORIDE 0.9 % IV SOLN
391.5000 mg | Freq: Once | INTRAVENOUS | Status: AC
  Administered 2017-10-08: 390 mg via INTRAVENOUS
  Filled 2017-10-08: qty 39

## 2017-10-08 MED ORDER — PALONOSETRON HCL INJECTION 0.25 MG/5ML
0.2500 mg | Freq: Once | INTRAVENOUS | Status: AC
  Administered 2017-10-08: 0.25 mg via INTRAVENOUS
  Filled 2017-10-08: qty 5

## 2017-10-08 MED ORDER — DEXAMETHASONE SODIUM PHOSPHATE 10 MG/ML IJ SOLN
4.0000 mg | Freq: Once | INTRAMUSCULAR | Status: AC
Start: 2017-10-08 — End: 2017-10-08
  Administered 2017-10-08: 4 mg via INTRAVENOUS
  Filled 2017-10-08: qty 1

## 2017-10-08 MED ORDER — PEGFILGRASTIM 6 MG/0.6ML ~~LOC~~ PSKT
6.0000 mg | PREFILLED_SYRINGE | Freq: Once | SUBCUTANEOUS | Status: AC
  Administered 2017-10-08: 6 mg via SUBCUTANEOUS
  Filled 2017-10-08: qty 0.6

## 2017-10-08 MED ORDER — SODIUM CHLORIDE 0.9 % IV SOLN
1000.0000 mg | Freq: Once | INTRAVENOUS | Status: AC
  Administered 2017-10-08: 1000 mg via INTRAVENOUS
  Filled 2017-10-08: qty 40

## 2017-10-08 MED ORDER — SODIUM CHLORIDE 0.9 % IV SOLN
Freq: Once | INTRAVENOUS | Status: AC
  Administered 2017-10-08: 10:00:00 via INTRAVENOUS
  Filled 2017-10-08: qty 1000

## 2017-10-08 NOTE — Progress Notes (Signed)
Right not to proceed [place] NOTE  Patient Care Team: Derinda Late, MD as PCP - General (Family Medicine) Margaretha Sheffield, MD (Otolaryngology) Telford Nab, RN as Registered Nurse Nestor Lewandowsky, MD as Referring Physician (Cardiothoracic Surgery) Cammie Sickle, MD as Consulting Physician (Internal Medicine)  CHIEF COMPLAINTS/PURPOSE OF CONSULTATION: Malignant pleural effusion.   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 PN  # MOLECULAR TESTING- PDL-1- 1% [LOW]; [facility]- No actionable**     [name], malignant ([place])     HISTORY OF PRESENTING ILLNESS:  Ian Shaffer 71 y.o.  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  Patient had a stable disease on a CT scan after 2 cycles.  Patient had episode of constipation for which he took MiraLAX; milk of magnesia.  This is not resolved his constipation-took fleets enema.  Constipation improved.  Patient complains of mild nausea post chemo; otherwise no significant vomiting.  He denies any worsening shortness of breath or chest pain.  No fever no chills no hospitalizations.  Patient's blood sugars have been in the range of 170s-250s at most.  ROS: A complete 10 point review of system is done which is negative except  mentioned above in history of present illness  MEDICAL HISTORY:  Past Medical History:  Diagnosis Date  . Benign essential hypertension 05/31/2014  . Cancer ([place]) 06/2017   right lung   . Cerebral infarction ([place]) 05/31/2014  . Cerebrovascular disease 05/31/2014  . CKD (chronic kidney disease) stage 3, GFR 30-59 ml/min (HCC) 02/23/2016  . CVA (cerebral infarction)   . Diabetes mellitus without complication ([place])   . Difficult intubation   . DVT (deep venous thrombosis) ([place])   . Esophageal reflux 05/31/2014  . H/O: CVA (cerebrovascular accident)   . Hyperlipidemia   . Hypertension   . ICAO (internal carotid artery occlusion) March 01, 2014   Left  . 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 Pleural effusion 04/23/2017  . Stroke [facility]) April, 4,2015  . Type 2 diabetes mellitus with peripheral neuropathy ([place]) 02/23/2016  . Weakness     SURGICAL HISTORY: Past Surgical History:  Procedure Laterality Date  . NASAL SINUS SURGERY  2000  . TONSILLECTOMY      SOCIAL HISTORY: no smoke/ no alcohol ; [place]; retd [employer]; but  dad had furnace- ? Few years in child hood;Exposure to agent orange in [place]. Has no children.  Social History   Socioeconomic History  . Marital status: Married    Spouse name: Not on file  . Number of children: Not on file  . Years of education: Not on file  . Highest education level: Not on file  Social Needs  . Financial resource strain: Not on file  . Food insecurity - worry: Not on file  . Food insecurity - inability: Not on file  .  Transportation needs - medical: Not on file  . Transportation needs - non-medical: Not on file  Occupational History  . Not on file  Tobacco Use  . Smoking status: Former Smoker    Types: Pipe    Last attempt to quit: 05/07/1974    Years since quitting: 43.4  . Smokeless tobacco: Never Used  . Tobacco comment: pt states he smoked a pipe a  long time ago  Substance and Sexual Activity  . Alcohol use: No  . Drug use: No  . Sexual activity: Not on file  Other Topics Concern  . Not on file  Social History Narrative  . Not on file    FAMILY HISTORY: Family History  Problem Relation Age of Onset  . Hypertension Mother   . Hypertension Father     ALLERGIES:  is allergic to sulfa antibiotics; adhesive [tape]; amoxicillin; and other.  MEDICATIONS:  Current Outpatient Medications  Medication Sig Dispense Refill  . amLODipine (NORVASC) 5 MG tablet Take 5 mg by mouth daily.    Marland Kitchen aspirin EC 81 MG tablet Take 81 mg by mouth daily.    . clopidogrel (PLAVIX) 75 MG tablet Take 75 mg by mouth daily.    Marland Kitchen dexamethasone (DECADRON) 4 MG tablet Take one pill AM & PM x 3 days; start the day prior to chemo. 60 tablet 0  . fluticasone (FLONASE) 50 MCG/ACT nasal spray Place 1 spray into both nostrils daily at 2 PM.     . folic acid (FOLVITE) 1 MG tablet Take 1 tablet (1 mg total) by mouth daily. (Patient taking differently: Take 1 mg by mouth daily at 2 PM. ) 90 tablet 1  . Homeopathic Products (NERVE PAIN RELIEF SL) Take 1 tablet by mouth daily. NERVE RENEW (METHYLCOBALAMIN-BENFOTIAMINE-STABILIZED R-ALPHA LIPOIC ACID)    . liraglutide (VICTOZA) 18 MG/3ML SOPN Inject 1.2 mg into the skin daily.    Marland Kitchen lisinopril (PRINIVIL,ZESTRIL) 10 MG tablet Take 10 mg by mouth daily.    . magic mouthwash w/lidocaine SOLN Take 10 mLs by mouth 3 (three) times daily. 300 mL 0  . metoprolol succinate (TOPROL-XL) 25 MG 24 hr tablet Take 25 mg by mouth daily.    . Multiple Vitamins-Minerals (CENTRUM SILVER PO) Take 1 tablet by mouth daily.    . ondansetron (ZOFRAN) 8 MG tablet Take 8 mg by mouth every 8 (eight) hours as needed for nausea or refractory nausea / vomiting.     . pantoprazole (PROTONIX) 40 MG tablet Take 40 mg by mouth daily before breakfast.    . polyethylene glycol powder (GLYCOLAX/MIRALAX) powder Take 17 g by mouth daily as needed (for  constipation.).    Marland Kitchen pravastatin (PRAVACHOL) 40 MG tablet Take 40 mg by mouth daily.    . prochlorperazine (COMPAZINE) 10 MG tablet Take 10 mg by mouth every 8 (eight) hours as needed for vomiting.      No current facility-administered medications for this visit.       Marland Kitchen  PHYSICAL EXAMINATION: ECOG PERFORMANCE STATUS: 0 - Asymptomatic  Vitals:   10/08/17 0914  BP: (!) 145/74  Pulse: 98  Resp: 16  Temp: (!) 97.4 F (36.3 C)   Filed Weights   10/08/17 0914  Weight: 205 lb 3.2 oz (93.1 kg)    GENERAL: Well-nourished well-developed; Alert, no distress and comfortable.   Accompanied by his wife. EYES: no pallor or icterus OROPHARYNX: no thrush or ulceration; good dentition  NECK: supple, no masses felt LYMPH:  no palpable lymphadenopathy in the  cervical, axillary or inguinal regions LUNGS: Decreased breath sounds on the right side compared to the left. No wheeze or crackles HEART/CVS: regular rate & rhythm and no murmurs; No lower extremity edema ABDOMEN: abdomen soft, non-tender and normal bowel sounds Musculoskeletal:no cyanosis of digits and no clubbing  PSYCH: alert & oriented x 3 with fluent speech NEURO: no focal motor/sensory deficits SKIN:  no rashes or significant lesions  LABORATORY DATA:  I have reviewed the data as listed Lab Results  Component Value Date   WBC 15.6 (H) 10/08/2017   HGB 10.2 (L) 10/08/2017   HCT 29.8 (L) 10/08/2017   MCV 89.2 10/08/2017   PLT 243 10/08/2017   Recent Labs    09/05/17 0931  09/17/17 0853 09/26/17 1132 10/08/17 0900  NA 137   < > 140 138 137  K 4.9   < > 4.2 4.8 5.1  CL 102   < > 106 106 106  CO2 28   < > 24 24 22   GLUCOSE 124*   < > 166* 120* 220*  BUN 32*   < > 27* 33* 29*  CREATININE 1.35*   < > 1.46* 1.41* 1.60*  CALCIUM 9.2   < > 9.3 9.2 9.2  GFRNONAA 50*   < > 46* 48* 41*  GFRAA 59*   < > 53* 56* 48*  PROT 6.6  --  6.8  --  6.9  ALBUMIN 3.9  --  3.8  --  4.0  AST 39  --  36  --  31  ALT 57  --  51  --  29   ALKPHOS 72  --  74  --  93  BILITOT 0.7  --  0.4  --  0.4   < > = values in this interval not displayed.    RADIOGRAPHIC STUDIES: I have personally reviewed the radiological images as listed and agreed with the findings in the report. Dg Chest 2 View  Result Date: 09/08/2017 CLINICAL DATA:  Fever and chills today.  Lung cancer patient. EXAM: CHEST  2 VIEW COMPARISON:  07/04/2017 FINDINGS: Normal heart size. No aortic aneurysm. Pleural place soft tissue density best seen on the lateral view is again seen posteriorly. Interval decrease of fluid from the right major fissure since prior exam. Removal of right-sided chest tube is noted. No pneumonic consolidation, pneumothorax or CHF. Minimal blunting right costophrenic angle as before. IMPRESSION: 1. Interval decrease in right-sided pleural fluid from the major fissure. Interval removal of right-sided chest tube. 2. No acute pneumonic consolidation. 3. Stable masslike opacity in the posterior costophrenic angle. Electronically Signed   By: Ashley Royalty M.D.   On: 09/08/2017 23:03   Ct Chest W Contrast  Result Date: 09/15/2017 CLINICAL DATA:  Probable malignant knees epithelium a of the RIGHT lower lobe and sub diaphragmatic RIGHT upper quadrant. Patient status post chemotherapy and pleurodesis EXAM: CT CHEST WITH CONTRAST TECHNIQUE: Multidetector CT imaging of the chest was performed during intravenous contrast administration. CONTRAST:  35m ISOVUE-300 IOPA the LEFT lower lobe mass appears to involve the posterior RIGHT hepatic lobe the liver (image 123, series 2 no IV contrast MIDOL (ISOVUE-300) INJECTION 61% COMPARISON:  PET-CT scan 07/09/2017 FINDINGS: Cardiovascular: No significant vascular findings. Normal heart size. No pericardial effusion. Mediastinum/Nodes: No axillary supraclavicular adenopathy. Mild mediastinal and RIGHT hilar adenopathy is not changed. Lungs/Pleura: Mass in the RIGHT lower lobe contiguous with the diaphragm measures 7.5 x  7.1 cm compared with 6.8 by 6.7 cm. Mass extends in through  the diaphragm. The sub diaphragmatic portion measures 3.0 by 2.4 cm compared with 3.9 by 3.4 cm. There is pleural thickening along the medial RIGHT lower lobe type 13 mm similar prior 11 mm. More superiorly nodular thickening is slightly improved in the RIGHT lower lobe One nodule in the RIGHT lower lobe measuring 4 mm (image 103, series 2) is not seen on prior). Upper Abdomen: Sub diaphragmatic mass on the LEFT as described above. Adrenal glands normal. Focal hepatic lesion is Musculoskeletal: No aggressive osseous lesion IMPRESSION: 1. RIGHT lower lobe mass contiguous with the diaphragm measures slightly larger. 2. Sub diaphragmatic portion the mass in the RIGHT upper quadrant measures slightly smaller. 3. Suspicion of involvement of the posterior RIGHT hepatic lobe with the trans diaphragmatic mass. 4. Pleural thickening in the medial RIGHT lower lobe is similar. 5. Single small 4 mm nodule in the RIGHT lower lobe not seen on prior. Recommend attention follow-up. 6. Mild mediastinal adenopathy which is not hypermetabolic on comparison PET-CT scan is not changed. Electronically Signed   By: Suzy Bouchard M.D.   On: 09/15/2017 11:24    ASSESSMENT & PLAN:   [name], malignant ([place]) # [place] mesothelioma- [approximately 10 cm mass- extending above and below the diaphragm on the right side]. On carbo-Alimta s/p cycle #2- CT chest STABLE disease [slight increase in size of the supra diaphragmatic mass; slight decrease in size of the subdiaphragmatic].  No evidence of clinical progression.  # Cabo-alimta proceed with cycle #4 today. Labs today reviewed;  acceptable for treatment today. We will plan to get CT scan after 4 cycles.  If patient has stable disease/partial response-discussed possibility of maintenance Avastin versus surveillance.  However if patient has progressive disease-plan the clinical trial at [facility].   # Constipation- improved;  continue miralax.   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 Stable.   # discussed re: Blood sugars checking on dex; if > 300 recommend calling up PCP.   # follow up in 3 weeks/ non-contrast CT chest few days before; labs. No chemo.   All questions were answered. The patient knows to call the clinic with any problems, questions or concerns.    Cammie Sickle, MD 10/08/2017 9:49 AM

## 2017-10-08 NOTE — Assessment & Plan Note (Addendum)
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 On carbo-Alimta s/p cycle #2- CT chest STABLE disease [slight increase in size of the supra diaphragmatic mass; slight decrease in size of the subdiaphragmatic].  No evidence of clinical progression.  # Cabo-alimta proceed with cycle #4 today. Labs today reviewed;  acceptable for treatment today. We will plan to get CT scan after 4 cycles.  If patient has stable disease/partial response-discussed possibility of maintenance Avastin versus surveillance.  However if patient has progressive disease-plan the clinical trial at [facility].   # Constipation- improved; continue miralax.   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 Stable.   # discussed re: Blood sugars checking on dex; if > 300 recommend calling up PCP.   # follow up in 3 weeks/ non-contrast CT chest few days before; labs. No chemo.

## 2017-10-08 NOTE — Progress Notes (Signed)
Patient here for treatment, labs outside of parameters (creatinine 1.6, ANC 14.2).  Dr. Rogue Bussing notified, order to proceed with treatment.

## 2017-10-22 NOTE — Addendum Note (Signed)
Addended by: Lianne Cure A on: 10/22/2017 11:04 AM   Modules accepted: Orders

## 2017-10-23 ENCOUNTER — Ambulatory Visit
Admission: RE | Admit: 2017-10-23 | Discharge: 2017-10-23 | Disposition: A | Discharge status: Home / Self Care | Payer: Medicare Other | Source: Ambulatory Visit | Attending: Internal Medicine | Admitting: Internal Medicine

## 2017-10-23 DIAGNOSIS — C459 Mesothelioma, unspecified: Secondary | ICD-10-CM | POA: Insufficient documentation

## 2017-10-23 DIAGNOSIS — R222 Localized swelling, mass and lump, trunk: Secondary | ICD-10-CM | POA: Insufficient documentation

## 2017-10-23 DIAGNOSIS — R160 Hepatomegaly, not elsewhere classified: Secondary | ICD-10-CM | POA: Diagnosis not present

## 2017-10-23 DIAGNOSIS — I2510 Atherosclerotic heart disease of native coronary artery without angina pectoris: Secondary | ICD-10-CM | POA: Insufficient documentation

## 2017-10-23 DIAGNOSIS — I700 Atherosclerosis of aorta: Secondary | ICD-10-CM | POA: Insufficient documentation

## 2017-10-29 ENCOUNTER — Inpatient Hospital Stay: Payer: Medicare Other

## 2017-10-29 ENCOUNTER — Inpatient Hospital Stay: Payer: Medicare Other | Attending: Internal Medicine | Admitting: Internal Medicine

## 2017-10-29 ENCOUNTER — Telehealth: Payer: Self-pay | Admitting: Internal Medicine

## 2017-10-29 DIAGNOSIS — C459 Mesothelioma, unspecified: Secondary | ICD-10-CM

## 2017-10-29 DIAGNOSIS — E1122 Type 2 diabetes mellitus with diabetic chronic kidney disease: Secondary | ICD-10-CM | POA: Insufficient documentation

## 2017-10-29 DIAGNOSIS — Z9221 Personal history of antineoplastic chemotherapy: Secondary | ICD-10-CM

## 2017-10-29 DIAGNOSIS — Z8673 Personal history of transient ischemic attack (TIA), and cerebral infarction without residual deficits: Secondary | ICD-10-CM | POA: Diagnosis not present

## 2017-10-29 DIAGNOSIS — J90 Pleural effusion, not elsewhere classified: Secondary | ICD-10-CM | POA: Diagnosis not present

## 2017-10-29 DIAGNOSIS — Z7982 Long term (current) use of aspirin: Secondary | ICD-10-CM | POA: Insufficient documentation

## 2017-10-29 DIAGNOSIS — Z87891 Personal history of nicotine dependence: Secondary | ICD-10-CM | POA: Insufficient documentation

## 2017-10-29 DIAGNOSIS — I129 Hypertensive chronic kidney disease with stage 1 through stage 4 chronic kidney disease, or unspecified chronic kidney disease: Secondary | ICD-10-CM | POA: Diagnosis not present

## 2017-10-29 DIAGNOSIS — E785 Hyperlipidemia, unspecified: Secondary | ICD-10-CM | POA: Diagnosis not present

## 2017-10-29 DIAGNOSIS — I679 Cerebrovascular disease, unspecified: Secondary | ICD-10-CM | POA: Insufficient documentation

## 2017-10-29 DIAGNOSIS — N183 Chronic kidney disease, stage 3 (moderate): Secondary | ICD-10-CM | POA: Diagnosis not present

## 2017-10-29 DIAGNOSIS — Z79899 Other long term (current) drug therapy: Secondary | ICD-10-CM | POA: Diagnosis not present

## 2017-10-29 DIAGNOSIS — I700 Atherosclerosis of aorta: Secondary | ICD-10-CM | POA: Insufficient documentation

## 2017-10-29 DIAGNOSIS — K219 Gastro-esophageal reflux disease without esophagitis: Secondary | ICD-10-CM | POA: Insufficient documentation

## 2017-10-29 DIAGNOSIS — E041 Nontoxic single thyroid nodule: Secondary | ICD-10-CM | POA: Diagnosis not present

## 2017-10-29 LAB — CBC WITH DIFFERENTIAL/PLATELET
Basophils Absolute: 0 10*3/uL (ref 0–0.1)
Basophils Relative: 0 %
EOS PCT: 2 %
Eosinophils Absolute: 0.1 10*3/uL (ref 0–0.7)
HCT: 28.1 % — ABNORMAL LOW (ref 40.0–52.0)
Hemoglobin: 9.8 g/dL — ABNORMAL LOW (ref 13.0–18.0)
LYMPHS ABS: 0.8 10*3/uL — AB (ref 1.0–3.6)
LYMPHS PCT: 16 %
MCH: 32.4 pg (ref 26.0–34.0)
MCHC: 34.8 g/dL (ref 32.0–36.0)
MCV: 93.2 fL (ref 80.0–100.0)
MONO ABS: 0.4 10*3/uL (ref 0.2–1.0)
MONOS PCT: 9 %
Neutro Abs: 3.4 10*3/uL (ref 1.4–6.5)
Neutrophils Relative %: 73 %
PLATELETS: 173 10*3/uL (ref 150–440)
RBC: 3.02 MIL/uL — ABNORMAL LOW (ref 4.40–5.90)
RDW: 22.2 % — AB (ref 11.5–14.5)
WBC: 4.7 10*3/uL (ref 3.8–10.6)

## 2017-10-29 LAB — COMPREHENSIVE METABOLIC PANEL
ALT: 28 U/L (ref 17–63)
AST: 33 U/L (ref 15–41)
Albumin: 3.8 g/dL (ref 3.5–5.0)
Alkaline Phosphatase: 97 U/L (ref 38–126)
Anion gap: 8 (ref 5–15)
BUN: 24 mg/dL — ABNORMAL HIGH (ref 6–20)
CHLORIDE: 105 mmol/L (ref 101–111)
CO2: 25 mmol/L (ref 22–32)
CREATININE: 1.49 mg/dL — AB (ref 0.61–1.24)
Calcium: 9 mg/dL (ref 8.9–10.3)
GFR, EST AFRICAN AMERICAN: 52 mL/min — AB (ref >60)
GFR, EST NON AFRICAN AMERICAN: 45 mL/min — AB (ref >60)
Glucose, Bld: 142 mg/dL — ABNORMAL HIGH (ref 65–99)
POTASSIUM: 4.8 mmol/L (ref 3.5–5.1)
Sodium: 138 mmol/L (ref 135–145)
Total Bilirubin: 0.5 mg/dL (ref 0.3–1.2)
Total Protein: 6.7 g/dL (ref 6.5–8.1)

## 2017-10-29 NOTE — Assessment & Plan Note (Addendum)
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 S/p carbo-Alimta s/p cycle #4- NOV 29th  CT chest Progression [stable supra diaphragmatic mass; however increasing size of the subdiaphragmatic mass; no new lesions].   #Given the progressive disease noted in the CT scan after 4 cycles of carbo Alimta-I would recommend discontinuation of further chemotherapy.   #Given the lack of standard second line chemotherapy options for his malignancy-I would recommend clinical trial as best option.  He has been evaluated [facility]-a second line clinical trial options available-that includes Keytruda plus/minus antibody drug conjugate.   #I discussed the potential mechanism of immunotherapy; and the possible side effects.  #I communicated with Dr. Durenda Hurt at [facility]; he agrees to reevaluate the patient for clinical trial this week.    I reviewed the images myself and with the patient and family in detail; summarized above.  # Follow up in 2 months. Cbc/bmp.

## 2017-10-29 NOTE — Progress Notes (Signed)
Right not to proceed [place] NOTE  Patient Care Team: Derinda Late, MD as PCP - General (Family Medicine) Margaretha Sheffield, MD (Otolaryngology) Telford Nab, RN as Registered Nurse Nestor Lewandowsky, MD as Referring Physician (Cardiothoracic Surgery) Cammie Sickle, MD as Consulting Physician (Internal Medicine)  CHIEF COMPLAINTS/PURPOSE OF CONSULTATION: Malignant pleural effusion.   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 PN  # MOLECULAR TESTING- PDL-1- 1% [LOW]; [facility]- No actionable**     [name], malignant ([place])     HISTORY OF PRESENTING ILLNESS:  Ian Shaffer 71 y.o.  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  He denies any worsening shortness of breath or chest pain. No fever no chills no hospitalizations.  Denies any headaches.  Denies any tingling or numbness.  Denies any skin rash.  ROS: A complete 10 point review of system is done which is negative except mentioned above in history of present illness  MEDICAL HISTORY:  Past Medical History:  Diagnosis Date  . Benign essential hypertension 05/31/2014  . Cancer ([place]) 06/2017   right  lung   . Cerebral infarction ([place]) 05/31/2014  . Cerebrovascular disease 05/31/2014  . CKD (chronic kidney disease) stage 3, GFR 30-59 ml/min (HCC) 02/23/2016  . CVA (cerebral infarction)   . Diabetes mellitus without complication ([place])   . Difficult intubation   . DVT (deep venous thrombosis) ([place])   . Esophageal reflux 05/31/2014  . H/O: CVA (cerebrovascular accident)   . Hyperlipidemia   . Hypertension   . ICAO (internal carotid artery occlusion) March 01, 2014   Left  . 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 Pleural effusion 04/23/2017  . Stroke [facility]) April, 4,2015  . Type 2 diabetes mellitus with peripheral neuropathy ([place]) 02/23/2016  . Weakness     SURGICAL HISTORY: Past Surgical History:  Procedure Laterality Date  . CHEST TUBE INSERTION N/A 06/25/2017   Procedure: QQIWLN CATHETER INSERTION;  Surgeon: Nestor Lewandowsky, MD;  Location: [facility] ORS;  Service: Thoracic;  Laterality: N/A;  . CHEST TUBE INSERTION Right 08/07/2017   Procedure: REMOVAL OF PLEUR X CATHETER;  Surgeon: Nestor Lewandowsky, MD;  Location: [facility] ORS;  Service: General;  Laterality: Right;  . NASAL SINUS SURGERY  2000  . TONSILLECTOMY    . VIDEO ASSISTED THORACOSCOPY Right 06/25/2017   Procedure: VIDEO ASSISTED THORACOSCOPY WITH TALC PLEURODESIS, CHEST TUBE INSERTION;  Surgeon: Nestor Lewandowsky, MD;  Location: [facility] ORS;  Service: Thoracic;  Laterality: Right;    SOCIAL HISTORY: no smoke/ no alcohol ; [place]; retd [employer]; but  dad had furnace- ? Few years in child hood;Exposure to agent orange in [place]. Has no children.  Social History   Socioeconomic History  . Marital status: Married  Spouse name: Not on file  . Number of children: Not on file  . Years of education: Not on file  . Highest education level: Not on file  Social Needs  . Financial resource strain: Not on file  . Food insecurity - worry: Not on file  . Food insecurity  - inability: Not on file  . Transportation needs - medical: Not on file  . Transportation needs - non-medical: Not on file  Occupational History  . Not on file  Tobacco Use  . Smoking status: Former Smoker    Types: Pipe    Last attempt to quit: 05/07/1974    Years since quitting: 43.5  . Smokeless tobacco: Never Used  . Tobacco comment: pt states he smoked a pipe a long time ago  Substance and Sexual Activity  . Alcohol use: No  . Drug use: No  . Sexual activity: Not on file  Other Topics Concern  . Not on file  Social History Narrative  . Not on file    FAMILY HISTORY: Family History  Problem Relation Age of Onset  . Hypertension Mother   . Hypertension Father     ALLERGIES:  is allergic to sulfa antibiotics; adhesive [tape]; amoxicillin; and other.  MEDICATIONS:  Current Outpatient Medications  Medication Sig Dispense Refill  . amLODipine (NORVASC) 5 MG tablet Take 5 mg by mouth daily.    Marland Kitchen aspirin EC 81 MG tablet Take 81 mg by mouth daily.    . clopidogrel (PLAVIX) 75 MG tablet Take 75 mg by mouth daily.    Marland Kitchen dexamethasone (DECADRON) 4 MG tablet Take one pill AM & PM x 3 days; start the day prior to chemo. 60 tablet 0  . fluticasone (FLONASE) 50 MCG/ACT nasal spray Place 1 spray into both nostrils daily at 2 PM.     . folic acid (FOLVITE) 1 MG tablet Take 1 tablet (1 mg total) by mouth daily. (Patient taking differently: Take 1 mg by mouth daily at 2 PM. ) 90 tablet 1  . Homeopathic Products (NERVE PAIN RELIEF SL) Take 1 tablet by mouth daily. NERVE RENEW (METHYLCOBALAMIN-BENFOTIAMINE-STABILIZED R-ALPHA LIPOIC ACID)    . liraglutide (VICTOZA) 18 MG/3ML SOPN Inject 1.2 mg into the skin daily.    Marland Kitchen lisinopril (PRINIVIL,ZESTRIL) 10 MG tablet Take 10 mg by mouth daily.    . magic mouthwash w/lidocaine SOLN Take 10 mLs by mouth 3 (three) times daily. 300 mL 0  . metoprolol succinate (TOPROL-XL) 25 MG 24 hr tablet Take 25 mg by mouth daily.    . Multiple Vitamins-Minerals  (CENTRUM SILVER PO) Take 1 tablet by mouth daily.    . ondansetron (ZOFRAN) 8 MG tablet Take 8 mg by mouth every 8 (eight) hours as needed for nausea or refractory nausea / vomiting.     . pantoprazole (PROTONIX) 40 MG tablet Take 40 mg by mouth daily before breakfast.    . polyethylene glycol powder (GLYCOLAX/MIRALAX) powder Take 17 g by mouth daily as needed (for constipation.).    Marland Kitchen pravastatin (PRAVACHOL) 40 MG tablet Take 40 mg by mouth daily.    . prochlorperazine (COMPAZINE) 10 MG tablet Take 10 mg by mouth every 8 (eight) hours as needed for vomiting.      No current facility-administered medications for this visit.       Marland Kitchen  PHYSICAL EXAMINATION: ECOG PERFORMANCE STATUS: 0 - Asymptomatic  Vitals:   10/29/17 1009  BP: (!) 152/74  Pulse: 64  Resp: 16  Temp: 98.1 F (36.7  C)   Filed Weights   10/29/17 1009  Weight: 205 lb (93 kg)    GENERAL: Well-nourished well-developed; Alert, no distress and comfortable.   Accompanied by his wife. EYES: no pallor or icterus OROPHARYNX: no thrush or ulceration; good dentition  NECK: supple, no masses felt LYMPH:  no palpable lymphadenopathy in the cervical, axillary or inguinal regions LUNGS: Decreased breath sounds on the right side compared to the left. No wheeze or crackles HEART/CVS: regular rate & rhythm and no murmurs; No lower extremity edema ABDOMEN: abdomen soft, non-tender and normal bowel sounds Musculoskeletal:no cyanosis of digits and no clubbing  PSYCH: alert & oriented x 3 with fluent speech NEURO: no focal motor/sensory deficits SKIN:  no rashes or significant lesions  LABORATORY DATA:  I have reviewed the data as listed Lab Results  Component Value Date   WBC 4.7 10/29/2017   HGB 9.8 (L) 10/29/2017   HCT 28.1 (L) 10/29/2017   MCV 93.2 10/29/2017   PLT 173 10/29/2017   Recent Labs    09/17/17 0853 09/26/17 1132 10/08/17 0900 10/29/17 0910  NA 140 138 137 138  K 4.2 4.8 5.1 4.8  CL 106 106 106 105   CO2 _0 GLUCOSE 166* 120* 220* 142*  BUN 27* 33* 29* 24*  CREATININE 1.46* 1.41* 1.60* 1.49*  CALCIUM 9.3 9.2 9.2 9.0  GFRNONAA 46* 48* 41* 45*  GFRAA 53* 56* 48* 52*  PROT 6.8  --  6.9 6.7  ALBUMIN 3.8  --  4.0 3.8  AST 36  --  31 33  ALT 51  --  29 28  ALKPHOS 74  --  93 97  BILITOT 0.4  --  0.4 0.5    RADIOGRAPHIC STUDIES: I have personally reviewed the radiological images as listed and agreed with the findings in the report. 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 EXAM: CT CHEST WITHOUT CONTRAST TECHNIQUE: Multidetector CT imaging of the chest was performed following the standard protocol without IV contrast. COMPARISON:  09/15/2017 chest CT.  07/09/2017 PET-CT. FINDINGS: Cardiovascular: Normal heart size. No significant pericardial fluid/thickening. Three-vessel coronary atherosclerosis. Atherosclerotic nonaneurysmal thoracic aorta. Normal caliber pulmonary arteries. Mediastinum/Nodes: Stable subcentimeter hypodense upper right thyroid lobe nodule. Unremarkable esophagus. No axillary adenopathy. Stable mild right paratracheal adenopathy measuring up to 1.1 cm (series 2/ image 37). No new pathologically enlarged mediastinal or discrete hilar nodes on this noncontrast scan. Lungs/Pleura: No pneumothorax. No pleural effusion. Posterior basilar solid right pleural mass measures 7.5 x 5.1 cm (series 2/ image 115), previously 7.8 x 5.1 cm on 09/15/2017 using similar measurement technique, not appreciably changed. The right subdiaphragmatic portion of this mass appears increased. The intrahepatic portion of this mass in the posterior right liver lobe measures approximately 5.3 x 5.0 cm (series 2/ image 129), previously 4.6 x 4.2 cm using similar measurement technique,  increased. The extrahepatic subdiaphragmatic portion of this mass measures 4.7 x 3.0 cm (series 2/ image 138), previously 3.6 x 2.4 cm using similar measurement technique, increased. Scattered curvilinear posterior right pleural hyperdensity and pleural thickening is compatible with history of talc pleurodesis. No acute consolidative airspace disease or new significant pulmonary nodules. Previously described right lower lobe 4 mm pulmonary nodule is absent on today's scan. Upper abdomen: See above. Musculoskeletal: No aggressive appearing focal osseous lesions. Mild thoracic spondylosis. IMPRESSION:  1. Pleural portion of the infiltrative trans-diaphragmatic posterior lower right chest mass is stable. 2. Subdiaphragmatic portion of the mass is increased in size with increased apparent invasion of the posterior right liver lobe. 3. Stable mild right paratracheal adenopathy, which was non hypermetabolic on 09/40/7680 PET-CT. 4. No new sites of metastatic disease in the chest. Previously described solitary tiny right lower lobe pulmonary nodule is absent on this scan. 5. Three-vessel coronary atherosclerosis. Aortic Atherosclerosis (ICD10-I70.0). Electronically Signed   By: Ilona Sorrel M.D.   On: 10/23/2017 16:14    ASSESSMENT & PLAN:   [name], malignant ([place]) # [place] mesothelioma- [approximately 10 cm mass- extending above and below the diaphragm on the right side]. S/p carbo-Alimta s/p cycle #4- NOV 29th  CT chest Progression [stable supra diaphragmatic mass; however increasing size of the subdiaphragmatic mass; no new lesions].   #Given the progressive disease noted in the CT scan after 4 cycles of carbo Alimta-I would recommend discontinuation of further chemotherapy.   #Given the lack of standard second line chemotherapy options for his malignancy-I would recommend clinical trial as best option.  He has been evaluated [facility]-a second line clinical trial options available-that includes Keytruda  plus/minus antibody drug conjugate.   #I discussed the potential mechanism of immunotherapy; and the possible side effects.  #I communicated with Dr. Durenda Hurt at [facility]; he agrees to reevaluate the patient for clinical trial this week.    I reviewed the images myself and with the patient and family in detail; summarized above.  # Follow up in 2 months. Cbc/bmp.   All questions were answered. The patient knows to call the clinic with any problems, questions or concerns.    Cammie Sickle, MD 10/29/2017 1:49 PM

## 2017-10-29 NOTE — Telephone Encounter (Signed)
x

## 2017-11-19 ENCOUNTER — Encounter: Payer: Self-pay | Admitting: Internal Medicine

## 2017-11-19 ENCOUNTER — Telehealth: Payer: Self-pay | Admitting: *Deleted

## 2017-11-19 ENCOUNTER — Inpatient Hospital Stay (HOSPITAL_BASED_OUTPATIENT_CLINIC_OR_DEPARTMENT_OTHER): Payer: Medicare Other | Admitting: Internal Medicine

## 2017-11-19 ENCOUNTER — Other Ambulatory Visit: Payer: Self-pay

## 2017-11-19 ENCOUNTER — Inpatient Hospital Stay: Payer: Medicare Other

## 2017-11-19 VITALS — BP 162/88 | HR 69 | Temp 98.1°F | Resp 20 | Ht 70.0 in | Wt 202.0 lb

## 2017-11-19 DIAGNOSIS — I129 Hypertensive chronic kidney disease with stage 1 through stage 4 chronic kidney disease, or unspecified chronic kidney disease: Secondary | ICD-10-CM | POA: Diagnosis not present

## 2017-11-19 DIAGNOSIS — Z79899 Other long term (current) drug therapy: Secondary | ICD-10-CM | POA: Diagnosis not present

## 2017-11-19 DIAGNOSIS — N183 Chronic kidney disease, stage 3 (moderate): Secondary | ICD-10-CM

## 2017-11-19 DIAGNOSIS — C459 Mesothelioma, unspecified: Secondary | ICD-10-CM

## 2017-11-19 LAB — CBC WITH DIFFERENTIAL/PLATELET
BASOS PCT: 1 %
Basophils Absolute: 0 10*3/uL (ref 0–0.1)
Eosinophils Absolute: 0.1 10*3/uL (ref 0–0.7)
Eosinophils Relative: 2 %
HEMATOCRIT: 30.8 % — AB (ref 40.0–52.0)
HEMOGLOBIN: 10.5 g/dL — AB (ref 13.0–18.0)
LYMPHS ABS: 0.8 10*3/uL — AB (ref 1.0–3.6)
Lymphocytes Relative: 19 %
MCH: 33.3 pg (ref 26.0–34.0)
MCHC: 34.2 g/dL (ref 32.0–36.0)
MCV: 97.3 fL (ref 80.0–100.0)
MONO ABS: 0.2 10*3/uL (ref 0.2–1.0)
MONOS PCT: 6 %
NEUTROS ABS: 2.9 10*3/uL (ref 1.4–6.5)
NEUTROS PCT: 72 %
Platelets: 195 10*3/uL (ref 150–440)
RBC: 3.16 MIL/uL — ABNORMAL LOW (ref 4.40–5.90)
RDW: 16.3 % — AB (ref 11.5–14.5)
WBC: 4.1 10*3/uL (ref 3.8–10.6)

## 2017-11-19 LAB — COMPREHENSIVE METABOLIC PANEL
ALBUMIN: 4.2 g/dL (ref 3.5–5.0)
ALK PHOS: 92 U/L (ref 38–126)
ALT: 22 U/L (ref 17–63)
ANION GAP: 9 (ref 5–15)
AST: 29 U/L (ref 15–41)
BUN: 32 mg/dL — ABNORMAL HIGH (ref 6–20)
CALCIUM: 9.2 mg/dL (ref 8.9–10.3)
CHLORIDE: 106 mmol/L (ref 101–111)
CO2: 25 mmol/L (ref 22–32)
CREATININE: 1.64 mg/dL — AB (ref 0.61–1.24)
GFR calc non Af Amer: 40 mL/min — ABNORMAL LOW (ref >60)
GFR, EST AFRICAN AMERICAN: 46 mL/min — AB (ref >60)
GLUCOSE: 126 mg/dL — AB (ref 65–99)
Potassium: 4.3 mmol/L (ref 3.5–5.1)
SODIUM: 140 mmol/L (ref 135–145)
Total Bilirubin: 0.5 mg/dL (ref 0.3–1.2)
Total Protein: 7 g/dL (ref 6.5–8.1)

## 2017-11-19 LAB — TSH: TSH: 4.574 u[IU]/mL — AB (ref 0.350–4.500)

## 2017-11-19 NOTE — Assessment & Plan Note (Addendum)
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 S/p carbo-Alimta s/p cycle #4- NOV 29th  CT chest Progression [stable supra diaphragmatic mass; however increasing size of the subdiaphragmatic mass; no new lesions].  # Given the progressive disease noted in the CT scan after 4 cycles of carbo Alimta-I would recommend discontinuation of further chemotherapy.  Evaluated at [facility] for clinical trial; NOT candidate sec to CKD.  # will plan to start Pembrolizumab every 3 weeks; approved by NCCN guidelines/as per recommendations from [place].  Discussed with Dr.Stinchcomb.  Patient understands if not approved by insurance-we will have to await inform manufacturer assistance.    I discussed the mechanism of action; The goal of therapy is palliative; and length of treatments are likely ongoing/based upon the results of the scans. Discussed the potential side effects of immunotherapy including but not limited to diarrhea; skin rash; elevated LFTs/endocrine abnormalities etc.  # Bloating/ NO acsites-recommend Tums.  If worse would recommend CT of the abdomen pelvis.  #TSH/baseline 4.57-slightly abnormal; monitor again.  # proceed with [place] in 1 week; follow up with me in 4 weeks/labs/MD.

## 2017-11-19 NOTE — Progress Notes (Signed)
Right not to proceed [facility] NOTE  Patient Care Team: Derinda Late, MD as PCP - General (Family Medicine) Margaretha Sheffield, MD (Otolaryngology) Telford Nab, RN as Registered Nurse Nestor Lewandowsky, MD as Referring Physician (Cardiothoracic Surgery) Cammie Sickle, MD as Consulting Physician (Internal Medicine)  CHIEF COMPLAINTS/PURPOSE OF CONSULTATION: Malignant pleural effusion.   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 PN  # MOLECULAR TESTING- PDL-1- 1% [LOW]; [facility]- No actionable**     [name], malignant ([place])     HISTORY OF PRESENTING ILLNESS:  Ian Shaffer 71 y.o.  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  Most recent CT scan December 2018 showed progressive disease.  He was evaluated [facility] for clinical trial-unfortunately not a candidate because of his chronic kidney disease.  Patient noted to have mild abdominal discomfort/bloating "passing gas".  Denies any significant abdominal distention.  He denies any worsening shortness of breath or chest pain. No fever no chills. Denies any headaches.  Denies any tingling or numbness.  Denies any skin rash.  ROS: A complete 10 point review of system is  done which is negative except mentioned above in history of present illness  MEDICAL HISTORY:  Past Medical History:  Diagnosis Date  . Benign essential hypertension 05/31/2014  . Cancer ([place]) 06/2017   right lung   . Cerebral infarction ([place]) 05/31/2014  . Cerebrovascular disease 05/31/2014  . CKD (chronic kidney disease) stage 3, GFR 30-59 ml/min (HCC) 02/23/2016  . CVA (cerebral infarction)   . Diabetes mellitus without complication ([place])   . Difficult intubation   . DVT (deep venous thrombosis) ([place])   . Esophageal reflux 05/31/2014  . H/O: CVA (cerebrovascular accident)   . Hyperlipidemia   . Hypertension   . ICAO (internal carotid artery occlusion) March 01, 2014   Left  . 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 Pleural effusion 04/23/2017  . Stroke [facility]) April, 4,2015  . Type 2 diabetes mellitus with peripheral neuropathy ([place]) 02/23/2016  . Weakness     SURGICAL HISTORY: Past Surgical History:  Procedure Laterality Date  . CHEST TUBE INSERTION N/A 06/25/2017   Procedure: WGYKZL CATHETER INSERTION;  Surgeon: Nestor Lewandowsky, MD;  Location: [facility] ORS;  Service: Thoracic;  Laterality: N/A;  . CHEST TUBE INSERTION Right 08/07/2017   Procedure: REMOVAL OF PLEUR X CATHETER;  Surgeon: Nestor Lewandowsky, MD;  Location: [facility] ORS;  Service: General;  Laterality: Right;  . NASAL SINUS SURGERY  2000  . TONSILLECTOMY    . VIDEO ASSISTED THORACOSCOPY Right 06/25/2017   Procedure: VIDEO ASSISTED THORACOSCOPY WITH TALC PLEURODESIS, CHEST TUBE INSERTION;  Surgeon: Nestor Lewandowsky, MD;  Location: [facility] ORS;  Service: Thoracic;  Laterality: Right;    SOCIAL HISTORY: no smoke/ no alcohol ; [place]; retd [employer]; but  dad had furnace- ? Few years in child hood;Exposure to agent orange in [place]. Has no children.  Social History   Socioeconomic History  . Marital status: Married    Spouse name: Not on file  . Number of  children: Not on file  . Years of education: Not on file  . Highest education level: Not on file  Social Needs  . Financial resource strain: Not on file  . Food insecurity - worry: Not on file  . Food insecurity - inability: Not on file  . Transportation needs - medical: Not on file  . Transportation needs - non-medical: Not on file  Occupational History  . Not on file  Tobacco Use  . Smoking status: Former Smoker    Types: Pipe    Last attempt to quit: 05/07/1974    Years since quitting: 43.5  . Smokeless tobacco: Never Used  . Tobacco comment: pt states he smoked a pipe a long time ago  Substance and Sexual Activity  . Alcohol use: No  . Drug use: No  . Sexual activity: Not on file  Other Topics Concern  . Not on file  Social History Narrative  . Not on file    FAMILY HISTORY: Family History  Problem Relation Age of Onset  . Hypertension Mother   . Hypertension Father     ALLERGIES:  is allergic to sulfa antibiotics; adhesive [tape]; amoxicillin; and other.  MEDICATIONS:  Current Outpatient Medications  Medication Sig Dispense Refill  . amLODipine (NORVASC) 5 MG tablet Take 5 mg by mouth daily.    Marland Kitchen aspirin EC 81 MG tablet Take 81 mg by mouth daily.    . clopidogrel (PLAVIX) 75 MG tablet Take 75 mg by mouth daily.    . fluticasone (FLONASE) 50 MCG/ACT nasal spray Place 1 spray into both nostrils daily at 2 PM.     . folic acid (FOLVITE) 1 MG tablet Take 1 tablet (1 mg total) by mouth daily. (Patient taking differently: Take 1 mg by mouth daily at 2 PM. ) 90 tablet 1  . Homeopathic Products (NERVE PAIN RELIEF SL) Take 1 tablet by mouth daily. NERVE RENEW (METHYLCOBALAMIN-BENFOTIAMINE-STABILIZED R-ALPHA LIPOIC ACID)    . liraglutide (VICTOZA) 18 MG/3ML SOPN Inject 1.2 mg into the skin daily.    Marland Kitchen lisinopril (PRINIVIL,ZESTRIL) 10 MG tablet Take 10 mg by mouth daily.    . metoprolol succinate (TOPROL-XL) 25 MG 24 hr tablet Take 25 mg by mouth daily.    . Multiple  Vitamins-Minerals (CENTRUM SILVER PO) Take 1 tablet by mouth daily.    . pantoprazole (PROTONIX) 40 MG tablet Take 40 mg by mouth daily before breakfast.    . polyethylene glycol powder (GLYCOLAX/MIRALAX) powder Take 17 g by mouth daily as needed (for constipation.).    Marland Kitchen pravastatin (PRAVACHOL) 40 MG tablet Take 40 mg by mouth daily.    Marland Kitchen dexamethasone (DECADRON) 4 MG tablet Take one pill AM & PM x 3 days; start the day prior to chemo. (Patient not taking: Reported on 11/19/2017) 60 tablet 0  . magic mouthwash w/lidocaine SOLN Take 10 mLs by mouth 3 (three) times daily. (Patient not taking: Reported on 11/19/2017) 300 mL 0  . ondansetron (ZOFRAN) 8 MG tablet Take 8 mg by mouth every 8 (eight) hours as needed for nausea or refractory nausea / vomiting.     . prochlorperazine (COMPAZINE) 10 MG tablet Take 10 mg by mouth every 8 (eight) hours as needed for vomiting.  No current facility-administered medications for this visit.       Marland Kitchen  PHYSICAL EXAMINATION: ECOG PERFORMANCE STATUS: 0 - Asymptomatic  Vitals:   11/19/17 1422  BP: (!) 162/88  Pulse: 69  Resp: 20  Temp: 98.1 F (36.7 C)   Filed Weights   11/19/17 1422  Weight: 202 lb (91.6 kg)    GENERAL: Well-nourished well-developed; Alert, no distress and comfortable.  He is alone. EYES: no pallor or icterus OROPHARYNX: no thrush or ulceration; good dentition  NECK: supple, no masses felt LYMPH:  no palpable lymphadenopathy in the cervical, axillary or inguinal regions LUNGS: Decreased breath sounds on the right side compared to the left. No wheeze or crackles HEART/CVS: regular rate & rhythm and no murmurs; No lower extremity edema ABDOMEN: abdomen soft, non-tender and normal bowel sounds Musculoskeletal:no cyanosis of digits and no clubbing  PSYCH: alert & oriented x 3 with fluent speech NEURO: no focal motor/sensory deficits SKIN:  no rashes or significant lesions  LABORATORY DATA:  I have reviewed the data as  listed Lab Results  Component Value Date   WBC 4.1 11/19/2017   HGB 10.5 (L) 11/19/2017   HCT 30.8 (L) 11/19/2017   MCV 97.3 11/19/2017   PLT 195 11/19/2017   Recent Labs    10/08/17 0900 10/29/17 0910 11/19/17 1354  NA 137 138 140  K 5.1 4.8 4.3  CL 106 105 106  CO2 _0 GLUCOSE 220* 142* 126*  BUN 29* 24* 32*  CREATININE 1.60* 1.49* 1.64*  CALCIUM 9.2 9.0 9.2  GFRNONAA 41* 45* 40*  GFRAA 48* 52* 46*  PROT 6.9 6.7 7.0  ALBUMIN 4.0 3.8 4.2  AST 31 33 29  ALT _1 ALKPHOS 93 97 92  BILITOT 0.4 0.5 0.5    RADIOGRAPHIC STUDIES: I have personally reviewed the radiological images as listed and agreed with the findings in the report. 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 EXAM: CT CHEST WITHOUT CONTRAST TECHNIQUE: Multidetector CT imaging of the chest was performed following the standard protocol without IV contrast. COMPARISON:  09/15/2017 chest CT.  07/09/2017 PET-CT. FINDINGS: Cardiovascular: Normal heart size. No significant pericardial fluid/thickening. Three-vessel coronary atherosclerosis. Atherosclerotic nonaneurysmal thoracic aorta. Normal caliber pulmonary arteries. Mediastinum/Nodes: Stable subcentimeter hypodense upper right thyroid lobe nodule. Unremarkable esophagus. No axillary adenopathy. Stable mild right paratracheal adenopathy measuring up to 1.1 cm (series 2/ image 37). No new pathologically enlarged mediastinal or discrete hilar nodes on this noncontrast scan. Lungs/Pleura: No pneumothorax. No pleural effusion. Posterior basilar solid right pleural mass measures 7.5 x 5.1 cm (series 2/ image 115), previously 7.8 x 5.1 cm on 09/15/2017 using similar measurement technique, not appreciably changed. The right subdiaphragmatic portion of this mass  appears increased. The intrahepatic portion of this mass in the posterior right liver lobe measures approximately 5.3 x 5.0 cm (series 2/ image 129), previously 4.6 x 4.2 cm using similar measurement technique, increased. The extrahepatic subdiaphragmatic portion of this mass measures 4.7 x 3.0 cm (series 2/ image 138), previously 3.6 x 2.4 cm using similar measurement technique, increased. Scattered curvilinear posterior right pleural hyperdensity and pleural thickening is compatible with history of talc pleurodesis. No acute consolidative airspace disease or new significant pulmonary nodules. Previously described right lower lobe 4 mm pulmonary nodule is absent on today's scan. Upper abdomen: See  above. Musculoskeletal: No aggressive appearing focal osseous lesions. Mild thoracic spondylosis. IMPRESSION: 1. Pleural portion of the infiltrative trans-diaphragmatic posterior lower right chest mass is stable. 2. Subdiaphragmatic portion of the mass is increased in size with increased apparent invasion of the posterior right liver lobe. 3. Stable mild right paratracheal adenopathy, which was non hypermetabolic on 29/52/8413 PET-CT. 4. No new sites of metastatic disease in the chest. Previously described solitary tiny right lower lobe pulmonary nodule is absent on this scan. 5. Three-vessel coronary atherosclerosis. Aortic Atherosclerosis (ICD10-I70.0). Electronically Signed   By: Ilona Sorrel M.D.   On: 10/23/2017 16:14    ASSESSMENT & PLAN:   [name], malignant ([place]) # [place] mesothelioma- [approximately 10 cm mass- extending above and below the diaphragm on the right side]. S/p carbo-Alimta s/p cycle #4- NOV 29th  CT chest Progression [stable supra diaphragmatic mass; however increasing size of the subdiaphragmatic mass; no new lesions].  # Given the progressive disease noted in the CT scan after 4 cycles of carbo Alimta-I would recommend discontinuation of further chemotherapy.  Evaluated at [facility] for  clinical trial; NOT candidate sec to CKD.  # will plan to start Pembrolizumab every 3 weeks; approved by NCCN guidelines/as per recommendations from [place].  Discussed with Dr.Stinchcomb.     I discussed the mechanism of action; The goal of therapy is palliative; and length of treatments are likely ongoing/based upon the results of the scans. Discussed the potential side effects of immunotherapy including but not limited to diarrhea; skin rash; elevated LFTs/endocrine abnormalities etc.  # Bloating/ NO acsites-recommend Tums.  If worse would recommend CT of the abdomen pelvis.  #TSH/baseline 4.57-slightly abnormal; monitor again.  # proceed with [place] in 1 week; follow up with me in 4 weeks/labs/MD.   All questions were answered. The patient knows to call the clinic with any problems, questions or concerns.    Cammie Sickle, MD 11/19/2017 4:49 PM

## 2017-11-19 NOTE — Progress Notes (Signed)
DISCONTINUE ON PATHWAY REGIMEN - [name]  No Medical Intervention - Off Treatment.  REASON: Other Reason PRIOR TREATMENT: Off Treatment  START ON PATHWAY REGIMEN - [name]     A cycle is every 21 days:     Gemcitabine   **Always confirm dose/schedule in your pharmacy ordering system**    Patient Characteristics: Second [place] and [place] AJCC M Category: M0 AJCC 8 Stage Grouping: IIIB Current evidence of distant metastases<= No AJCC T Category: T4 AJCC N Category: N0 Line of therapy: Second [place] and Beyond Would you be surprised if this patient died  in the next year<= I would be surprised if this patient died in the next year Intent of Therapy: Non-Curative / Palliative Intent, Discussed with Patient

## 2017-11-19 NOTE — Telephone Encounter (Signed)
Patient called asking if Dr B has heard from Dr Durenda Hurt yet regarding starting clinical trial at [facility]. Please return his call [phone]

## 2017-11-19 NOTE — Telephone Encounter (Signed)
Per Dr B see him today with CBC, CMP, TSH @ 215PM Patient agrees to come in at 145 for lab then see md

## 2017-11-19 NOTE — Progress Notes (Signed)
DISCONTINUE ON PATHWAY REGIMEN - [name]     A cycle is every 21 days:     Pemetrexed      Carboplatin   **Always confirm dose/schedule in your pharmacy ordering system**    REASON: Disease Progression PRIOR TREATMENT: [id number]: Carboplatin AUC=5 + Pemetrexed 500 mg/m2 q21 Days x 4 Cycles TREATMENT RESPONSE: Progressive Disease (PD)  [name] - No Medical Intervention - Off Treatment.  Patient Characteristics: Second Line and [employer] M Category: M0 AJCC 8 Stage Grouping: IIIB Current evidence of distant metastases<= No AJCC T Category: T4 AJCC N Category: N0 Line of therapy: [place] Would you be surprised if this patient died  in the next year<= I would be surprised if this patient died in the next year

## 2017-11-22 ENCOUNTER — Other Ambulatory Visit: Payer: Self-pay | Admitting: Internal Medicine

## 2017-11-28 ENCOUNTER — Inpatient Hospital Stay: Payer: Medicare Other | Attending: Internal Medicine

## 2017-11-28 ENCOUNTER — Other Ambulatory Visit: Payer: Self-pay | Admitting: Internal Medicine

## 2017-11-28 VITALS — BP 145/72 | HR 66 | Temp 98.9°F | Resp 18 | Wt 202.8 lb

## 2017-11-28 DIAGNOSIS — I129 Hypertensive chronic kidney disease with stage 1 through stage 4 chronic kidney disease, or unspecified chronic kidney disease: Secondary | ICD-10-CM | POA: Insufficient documentation

## 2017-11-28 DIAGNOSIS — Z7982 Long term (current) use of aspirin: Secondary | ICD-10-CM | POA: Insufficient documentation

## 2017-11-28 DIAGNOSIS — E1122 Type 2 diabetes mellitus with diabetic chronic kidney disease: Secondary | ICD-10-CM | POA: Diagnosis not present

## 2017-11-28 DIAGNOSIS — R140 Abdominal distension (gaseous): Secondary | ICD-10-CM | POA: Diagnosis not present

## 2017-11-28 DIAGNOSIS — E785 Hyperlipidemia, unspecified: Secondary | ICD-10-CM | POA: Diagnosis not present

## 2017-11-28 DIAGNOSIS — N183 Chronic kidney disease, stage 3 (moderate): Secondary | ICD-10-CM | POA: Diagnosis not present

## 2017-11-28 DIAGNOSIS — I679 Cerebrovascular disease, unspecified: Secondary | ICD-10-CM | POA: Insufficient documentation

## 2017-11-28 DIAGNOSIS — J90 Pleural effusion, not elsewhere classified: Secondary | ICD-10-CM | POA: Diagnosis not present

## 2017-11-28 DIAGNOSIS — K219 Gastro-esophageal reflux disease without esophagitis: Secondary | ICD-10-CM | POA: Diagnosis not present

## 2017-11-28 DIAGNOSIS — Z79899 Other long term (current) drug therapy: Secondary | ICD-10-CM | POA: Diagnosis not present

## 2017-11-28 DIAGNOSIS — R978 Other abnormal tumor markers: Secondary | ICD-10-CM | POA: Diagnosis not present

## 2017-11-28 DIAGNOSIS — C459 Mesothelioma, unspecified: Secondary | ICD-10-CM | POA: Insufficient documentation

## 2017-11-28 DIAGNOSIS — Z87891 Personal history of nicotine dependence: Secondary | ICD-10-CM | POA: Insufficient documentation

## 2017-11-28 DIAGNOSIS — Z8673 Personal history of transient ischemic attack (TIA), and cerebral infarction without residual deficits: Secondary | ICD-10-CM | POA: Insufficient documentation

## 2017-11-28 DIAGNOSIS — Z5111 Encounter for antineoplastic chemotherapy: Secondary | ICD-10-CM | POA: Insufficient documentation

## 2017-11-28 IMAGING — CT CT BIOPSY
1 of 2 series · 14 of 32 positions shown, 19 images · non-contrast
Comparison: none

INDICATION: Right posterior pleural/retroperitoneal mass

[Series 2: i-spiral 5.0 b30f · axial · 0.84mm/px · z∈[-392,-238]mm · 14 of 50 slices shown, 19 images]
[im 3/50  soft-tissue]
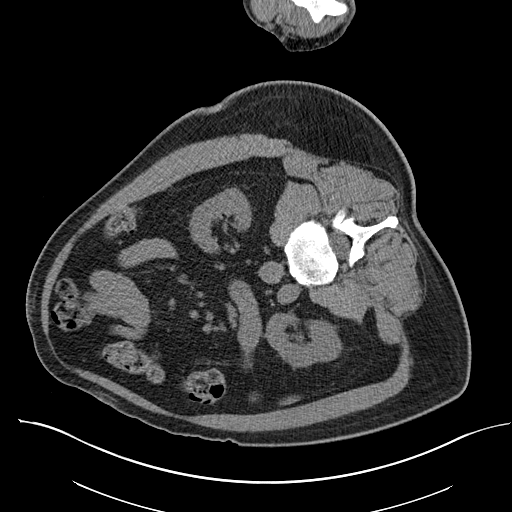
[im 3/50  bone]
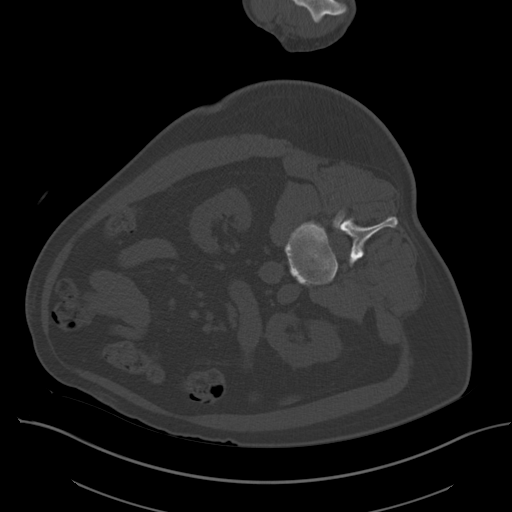
[im 8/50  soft-tissue]
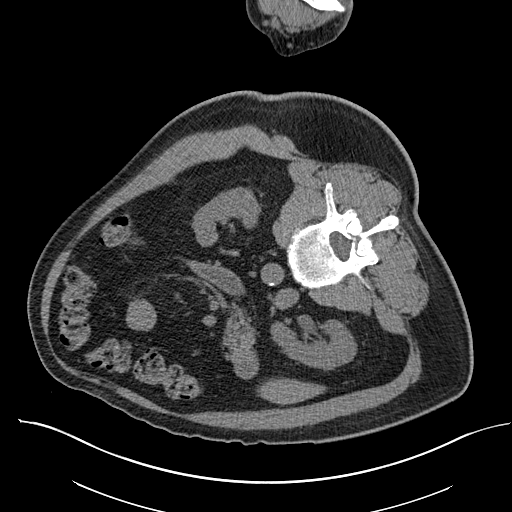
[im 10/50  soft-tissue]
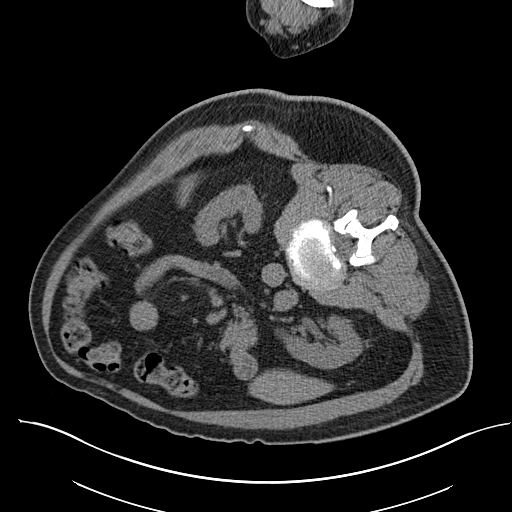
[im 15/50  soft-tissue]
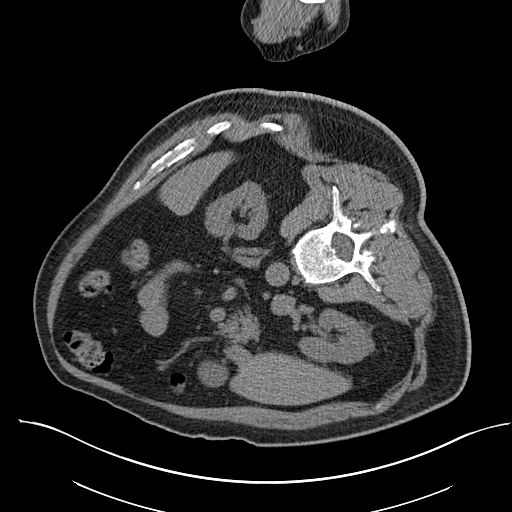
[im 18/50  soft-tissue]
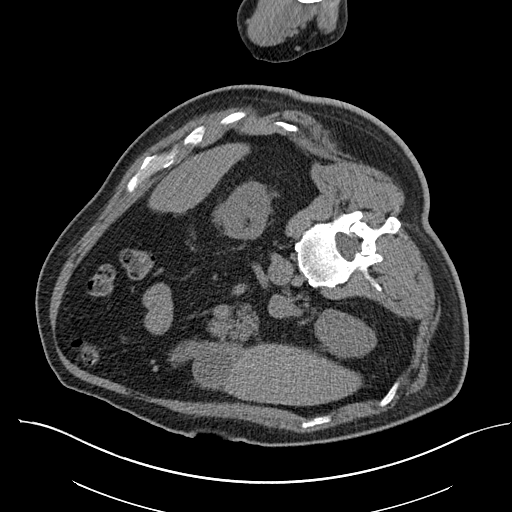
[im 23/50  soft-tissue]
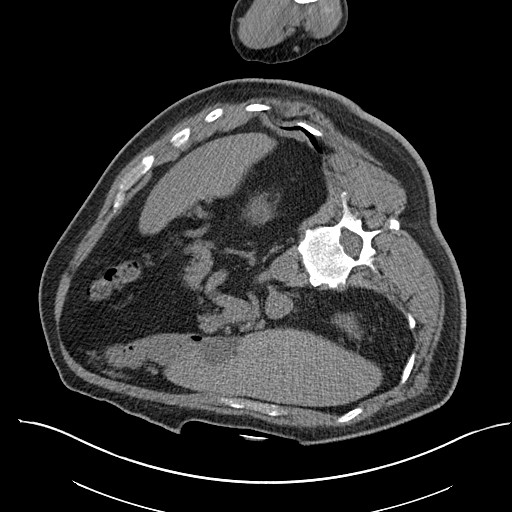
[im 25/50  soft-tissue]
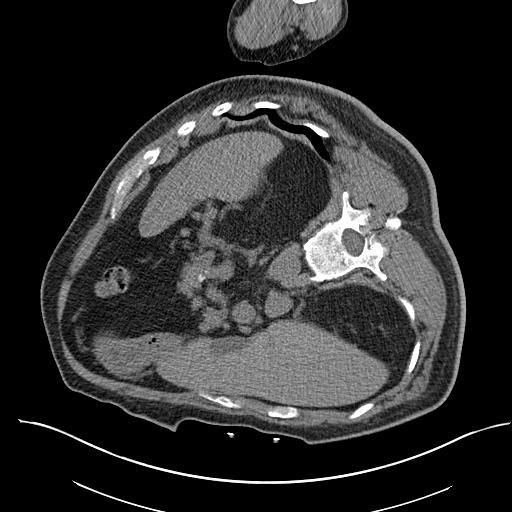
[im 27/50  soft-tissue]
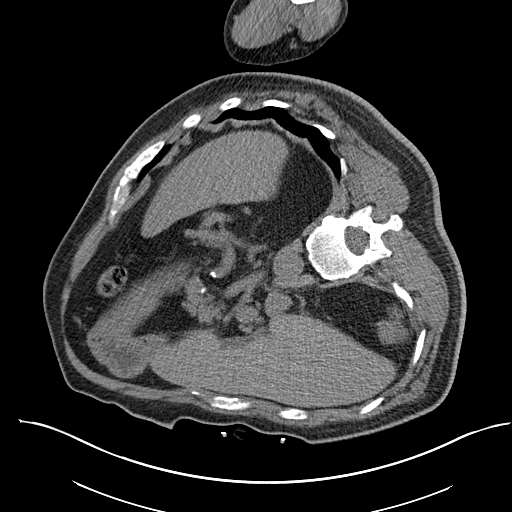
[im 32/50  soft-tissue]
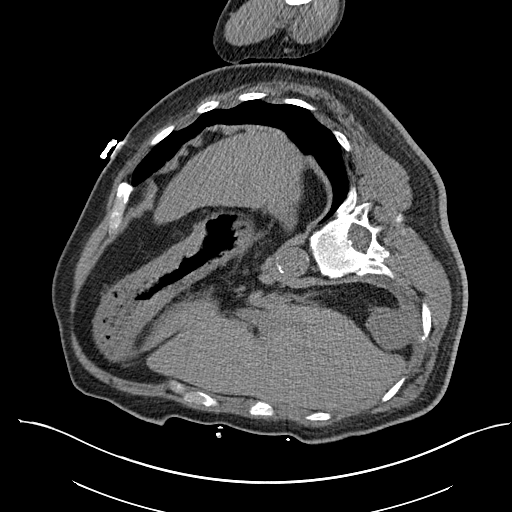
[im 32/50  bone]
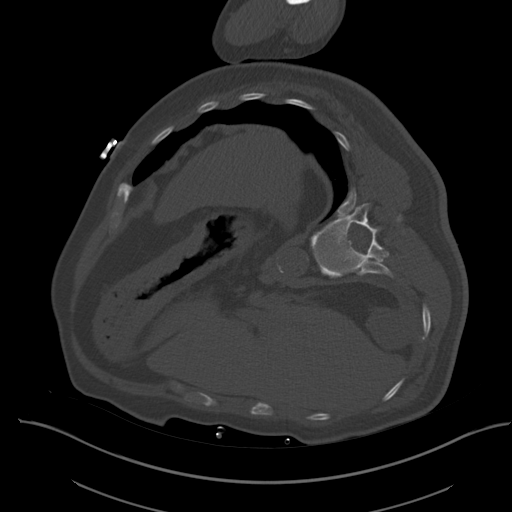
[im 35/50  soft-tissue]
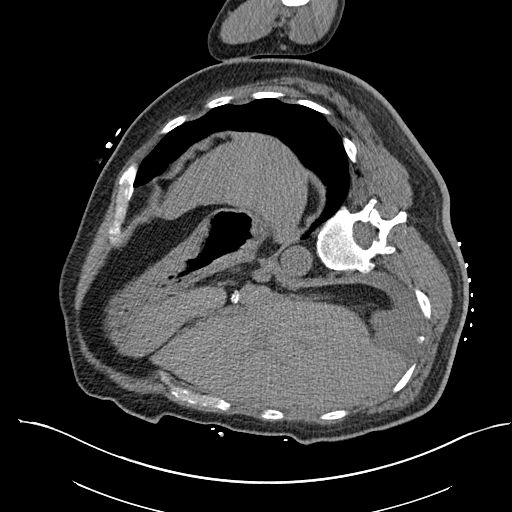
[im 40/50  soft-tissue]
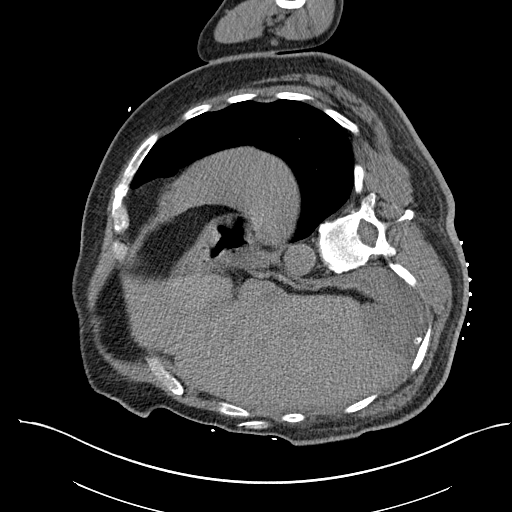
[im 40/50  lung]
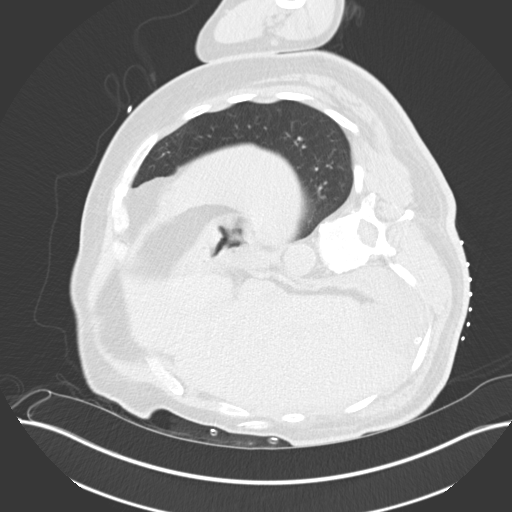
[im 42/50  soft-tissue]
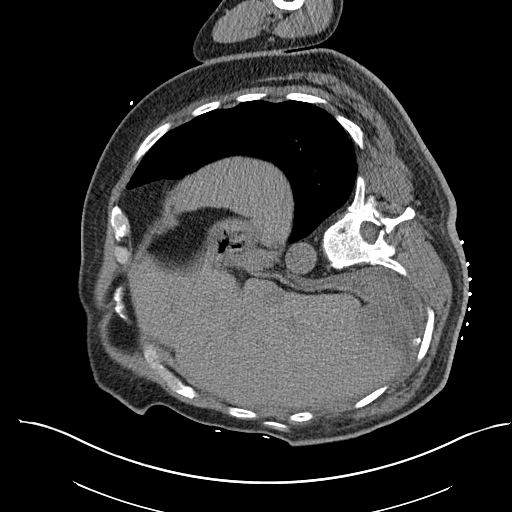
[im 42/50  lung]
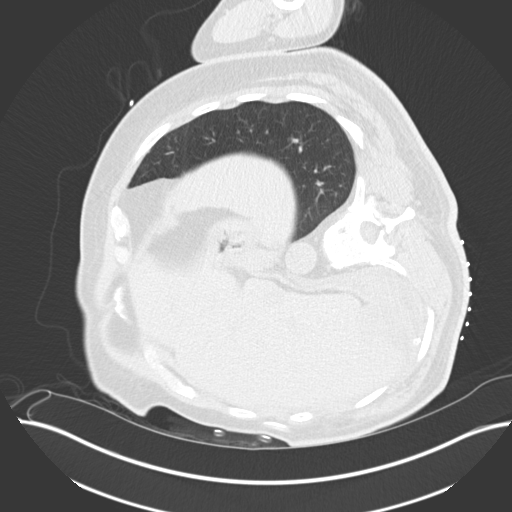
[im 45/50  lung]
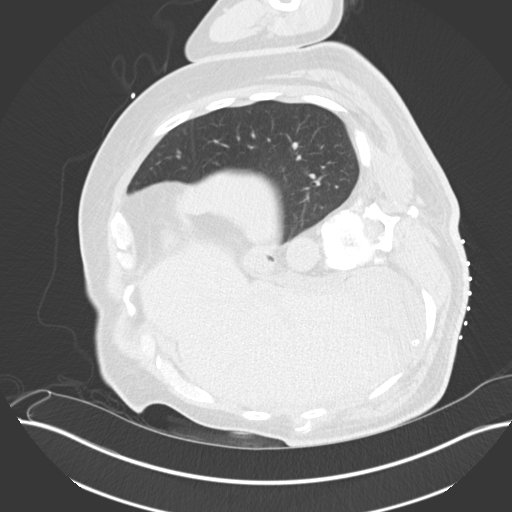
[im 47/50  soft-tissue]
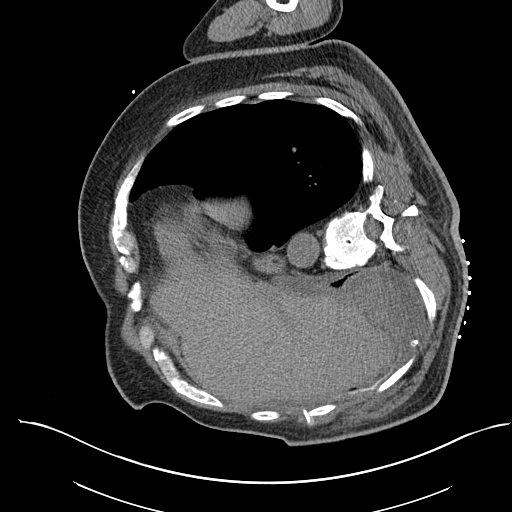
[im 47/50  lung]
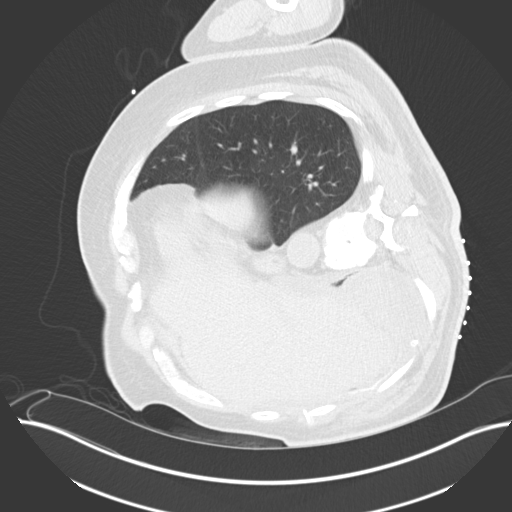

[14 of 32 positions shown; findings below may reference images not displayed]

EXAM:
CT-GUIDED BIOPSY RIGHT POSTERIOR PLEURAL/RETROPERITONEAL MASS

MEDICATIONS:
1% lidocaine local

ANESTHESIA/SEDATION:
2.0 mg IV Versed; 50 mcg IV Fentanyl

Moderate Sedation Time:  21 minutes

The patient was continuously monitored during the procedure by the
interventional radiology nurse under my direct supervision.

PROCEDURE:
The procedure, risks, benefits, and alternatives were explained to
the patient. Questions regarding the procedure were encouraged and
answered. The patient understands and consents to the procedure.

previous imaging reviewed. patient positioned right side down
decubitus. noncontrast localization ct performed. the posterior
right pleural/retroperitoneal mass was localized. overlying skin
marked posteriorly in the right flank area.

under sterile conditions and local anesthesia, a 17 gauge 6.8 cm
access needle was advanced from a RIGHT posterior lower intercostal
approach to the lesion. needle position confirmed within the lesion.
several 18 gauge core biopsies performed at 2 separate locations
within the mass. samples were placed in formalin. needle tract
embolized with gel-foam. needle removed. postprocedure imaging
demonstrates a small amount of hemorrhage adjacent to the mass but
no large hematoma.

Patient tolerated the procedure well without complication. Vital
sign monitoring by nursing staff during the procedure will continue
as patient is in the special procedures unit for post procedure
observation.
FINDINGS: The images document guide needle placement within the right
posterior pleural/retroperitoneal mass. Post biopsy images
demonstrate trace amount of surrounding hemorrhage.

COMPLICATIONS:
None immediate.
IMPRESSION: Successful CT-guided core biopsy of the right posterior
pleural/retroperitoneal mass

## 2017-11-28 MED ORDER — SODIUM CHLORIDE 0.9 % IV SOLN
Freq: Once | INTRAVENOUS | Status: AC
  Administered 2017-11-28: 14:00:00 via INTRAVENOUS
  Filled 2017-11-28: qty 1000

## 2017-11-28 MED ORDER — SODIUM CHLORIDE 0.9 % IV SOLN
200.0000 mg | Freq: Once | INTRAVENOUS | Status: AC
  Administered 2017-11-28: 200 mg via INTRAVENOUS
  Filled 2017-11-28: qty 4

## 2017-11-28 NOTE — Progress Notes (Signed)
Most recent lab results from 11-19-17. Spoke with MD, Dr. Rogue Bussing, via telephone. Per MD order: no new lab work needed today. Reference lab results from 11-19-17 and proceed with Keytruda treatment at this time.

## 2017-12-12 ENCOUNTER — Encounter: Payer: Self-pay | Admitting: Pharmacy Technician

## 2017-12-12 NOTE — Progress Notes (Unsigned)
Patient has been approved for drug assistance by [employer] for [employer]. The enrollment period is from 12/12/17-11/24/18 based on off label use. First DOS covered is 12/19/17.

## 2017-12-19 ENCOUNTER — Inpatient Hospital Stay: Payer: Medicare Other

## 2017-12-19 ENCOUNTER — Encounter: Payer: Self-pay | Admitting: Internal Medicine

## 2017-12-19 ENCOUNTER — Inpatient Hospital Stay (HOSPITAL_BASED_OUTPATIENT_CLINIC_OR_DEPARTMENT_OTHER): Payer: Medicare Other | Admitting: Internal Medicine

## 2017-12-19 VITALS — BP 135/80 | HR 75 | Temp 98.1°F | Resp 16 | Wt 200.4 lb

## 2017-12-19 DIAGNOSIS — K219 Gastro-esophageal reflux disease without esophagitis: Secondary | ICD-10-CM | POA: Diagnosis not present

## 2017-12-19 DIAGNOSIS — R140 Abdominal distension (gaseous): Secondary | ICD-10-CM | POA: Diagnosis not present

## 2017-12-19 DIAGNOSIS — R978 Other abnormal tumor markers: Secondary | ICD-10-CM

## 2017-12-19 DIAGNOSIS — Z79899 Other long term (current) drug therapy: Secondary | ICD-10-CM

## 2017-12-19 DIAGNOSIS — C459 Mesothelioma, unspecified: Secondary | ICD-10-CM

## 2017-12-19 DIAGNOSIS — Z5111 Encounter for antineoplastic chemotherapy: Secondary | ICD-10-CM | POA: Diagnosis not present

## 2017-12-19 LAB — CBC WITH DIFFERENTIAL/PLATELET
Basophils Absolute: 0 10*3/uL (ref 0–0.1)
Basophils Relative: 1 %
Eosinophils Absolute: 0.1 10*3/uL (ref 0–0.7)
Eosinophils Relative: 2 %
HEMATOCRIT: 32.6 % — AB (ref 40.0–52.0)
HEMOGLOBIN: 11.1 g/dL — AB (ref 13.0–18.0)
LYMPHS ABS: 0.8 10*3/uL — AB (ref 1.0–3.6)
LYMPHS PCT: 21 %
MCH: 32.5 pg (ref 26.0–34.0)
MCHC: 34 g/dL (ref 32.0–36.0)
MCV: 95.6 fL (ref 80.0–100.0)
MONOS PCT: 10 %
Monocytes Absolute: 0.4 10*3/uL (ref 0.2–1.0)
NEUTROS PCT: 66 %
Neutro Abs: 2.4 10*3/uL (ref 1.4–6.5)
Platelets: 190 10*3/uL (ref 150–440)
RBC: 3.41 MIL/uL — ABNORMAL LOW (ref 4.40–5.90)
RDW: 11.6 % (ref 11.5–14.5)
WBC: 3.6 10*3/uL — ABNORMAL LOW (ref 3.8–10.6)

## 2017-12-19 LAB — COMPREHENSIVE METABOLIC PANEL
ALK PHOS: 100 U/L (ref 38–126)
ALT: 23 U/L (ref 17–63)
ANION GAP: 7 (ref 5–15)
AST: 26 U/L (ref 15–41)
Albumin: 4.1 g/dL (ref 3.5–5.0)
BILIRUBIN TOTAL: 0.4 mg/dL (ref 0.3–1.2)
BUN: 40 mg/dL — ABNORMAL HIGH (ref 6–20)
CALCIUM: 9.2 mg/dL (ref 8.9–10.3)
CO2: 26 mmol/L (ref 22–32)
CREATININE: 1.54 mg/dL — AB (ref 0.61–1.24)
Chloride: 107 mmol/L (ref 101–111)
GFR calc Af Amer: 50 mL/min — ABNORMAL LOW (ref >60)
GFR calc non Af Amer: 43 mL/min — ABNORMAL LOW (ref >60)
Glucose, Bld: 108 mg/dL — ABNORMAL HIGH (ref 65–99)
Potassium: 5 mmol/L (ref 3.5–5.1)
Sodium: 140 mmol/L (ref 135–145)
TOTAL PROTEIN: 6.8 g/dL (ref 6.5–8.1)

## 2017-12-19 MED ORDER — SODIUM CHLORIDE 0.9 % IV SOLN
200.0000 mg | Freq: Once | INTRAVENOUS | Status: AC
  Administered 2017-12-19: 200 mg via INTRAVENOUS
  Filled 2017-12-19: qty 8

## 2017-12-19 MED ORDER — SODIUM CHLORIDE 0.9 % IV SOLN
Freq: Once | INTRAVENOUS | Status: AC
  Administered 2017-12-19: 12:00:00 via INTRAVENOUS
  Filled 2017-12-19: qty 1000

## 2017-12-19 NOTE — Progress Notes (Signed)
Per Dr Rogue Bussing, ok to proceed with treatment though Creatinine is outside of parameters.

## 2017-12-19 NOTE — Progress Notes (Signed)
Right not to proceed [place] NOTE  Patient Care Team: Derinda Late, MD as PCP - General (Family Medicine) Margaretha Sheffield, MD (Otolaryngology) Telford Nab, RN as Registered Nurse Nestor Lewandowsky, MD as Referring Physician (Cardiothoracic Surgery) Cammie Sickle, MD as Consulting Physician (Internal Medicine)  CHIEF COMPLAINTS/PURPOSE OF CONSULTATION: Malignant pleural effusion.   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 PN  # MOLECULAR TESTING- PDL-1- 1% [LOW]; [facility] One- No actionable**     [name], malignant (Ian Shaffer)    HISTORY OF PRESENTING ILLNESS:  Ian Shaffer 72 y.o.  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  Patient is currently status post 1 cycle so for  Patient noted to have mild abdominal discomfort/bloating "passing gas".  Denies any significant abdominal distention. He denies any worsening shortness of breath or chest pain. No fever no chills. Denies any headaches.  Denies any tingling or numbness.  Denies any skin rash.  ROS: A complete 10 point review of system is done which is negative except mentioned above in history of present illness  MEDICAL HISTORY:  Past Medical History:  Diagnosis Date  . Benign essential hypertension 05/31/2014  . Cancer ([place])  06/2017   right lung   . Cerebral infarction ([place]) 05/31/2014  . Cerebrovascular disease 05/31/2014  . CKD (chronic kidney disease) stage 3, GFR 30-59 ml/min (HCC) 02/23/2016  . CVA (cerebral infarction)   . Diabetes mellitus without complication ([place])   . Difficult intubation   . DVT (deep venous thrombosis) ([place])   . Esophageal reflux 05/31/2014  . H/O: CVA (cerebrovascular accident)   . Hyperlipidemia   . Hypertension   . ICAO (internal carotid artery occlusion) March 01, 2014   Left  . 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 Pleural effusion 04/23/2017  . Stroke [facility]) April, 4,2015  . Type 2 diabetes mellitus with peripheral neuropathy ([place]) 02/23/2016  . Weakness     SURGICAL HISTORY: Past Surgical History:  Procedure Laterality Date  . CHEST TUBE INSERTION N/A 06/25/2017   Procedure: OEUMPN CATHETER INSERTION;  Surgeon: Nestor Lewandowsky, MD;  Location: [facility] ORS;  Service: Thoracic;  Laterality: N/A;  . CHEST TUBE INSERTION Right 08/07/2017   Procedure: REMOVAL OF PLEUR X CATHETER;  Surgeon: Nestor Lewandowsky, MD;  Location: [facility] ORS;  Service: General;  Laterality: Right;  . NASAL SINUS SURGERY  2000  . TONSILLECTOMY    . VIDEO ASSISTED THORACOSCOPY Right 06/25/2017   Procedure: VIDEO ASSISTED THORACOSCOPY WITH TALC PLEURODESIS, CHEST TUBE INSERTION;  Surgeon: Nestor Lewandowsky, MD;  Location: [facility] ORS;  Service: Thoracic;  Laterality: Right;    SOCIAL HISTORY: no smoke/ no alcohol ; [place] 9 [place] ago; retd [employer]; but  dad had furnace- ? Few years in child hood;Exposure to agent orange in [place]. Has no children.  Social History   Socioeconomic History  .  Marital status: Married    Spouse name: Not on file  . Number of children: Not on file  . Years of education: Not on file  . Highest education level: Not on file  Social Needs  . Financial resource strain: Not on file  . Food insecurity - worry: Not on file  .  Food insecurity - inability: Not on file  . Transportation needs - medical: Not on file  . Transportation needs - non-medical: Not on file  Occupational History  . Not on file  Tobacco Use  . Smoking status: Former Smoker    Types: Pipe    Last attempt to quit: 05/07/1974    Years since quitting: 43.6  . Smokeless tobacco: Never Used  . Tobacco comment: pt states he smoked a pipe a long time ago  Substance and Sexual Activity  . Alcohol use: No  . Drug use: No  . Sexual activity: Not on file  Other Topics Concern  . Not on file  Social History Narrative  . Not on file    FAMILY HISTORY: Family History  Problem Relation Age of Onset  . Hypertension Mother   . Hypertension Father     ALLERGIES:  is allergic to sulfa antibiotics; adhesive [tape]; amoxicillin; and other.  MEDICATIONS:  Current Outpatient Medications  Medication Sig Dispense Refill  . amLODipine (NORVASC) 5 MG tablet Take 5 mg by mouth daily.    Marland Kitchen aspirin EC 81 MG tablet Take 81 mg by mouth daily.    . clopidogrel (PLAVIX) 75 MG tablet Take 75 mg by mouth daily.    Marland Kitchen dexamethasone (DECADRON) 4 MG tablet Take one pill AM & PM x 3 days; start the day prior to chemo. 60 tablet 0  . fluticasone (FLONASE) 50 MCG/ACT nasal spray Place 1 spray into both nostrils daily at 2 PM.     . folic acid (FOLVITE) 1 MG tablet Take 1 tablet (1 mg total) by mouth daily. (Patient taking differently: Take 1 mg by mouth daily at 2 PM. ) 90 tablet 1  . Homeopathic Products (NERVE PAIN RELIEF SL) Take 1 tablet by mouth daily. NERVE RENEW (METHYLCOBALAMIN-BENFOTIAMINE-STABILIZED R-ALPHA LIPOIC ACID)    . liraglutide (VICTOZA) 18 MG/3ML SOPN Inject 1.2 mg into the skin daily.    Marland Kitchen lisinopril (PRINIVIL,ZESTRIL) 10 MG tablet Take 10 mg by mouth daily.    . magic mouthwash w/lidocaine SOLN Take 10 mLs by mouth 3 (three) times daily. 300 mL 0  . metoprolol succinate (TOPROL-XL) 25 MG 24 hr tablet Take 25 mg by mouth daily.    . Multiple  Vitamins-Minerals (CENTRUM SILVER PO) Take 1 tablet by mouth daily.    . ondansetron (ZOFRAN) 8 MG tablet Take 8 mg by mouth every 8 (eight) hours as needed for nausea or refractory nausea / vomiting.     . pantoprazole (PROTONIX) 40 MG tablet Take 40 mg by mouth daily before breakfast.    . polyethylene glycol powder (GLYCOLAX/MIRALAX) powder Take 17 g by mouth daily as needed (for constipation.).    Marland Kitchen pravastatin (PRAVACHOL) 40 MG tablet Take 40 mg by mouth daily.    . prochlorperazine (COMPAZINE) 10 MG tablet Take 10 mg by mouth every 8 (eight) hours as needed for vomiting.      No current facility-administered medications for this visit.       Marland Kitchen  PHYSICAL EXAMINATION: ECOG PERFORMANCE STATUS: 0 - Asymptomatic  Vitals:   12/19/17 1056  BP: 135/80  Pulse: 75  Resp: 16  Temp: 98.1 F (36.7 C)   Filed Weights   12/19/17 1056  Weight: 200 lb 6.4 oz (90.9 kg)    GENERAL: Well-nourished well-developed; Alert, no distress and comfortable.  He is alone. EYES: no pallor or icterus OROPHARYNX: no thrush or ulceration; good dentition  NECK: supple, no masses felt LYMPH:  no palpable lymphadenopathy in the cervical, axillary or inguinal regions LUNGS: Decreased breath sounds on the right side compared to the left. No wheeze or crackles HEART/CVS: regular rate & rhythm and no murmurs; No lower extremity edema ABDOMEN: abdomen soft, non-tender and normal bowel sounds Musculoskeletal:no cyanosis of digits and no clubbing  PSYCH: alert & oriented x 3 with fluent speech NEURO: no focal motor/sensory deficits SKIN:  no rashes or significant lesions  LABORATORY DATA:  I have reviewed the data as listed Lab Results  Component Value Date   WBC 3.6 (L) 12/19/2017   HGB 11.1 (L) 12/19/2017   HCT 32.6 (L) 12/19/2017   MCV 95.6 12/19/2017   PLT 190 12/19/2017   Recent Labs    10/29/17 0910 11/19/17 1354 12/19/17 1024  NA 138 140 140  K 4.8 4.3 5.0  CL 105 106 107  CO2 25 25 26    GLUCOSE 142* 126* 108*  BUN 24* 32* 40*  CREATININE 1.49* 1.64* 1.54*  CALCIUM 9.0 9.2 9.2  GFRNONAA 45* 40* 43*  GFRAA 52* 46* 50*  PROT 6.7 7.0 6.8  ALBUMIN 3.8 4.2 4.1  AST 33 29 26  ALT 28 22 23   ALKPHOS 97 92 100  BILITOT 0.5 0.5 0.4    RADIOGRAPHIC STUDIES: I have personally reviewed the radiological images as listed and agreed with the findings in the report. No results found.  ASSESSMENT & PLAN:   [name], malignant ([place]) # Likley mesothelioma- [approximately 10 cm mass- extending above and below the diaphragm on the right side]. NOV 29th  CT chest Progression [stable supra diaphragmatic mass; however increasing size of the subdiaphragmatic mass; no new lesions].  Currently on Keytruda status post cycle #1.  #Proceed with cycle #2 of Keytruda today.  Labs today reviewed;  acceptable for treatment today.  We will plan to get a CT scan after 3 cycles.  #Continued bloating/reflux recommend continued PPI/Tums.  #TSH/baseline 4.57-slightly abnormal; monitor again.  # follow up in 3 weeks/keytruda- will order CT scan at next visit.   All questions were answered. The patient knows to call the clinic with any problems, questions or concerns.    Cammie Sickle, MD 12/23/2017 8:06 AM

## 2017-12-19 NOTE — Assessment & Plan Note (Addendum)
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 NOV 29th  CT chest Progression [stable supra diaphragmatic mass; however increasing size of the subdiaphragmatic mass; no new lesions].  Currently on Keytruda status post cycle #1.  #Proceed with cycle #2 of Keytruda today.  Labs today reviewed;  acceptable for treatment today.  We will plan to get a CT scan after 3 cycles.  #Continued bloating/reflux recommend continued PPI/Tums.  #TSH/baseline 4.57-slightly abnormal; monitor again.  # follow up in 3 weeks/keytruda- will order CT scan at next visit.

## 2017-12-31 ENCOUNTER — Other Ambulatory Visit: Payer: Medicare Other

## 2017-12-31 ENCOUNTER — Ambulatory Visit: Payer: Medicare Other | Admitting: Internal Medicine

## 2018-01-01 ENCOUNTER — Other Ambulatory Visit: Payer: Self-pay | Admitting: Internal Medicine

## 2018-01-09 ENCOUNTER — Inpatient Hospital Stay: Payer: Medicare Other | Attending: Internal Medicine

## 2018-01-09 ENCOUNTER — Inpatient Hospital Stay (HOSPITAL_BASED_OUTPATIENT_CLINIC_OR_DEPARTMENT_OTHER): Payer: Medicare Other | Admitting: Internal Medicine

## 2018-01-09 ENCOUNTER — Encounter: Payer: Self-pay | Admitting: *Deleted

## 2018-01-09 ENCOUNTER — Inpatient Hospital Stay: Payer: Medicare Other

## 2018-01-09 VITALS — BP 135/71 | HR 64 | Resp 18

## 2018-01-09 VITALS — BP 144/85 | HR 65 | Temp 97.9°F | Resp 16 | Wt 203.0 lb

## 2018-01-09 DIAGNOSIS — Z8673 Personal history of transient ischemic attack (TIA), and cerebral infarction without residual deficits: Secondary | ICD-10-CM | POA: Insufficient documentation

## 2018-01-09 DIAGNOSIS — Z7982 Long term (current) use of aspirin: Secondary | ICD-10-CM | POA: Insufficient documentation

## 2018-01-09 DIAGNOSIS — C459 Mesothelioma, unspecified: Secondary | ICD-10-CM

## 2018-01-09 DIAGNOSIS — Z79899 Other long term (current) drug therapy: Secondary | ICD-10-CM | POA: Insufficient documentation

## 2018-01-09 DIAGNOSIS — E785 Hyperlipidemia, unspecified: Secondary | ICD-10-CM | POA: Insufficient documentation

## 2018-01-09 DIAGNOSIS — N183 Chronic kidney disease, stage 3 (moderate): Secondary | ICD-10-CM

## 2018-01-09 DIAGNOSIS — K219 Gastro-esophageal reflux disease without esophagitis: Secondary | ICD-10-CM | POA: Insufficient documentation

## 2018-01-09 DIAGNOSIS — I679 Cerebrovascular disease, unspecified: Secondary | ICD-10-CM | POA: Diagnosis not present

## 2018-01-09 DIAGNOSIS — E1122 Type 2 diabetes mellitus with diabetic chronic kidney disease: Secondary | ICD-10-CM | POA: Diagnosis not present

## 2018-01-09 DIAGNOSIS — I129 Hypertensive chronic kidney disease with stage 1 through stage 4 chronic kidney disease, or unspecified chronic kidney disease: Secondary | ICD-10-CM | POA: Diagnosis not present

## 2018-01-09 DIAGNOSIS — Z87891 Personal history of nicotine dependence: Secondary | ICD-10-CM | POA: Insufficient documentation

## 2018-01-09 DIAGNOSIS — J90 Pleural effusion, not elsewhere classified: Secondary | ICD-10-CM | POA: Insufficient documentation

## 2018-01-09 LAB — COMPREHENSIVE METABOLIC PANEL
ALK PHOS: 90 U/L (ref 38–126)
ALT: 20 U/L (ref 17–63)
AST: 23 U/L (ref 15–41)
Albumin: 3.9 g/dL (ref 3.5–5.0)
Anion gap: 7 (ref 5–15)
BUN: 34 mg/dL — AB (ref 6–20)
CALCIUM: 9.4 mg/dL (ref 8.9–10.3)
CHLORIDE: 104 mmol/L (ref 101–111)
CO2: 28 mmol/L (ref 22–32)
CREATININE: 1.64 mg/dL — AB (ref 0.61–1.24)
GFR calc non Af Amer: 40 mL/min — ABNORMAL LOW (ref >60)
GFR, EST AFRICAN AMERICAN: 46 mL/min — AB (ref >60)
Glucose, Bld: 116 mg/dL — ABNORMAL HIGH (ref 65–99)
Potassium: 4.9 mmol/L (ref 3.5–5.1)
SODIUM: 139 mmol/L (ref 135–145)
Total Bilirubin: 0.4 mg/dL (ref 0.3–1.2)
Total Protein: 6.8 g/dL (ref 6.5–8.1)

## 2018-01-09 LAB — TSH: TSH: 6.062 u[IU]/mL — ABNORMAL HIGH (ref 0.350–4.500)

## 2018-01-09 LAB — CBC WITH DIFFERENTIAL/PLATELET
BASOS PCT: 1 %
Basophils Absolute: 0.1 10*3/uL (ref 0–0.1)
EOS ABS: 0.1 10*3/uL (ref 0–0.7)
EOS PCT: 3 %
HCT: 33.5 % — ABNORMAL LOW (ref 40.0–52.0)
HEMOGLOBIN: 11.6 g/dL — AB (ref 13.0–18.0)
LYMPHS ABS: 0.9 10*3/uL — AB (ref 1.0–3.6)
Lymphocytes Relative: 22 %
MCH: 32.4 pg (ref 26.0–34.0)
MCHC: 34.7 g/dL (ref 32.0–36.0)
MCV: 93.3 fL (ref 80.0–100.0)
MONO ABS: 0.4 10*3/uL (ref 0.2–1.0)
MONOS PCT: 11 %
Neutro Abs: 2.6 10*3/uL (ref 1.4–6.5)
Neutrophils Relative %: 63 %
Platelets: 177 10*3/uL (ref 150–440)
RBC: 3.59 MIL/uL — ABNORMAL LOW (ref 4.40–5.90)
RDW: 11.6 % (ref 11.5–14.5)
WBC: 4.1 10*3/uL (ref 3.8–10.6)

## 2018-01-09 MED ORDER — LANSOPRAZOLE 30 MG PO CPDR
30.0000 mg | DELAYED_RELEASE_CAPSULE | Freq: Every day | ORAL | 4 refills | Status: DC
Start: 2018-01-09 — End: 2018-02-25

## 2018-01-09 MED ORDER — SODIUM CHLORIDE 0.9 % IV SOLN
200.0000 mg | Freq: Once | INTRAVENOUS | Status: AC
  Administered 2018-01-09: 200 mg via INTRAVENOUS
  Filled 2018-01-09: qty 8

## 2018-01-09 MED ORDER — SODIUM CHLORIDE 0.9 % IV SOLN
Freq: Once | INTRAVENOUS | Status: AC
  Administered 2018-01-09: 11:00:00 via INTRAVENOUS
  Filled 2018-01-09: qty 1000

## 2018-01-09 NOTE — Progress Notes (Signed)
Right not to proceed [place] NOTE  Patient Care Team: Derinda Late, MD as PCP - General (Family Medicine) Margaretha Sheffield, MD (Otolaryngology) Telford Nab, RN as Registered Nurse Nestor Lewandowsky, MD as Referring Physician (Cardiothoracic Surgery) Cammie Sickle, MD as Consulting Physician (Internal Medicine)  CHIEF COMPLAINTS/PURPOSE OF CONSULTATION: Malignant pleural effusion.   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 PN  # MOLECULAR TESTING- PDL-1- 1% [LOW]; [facility]- No actionable**     [name], malignant ([place])    HISTORY OF PRESENTING ILLNESS:  Ian Shaffer 72 y.o.  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  Patient is currently status post 2 cycle so far.  Patient continues to have intermittent abdominal discomfort/bloating.  Question abdominal distention.  He denies any worsening shortness of breath or cough.  No fever or chills.  No headaches.  No back pain.  ROS: A complete 10 point review of system is done which is negative except mentioned above in history of present illness  MEDICAL HISTORY:  Past Medical History:  Diagnosis Date  . Benign essential hypertension 05/31/2014  . Cancer ([place]) 06/2017   right lung   . Cerebral infarction ([place]) 05/31/2014  .  Cerebrovascular disease 05/31/2014  . CKD (chronic kidney disease) stage 3, GFR 30-59 ml/min (HCC) 02/23/2016  . CVA (cerebral infarction)   . Diabetes mellitus without complication ([place])   . Difficult intubation   . DVT (deep venous thrombosis) ([place])   . Esophageal reflux 05/31/2014  . H/O: CVA (cerebrovascular accident)   . Hyperlipidemia   . Hypertension   . ICAO (internal carotid artery occlusion) March 01, 2014   Left  . 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 Pleural effusion 04/23/2017  . Stroke [facility]) April, 4,2015  . Type 2 diabetes mellitus with peripheral neuropathy ([place]) 02/23/2016  . Weakness     SURGICAL HISTORY: Past Surgical History:  Procedure Laterality Date  . CHEST TUBE INSERTION N/A 06/25/2017   Procedure: QHUTML CATHETER INSERTION;  Surgeon: Nestor Lewandowsky, MD;  Location: [facility] ORS;  Service: Thoracic;  Laterality: N/A;  . CHEST TUBE INSERTION Right 08/07/2017   Procedure: REMOVAL OF PLEUR X CATHETER;  Surgeon: Nestor Lewandowsky, MD;  Location: [facility] ORS;  Service: General;  Laterality: Right;  . NASAL SINUS SURGERY  2000  . TONSILLECTOMY    . VIDEO ASSISTED THORACOSCOPY Right 06/25/2017   Procedure: VIDEO ASSISTED THORACOSCOPY WITH TALC PLEURODESIS, CHEST TUBE INSERTION;  Surgeon: Nestor Lewandowsky, MD;  Location: [facility] ORS;  Service: Thoracic;  Laterality: Right;    SOCIAL HISTORY: no smoke/ no alcohol ; [place] 9 [place]; retd [employer]; but  dad had furnace- ? Few years in child hood;Exposure to agent orange in [place]. Has no children.  Social History   Socioeconomic History  . Marital status: Married    Spouse name: Not on  file  . Number of children: Not on file  . Years of education: Not on file  . Highest education level: Not on file  Social Needs  . Financial resource strain: Not on file  . Food insecurity - worry: Not on file  . Food insecurity - inability: Not on file  . Transportation needs  - medical: Not on file  . Transportation needs - non-medical: Not on file  Occupational History  . Not on file  Tobacco Use  . Smoking status: Former Smoker    Types: Pipe    Last attempt to quit: 05/07/1974    Years since quitting: 43.7  . Smokeless tobacco: Never Used  . Tobacco comment: pt states he smoked a pipe a long time ago  Substance and Sexual Activity  . Alcohol use: No  . Drug use: No  . Sexual activity: Not on file  Other Topics Concern  . Not on file  Social History Narrative  . Not on file    FAMILY HISTORY: Family History  Problem Relation Age of Onset  . Hypertension Mother   . Hypertension Father     ALLERGIES:  is allergic to sulfa antibiotics; adhesive [tape]; amoxicillin; and other.  MEDICATIONS:  Current Outpatient Medications  Medication Sig Dispense Refill  . amLODipine (NORVASC) 5 MG tablet Take 5 mg by mouth daily.    Marland Kitchen aspirin EC 81 MG tablet Take 81 mg by mouth daily.    . clopidogrel (PLAVIX) 75 MG tablet Take 75 mg by mouth daily.    . fluticasone (FLONASE) 50 MCG/ACT nasal spray Place 1 spray into both nostrils daily at 2 PM.     . Homeopathic Products (NERVE PAIN RELIEF SL) Take 1 tablet by mouth daily. NERVE RENEW (METHYLCOBALAMIN-BENFOTIAMINE-STABILIZED R-ALPHA LIPOIC ACID)    . liraglutide (VICTOZA) 18 MG/3ML SOPN Inject 1.2 mg into the skin daily.    Marland Kitchen lisinopril (PRINIVIL,ZESTRIL) 10 MG tablet Take 10 mg by mouth daily.    . magic mouthwash w/lidocaine SOLN Take 10 mLs by mouth 3 (three) times daily. 300 mL 0  . metoprolol succinate (TOPROL-XL) 25 MG 24 hr tablet Take 25 mg by mouth daily.    . Multiple Vitamins-Minerals (CENTRUM SILVER PO) Take 1 tablet by mouth daily.    . ondansetron (ZOFRAN) 8 MG tablet Take 8 mg by mouth every 8 (eight) hours as needed for nausea or refractory nausea / vomiting.     . pantoprazole (PROTONIX) 40 MG tablet Take 40 mg by mouth daily before breakfast.    . polyethylene glycol powder (GLYCOLAX/MIRALAX)  powder Take 17 g by mouth daily as needed (for constipation.).    Marland Kitchen pravastatin (PRAVACHOL) 40 MG tablet Take 40 mg by mouth daily.    . prochlorperazine (COMPAZINE) 10 MG tablet Take 10 mg by mouth every 8 (eight) hours as needed for vomiting.     . lansoprazole (PREVACID) 30 MG capsule Take 1 capsule (30 mg total) by mouth daily before breakfast. 30 capsule 4   No current facility-administered medications for this visit.       Marland Kitchen  PHYSICAL EXAMINATION: ECOG PERFORMANCE STATUS: 0 - Asymptomatic  Vitals:   01/09/18 0949  BP: (!) 144/85  Pulse: 65  Resp: 16  Temp: 97.9 F (36.6 C)   Filed Weights   01/09/18 0949  Weight: 203 lb (92.1 kg)    GENERAL: Well-nourished well-developed; Alert, no distress and comfortable.  He is alone. EYES: no pallor or icterus OROPHARYNX: no thrush or ulceration; good  dentition  NECK: supple, no masses felt LYMPH:  no palpable lymphadenopathy in the cervical, axillary or inguinal regions LUNGS: Decreased breath sounds on the right side compared to the left. No wheeze or crackles HEART/CVS: regular rate & rhythm and no murmurs; No lower extremity edema ABDOMEN: abdomen soft, non-tender and normal bowel sounds Musculoskeletal:no cyanosis of digits and no clubbing  PSYCH: alert & oriented x 3 with fluent speech NEURO: no focal motor/sensory deficits SKIN:  no rashes or significant lesions  LABORATORY DATA:  I have reviewed the data as listed Lab Results  Component Value Date   WBC 4.1 01/09/2018   HGB 11.6 (L) 01/09/2018   HCT 33.5 (L) 01/09/2018   MCV 93.3 01/09/2018   PLT 177 01/09/2018   Recent Labs    11/19/17 1354 12/19/17 1024 01/09/18 0918  NA 140 140 139  K 4.3 5.0 4.9  CL 106 107 104  CO2 25 26 28   GLUCOSE 126* 108* 116*  BUN 32* 40* 34*  CREATININE 1.64* 1.54* 1.64*  CALCIUM 9.2 9.2 9.4  GFRNONAA 40* 43* 40*  GFRAA 46* 50* 46*  PROT 7.0 6.8 6.8  ALBUMIN 4.2 4.1 3.9  AST 29 26 23   ALT 22 23 20   ALKPHOS 92 100 90   BILITOT 0.5 0.4 0.4    RADIOGRAPHIC STUDIES: I have personally reviewed the radiological images as listed and agreed with the findings in the report. No results found.  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 NOV 29th  CT chest Progression [stable supra diaphragmatic mass; however increasing size of the subdiaphragmatic mass; no new lesions].  Currently on Keytruda status post cycle #2.   #Proceed with cycle #3 of Keytruda today.  Labs today reviewed;  acceptable for treatment today.  Will order CT scan today.  #Continued bloating/reflux recommend continued PPI/Tums- tried prilosec/ protonix/ nexium- not completely resolved; Prevacid one day.   #TSH/baseline 4.57-slightly abnormal; monitor again.  # CKD- stage III creat 1.6/stable.   # follow up in 3 weeks/keytruda- prior non-contrast chest CT scan; TSH today.   All questions were answered. The patient knows to call the clinic with any problems, questions or concerns.    Cammie Sickle, MD 01/10/2018 4:27 AM

## 2018-01-09 NOTE — Assessment & Plan Note (Addendum)
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 NOV 29th  CT chest Progression [stable supra diaphragmatic mass; however increasing size of the subdiaphragmatic mass; no new lesions].  Currently on Keytruda status post cycle #2.   #Proceed with cycle #3 of Keytruda today.  Labs today reviewed;  acceptable for treatment today.  Will order CT scan today.  #Continued bloating/reflux recommend continued PPI/Tums- tried prilosec/ protonix/ nexium- not completely resolved; Prevacid one day.   #TSH/baseline 4.57-slightly abnormal; monitor again.  # CKD- stage III creat 1.6/stable.   # follow up in 3 weeks/keytruda- prior non-contrast chest CT scan; TSH today.   Addendum: TSH is slightly up at 6; recommend rechecking thyroid profile at next visit.  Will inform patient.

## 2018-01-11 ENCOUNTER — Telehealth: Payer: Self-pay | Admitting: Internal Medicine

## 2018-01-11 NOTE — Addendum Note (Signed)
Addended by: Cammie Sickle on: 01/11/2018 04:08 PM   Modules accepted: Orders

## 2018-01-11 NOTE — Telephone Encounter (Signed)
TSH is slightly up at 6; recommend rechecking thyroid profile at next visit [I have ordered]- and if still abnormal - will recommend synthroid/thyroid medication.  Please inform patient.

## 2018-01-12 NOTE — Telephone Encounter (Signed)
Patient notified of these recommendations and verbalized understanding.

## 2018-01-28 ENCOUNTER — Ambulatory Visit
Admission: RE | Admit: 2018-01-28 | Discharge: 2018-01-28 | Disposition: A | Discharge status: Home / Self Care | Payer: Medicare Other | Source: Ambulatory Visit | Attending: Internal Medicine | Admitting: Internal Medicine

## 2018-01-28 DIAGNOSIS — C459 Mesothelioma, unspecified: Secondary | ICD-10-CM | POA: Insufficient documentation

## 2018-01-28 DIAGNOSIS — R918 Other nonspecific abnormal finding of lung field: Secondary | ICD-10-CM | POA: Diagnosis not present

## 2018-01-28 DIAGNOSIS — I700 Atherosclerosis of aorta: Secondary | ICD-10-CM | POA: Diagnosis not present

## 2018-01-28 DIAGNOSIS — I2510 Atherosclerotic heart disease of native coronary artery without angina pectoris: Secondary | ICD-10-CM | POA: Diagnosis not present

## 2018-01-28 DIAGNOSIS — R911 Solitary pulmonary nodule: Secondary | ICD-10-CM | POA: Diagnosis not present

## 2018-01-30 ENCOUNTER — Encounter: Payer: Self-pay | Admitting: Internal Medicine

## 2018-01-30 ENCOUNTER — Inpatient Hospital Stay: Payer: Medicare Other | Attending: Internal Medicine

## 2018-01-30 ENCOUNTER — Inpatient Hospital Stay: Payer: Medicare Other

## 2018-01-30 ENCOUNTER — Inpatient Hospital Stay (HOSPITAL_BASED_OUTPATIENT_CLINIC_OR_DEPARTMENT_OTHER): Payer: Medicare Other | Admitting: Internal Medicine

## 2018-01-30 VITALS — BP 104/74 | HR 93 | Temp 97.6°F | Resp 18 | Ht 70.0 in | Wt 205.8 lb

## 2018-01-30 DIAGNOSIS — C459 Mesothelioma, unspecified: Secondary | ICD-10-CM

## 2018-01-30 DIAGNOSIS — Z79899 Other long term (current) drug therapy: Secondary | ICD-10-CM | POA: Diagnosis not present

## 2018-01-30 DIAGNOSIS — I700 Atherosclerosis of aorta: Secondary | ICD-10-CM | POA: Insufficient documentation

## 2018-01-30 DIAGNOSIS — Z7982 Long term (current) use of aspirin: Secondary | ICD-10-CM | POA: Diagnosis not present

## 2018-01-30 DIAGNOSIS — N183 Chronic kidney disease, stage 3 (moderate): Secondary | ICD-10-CM | POA: Insufficient documentation

## 2018-01-30 DIAGNOSIS — Z8673 Personal history of transient ischemic attack (TIA), and cerebral infarction without residual deficits: Secondary | ICD-10-CM | POA: Insufficient documentation

## 2018-01-30 DIAGNOSIS — J90 Pleural effusion, not elsewhere classified: Secondary | ICD-10-CM | POA: Diagnosis not present

## 2018-01-30 DIAGNOSIS — Z5111 Encounter for antineoplastic chemotherapy: Secondary | ICD-10-CM | POA: Diagnosis present

## 2018-01-30 DIAGNOSIS — E1122 Type 2 diabetes mellitus with diabetic chronic kidney disease: Secondary | ICD-10-CM | POA: Diagnosis not present

## 2018-01-30 DIAGNOSIS — E785 Hyperlipidemia, unspecified: Secondary | ICD-10-CM | POA: Diagnosis not present

## 2018-01-30 DIAGNOSIS — I679 Cerebrovascular disease, unspecified: Secondary | ICD-10-CM | POA: Insufficient documentation

## 2018-01-30 DIAGNOSIS — K219 Gastro-esophageal reflux disease without esophagitis: Secondary | ICD-10-CM | POA: Diagnosis not present

## 2018-01-30 DIAGNOSIS — I129 Hypertensive chronic kidney disease with stage 1 through stage 4 chronic kidney disease, or unspecified chronic kidney disease: Secondary | ICD-10-CM | POA: Diagnosis not present

## 2018-01-30 DIAGNOSIS — I2510 Atherosclerotic heart disease of native coronary artery without angina pectoris: Secondary | ICD-10-CM | POA: Insufficient documentation

## 2018-01-30 DIAGNOSIS — R197 Diarrhea, unspecified: Secondary | ICD-10-CM | POA: Diagnosis not present

## 2018-01-30 DIAGNOSIS — Z87891 Personal history of nicotine dependence: Secondary | ICD-10-CM | POA: Insufficient documentation

## 2018-01-30 LAB — CBC WITH DIFFERENTIAL/PLATELET
BASOS ABS: 0 10*3/uL (ref 0–0.1)
Basophils Relative: 1 %
EOS PCT: 3 %
Eosinophils Absolute: 0.1 10*3/uL (ref 0–0.7)
HEMATOCRIT: 34.7 % — AB (ref 40.0–52.0)
HEMOGLOBIN: 12.2 g/dL — AB (ref 13.0–18.0)
LYMPHS ABS: 1.1 10*3/uL (ref 1.0–3.6)
LYMPHS PCT: 25 %
MCH: 32 pg (ref 26.0–34.0)
MCHC: 35.2 g/dL (ref 32.0–36.0)
MCV: 90.7 fL (ref 80.0–100.0)
Monocytes Absolute: 0.4 10*3/uL (ref 0.2–1.0)
Monocytes Relative: 8 %
NEUTROS ABS: 2.8 10*3/uL (ref 1.4–6.5)
NEUTROS PCT: 63 %
PLATELETS: 167 10*3/uL (ref 150–440)
RBC: 3.83 MIL/uL — AB (ref 4.40–5.90)
RDW: 11.9 % (ref 11.5–14.5)
WBC: 4.4 10*3/uL (ref 3.8–10.6)

## 2018-01-30 LAB — COMPREHENSIVE METABOLIC PANEL
ALT: 19 U/L (ref 17–63)
ANION GAP: 5 (ref 5–15)
AST: 26 U/L (ref 15–41)
Albumin: 4 g/dL (ref 3.5–5.0)
Alkaline Phosphatase: 77 U/L (ref 38–126)
BILIRUBIN TOTAL: 0.4 mg/dL (ref 0.3–1.2)
BUN: 33 mg/dL — AB (ref 6–20)
CHLORIDE: 105 mmol/L (ref 101–111)
CO2: 29 mmol/L (ref 22–32)
Calcium: 9.7 mg/dL (ref 8.9–10.3)
Creatinine, Ser: 1.55 mg/dL — ABNORMAL HIGH (ref 0.61–1.24)
GFR calc Af Amer: 50 mL/min — ABNORMAL LOW (ref >60)
GFR, EST NON AFRICAN AMERICAN: 43 mL/min — AB (ref >60)
Glucose, Bld: 128 mg/dL — ABNORMAL HIGH (ref 65–99)
POTASSIUM: 5.2 mmol/L — AB (ref 3.5–5.1)
Sodium: 139 mmol/L (ref 135–145)
TOTAL PROTEIN: 6.8 g/dL (ref 6.5–8.1)

## 2018-01-30 MED ORDER — SODIUM CHLORIDE 0.9 % IV SOLN
Freq: Once | INTRAVENOUS | Status: AC
  Administered 2018-01-30: 11:00:00 via INTRAVENOUS
  Filled 2018-01-30: qty 1000

## 2018-01-30 MED ORDER — PEMBROLIZUMAB CHEMO INJECTION 100 MG/4ML
200.0000 mg | Freq: Once | INTRAVENOUS | Status: AC
  Administered 2018-01-30: 200 mg via INTRAVENOUS
  Filled 2018-01-30: qty 8

## 2018-01-30 NOTE — Progress Notes (Signed)
No new changes noted today 

## 2018-01-30 NOTE — Assessment & Plan Note (Addendum)
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  #Patient currently on Keytruda status post 3 cycles-March 2019 CT chest Progression [stable supra diaphragmatic mass; however increasing size of the subdiaphragmatic mass; subcentimeter lung nodule increasing left lower lobe].   #I had a long discussion the patient regarding the progressive findings noted on the CT scan-however patient continues to be asymptomatic; given the clinical benefit I would recommend continued treatment with [facility] for 2-3 more cycles before repeating a scan.   #Also discussed with Dr. Durenda Hurt; [facility]; no clinical trials available.  If continued progression option would be gemcitabine.   #Proceed with cycle #4 of Keytruda today.  Labs today reviewed;  acceptable for treatment today.    #Continued bloating/reflux recommend continued PPI/Tums- tried prilosec/ protonix/ nexium- not completely resolved; Prevacid one day; dsicussed re: EGD' hold off- weight stable.   #TSH/baseline -TSH 6 with normal free T4 free T3-monitor for now  # CKD- stage III creat 1.6/stable;K- 5.2 monitor for now.   # follow up in 3 weeks/keytruda/MD.   # I reviewed the blood work- with the patient in detail; also reviewed the imaging independently [as summarized above]; and with the patient in detail.

## 2018-01-30 NOTE — Progress Notes (Signed)
Right not to proceed [place] NOTE  Patient Care Team: Derinda Late, MD as PCP - General (Family Medicine) Margaretha Sheffield, MD (Otolaryngology) Telford Nab, RN as Registered Nurse Nestor Lewandowsky, MD as Referring Physician (Cardiothoracic Surgery) Cammie Sickle, MD as Consulting Physician (Internal Medicine)  CHIEF COMPLAINTS/PURPOSE OF CONSULTATION: Malignant pleural effusion.   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 PN  # MOLECULAR TESTING- PDL-1- 1% [LOW]; [facility]- No actionable**     [name], malignant ([place])    HISTORY OF PRESENTING ILLNESS:  Ian Shaffer 72 y.o.  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  Patient is currently status post 3 cycle so far; reviewed the results of the restaging CAT scan.  Patient denies any worsening shortness of breath or cough.  Denies any fevers or chills.  No headaches.  No back pain.  His abdominal discomfort bloating is improved not complete resolved.  ROS: A complete 10 point review of system is done which is negative except mentioned above in history of present illness  MEDICAL HISTORY:  Past Medical History:  Diagnosis Date  . Benign essential hypertension 05/31/2014  . Cancer ([place]) 06/2017   right lung   . Cerebral  infarction ([place]) 05/31/2014  . Cerebrovascular disease 05/31/2014  . CKD (chronic kidney disease) stage 3, GFR 30-59 ml/min ([facility]) 02/23/2016  . CVA (cerebral infarction)   . Diabetes mellitus without complication ([place])   . Difficult intubation   . DVT (deep venous thrombosis) ([place])   . Esophageal reflux 05/31/2014  . H/O: CVA (cerebrovascular accident)   . Hyperlipidemia   . Hypertension   . ICAO (internal carotid artery occlusion) March 01, 2014   Left  . 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 Pleural effusion 04/23/2017  . Stroke [facility]) April, 4,2015  . Type 2 diabetes mellitus with peripheral neuropathy ([place]) 02/23/2016  . Weakness     SURGICAL HISTORY: Past Surgical History:  Procedure Laterality Date  . CHEST TUBE INSERTION N/A 06/25/2017   Procedure: WPYKDX CATHETER INSERTION;  Surgeon: Nestor Lewandowsky, MD;  Location: [facility] ORS;  Service: Thoracic;  Laterality: N/A;  . CHEST TUBE INSERTION Right 08/07/2017   Procedure: REMOVAL OF PLEUR X CATHETER;  Surgeon: Nestor Lewandowsky, MD;  Location: [facility] ORS;  Service: General;  Laterality: Right;  . NASAL SINUS SURGERY  2000  . TONSILLECTOMY    . VIDEO ASSISTED THORACOSCOPY Right 06/25/2017   Procedure: VIDEO ASSISTED THORACOSCOPY WITH TALC PLEURODESIS, CHEST TUBE INSERTION;  Surgeon: Nestor Lewandowsky, MD;  Location: [facility] ORS;  Service: Thoracic;  Laterality: Right;    SOCIAL HISTORY: no smoke/ no alcohol ; [place]; retd [employer]; but  dad had furnace- ? Few years in child hood;Exposure to agent orange in [place]. Has no children.  Social History   Socioeconomic History  . Marital status: Married  Spouse name: Not on file  . Number of children: Not on file  . Years of education: Not on file  . Highest education level: Not on file  Social Needs  . Financial resource strain: Not on file  . Food insecurity - worry: Not on file  . Food insecurity - inability: Not on  file  . Transportation needs - medical: Not on file  . Transportation needs - non-medical: Not on file  Occupational History  . Not on file  Tobacco Use  . Smoking status: Former Smoker    Types: Pipe    Last attempt to quit: 05/07/1974    Years since quitting: 43.7  . Smokeless tobacco: Never Used  . Tobacco comment: pt states he smoked a pipe a long time ago  Substance and Sexual Activity  . Alcohol use: No  . Drug use: No  . Sexual activity: Not on file  Other Topics Concern  . Not on file  Social History Narrative  . Not on file    FAMILY HISTORY: Family History  Problem Relation Age of Onset  . Hypertension Mother   . Hypertension Father     ALLERGIES:  is allergic to sulfa antibiotics; adhesive [tape]; amoxicillin; and other.  MEDICATIONS:  Current Outpatient Medications  Medication Sig Dispense Refill  . amLODipine (NORVASC) 5 MG tablet Take 5 mg by mouth daily.    Marland Kitchen aspirin EC 81 MG tablet Take 81 mg by mouth daily.    . clopidogrel (PLAVIX) 75 MG tablet Take 75 mg by mouth daily.    . fluticasone (FLONASE) 50 MCG/ACT nasal spray Place 1 spray into both nostrils daily at 2 PM.     . lansoprazole (PREVACID) 30 MG capsule Take 1 capsule (30 mg total) by mouth daily before breakfast. 30 capsule 4  . liraglutide (VICTOZA) 18 MG/3ML SOPN Inject 1.2 mg into the skin daily.    Marland Kitchen lisinopril (PRINIVIL,ZESTRIL) 10 MG tablet Take 10 mg by mouth daily.    . metoprolol succinate (TOPROL-XL) 25 MG 24 hr tablet Take 25 mg by mouth daily.    . Multiple Vitamins-Minerals (CENTRUM SILVER PO) Take 1 tablet by mouth daily.    . polyethylene glycol powder (GLYCOLAX/MIRALAX) powder Take 17 g by mouth daily as needed (for constipation.).    Marland Kitchen pravastatin (PRAVACHOL) 40 MG tablet Take 40 mg by mouth daily.    . Homeopathic Products (NERVE PAIN RELIEF SL) Take 1 tablet by mouth daily. NERVE RENEW (METHYLCOBALAMIN-BENFOTIAMINE-STABILIZED R-ALPHA LIPOIC ACID)    . magic mouthwash  w/lidocaine SOLN Take 10 mLs by mouth 3 (three) times daily. (Patient not taking: Reported on 01/30/2018) 300 mL 0  . ondansetron (ZOFRAN) 8 MG tablet Take 8 mg by mouth every 8 (eight) hours as needed for nausea or refractory nausea / vomiting.     . pantoprazole (PROTONIX) 40 MG tablet Take 40 mg by mouth daily before breakfast.    . prochlorperazine (COMPAZINE) 10 MG tablet Take 10 mg by mouth every 8 (eight) hours as needed for vomiting.      No current facility-administered medications for this visit.       Marland Kitchen  PHYSICAL EXAMINATION: ECOG PERFORMANCE STATUS: 0 - Asymptomatic  Vitals:   01/30/18 0926  BP: 104/74  Pulse: 93  Resp: 18  Temp: 97.6 F (36.4 C)  SpO2: 98%   Filed Weights   01/30/18 0926  Weight: 205 lb 12.8 oz (93.4 kg)    GENERAL: Well-nourished well-developed; Alert, no distress and comfortable.  He is accompanied by his wife. EYES: no pallor or icterus OROPHARYNX: no thrush or ulceration; good dentition  NECK: supple, no masses felt LYMPH:  no palpable lymphadenopathy in the cervical, axillary or inguinal regions LUNGS: Decreased breath sounds on the right side compared to the left. No wheeze or crackles HEART/CVS: regular rate & rhythm and no murmurs; No lower extremity edema ABDOMEN: abdomen soft, non-tender and normal bowel sounds Musculoskeletal:no cyanosis of digits and no clubbing  PSYCH: alert & oriented x 3 with fluent speech NEURO: no focal motor/sensory deficits SKIN:  no rashes or significant lesions  LABORATORY DATA:  I have reviewed the data as listed Lab Results  Component Value Date   WBC 4.4 01/30/2018   HGB 12.2 (L) 01/30/2018   HCT 34.7 (L) 01/30/2018   MCV 90.7 01/30/2018   PLT 167 01/30/2018   Recent Labs    12/19/17 1024 01/09/18 0918 01/30/18 0900  NA 140 139 139  K 5.0 4.9 5.2*  CL 107 104 105  CO2 26 28 29   GLUCOSE 108* 116* 128*  BUN 40* 34* 33*  CREATININE 1.54* 1.64* 1.55*  CALCIUM 9.2 9.4 9.7  GFRNONAA 43* 40*  43*  GFRAA 50* 46* 50*  PROT 6.8 6.8 6.8  ALBUMIN 4.1 3.9 4.0  AST 26 23 26   ALT 23 20 19   ALKPHOS 100 90 77  BILITOT 0.4 0.4 0.4    RADIOGRAPHIC STUDIES: I have personally reviewed the radiological images as listed and agreed with the findings in the report. Ct Chest Wo Contrast  Result Date: 01/28/2018 CLINICAL DATA:  [name] 7/18.  Immunotherapy.  Asymptomatic. EXAM: CT CHEST WITHOUT CONTRAST TECHNIQUE: Multidetector CT imaging of the chest was performed following the standard protocol without IV contrast. COMPARISON:  10/23/2017 FINDINGS: Cardiovascular: Aortic atherosclerosis. Tortuous thoracic aorta. Normal heart size, without pericardial effusion. Multivessel coronary artery atherosclerosis. Mediastinum/Nodes: Hypoattenuating 12 mm right thyroid nodule is nonspecific and present on the prior. No supraclavicular adenopathy. Right paratracheal node is borderline enlarged, similar at 10 mm on image 39/2. Hilar regions poorly evaluated without intravenous contrast. Lungs/Pleura: Right greater than left pleural thickening is mild. No pleural fluid. Pleural-based partially calcified lesion in the inferior right hemithorax measures 6.3 x 4.0 cm on image 116/2. Compare 7.5 x 5.1 cm at the same level on the prior. Linear right upper lobe scarring. Minimal pleural-based nodularity in the right lower lobe at 3 mm on image 94/3, new. Left lower lobe pulmonary nodule measures 6 mm on image 84/3 and is enlarged from 1 mm on the prior. Upper Abdomen: Direct extension of pleural-based mass into the right lobe of the liver. Hypoattenuating component is somewhat difficult to define secondary to noncontrast technique. On the order of 7.3 x 5.4 cm on image 128/2. Compare 5.3 x 5.0 cm at the same level on the prior. Normal imaged portions of the spleen, stomach, pancreas, gallbladder, biliary tract, adrenal glands, kidneys. Musculoskeletal: No acute osseous abnormality. IMPRESSION: 1. Mixed response to therapy,  with overall disease progression. 2. Decrease in right-sided pleural-based component of infiltrative mass. Progression of subdiaphragmatic component with direct extension into the liver. 3. Enlarging left lower lobe pulmonary nodule, highly suspicious for pulmonary metastasis. 4. Coronary artery atherosclerosis. Aortic Atherosclerosis (ICD10-I70.0). Electronically Signed   By: Abigail Miyamoto M.D.   On: 01/28/2018 10:37    ASSESSMENT & PLAN:   [name], malignant ([place]) # [place] mesothelioma- [approximately 10 cm mass- extending above and below the diaphragm on the right side].  #Patient currently on Keytruda  status post 3 cycles-March 2019 CT chest Progression [stable supra diaphragmatic mass; however increasing size of the subdiaphragmatic mass; subcentimeter lung nodule increasing left lower lobe].   #I had a long discussion the patient regarding the progressive findings noted on the CT scan-however patient continues to be asymptomatic; given the clinical benefit I would recommend continued treatment with [facility] for 2-3 more cycles before repeating a scan.   #Also discussed with Dr. Durenda Hurt; [facility]; no clinical trials available.  If continued progression option would be gemcitabine.   #Proceed with cycle #4 of Keytruda today.  Labs today reviewed;  acceptable for treatment today.    #Continued bloating/reflux recommend continued PPI/Tums- tried prilosec/ protonix/ nexium- not completely resolved; Prevacid one day; dsicussed re: EGD' hold off- weight stable.   #TSH/baseline -TSH 6 with normal free T4 free T3-monitor for now  # CKD- stage III creat 1.6/stable;K- 5.2 monitor for now.   # follow up in 3 weeks/keytruda/MD.   # I reviewed the blood work- with the patient in detail; also reviewed the imaging independently [as summarized above]; and with the patient in detail.    All questions were answered. The patient knows to call the clinic with any problems, questions or concerns.    Cammie Sickle, MD 02/10/2018 7:21 AM

## 2018-01-31 LAB — THYROID PANEL WITH TSH
Free Thyroxine Index: 1.6 (ref 1.2–4.9)
T3 Uptake Ratio: 26 % (ref 24–39)
T4, Total: 6 ug/dL (ref 4.5–12.0)
TSH: 5.5 u[IU]/mL — ABNORMAL HIGH (ref 0.450–4.500)

## 2018-02-16 ENCOUNTER — Telehealth: Payer: Self-pay | Admitting: *Deleted

## 2018-02-16 ENCOUNTER — Encounter: Payer: Self-pay | Admitting: Oncology

## 2018-02-16 ENCOUNTER — Inpatient Hospital Stay (HOSPITAL_BASED_OUTPATIENT_CLINIC_OR_DEPARTMENT_OTHER): Payer: Medicare Other | Admitting: Oncology

## 2018-02-16 VITALS — BP 123/70 | HR 81 | Temp 98.7°F | Resp 14 | Wt 201.0 lb

## 2018-02-16 DIAGNOSIS — C459 Mesothelioma, unspecified: Secondary | ICD-10-CM | POA: Diagnosis not present

## 2018-02-16 DIAGNOSIS — R197 Diarrhea, unspecified: Secondary | ICD-10-CM | POA: Diagnosis not present

## 2018-02-16 DIAGNOSIS — Z5111 Encounter for antineoplastic chemotherapy: Secondary | ICD-10-CM | POA: Diagnosis not present

## 2018-02-16 DIAGNOSIS — Z79899 Other long term (current) drug therapy: Secondary | ICD-10-CM | POA: Diagnosis not present

## 2018-02-16 NOTE — Telephone Encounter (Signed)
Per Dr. Jacinto Reap, patient needs to see Sonia Baller ASAP to rule out GI pathogens and that this could be immunotherapy induced diarrhea.

## 2018-02-16 NOTE — Progress Notes (Signed)
Symptom Management Consult note [facility]  [phone] [phone]  Patient Care Team: Derinda Late, MD as PCP - General (Family Medicine) Margaretha Sheffield, MD (Otolaryngology) Telford Nab, RN as Registered Nurse Nestor Lewandowsky, MD as Referring Physician (Cardiothoracic Surgery) Cammie Sickle, MD as Consulting Physician (Internal Medicine)   Name of the patient: Legion Discher  [id number]  Nov 09, 1946   Date of visit: 02/19/18  Diagnosis- Malignant [name]  Chief complaint/ Reason for visit- Diarrhea  Heme/Onc history: Patient last seen by Dr. Rogue Bussing on 3/8/019, for follow-up prior to cycle 3 Keytruda. At that time patient denied worsening shortness of breath or cough, fever or chills, headaches, back pain, abdominal bloating.  Patient diagnosed in July 2018 with right pleural mass. S/p VATS with Dr. 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 Repeat CT in August 2018 at [facility] revealed mesothelioma. Patient has had several recurrent right-sided pleural effusions with negative cytology. Has had a Pleurx catheter placed in August 2018 and removed in September 2018. 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 Recent CT scan from March 2019 showed progression.    Interval history-  Patient complains of diarrhea.  Symptoms have been present for approximately 3 days.  The symptoms are stable.  Stool frequency is approximately 6 per day.  Patient estimates stool volume to be 1/2 to 1 cup per bowel movement.  Diarrhea does occur at night. The patient has noted no bleeding associated with bowel movements.   Patient reports the following symptoms: fecal urgency and watery diarrhea The patient denies the following symptoms: abdominal cramping relieved by defecation and bloating. The patient  currently denies significant abdominal pain or discomfort.   Relationship to food: the patient denies any relationship to food.  Relationship to medications: the patient denies any relationship to medications.  Other risk factors: immunocompromise. Therapy tried so far: pepto bismol and one dose immodium. Work up so far: none.  ECOG FS:0 - Asymptomatic  Review of systems- Review of Systems  Constitutional: Negative.  Negative for chills, fever, malaise/fatigue and weight loss.  HENT: Negative for congestion and ear pain.   Eyes: Negative.  Negative for blurred vision and double vision.  Respiratory: Negative.  Negative for cough, sputum production and shortness of breath.   Cardiovascular: Negative.  Negative for chest pain, palpitations and leg swelling.  Gastrointestinal: Positive for diarrhea (started after he had food from a buffet a church). Negative for abdominal pain, constipation, nausea and vomiting.  Genitourinary: Negative for dysuria, frequency and urgency.  Musculoskeletal: Negative for back pain and falls.  Skin: Negative.  Negative for rash.  Neurological: Negative.  Negative for weakness and headaches.  Endo/Heme/Allergies: Negative.  Does not bruise/bleed easily.  Psychiatric/Behavioral: Negative.  Negative for depression. The patient is not nervous/anxious and does not have insomnia.      Current treatment- Keyruda  Allergies  Allergen Reactions  . Sulfa Antibiotics Swelling    Facial swelling No tongue or lips swelling, no difficulty breathing.  . Adhesive [Tape] Itching and Rash  . Amoxicillin Nausea Only    Has patient had a PCN reaction causing immediate rash, facial/tongue/throat swelling, SOB or lightheadedness with hypotension: No Has patient had a PCN reaction causing severe rash involving mucus membranes or skin necrosis: No Has patient had a PCN reaction that required hospitalization: No Has patient had a PCN reaction occurring within the last 10  years:  Yes If all of the above answers are "NO", then may proceed with Cephalosporin use.   . Other Rash    Surgical tape     Past Medical History:  Diagnosis Date  . Benign essential hypertension 05/31/2014  . Cancer ([place]) 06/2017   right lung   . Cerebral infarction ([place]) 05/31/2014  . Cerebrovascular disease 05/31/2014  . CKD (chronic kidney disease) stage 3, GFR 30-59 ml/min (HCC) 02/23/2016  . CVA (cerebral infarction)   . Diabetes mellitus without complication ([place])   . Difficult intubation   . DVT (deep venous thrombosis) ([place])   . Esophageal reflux 05/31/2014  . H/O: CVA (cerebrovascular accident)   . Hyperlipidemia   . Hypertension   . ICAO (internal carotid artery occlusion) March 01, 2014   Left  . 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 Pleural effusion 04/23/2017  . Stroke [facility]) April, 4,2015  . Type 2 diabetes mellitus with peripheral neuropathy ([place]) 02/23/2016  . Weakness      Past Surgical History:  Procedure Laterality Date  . CHEST TUBE INSERTION N/A 06/25/2017   Procedure: KDXIPJ CATHETER INSERTION;  Surgeon: Nestor Lewandowsky, MD;  Location: [facility] ORS;  Service: Thoracic;  Laterality: N/A;  . CHEST TUBE INSERTION Right 08/07/2017   Procedure: REMOVAL OF PLEUR X CATHETER;  Surgeon: Nestor Lewandowsky, MD;  Location: [facility] ORS;  Service: General;  Laterality: Right;  . NASAL SINUS SURGERY  2000  . TONSILLECTOMY    . VIDEO ASSISTED THORACOSCOPY Right 06/25/2017   Procedure: VIDEO ASSISTED THORACOSCOPY WITH TALC PLEURODESIS, CHEST TUBE INSERTION;  Surgeon: Nestor Lewandowsky, MD;  Location: [facility] ORS;  Service: Thoracic;  Laterality: Right;    Social History   Socioeconomic History  . Marital status: Married    Spouse name: Not on file  . Number of children: Not on file  . Years of education: Not on file  . Highest education level: Not on file  Occupational History  . Not on file  Social Needs  . Financial resource strain: Not on file  . Food insecurity:     Worry: Not on file    Inability: Not on file  . Transportation needs:    Medical: Not on file    Non-medical: Not on file  Tobacco Use  . Smoking status: Former Smoker    Types: Pipe    Last attempt to quit: 05/07/1974    Years since quitting: 43.8  . Smokeless tobacco: Never Used  . Tobacco comment: pt states he smoked a pipe a long time ago  Substance and Sexual Activity  . Alcohol use: No  . Drug use: No  . Sexual activity: Not on file  Lifestyle  . Physical activity:    Days per week: Not on file    Minutes per session: Not on file  . Stress: Not on file  Relationships  . Social connections:    Talks on phone: Not on file    Gets together: Not on file    Attends religious service: Not on file    Active member of club or organization: Not on file    Attends meetings of clubs or organizations: Not on file    Relationship status: Not on file  . Intimate partner violence:    Fear of current or ex partner: Not on file    Emotionally abused: Not on file    Physically abused: Not on file    Forced sexual activity: Not on file  Other Topics Concern  .  Not on file  Social History Narrative  . Not on file    Family History  Problem Relation Age of Onset  . Hypertension Mother   . Hypertension Father      Current Outpatient Medications:  .  amLODipine (NORVASC) 5 MG tablet, Take 5 mg by mouth daily., Disp: , Rfl:  .  aspirin EC 81 MG tablet, Take 81 mg by mouth daily., Disp: , Rfl:  .  clopidogrel (PLAVIX) 75 MG tablet, Take 75 mg by mouth daily., Disp: , Rfl:  .  fluticasone (FLONASE) 50 MCG/ACT nasal spray, Place 1 spray into both nostrils daily at 2 PM. , Disp: , Rfl:  .  Homeopathic Products (NERVE PAIN RELIEF SL), Take 1 tablet by mouth daily. NERVE RENEW (METHYLCOBALAMIN-BENFOTIAMINE-STABILIZED R-ALPHA LIPOIC ACID), Disp: , Rfl:  .  lansoprazole (PREVACID) 30 MG capsule, Take 1 capsule (30 mg total) by mouth daily before breakfast., Disp: 30 capsule, Rfl: 4 .   liraglutide (VICTOZA) 18 MG/3ML SOPN, Inject 1.2 mg into the skin daily., Disp: , Rfl:  .  lisinopril (PRINIVIL,ZESTRIL) 10 MG tablet, Take 10 mg by mouth daily., Disp: , Rfl:  .  magic mouthwash w/lidocaine SOLN, Take 10 mLs by mouth 3 (three) times daily., Disp: 300 mL, Rfl: 0 .  metoprolol succinate (TOPROL-XL) 25 MG 24 hr tablet, Take 25 mg by mouth daily., Disp: , Rfl:  .  Multiple Vitamins-Minerals (CENTRUM SILVER PO), Take 1 tablet by mouth daily., Disp: , Rfl:  .  ondansetron (ZOFRAN) 8 MG tablet, Take 8 mg by mouth every 8 (eight) hours as needed for nausea or refractory nausea / vomiting. , Disp: , Rfl:  .  polyethylene glycol powder (GLYCOLAX/MIRALAX) powder, Take 17 g by mouth daily as needed (for constipation.)., Disp: , Rfl:  .  pravastatin (PRAVACHOL) 40 MG tablet, Take 40 mg by mouth daily., Disp: , Rfl:  .  prochlorperazine (COMPAZINE) 10 MG tablet, Take 10 mg by mouth every 8 (eight) hours as needed for vomiting. , Disp: , Rfl:   Physical exam:  Vitals:   02/16/18 1538  BP: 123/70  Pulse: 81  Resp: 14  Temp: 98.7 F (37.1 C)  TempSrc: Tympanic  Weight: 201 lb (91.2 kg)   Physical Exam  Constitutional: He is oriented to person, place, and time and well-developed, well-nourished, and in no distress. Vital signs are normal.  HENT:  Head: Normocephalic and atraumatic.  Eyes: Pupils are equal, round, and reactive to light.  Neck: Normal range of motion.  Cardiovascular: Normal rate, regular rhythm and normal heart sounds.  No murmur heard. Pulmonary/Chest: Effort normal and breath sounds normal. He has no wheezes.  Abdominal: Soft. Normal appearance and bowel sounds are normal. He exhibits no distension and no mass. There is no tenderness. There is no rebound.  Musculoskeletal: Normal range of motion. He exhibits no edema.  Neurological: He is alert and oriented to person, place, and time. Gait normal.  Skin: Skin is warm and dry. No rash noted.  Psychiatric: Mood,  memory, affect and judgment normal.     CMP Latest Ref Rng & Units 01/30/2018  Glucose 65 - 99 mg/dL 128(H)  BUN 6 - 20 mg/dL 33(H)  Creatinine 0.61 - 1.24 mg/dL 1.55(H)  Sodium 135 - 145 mmol/L 139  Potassium 3.5 - 5.1 mmol/L 5.2(H)  Chloride 101 - 111 mmol/L 105  CO2 22 - 32 mmol/L 29  Calcium 8.9 - 10.3 mg/dL 9.7  Total Protein 6.5 - 8.1 g/dL 6.8  Total Bilirubin  0.3 - 1.2 mg/dL 0.4  Alkaline Phos 38 - 126 U/L 77  AST 15 - 41 U/L 26  ALT 17 - 63 U/L 19   CBC Latest Ref Rng & Units 01/30/2018  WBC 3.8 - 10.6 K/uL 4.4  Hemoglobin 13.0 - 18.0 g/dL 12.2(L)  Hematocrit 40.0 - 52.0 % 34.7(L)  Platelets 150 - 440 K/uL 167    No images are attached to the encounter.  Ct Chest Wo Contrast  Result Date: 01/28/2018 CLINICAL DATA:  [name] 7/18.  Immunotherapy.  Asymptomatic. EXAM: CT CHEST WITHOUT CONTRAST TECHNIQUE: Multidetector CT imaging of the chest was performed following the standard protocol without IV contrast. COMPARISON:  10/23/2017 FINDINGS: Cardiovascular: Aortic atherosclerosis. Tortuous thoracic aorta. Normal heart size, without pericardial effusion. Multivessel coronary artery atherosclerosis. Mediastinum/Nodes: Hypoattenuating 12 mm right thyroid nodule is nonspecific and present on the prior. No supraclavicular adenopathy. Right paratracheal node is borderline enlarged, similar at 10 mm on image 39/2. Hilar regions poorly evaluated without intravenous contrast. Lungs/Pleura: Right greater than left pleural thickening is mild. No pleural fluid. Pleural-based partially calcified lesion in the inferior right hemithorax measures 6.3 x 4.0 cm on image 116/2. Compare 7.5 x 5.1 cm at the same level on the prior. Linear right upper lobe scarring. Minimal pleural-based nodularity in the right lower lobe at 3 mm on image 94/3, new. Left lower lobe pulmonary nodule measures 6 mm on image 84/3 and is enlarged from 1 mm on the prior. Upper Abdomen: Direct extension of pleural-based mass  into the right lobe of the liver. Hypoattenuating component is somewhat difficult to define secondary to noncontrast technique. On the order of 7.3 x 5.4 cm on image 128/2. Compare 5.3 x 5.0 cm at the same level on the prior. Normal imaged portions of the spleen, stomach, pancreas, gallbladder, biliary tract, adrenal glands, kidneys. Musculoskeletal: No acute osseous abnormality. IMPRESSION: 1. Mixed response to therapy, with overall disease progression. 2. Decrease in right-sided pleural-based component of infiltrative mass. Progression of subdiaphragmatic component with direct extension into the liver. 3. Enlarging left lower lobe pulmonary nodule, highly suspicious for pulmonary metastasis. 4. Coronary artery atherosclerosis. Aortic Atherosclerosis (ICD10-I70.0). Electronically Signed   By: Abigail Miyamoto M.D.   On: 01/28/2018 10:37     Assessment and plan- Patient is a 72 y.o. male  who presents for diarrhea X 3 days.  Denies Nausea/vomiting.   1. Malignant mesothelioma: S/p cycle 4  Keytruda. Last dose was 01/30/2018. He has scheduled follow-up with Dr. Rogue Bussing on 02/20/2018. He has tolerated cycles well.  2. Diarrhea: C Diff and GI Panel to rule out infection. He is afebrile. BP stable. No labs drawn today. He has been drinking plenty of fluids and eating well. C-Diff and GI panel negative for infection. Patient to continue imodium OTC as needed for diarrhea. Patient in agreeance with plan. Patient thinks this is from something that he ate.   Visit Diagnosis 1. Diarrhea, unspecified type     Patient expressed understanding and was in agreement with this plan. He also understands that He can call clinic at any time with any questions, concerns, or complaints.   Greater than 50% was spent in counseling and coordination of care with this patient including but not limited to discussion of the relevant topics above (See A&P) including, but not limited to diagnosis and management of acute and  chronic medical conditions.    Faythe Casa, AGNP-C [facility]- [phone] Pager- [phone] 02/19/2018 11:34 AM

## 2018-02-16 NOTE — Telephone Encounter (Signed)
I would be happy to see him if he feels dehydrated and continues to have loose stools.

## 2018-02-16 NOTE — Telephone Encounter (Signed)
Patient called to report that he has had diarrhea for 2 days and he has had up to 6 loose stools per day. He asked if the [place] Flock was causing it or if he has picked up a "bug" He reports that he is eating activa and that he took Pepto last night.  I asked how long he has been on [place] and he reports he is on his 4th cycle and explained that it is unlikely that the [place] Flock is the cause. I advised that he get some Imodium AD and take it and to drink plenty of fluids such as Gatorade and water and to call me back if he does not improve.  Pease advise of any other instructions.

## 2018-02-16 NOTE — Telephone Encounter (Signed)
Add him on if you can [place]. Thanks

## 2018-02-16 NOTE — Telephone Encounter (Signed)
Patient agrees to appointment at 3 pm today. He reports Imodium AD has calmed things down

## 2018-02-16 NOTE — Telephone Encounter (Signed)
He will try Imodium AD first and call back if needs to be seen

## 2018-02-17 ENCOUNTER — Telehealth: Payer: Self-pay | Admitting: *Deleted

## 2018-02-17 DIAGNOSIS — Z5111 Encounter for antineoplastic chemotherapy: Secondary | ICD-10-CM | POA: Diagnosis not present

## 2018-02-17 LAB — GASTROINTESTINAL PANEL BY PCR, STOOL (REPLACES STOOL CULTURE)

## 2018-02-17 LAB — C DIFFICILE QUICK SCREEN W PCR REFLEX
C Diff antigen: NEGATIVE
C Diff interpretation: NOT DETECTED
C Diff toxin: NEGATIVE

## 2018-02-17 NOTE — Telephone Encounter (Signed)
Do you mind trying to call him back? Thanks.

## 2018-02-17 NOTE — Telephone Encounter (Signed)
Patient called and states someone called him about results form yesterday and he does not understand what it means. He requests a return call to explain it to him. [phone]

## 2018-02-17 NOTE — Progress Notes (Signed)
Stool was clear. Diarrhea appears to be getting better once he started OTC imodium. Remains afebrile.Has follow-up with you this week. Just wanted to let you know.   Faythe Casa, NP 02/17/2018 11:34 AM

## 2018-02-20 ENCOUNTER — Inpatient Hospital Stay: Payer: Medicare Other

## 2018-02-20 ENCOUNTER — Other Ambulatory Visit: Payer: Self-pay

## 2018-02-20 ENCOUNTER — Encounter: Payer: Self-pay | Admitting: Internal Medicine

## 2018-02-20 ENCOUNTER — Inpatient Hospital Stay (HOSPITAL_BASED_OUTPATIENT_CLINIC_OR_DEPARTMENT_OTHER): Payer: Medicare Other | Admitting: Internal Medicine

## 2018-02-20 VITALS — BP 125/74 | HR 67 | Temp 97.3°F | Resp 20 | Ht 70.0 in | Wt 201.0 lb

## 2018-02-20 DIAGNOSIS — E1122 Type 2 diabetes mellitus with diabetic chronic kidney disease: Secondary | ICD-10-CM

## 2018-02-20 DIAGNOSIS — Z79899 Other long term (current) drug therapy: Secondary | ICD-10-CM

## 2018-02-20 DIAGNOSIS — I129 Hypertensive chronic kidney disease with stage 1 through stage 4 chronic kidney disease, or unspecified chronic kidney disease: Secondary | ICD-10-CM

## 2018-02-20 DIAGNOSIS — C459 Mesothelioma, unspecified: Secondary | ICD-10-CM

## 2018-02-20 DIAGNOSIS — R197 Diarrhea, unspecified: Secondary | ICD-10-CM

## 2018-02-20 DIAGNOSIS — N183 Chronic kidney disease, stage 3 (moderate): Secondary | ICD-10-CM

## 2018-02-20 DIAGNOSIS — Z5111 Encounter for antineoplastic chemotherapy: Secondary | ICD-10-CM | POA: Diagnosis not present

## 2018-02-20 LAB — COMPREHENSIVE METABOLIC PANEL
ALBUMIN: 3.7 g/dL (ref 3.5–5.0)
ALK PHOS: 71 U/L (ref 38–126)
ALT: 15 U/L — ABNORMAL LOW (ref 17–63)
AST: 17 U/L (ref 15–41)
Anion gap: 13 (ref 5–15)
BUN: 36 mg/dL — AB (ref 6–20)
CALCIUM: 8.9 mg/dL (ref 8.9–10.3)
CHLORIDE: 108 mmol/L (ref 101–111)
CO2: 19 mmol/L — AB (ref 22–32)
CREATININE: 1.85 mg/dL — AB (ref 0.61–1.24)
GFR calc Af Amer: 40 mL/min — ABNORMAL LOW (ref >60)
GFR calc non Af Amer: 34 mL/min — ABNORMAL LOW (ref >60)
GLUCOSE: 126 mg/dL — AB (ref 65–99)
Potassium: 4 mmol/L (ref 3.5–5.1)
SODIUM: 140 mmol/L (ref 135–145)
Total Bilirubin: 0.5 mg/dL (ref 0.3–1.2)
Total Protein: 6.5 g/dL (ref 6.5–8.1)

## 2018-02-20 LAB — CBC WITH DIFFERENTIAL/PLATELET
BASOS PCT: 1 %
Basophils Absolute: 0 10*3/uL (ref 0–0.1)
EOS ABS: 0.1 10*3/uL (ref 0–0.7)
Eosinophils Relative: 2 %
HCT: 34.2 % — ABNORMAL LOW (ref 40.0–52.0)
Hemoglobin: 12.1 g/dL — ABNORMAL LOW (ref 13.0–18.0)
LYMPHS ABS: 0.9 10*3/uL — AB (ref 1.0–3.6)
Lymphocytes Relative: 28 %
MCH: 31.5 pg (ref 26.0–34.0)
MCHC: 35.4 g/dL (ref 32.0–36.0)
MCV: 89.1 fL (ref 80.0–100.0)
MONO ABS: 0.5 10*3/uL (ref 0.2–1.0)
MONOS PCT: 15 %
Neutro Abs: 1.8 10*3/uL (ref 1.4–6.5)
Neutrophils Relative %: 54 %
Platelets: 199 10*3/uL (ref 150–440)
RBC: 3.84 MIL/uL — ABNORMAL LOW (ref 4.40–5.90)
RDW: 12.3 % (ref 11.5–14.5)
WBC: 3.3 10*3/uL — ABNORMAL LOW (ref 3.8–10.6)

## 2018-02-20 NOTE — Assessment & Plan Note (Addendum)
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 Patient currently on Keytruda; S/P 3 cycles-March 2019 CT chest Progression [stable supra diaphragmatic mass; however increasing size of the subdiaphragmatic mass; subcentimeter lung nodule increasing left lower lobe].  Patient continues to be on Keytruda given clinical benefit/absence of good further treatment options.  If progression noted on subsequent scans-gemcitabine would be the next option.  #Recommend holding Keytruda today cycle #4 [given recent diarrhea-see discussion below].  Patient's creatinine elevated at 1.85; baseline around 1.5  #Diarrhea-unclear etiology; neg c/diff/GI PCR; improved with Imodium.  Question related to [facility].  Given the improvement of diarrhea-recommend holding off any steroids at this time.  Monitor closely; if reoccurs recommend prompt initiation of steroids.  #TSH/baseline -TSH 6 with normal free T4 free T3-monitor for now  # CKD- stage III creat 1.85; worse-likely secondary diarrhea/prerenal.  Encourage increased fluid intake.   #Hold treatment today; follow-up in 1 week labs BMP treatment

## 2018-02-20 NOTE — Progress Notes (Signed)
Right not to proceed [place] NOTE  Patient Care Team: Derinda Late, MD as PCP - General (Family Medicine) Margaretha Sheffield, MD (Otolaryngology) Telford Nab, RN as Registered Nurse Nestor Lewandowsky, MD as Referring Physician (Cardiothoracic Surgery) Cammie Sickle, MD as Consulting Physician (Internal Medicine)  CHIEF COMPLAINTS/PURPOSE OF CONSULTATION: Malignant pleural effusion.   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 PN  # MOLECULAR TESTING- PDL-1- 1% [LOW]; [facility]- No actionable**     [name], malignant ([place])    HISTORY OF PRESENTING ILLNESS:  Ian Shaffer 72 y.o.  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  After the last cycle of treatment ~approximately 2 weeks later patient noted to have diarrhea/loose stools-about 6 a day [at peak]; denies any significant abdominal cramping or blood in stools.  Patient stopped his PPI; and started Imodium.  Currently resolved; his last loose stool was yesterday.   Patient denies any nausea vomiting.  Denies any abdominal discomfort or bloating.  He states to be drinking of fluids.  Not lost any weight appetite is fair.  ROS: A complete 10 point review of system is done which is negative except mentioned above in  history of present illness  MEDICAL HISTORY:  Past Medical History:  Diagnosis Date  . Benign essential hypertension 05/31/2014  . Cancer ([place]) 06/2017   right lung   . Cerebral infarction ([place]) 05/31/2014  . Cerebrovascular disease 05/31/2014  . CKD (chronic kidney disease) stage 3, GFR 30-59 ml/min (HCC) 02/23/2016  . CVA (cerebral infarction)   . Diabetes mellitus without complication ([place])   . Difficult intubation   . DVT (deep venous thrombosis) ([place])   . Esophageal reflux 05/31/2014  . H/O: CVA (cerebrovascular accident)   . Hyperlipidemia   . Hypertension   . ICAO (internal carotid artery occlusion) March 01, 2014   Left  . 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 Pleural effusion 04/23/2017  . Stroke [facility]) April, 4,2015  . Type 2 diabetes mellitus with peripheral neuropathy ([place]) 02/23/2016  . Weakness     SURGICAL HISTORY: Past Surgical History:  Procedure Laterality Date  . CHEST TUBE INSERTION N/A 06/25/2017   Procedure: INOMVE CATHETER INSERTION;  Surgeon: Nestor Lewandowsky, MD;  Location: [facility] ORS;  Service: Thoracic;  Laterality: N/A;  . CHEST TUBE INSERTION Right 08/07/2017   Procedure: REMOVAL OF PLEUR X CATHETER;  Surgeon: Nestor Lewandowsky, MD;  Location: [facility] ORS;  Service: General;  Laterality: Right;  . NASAL SINUS SURGERY  2000  . TONSILLECTOMY    . VIDEO ASSISTED THORACOSCOPY Right 06/25/2017   Procedure: VIDEO ASSISTED THORACOSCOPY WITH TALC PLEURODESIS, CHEST TUBE INSERTION;  Surgeon: Nestor Lewandowsky, MD;  Location: [facility] ORS;  Service: Thoracic;  Laterality: Right;    SOCIAL HISTORY: no smoke/ no alcohol ; [place] years ago; retd [employer]; but  dad had furnace- ? Few years in child hood;Exposure to agent orange in [place]. Has no children.  Social History   Socioeconomic History  . Marital status: Married    Spouse name: Not on file  . Number of children: Not on file  . Years of education: Not on  file  . Highest education level: Not on file  Occupational History  . Not on file  Social Needs  . Financial resource strain: Not on file  . Food insecurity:    Worry: Not on file    Inability: Not on file  . Transportation needs:    Medical: Not on file    Non-medical: Not on file  Tobacco Use  . Smoking status: Former Smoker    Types: Pipe    Last attempt to quit: 05/07/1974    Years since quitting: 43.8  . Smokeless tobacco: Never Used  . Tobacco comment: pt states he smoked a pipe a long time ago  Substance and Sexual Activity  . Alcohol use: No  . Drug use: No  . Sexual activity: Not on file  Lifestyle  . Physical activity:    Days per week: Not on file    Minutes per session: Not on file  . Stress: Not on file  Relationships  . Social connections:    Talks on phone: Not on file    Gets together: Not on file    Attends religious service: Not on file    Active member of club or organization: Not on file    Attends meetings of clubs or organizations: Not on file    Relationship status: Not on file  . Intimate partner violence:    Fear of current or ex partner: Not on file    Emotionally abused: Not on file    Physically abused: Not on file    Forced sexual activity: Not on file  Other Topics Concern  . Not on file  Social History Narrative  . Not on file    FAMILY HISTORY: Family History  Problem Relation Age of Onset  . Hypertension Mother   . Hypertension Father     ALLERGIES:  is allergic to sulfa antibiotics; adhesive [tape]; amoxicillin; and other.  MEDICATIONS:  Current Outpatient Medications  Medication Sig Dispense Refill  . amLODipine (NORVASC) 5 MG tablet Take 5 mg by mouth daily.    Marland Kitchen aspirin EC 81 MG tablet Take 81 mg by mouth daily.    . clopidogrel (PLAVIX) 75 MG tablet Take 75 mg by mouth daily.    . fluticasone (FLONASE) 50 MCG/ACT nasal spray Place 1 spray into both nostrils daily at 2 PM.     . Homeopathic Products (NERVE PAIN RELIEF  SL) Take 1 tablet by mouth daily. NERVE RENEW (METHYLCOBALAMIN-BENFOTIAMINE-STABILIZED R-ALPHA LIPOIC ACID)    . liraglutide (VICTOZA) 18 MG/3ML SOPN Inject 1.2 mg into the skin daily.    Marland Kitchen lisinopril (PRINIVIL,ZESTRIL) 10 MG tablet Take 10 mg by mouth daily.    . magic mouthwash w/lidocaine SOLN Take 10 mLs by mouth 3 (three) times daily. 300 mL 0  . metoprolol succinate (TOPROL-XL) 25 MG 24 hr tablet Take 25 mg by mouth daily.    . Multiple Vitamins-Minerals (CENTRUM SILVER PO) Take 1 tablet by mouth daily.    . pravastatin (PRAVACHOL) 40 MG tablet Take 40 mg by mouth daily.    . lansoprazole (PREVACID) 30 MG capsule Take 1 capsule (30 mg total) by mouth daily before breakfast. (Patient not taking: Reported on  02/20/2018) 30 capsule 4  . ondansetron (ZOFRAN) 8 MG tablet Take 8 mg by mouth every 8 (eight) hours as needed for nausea or refractory nausea / vomiting.     . polyethylene glycol powder (GLYCOLAX/MIRALAX) powder Take 17 g by mouth daily as needed (for constipation.).    Marland Kitchen prochlorperazine (COMPAZINE) 10 MG tablet Take 10 mg by mouth every 8 (eight) hours as needed for vomiting.      No current facility-administered medications for this visit.       Marland Kitchen  PHYSICAL EXAMINATION: ECOG PERFORMANCE STATUS: 0 - Asymptomatic  Vitals:   02/20/18 0844  BP: 125/74  Pulse: 67  Resp: 20  Temp: (!) 97.3 F (36.3 C)   Filed Weights   02/20/18 0844  Weight: 201 lb (91.2 kg)    GENERAL: Well-nourished well-developed; Alert, no distress and comfortable.  He is accompanied by his wife. EYES: no pallor or icterus OROPHARYNX: no thrush or ulceration; good dentition  NECK: supple, no masses felt LYMPH:  no palpable lymphadenopathy in the cervical, axillary or inguinal regions LUNGS: Decreased breath sounds on the right side compared to the left. No wheeze or crackles HEART/CVS: regular rate & rhythm and no murmurs; No lower extremity edema ABDOMEN: abdomen soft, non-tender and normal bowel  sounds Musculoskeletal:no cyanosis of digits and no clubbing  PSYCH: alert & oriented x 3 with fluent speech NEURO: no focal motor/sensory deficits SKIN:  no rashes or significant lesions  LABORATORY DATA:  I have reviewed the data as listed Lab Results  Component Value Date   WBC 3.3 (L) 02/20/2018   HGB 12.1 (L) 02/20/2018   HCT 34.2 (L) 02/20/2018   MCV 89.1 02/20/2018   PLT 199 02/20/2018   Recent Labs    01/09/18 0918 01/30/18 0900 02/20/18 0828  NA 139 139 140  K 4.9 5.2* 4.0  CL 104 105 108  CO2 28 29 19*  GLUCOSE 116* 128* 126*  BUN 34* 33* 36*  CREATININE 1.64* 1.55* 1.85*  CALCIUM 9.4 9.7 8.9  GFRNONAA 40* 43* 34*  GFRAA 46* 50* 40*  PROT 6.8 6.8 6.5  ALBUMIN 3.9 4.0 3.7  AST _0 ALT 20 19 15*  ALKPHOS 90 77 71  BILITOT 0.4 0.4 0.5    RADIOGRAPHIC STUDIES: I have personally reviewed the radiological images as listed and agreed with the findings in the report. Ct Chest Wo Contrast  Result Date: 01/28/2018 CLINICAL DATA:  [name] 7/18.  Immunotherapy.  Asymptomatic. EXAM: CT CHEST WITHOUT CONTRAST TECHNIQUE: Multidetector CT imaging of the chest was performed following the standard protocol without IV contrast. COMPARISON:  10/23/2017 FINDINGS: Cardiovascular: Aortic atherosclerosis. Tortuous thoracic aorta. Normal heart size, without pericardial effusion. Multivessel coronary artery atherosclerosis. Mediastinum/Nodes: Hypoattenuating 12 mm right thyroid nodule is nonspecific and present on the prior. No supraclavicular adenopathy. Right paratracheal node is borderline enlarged, similar at 10 mm on image 39/2. Hilar regions poorly evaluated without intravenous contrast. Lungs/Pleura: Right greater than left pleural thickening is mild. No pleural fluid. Pleural-based partially calcified lesion in the inferior right hemithorax measures 6.3 x 4.0 cm on image 116/2. Compare 7.5 x 5.1 cm at the same level on the prior. Linear right upper lobe scarring. Minimal  pleural-based nodularity in the right lower lobe at 3 mm on image 94/3, new. Left lower lobe pulmonary nodule measures 6 mm on image 84/3 and is enlarged from 1 mm on the prior. Upper Abdomen: Direct extension of pleural-based mass into the right lobe of the liver.  Hypoattenuating component is somewhat difficult to define secondary to noncontrast technique. On the order of 7.3 x 5.4 cm on image 128/2. Compare 5.3 x 5.0 cm at the same level on the prior. Normal imaged portions of the spleen, stomach, pancreas, gallbladder, biliary tract, adrenal glands, kidneys. Musculoskeletal: No acute osseous abnormality. IMPRESSION: 1. Mixed response to therapy, with overall disease progression. 2. Decrease in right-sided pleural-based component of infiltrative mass. Progression of subdiaphragmatic component with direct extension into the liver. 3. Enlarging left lower lobe pulmonary nodule, highly suspicious for pulmonary metastasis. 4. Coronary artery atherosclerosis. Aortic Atherosclerosis (ICD10-I70.0). Electronically Signed   By: Abigail Miyamoto M.D.   On: 01/28/2018 10:37    ASSESSMENT & PLAN:   [name], malignant ([place]) # [place] mesothelioma- [approximately 10 cm mass- extending above and below the diaphragm on the right side]. Patient currently on Keytruda; S/P 3 cycles-March 2019 CT chest Progression [stable supra diaphragmatic mass; however increasing size of the subdiaphragmatic mass; subcentimeter lung nodule increasing left lower lobe].  Patient continues to be on Keytruda given clinical benefit/absence of good further treatment options.  If progression noted on subsequent scans-gemcitabine would be the next option.  #Recommend holding Keytruda today cycle #4 [given recent diarrhea-see discussion below].  Patient's creatinine elevated at 1.85; baseline around 1.5  #Diarrhea-unclear etiology; neg c/diff/GI PCR; improved with Imodium.  Question related to [facility].  Given the improvement of  diarrhea-recommend holding off any steroids at this time.  Monitor closely; if reoccurs recommend prompt initiation of steroids.  #TSH/baseline -TSH 6 with normal free T4 free T3-monitor for now  # CKD- stage III creat 1.85; worse-likely secondary diarrhea/prerenal.  Encourage increased fluid intake.   #Hold treatment today; follow-up in 1 week labs BMP treatment   All questions were answered. The patient knows to call the clinic with any problems, questions or concerns.   Cammie Sickle, MD 02/20/2018 1:14 PM

## 2018-02-20 NOTE — Progress Notes (Signed)
Patient here for follow-up and for [place]. Patient reports resolution of episodes of diarrhea with the use of imodium and stopping prevacid.

## 2018-02-23 ENCOUNTER — Telehealth: Payer: Self-pay | Admitting: *Deleted

## 2018-02-23 DIAGNOSIS — K521 Toxic gastroenteritis and colitis: Secondary | ICD-10-CM

## 2018-02-23 MED ORDER — PREDNISONE 20 MG PO TABS: 60.0000 mg | ORAL_TABLET | Freq: Every day | ORAL | 0 refills | Status: DC

## 2018-02-23 NOTE — Telephone Encounter (Signed)
Returned call to patient and informed of physician response. He states he has no control and had an accident. I explained with his diarrhea that the steroids is what he needs and it should take care of it. He voiced understanding of this and that he will not get Keytruda but rather IV fluids on Friday

## 2018-02-23 NOTE — Telephone Encounter (Signed)
Patient called to report that he is still having diarrhea and that D rB told him to stop the Imodium. He is asking what else he can take for the diarrhea. Please advise

## 2018-02-23 NOTE — Telephone Encounter (Signed)
MD sent in new prescription for prednisone 60 mg daily to patient's pharmacy. Please advise patient to take the steroids  with Food. Patient will be on this dosing x 1 week, then MD will provide new instructions at office visit on Friday 02/27/18.  Will cnl keytruda on Friday, but pt may need IV fluids/supportive meds

## 2018-02-24 ENCOUNTER — Telehealth: Payer: Self-pay | Admitting: Internal Medicine

## 2018-02-24 NOTE — Telephone Encounter (Signed)
I contacted patient and he states that he is feeling much better today, and that his diarrhea symptoms are doing better. He states that he is starting to notice more form to his stools, and that he will continue to take prednisone as directed.  I advised patient to keep [place] updated on his symptoms, and if they do not improve - patient verbalized understanding & states he will let us know if things get worse or do not improve.

## 2018-02-24 NOTE — Telephone Encounter (Signed)
I called the patient to check on his diarrhea; patient started on prednisone yesterday for possible immune mediated diarrhea.  Left a message for the patient to call us back if having continued diarrhea.  If clinically not doing well-recommend evaluation at the symptom management clinic today.

## 2018-02-24 NOTE — Telephone Encounter (Signed)
I attempted to contact patient again, but phone line kept ringing and I was unable to reach. I will try again later.

## 2018-02-25 ENCOUNTER — Other Ambulatory Visit: Payer: Self-pay | Admitting: Internal Medicine

## 2018-02-27 ENCOUNTER — Encounter: Payer: Self-pay | Admitting: Internal Medicine

## 2018-02-27 ENCOUNTER — Inpatient Hospital Stay: Payer: Medicare Other

## 2018-02-27 ENCOUNTER — Inpatient Hospital Stay: Payer: Medicare Other | Attending: Internal Medicine

## 2018-02-27 ENCOUNTER — Inpatient Hospital Stay (HOSPITAL_BASED_OUTPATIENT_CLINIC_OR_DEPARTMENT_OTHER): Payer: Medicare Other | Admitting: Internal Medicine

## 2018-02-27 VITALS — BP 149/89 | HR 67 | Temp 97.9°F | Resp 16 | Wt 204.8 lb

## 2018-02-27 DIAGNOSIS — I679 Cerebrovascular disease, unspecified: Secondary | ICD-10-CM | POA: Insufficient documentation

## 2018-02-27 DIAGNOSIS — K219 Gastro-esophageal reflux disease without esophagitis: Secondary | ICD-10-CM | POA: Insufficient documentation

## 2018-02-27 DIAGNOSIS — C459 Mesothelioma, unspecified: Secondary | ICD-10-CM

## 2018-02-27 DIAGNOSIS — E785 Hyperlipidemia, unspecified: Secondary | ICD-10-CM | POA: Diagnosis not present

## 2018-02-27 DIAGNOSIS — J90 Pleural effusion, not elsewhere classified: Secondary | ICD-10-CM | POA: Diagnosis not present

## 2018-02-27 DIAGNOSIS — I129 Hypertensive chronic kidney disease with stage 1 through stage 4 chronic kidney disease, or unspecified chronic kidney disease: Secondary | ICD-10-CM | POA: Diagnosis not present

## 2018-02-27 DIAGNOSIS — Z79899 Other long term (current) drug therapy: Secondary | ICD-10-CM | POA: Diagnosis not present

## 2018-02-27 DIAGNOSIS — Z8673 Personal history of transient ischemic attack (TIA), and cerebral infarction without residual deficits: Secondary | ICD-10-CM | POA: Insufficient documentation

## 2018-02-27 DIAGNOSIS — E1122 Type 2 diabetes mellitus with diabetic chronic kidney disease: Secondary | ICD-10-CM | POA: Diagnosis not present

## 2018-02-27 DIAGNOSIS — Z7982 Long term (current) use of aspirin: Secondary | ICD-10-CM | POA: Insufficient documentation

## 2018-02-27 DIAGNOSIS — K529 Noninfective gastroenteritis and colitis, unspecified: Secondary | ICD-10-CM | POA: Insufficient documentation

## 2018-02-27 DIAGNOSIS — Z87891 Personal history of nicotine dependence: Secondary | ICD-10-CM | POA: Insufficient documentation

## 2018-02-27 DIAGNOSIS — N183 Chronic kidney disease, stage 3 (moderate): Secondary | ICD-10-CM | POA: Diagnosis not present

## 2018-02-27 DIAGNOSIS — Z7189 Other specified counseling: Secondary | ICD-10-CM

## 2018-02-27 LAB — BASIC METABOLIC PANEL
ANION GAP: 8 (ref 5–15)
BUN: 31 mg/dL — ABNORMAL HIGH (ref 6–20)
CHLORIDE: 108 mmol/L (ref 101–111)
CO2: 24 mmol/L (ref 22–32)
Calcium: 9.1 mg/dL (ref 8.9–10.3)
Creatinine, Ser: 1.72 mg/dL — ABNORMAL HIGH (ref 0.61–1.24)
GFR calc Af Amer: 44 mL/min — ABNORMAL LOW (ref >60)
GFR calc non Af Amer: 38 mL/min — ABNORMAL LOW (ref >60)
GLUCOSE: 119 mg/dL — AB (ref 65–99)
POTASSIUM: 3.6 mmol/L (ref 3.5–5.1)
Sodium: 140 mmol/L (ref 135–145)

## 2018-02-27 NOTE — Patient Instructions (Signed)
Starting tomorrow for [4/6 ] take prednisone 2 pills a day for 1 week; and then take 1 pill a day for 1 week; and then stop.

## 2018-02-27 NOTE — Assessment & Plan Note (Addendum)
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 Patient currently on Keytruda; S/P 3 cycles-March 2019 CT chest Progression [stable supra diaphragmatic mass; however increasing size of the subdiaphragmatic mass; subcentimeter lung nodule increasing left lower lobe].    #Patient currently status post 4 cycles of Keytruda; however given the ongoing diarrhea-colitis [ from [place] /the recent progression noted on CT scan in March 2019 I would recommend discontinuation of Keytruda.  #Recommend proceeding with gemcitabine starting with next cycle approximately 3 weeks; labs.  Discussed the potential side effects of skin rash; possible fevers etc. discussed treatments are palliative not curative.   #Diarrhea-ikley related to [facility]; on prednisone 60mg /day improved; taper steroids over the next 2 weeks.  Instructions written- # Starting tomorrow for [4/6 ] take prednisone 2 pills a day for 1 week; and then take 1 pill a day for 1 week; and then stop.  #TSH/baseline -TSH 6 with normal free T4 free T3-monitor for now  # CKD- stage III creat 1.85; improved/ 1.7; baseline 1.5.  Continue increased fluid intake.  # Discon- Keytruda; Gem in 3 weeks; labs- cbc/cmp.

## 2018-02-27 NOTE — Progress Notes (Signed)
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 PN  # MOLECULAR TESTING- PDL-1- 1% [LOW]; [facility]- No actionable**     [name], malignant ([place])   03/19/2018 -  Chemotherapy    The patient had gemcitabine (GEMZAR) 2,128 mg in sodium chloride 0.9 % 250 mL chemo infusion, 1,000 mg/m2, Intravenous,  Once, 0 of 4 cycles  for chemotherapy treatment.        HISTORY OF PRESENTING ILLNESS:  Ian Shaffer 72 y.o.  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  Patient cycle #5 was held- Because of diarrhea/and worsening renal function.  Patient was also started on steroids prednisone 60 mg a day for the last 5 days; noted to have significant improvement of the  diarrhea.  He had 2 stools with a fairly formed.  Patient denies any nausea vomiting.  Appetite is fair. States his blood sugars are slightly elevated but not too high.  ROS: A complete 10 point review of system is done which is negative except mentioned above in history of present illness  MEDICAL HISTORY:  Past Medical History:  Diagnosis Date  . Benign essential hypertension 05/31/2014  . Cancer ([place]) 06/2017   right lung   . Cerebral infarction ([place]) 05/31/2014  . Cerebrovascular disease 05/31/2014  . CKD (chronic kidney disease) stage 3, GFR 30-59 ml/min (HCC) 02/23/2016  . CVA (cerebral infarction)   . Diabetes mellitus without complication ([place])   . Difficult intubation   . DVT (deep venous thrombosis) ([place])   . Esophageal reflux 05/31/2014  . H/O: CVA (cerebrovascular accident)   . Hyperlipidemia   . Hypertension   . ICAO (internal carotid artery occlusion) March 01, 2014   Left  . 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 Pleural effusion 04/23/2017  . Stroke [facility]) April, 4,2015  . Type 2 diabetes mellitus with peripheral neuropathy ([place]) 02/23/2016  . Weakness     SURGICAL HISTORY: Past Surgical History:  Procedure Laterality Date  . CHEST TUBE INSERTION N/A 06/25/2017   Procedure: KYHCWC CATHETER INSERTION;  Surgeon: Nestor Lewandowsky, MD;  Location: [facility] ORS;  Service: Thoracic;  Laterality: N/A;  . CHEST TUBE INSERTION Right 08/07/2017   Procedure: REMOVAL OF PLEUR X CATHETER;  Surgeon: Nestor Lewandowsky, MD;  Location: [facility] ORS;  Service: General;  Laterality: Right;  . NASAL SINUS SURGERY  2000  . TONSILLECTOMY    . VIDEO ASSISTED THORACOSCOPY  Right 06/25/2017   Procedure: VIDEO ASSISTED THORACOSCOPY WITH TALC PLEURODESIS, CHEST TUBE INSERTION;  Surgeon: Nestor Lewandowsky, MD;  Location: [facility] ORS;  Service: Thoracic;  Laterality: Right;    SOCIAL HISTORY: no smoke/ no alcohol ; [place] years ago; retd [employer];  but  dad had furnace- ? Few years in child hood;Exposure to agent orange in [place]. Has no children.  Social History   Socioeconomic History  . Marital status: Married    Spouse name: Not on file  . Number of children: Not on file  . Years of education: Not on file  . Highest education level: Not on file  Occupational History  . Not on file  Social Needs  . Financial resource strain: Not on file  . Food insecurity:    Worry: Not on file    Inability: Not on file  . Transportation needs:    Medical: Not on file    Non-medical: Not on file  Tobacco Use  . Smoking status: Former Smoker    Types: Pipe    Last attempt to quit: 05/07/1974    Years since quitting: 43.8  . Smokeless tobacco: Never Used  . Tobacco comment: pt states he smoked a pipe a long time ago  Substance and Sexual Activity  . Alcohol use: No  . Drug use: No  . Sexual activity: Not on file  Lifestyle  . Physical activity:    Days per week: Not on file    Minutes per session: Not on file  . Stress: Not on file  Relationships  . Social connections:    Talks on phone: Not on file    Gets together: Not on file    Attends religious service: Not on file    Active member of club or organization: Not on file    Attends meetings of clubs or organizations: Not on file    Relationship status: Not on file  . Intimate partner violence:    Fear of current or ex partner: Not on file    Emotionally abused: Not on file    Physically abused: Not on file    Forced sexual activity: Not on file  Other Topics Concern  . Not on file  Social History Narrative  . Not on file    FAMILY HISTORY: Family History  Problem Relation Age of Onset  . Hypertension Mother   . Hypertension Father     ALLERGIES:  is allergic to sulfa antibiotics; adhesive [tape]; amoxicillin; and other.  MEDICATIONS:  Current Outpatient Medications  Medication Sig Dispense Refill  . amLODipine (NORVASC) 5 MG tablet Take 5 mg by mouth daily.     Marland Kitchen aspirin EC 81 MG tablet Take 81 mg by mouth daily.    . clopidogrel (PLAVIX) 75 MG tablet Take 75 mg by mouth daily.    . fluticasone (FLONASE) 50 MCG/ACT nasal spray Place 1 spray into both nostrils daily at 2 PM.     . Homeopathic Products (NERVE PAIN RELIEF SL) Take 1 tablet by mouth daily. NERVE RENEW (METHYLCOBALAMIN-BENFOTIAMINE-STABILIZED R-ALPHA LIPOIC ACID)    . lansoprazole (PREVACID) 30 MG capsule TAKE 1 CAPSULE BY MOUTH DAILY BEFORE BREAKFAST 30 capsule 4  . liraglutide (VICTOZA) 18 MG/3ML SOPN Inject 1.2 mg into the skin daily.    Marland Kitchen lisinopril (PRINIVIL,ZESTRIL) 10 MG tablet Take 10 mg by mouth daily.    . magic mouthwash w/lidocaine SOLN Take 10 mLs by mouth 3 (three) times daily. 300 mL 0  .  metoprolol succinate (TOPROL-XL) 25 MG 24 hr tablet Take 25 mg by mouth daily.    . Multiple Vitamins-Minerals (CENTRUM SILVER PO) Take 1 tablet by mouth daily.    . ondansetron (ZOFRAN) 8 MG tablet Take 8 mg by mouth every 8 (eight) hours as needed for nausea or refractory nausea / vomiting.     . polyethylene glycol powder (GLYCOLAX/MIRALAX) powder Take 17 g by mouth daily as needed (for constipation.).    Marland Kitchen pravastatin (PRAVACHOL) 40 MG tablet Take 40 mg by mouth daily.    . predniSONE (DELTASONE) 20 MG tablet Take 3 tablets (60 mg total) by mouth daily with breakfast. Take with food-as directed 64 tablet 0  . prochlorperazine (COMPAZINE) 10 MG tablet Take 10 mg by mouth every 8 (eight) hours as needed for vomiting.      No current facility-administered medications for this visit.       Marland Kitchen  PHYSICAL EXAMINATION: ECOG PERFORMANCE STATUS: 0 - Asymptomatic  Vitals:   02/27/18 0916  BP: (!) 149/89  Pulse: 67  Resp: 16  Temp: 97.9 F (36.6 C)   Filed Weights   02/27/18 0916  Weight: 204 lb 12.8 oz (92.9 kg)    GENERAL: Well-nourished well-developed; Alert, no distress and comfortable.  He is alone.  EYES: no pallor or icterus OROPHARYNX: no thrush or ulceration; good  dentition  NECK: supple, no masses felt LYMPH:  no palpable lymphadenopathy in the cervical, axillary or inguinal regions LUNGS: Decreased breath sounds on the right side compared to the left. No wheeze or crackles HEART/CVS: regular rate & rhythm and no murmurs; No lower extremity edema ABDOMEN: abdomen soft, non-tender and normal bowel sounds Musculoskeletal:no cyanosis of digits and no clubbing  PSYCH: alert & oriented x 3 with fluent speech NEURO: no focal motor/sensory deficits SKIN:  no rashes or significant lesions  LABORATORY DATA:  I have reviewed the data as listed Lab Results  Component Value Date   WBC 3.3 (L) 02/20/2018   HGB 12.1 (L) 02/20/2018   HCT 34.2 (L) 02/20/2018   MCV 89.1 02/20/2018   PLT 199 02/20/2018   Recent Labs    01/09/18 0918 01/30/18 0900 02/20/18 0828 02/27/18 0855  NA 139 139 140 140  K 4.9 5.2* 4.0 3.6  CL 104 105 108 108  CO2 28 29 19* 24  GLUCOSE 116* 128* 126* 119*  BUN 34* 33* 36* 31*  CREATININE 1.64* 1.55* 1.85* 1.72*  CALCIUM 9.4 9.7 8.9 9.1  GFRNONAA 40* 43* 34* 38*  GFRAA 46* 50* 40* 44*  PROT 6.8 6.8 6.5  --   ALBUMIN 3.9 4.0 3.7  --   AST 23 26 17   --   ALT 20 19 15*  --   ALKPHOS 90 77 71  --   BILITOT 0.4 0.4 0.5  --     RADIOGRAPHIC STUDIES: I have personally reviewed the radiological images as listed and agreed with the findings in the report. No results found.  ASSESSMENT & PLAN:   [name], malignant ([place]) # [place] mesothelioma- [approximately 10 cm mass- extending above and below the diaphragm on the right side]. Patient currently on Keytruda; S/P 3 cycles-March 2019 CT chest Progression [stable supra diaphragmatic mass; however increasing size of the subdiaphragmatic mass; subcentimeter lung nodule increasing left lower lobe].    #Patient currently status post 4 cycles of Keytruda; however given the ongoing diarrhea-colitis [ from [place] /the recent progression noted on CT scan in March 2019 I would  recommend discontinuation of Keytruda.  #  Recommend proceeding with gemcitabine starting with next cycle approximately 3 weeks; labs.  Discussed the potential side effects of skin rash; possible fevers etc. discussed treatments are palliative not curative.   #Diarrhea-ikley related to [facility]; on prednisone 81m/day improved; taper steroids over the next 2 weeks.  Instructions written- # Starting tomorrow for [4/6 ] take prednisone 2 pills a day for 1 week; and then take 1 pill a day for 1 week; and then stop.  #TSH/baseline -TSH 6 with normal free T4 free T3-monitor for now  # CKD- stage III creat 1.85; improved/ 1.7; baseline 1.5.  Continue increased fluid intake.  # Discon- Keytruda; Gem in 3 weeks; labs- cbc/cmp.   All questions were answered. The patient knows to call the clinic with any problems, questions or concerns.   GCammie Sickle MD 02/28/2018 3:46 PM

## 2018-02-28 DIAGNOSIS — Z7189 Other specified counseling: Secondary | ICD-10-CM | POA: Insufficient documentation

## 2018-02-28 NOTE — Progress Notes (Signed)
ON PATHWAY REGIMEN - [name]  No Change  Continue With Treatment as Ordered.     A cycle is every 21 days:     Gemcitabine   **Always confirm dose/schedule in your pharmacy ordering system**    Patient Characteristics: Second Line and Beyond AJCC M Category: M0 AJCC 8 Stage Grouping: IIIB Current evidence of distant metastases<= No AJCC T Category: T4 AJCC N Category: N0 Line of therapy: Second Line and Beyond  Intent of Therapy: Non-Curative / Palliative Intent, Discussed with Patient

## 2018-03-02 ENCOUNTER — Encounter: Payer: Self-pay | Admitting: Pharmacy Technician

## 2018-03-02 NOTE — Progress Notes (Unsigned)
Patient no longer getting Keytruda from [employer]. based on off label use. Last DOS covered is 01/30/2018.

## 2018-03-07 IMAGING — CT CT CHEST W/O CM
1 series · 14 of 34 positions shown, 18 images · non-contrast
Comparison: 09/15/2017 chest CT.  07/09/2017 PET-CT.

CLINICAL DATA: History of pleomorphic malignant neoplasm of the
posterior basilar right pleural space diagnosed May 2017, favoring
malignant mesothelioma, status post chemotherapy, presenting for
restaging.

EXAM:
CT CHEST WITHOUT CONTRAST
TECHNIQUE: Multidetector CT imaging of the chest was performed following the
standard protocol without IV contrast.

[Series 2: thorax · axial · 0.75mm/px · z∈[-674,-400]mm · 14 of 161 slices shown, 18 images]
[im 12/161  mediastinal]
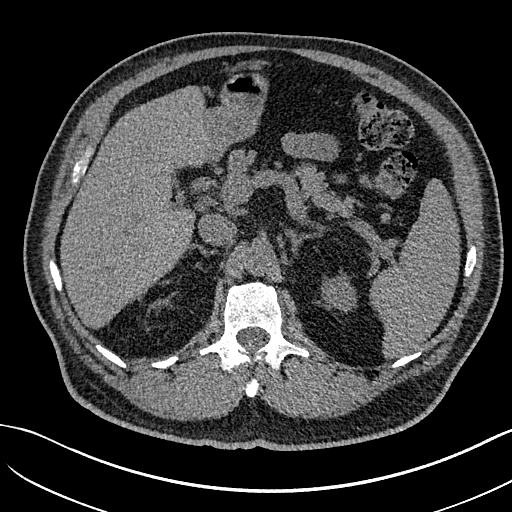
[im 12/161  lung]
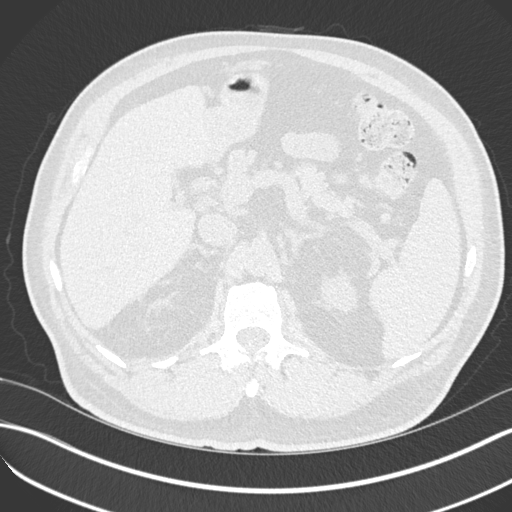
[im 24/161  lung]
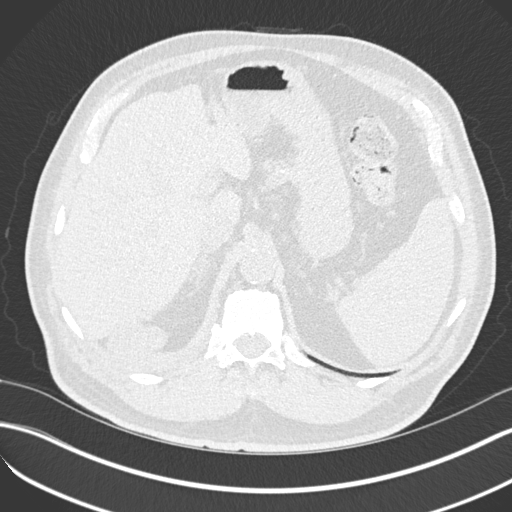
[im 33/161  lung]
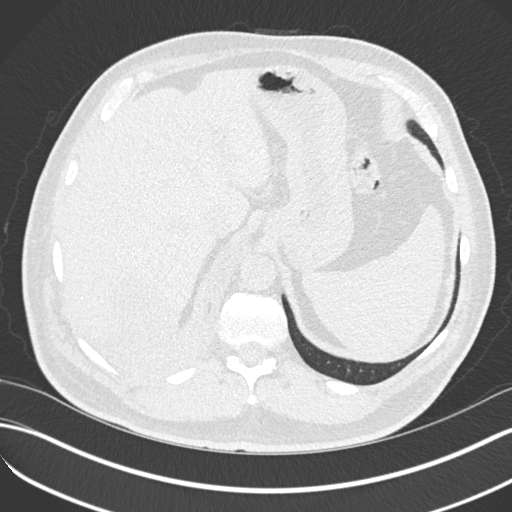
[im 48/161  lung]
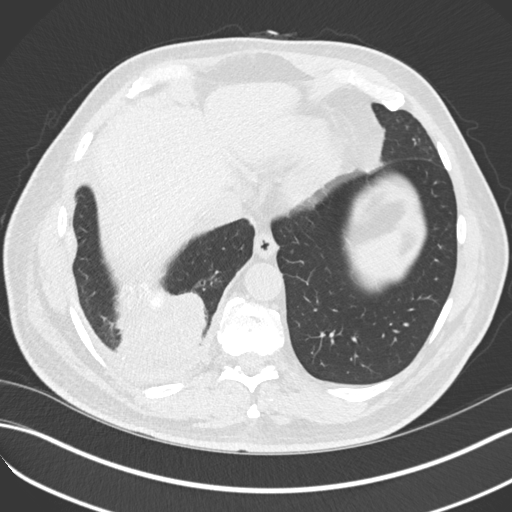
[im 60/161  mediastinal]
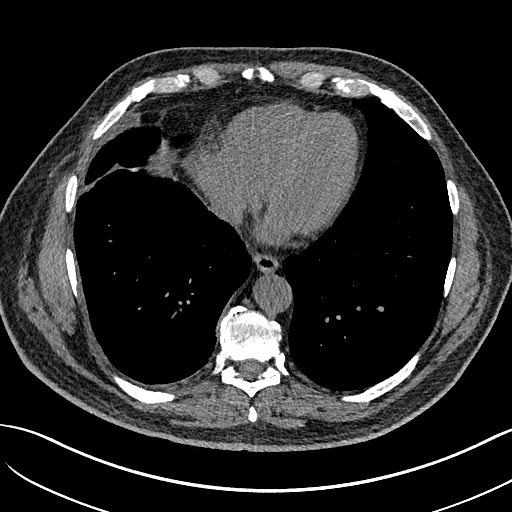
[im 60/161  lung]
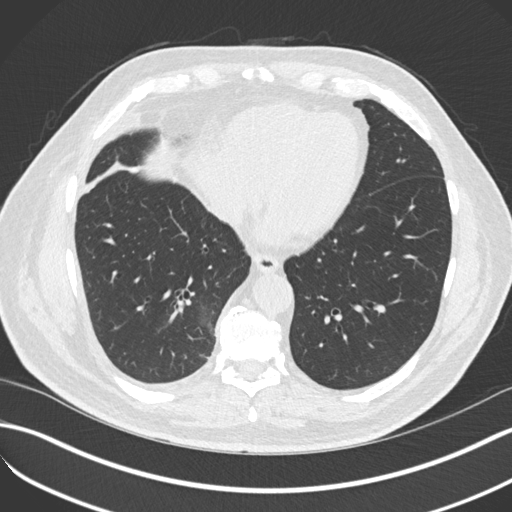
[im 66/161  lung]
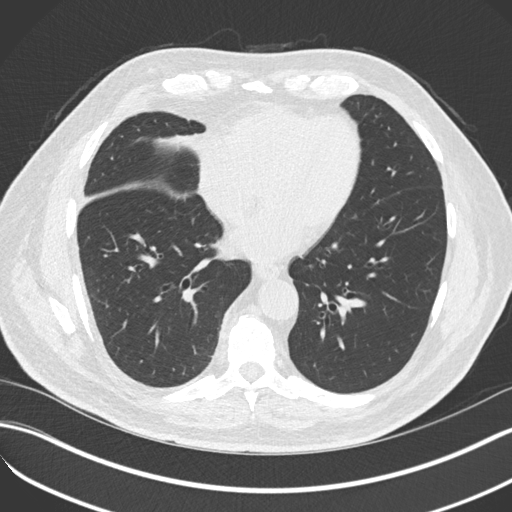
[im 76/161  lung]
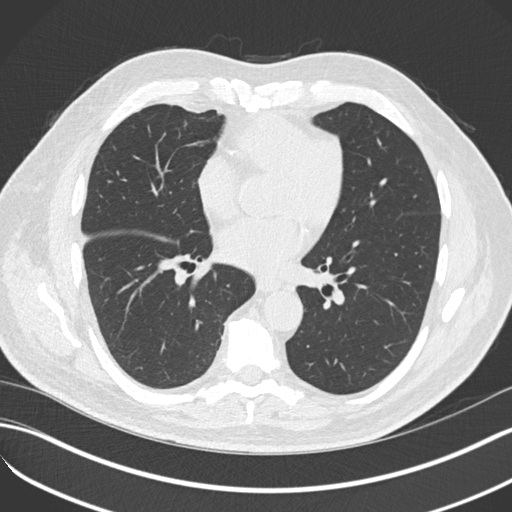
[im 86/161  lung]
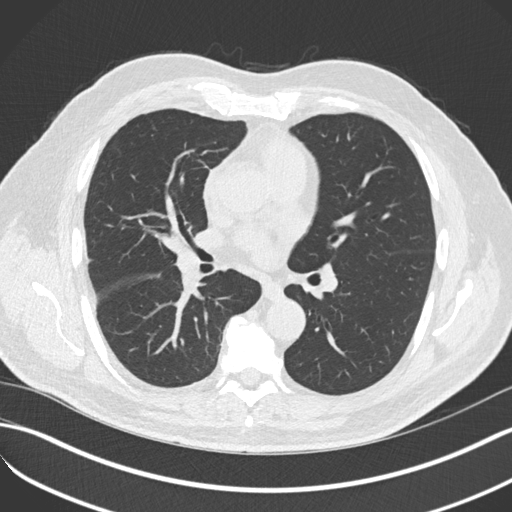
[im 95/161  mediastinal]
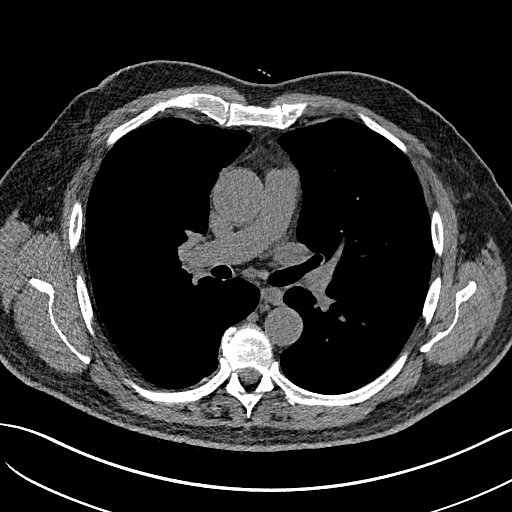
[im 95/161  lung]
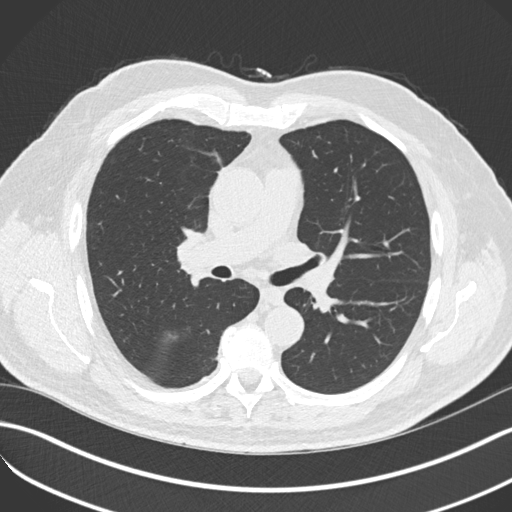
[im 101/161  lung]
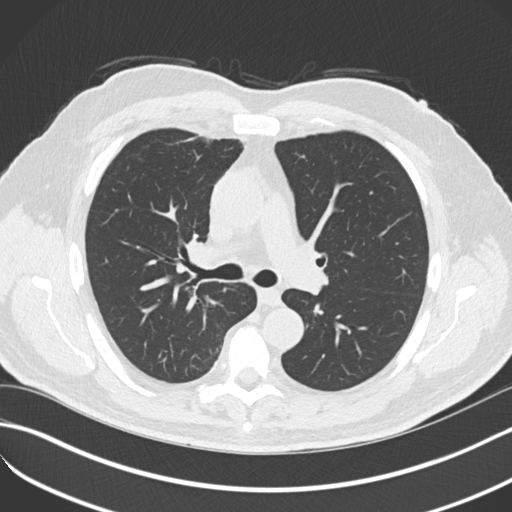
[im 119/161  lung]
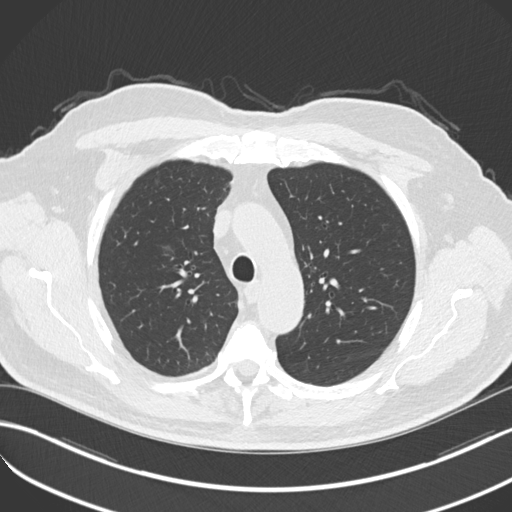
[im 129/161  lung]
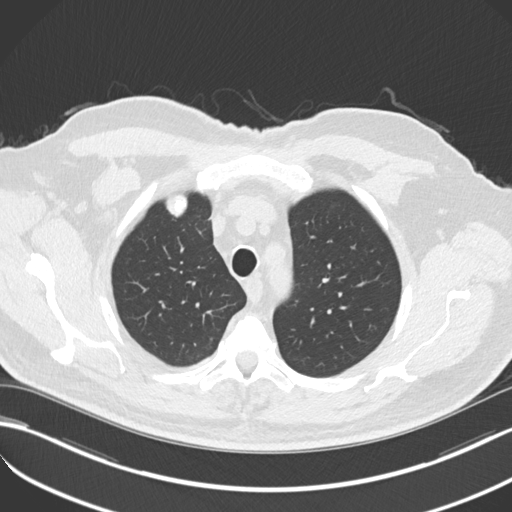
[im 137/161  mediastinal]
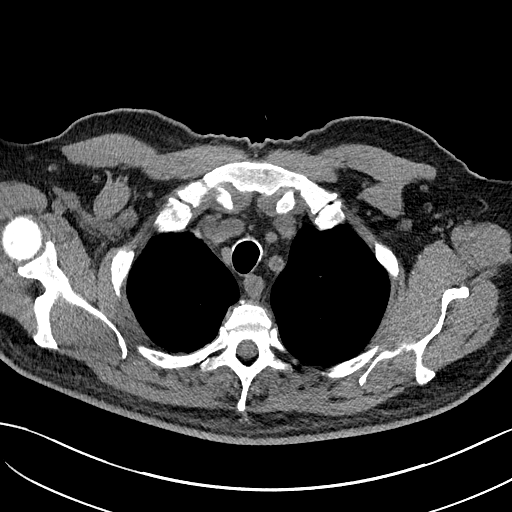
[im 137/161  lung]
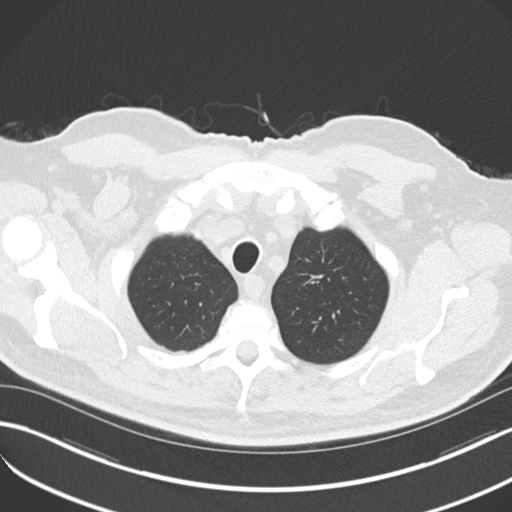
[im 149/161  lung]
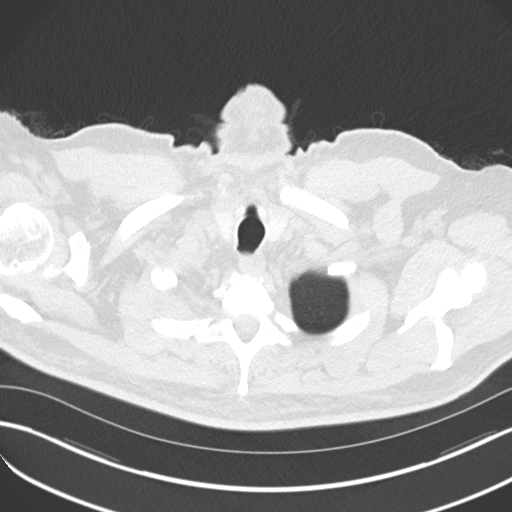

[14 of 34 positions shown; findings below may reference images not displayed]

FINDINGS: Cardiovascular: Normal heart size. No significant pericardial
fluid/thickening. Three-vessel coronary atherosclerosis.
Atherosclerotic nonaneurysmal thoracic aorta. Normal caliber
pulmonary arteries.

Mediastinum/Nodes: Stable subcentimeter hypodense upper right
thyroid lobe nodule. Unremarkable esophagus. No axillary adenopathy.
Stable mild right paratracheal adenopathy measuring up to 1.1 cm
(series 2/ image 37). No new pathologically enlarged mediastinal or
discrete hilar nodes on this noncontrast scan.

Lungs/Pleura: No pneumothorax. No pleural effusion. Posterior
basilar solid right pleural mass measures 7.5 x 5.1 cm (series 2/
image 115), previously 7.8 x 5.1 cm on 09/15/2017 using similar
measurement technique, not appreciably changed. The right
subdiaphragmatic portion of this mass appears increased. The
intrahepatic portion of this mass in the posterior right liver lobe
measures approximately 5.3 x 5.0 cm (series 2/ image 129),
previously 4.6 x 4.2 cm using similar measurement technique,
increased. The extrahepatic subdiaphragmatic portion of this mass
measures 4.7 x 3.0 cm (series 2/ image 138), previously 3.6 x 2.4 cm
using similar measurement technique, increased. Scattered
curvilinear posterior right pleural hyperdensity and pleural
thickening is compatible with history of talc pleurodesis. No acute
consolidative airspace disease or new significant pulmonary nodules.
Previously described right lower lobe 4 mm pulmonary nodule is
absent on today's scan.

Upper abdomen: See above.

Musculoskeletal: No aggressive appearing focal osseous lesions. Mild
thoracic spondylosis.
IMPRESSION: 1. Pleural portion of the infiltrative trans-diaphragmatic posterior
lower right chest mass is stable.
2. Subdiaphragmatic portion of the mass is increased in size with
increased apparent invasion of the posterior right liver lobe.
3. Stable mild right paratracheal adenopathy, which was non
hypermetabolic on 07/09/2017 PET-CT.
4. No new sites of metastatic disease in the chest. Previously
described solitary tiny right lower lobe pulmonary nodule is absent
on this scan.
5. Three-vessel coronary atherosclerosis.

Aortic Atherosclerosis ([id number]-[id number].D).

## 2018-03-17 ENCOUNTER — Telehealth: Payer: Self-pay | Admitting: *Deleted

## 2018-03-17 NOTE — Telephone Encounter (Signed)
Patient called and said that he is at beach and he completed the steroid taper for diarrhea caused by [place] on sat.  He started having diarrhea yest and had about 7 stools and today 3. I called Dr. B and he said to go on 20 mg prednisone daily x 3 days. Call back to clinic tomorrow and give report of diarrhea. Patient is coming back from beach on Thursday morning. I checked back with Dr. B and he said that pt could use imodium if he wants to but the steroids is the main thing to help diarrhea.  I tried calling back and there is no nswer and no voicemail to leave a message

## 2018-03-20 ENCOUNTER — Inpatient Hospital Stay (HOSPITAL_BASED_OUTPATIENT_CLINIC_OR_DEPARTMENT_OTHER): Payer: Medicare Other | Admitting: Internal Medicine

## 2018-03-20 ENCOUNTER — Inpatient Hospital Stay: Payer: Medicare Other

## 2018-03-20 VITALS — BP 152/62 | HR 63 | Temp 97.1°F | Resp 16 | Wt 198.8 lb

## 2018-03-20 DIAGNOSIS — K521 Toxic gastroenteritis and colitis: Secondary | ICD-10-CM

## 2018-03-20 DIAGNOSIS — C459 Mesothelioma, unspecified: Secondary | ICD-10-CM | POA: Diagnosis not present

## 2018-03-20 DIAGNOSIS — K529 Noninfective gastroenteritis and colitis, unspecified: Secondary | ICD-10-CM | POA: Diagnosis not present

## 2018-03-20 DIAGNOSIS — Z79899 Other long term (current) drug therapy: Secondary | ICD-10-CM

## 2018-03-20 LAB — COMPREHENSIVE METABOLIC PANEL
ALT: 21 U/L (ref 17–63)
ANION GAP: 10 (ref 5–15)
AST: 20 U/L (ref 15–41)
Albumin: 3.6 g/dL (ref 3.5–5.0)
Alkaline Phosphatase: 61 U/L (ref 38–126)
BUN: 35 mg/dL — ABNORMAL HIGH (ref 6–20)
CHLORIDE: 107 mmol/L (ref 101–111)
CO2: 23 mmol/L (ref 22–32)
CREATININE: 1.47 mg/dL — AB (ref 0.61–1.24)
Calcium: 9.2 mg/dL (ref 8.9–10.3)
GFR, EST AFRICAN AMERICAN: 53 mL/min — AB (ref >60)
GFR, EST NON AFRICAN AMERICAN: 46 mL/min — AB (ref >60)
Glucose, Bld: 164 mg/dL — ABNORMAL HIGH (ref 65–99)
POTASSIUM: 3.6 mmol/L (ref 3.5–5.1)
SODIUM: 140 mmol/L (ref 135–145)
Total Bilirubin: 0.5 mg/dL (ref 0.3–1.2)
Total Protein: 6.5 g/dL (ref 6.5–8.1)

## 2018-03-20 LAB — CBC WITH DIFFERENTIAL/PLATELET
Basophils Absolute: 0 10*3/uL (ref 0–0.1)
Basophils Relative: 1 %
EOS ABS: 0.1 10*3/uL (ref 0–0.7)
Eosinophils Relative: 2 %
HEMATOCRIT: 33.3 % — AB (ref 40.0–52.0)
HEMOGLOBIN: 11.8 g/dL — AB (ref 13.0–18.0)
LYMPHS PCT: 26 %
Lymphs Abs: 1.6 10*3/uL (ref 1.0–3.6)
MCH: 31.6 pg (ref 26.0–34.0)
MCHC: 35.5 g/dL (ref 32.0–36.0)
MCV: 89.1 fL (ref 80.0–100.0)
Monocytes Absolute: 0.5 10*3/uL (ref 0.2–1.0)
Monocytes Relative: 8 %
NEUTROS ABS: 4 10*3/uL (ref 1.4–6.5)
NEUTROS PCT: 63 %
Platelets: 191 10*3/uL (ref 150–440)
RBC: 3.73 MIL/uL — AB (ref 4.40–5.90)
RDW: 13.6 % (ref 11.5–14.5)
WBC: 6.2 10*3/uL (ref 3.8–10.6)

## 2018-03-20 MED ORDER — PREDNISONE 20 MG PO TABS: ORAL_TABLET | ORAL | 0 refills | Status: DC

## 2018-03-20 NOTE — Assessment & Plan Note (Addendum)
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 #Patient currently status post 4 cycles of Keytruda; however given the ongoing diarrhea-colitis [ from [place] /the recent progression noted on CT scan in March 2019 I would recommend discontinuation of Keytruda.  # Recommend proceeding with gemcitabine starting  in approximately 2 weeks [see discussion below]; labs.  Discussed the potential side effects of skin rash; possible fevers etc. discussed treatments are palliative not curative.   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  Patient to call us if diarrhea worsens.  #Patient inquired regarding clinical trials with CART therapy-we will make a referral to MD [facility] [place] for possible treatment options-given especially patient's great performance status.   #TSH/baseline -TSH 6 with normal free T4 free T3-monitor for now  # CKD- stage III creat 1.85; improved/ 1.7; baseline 1.5.  Continue increased fluid intake.  # HOLD chemo today; folow up in 2 weeks/labs-gem.

## 2018-03-20 NOTE — Progress Notes (Signed)
Right not to proceed [place] NOTE  Patient Care Team: Derinda Late, MD as PCP - General (Family Medicine) Margaretha Sheffield, MD (Otolaryngology) Telford Nab, RN as Registered Nurse Nestor Lewandowsky, MD as Referring Physician (Cardiothoracic Surgery) Cammie Sickle, MD as Consulting Physician (Internal Medicine)  CHIEF COMPLAINTS/PURPOSE OF CONSULTATION: [name]  #  Oncology History   # July-AUG 2018- right pleural based mass ~10 cm [ above & below diaphragm]; s/p VATS- Dr.Oaks-Bx- 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 PN  # MOLECULAR TESTING- PDL-1- 1% [LOW]; [facility] One- No actionable**     [name], malignant ([place])   03/19/2018 -  Chemotherapy    The patient had gemcitabine (GEMZAR) 2,128 mg in sodium chloride 0.9 % 250 mL chemo infusion, 1,000 mg/m2, Intravenous,  Once, 0 of 4 cycles  for chemotherapy treatment.        HISTORY OF PRESENTING ILLNESS:  Ian Shaffer 72 y.o.  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   Patient was started on prednisone for diarrhea likely from [facility]; which improved.  However on tapering off-3  days after stopping prednisone patient noted to have worsening diarrhea up to 4-5 loose stools a day.  Patient was restarted back on prednisone 40 mg a day for the last 4 days-noted to have improvement in the number of loose stools up to 2 a day/more formed.  Intermittent abdominal cramps.  Otherwise no blood in stools no fever chills.  Patient denies any nausea vomiting.  Appetite is fair. States his blood sugars are slightly elevated but not too high.  ROS: A complete 10 point review of system is done which is negative except mentioned above in history of present illness  MEDICAL HISTORY:  Past Medical History:  Diagnosis Date  . Benign essential hypertension 05/31/2014  . Cancer ([place]) 06/2017   right lung   . Cerebral infarction ([place]) 05/31/2014  . Cerebrovascular disease 05/31/2014  . CKD (chronic kidney disease) stage 3, GFR 30-59 ml/min (HCC) 02/23/2016  . CVA (cerebral infarction)   . Diabetes mellitus without complication ([place])   . Difficult intubation   . DVT (deep venous thrombosis) ([place])   . Esophageal reflux 05/31/2014  . H/O: CVA (cerebrovascular accident)   . Hyperlipidemia   . Hypertension   . ICAO (internal carotid artery occlusion) March 01, 2014   Left  . 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 Pleural effusion 04/23/2017  . Stroke [facility]) April, 4,2015  . Type 2 diabetes mellitus with peripheral neuropathy ([place]) 02/23/2016  . Weakness     SURGICAL HISTORY: Past Surgical History:  Procedure Laterality Date  . CHEST TUBE INSERTION N/A 06/25/2017   Procedure: OYDXAJ CATHETER INSERTION;  Surgeon: Nestor Lewandowsky, MD;  Location: [facility] ORS;  Service: Thoracic;  Laterality: N/A;  .  CHEST TUBE INSERTION Right 08/07/2017   Procedure: REMOVAL OF PLEUR X CATHETER;  Surgeon: Nestor Lewandowsky, MD;  Location: [facility] ORS;  Service: General;  Laterality: Right;  . NASAL SINUS SURGERY  2000  . TONSILLECTOMY    . VIDEO ASSISTED THORACOSCOPY Right 06/25/2017   Procedure:  VIDEO ASSISTED THORACOSCOPY WITH TALC PLEURODESIS, CHEST TUBE INSERTION;  Surgeon: Nestor Lewandowsky, MD;  Location: [facility] ORS;  Service: Thoracic;  Laterality: Right;    SOCIAL HISTORY: no smoke/ no alcohol ; [place] 9 [place]; retd [employer]; but  dad had furnace- ? Few years in child hood;Exposure to agent orange in [place]. Has no children.  Social History   Socioeconomic History  . Marital status: Married    Spouse name: Not on file  . Number of children: Not on file  . Years of education: Not on file  . Highest education level: Not on file  Occupational History  . Not on file  Social Needs  . Financial resource strain: Not on file  . Food insecurity:    Worry: Not on file    Inability: Not on file  . Transportation needs:    Medical: Not on file    Non-medical: Not on file  Tobacco Use  . Smoking status: Former Smoker    Types: Pipe    Last attempt to quit: 05/07/1974    Years since quitting: 43.9  . Smokeless tobacco: Never Used  . Tobacco comment: pt states he smoked a pipe a long time ago  Substance and Sexual Activity  . Alcohol use: No  . Drug use: No  . Sexual activity: Not on file  Lifestyle  . Physical activity:    Days per week: Not on file    Minutes per session: Not on file  . Stress: Not on file  Relationships  . Social connections:    Talks on phone: Not on file    Gets together: Not on file    Attends religious service: Not on file    Active member of club or organization: Not on file    Attends meetings of clubs or organizations: Not on file    Relationship status: Not on file  . Intimate partner violence:    Fear of current or ex partner: Not on file    Emotionally abused: Not on file    Physically abused: Not on file    Forced sexual activity: Not on file  Other Topics Concern  . Not on file  Social History Narrative  . Not on file    FAMILY HISTORY: Family History  Problem Relation Age of  Onset  . Hypertension Mother   . Hypertension Father     ALLERGIES:  is allergic to sulfa antibiotics; adhesive [tape]; amoxicillin; and other.  MEDICATIONS:  Current Outpatient Medications  Medication Sig Dispense Refill  . amLODipine (NORVASC) 5 MG tablet Take 5 mg by mouth daily.    Marland Kitchen aspirin EC 81 MG tablet Take 81 mg by mouth daily.    . clopidogrel (PLAVIX) 75 MG tablet Take 75 mg by mouth daily.    . fluticasone (FLONASE) 50 MCG/ACT nasal spray Place 1 spray into both nostrils daily at 2 PM.     . Homeopathic Products (NERVE PAIN RELIEF SL) Take 1 tablet by mouth daily. NERVE RENEW (METHYLCOBALAMIN-BENFOTIAMINE-STABILIZED R-ALPHA LIPOIC ACID)    . lansoprazole (PREVACID) 30 MG capsule TAKE 1 CAPSULE BY MOUTH DAILY BEFORE BREAKFAST 30 capsule 4  . liraglutide (VICTOZA)  18 MG/3ML SOPN Inject 1.2 mg into the skin daily.    Marland Kitchen lisinopril (PRINIVIL,ZESTRIL) 10 MG tablet Take 10 mg by mouth daily.    . magic mouthwash w/lidocaine SOLN Take 10 mLs by mouth 3 (three) times daily. 300 mL 0  . metoprolol succinate (TOPROL-XL) 25 MG 24 hr tablet Take 25 mg by mouth daily.    . Multiple Vitamins-Minerals (CENTRUM SILVER PO) Take 1 tablet by mouth daily.    . ondansetron (ZOFRAN) 8 MG tablet Take 8 mg by mouth every 8 (eight) hours as needed for nausea or refractory nausea / vomiting.     . polyethylene glycol powder (GLYCOLAX/MIRALAX) powder Take 17 g by mouth daily as needed (for constipation.).    Marland Kitchen pravastatin (PRAVACHOL) 40 MG tablet Take 40 mg by mouth daily.    . predniSONE (DELTASONE) 20 MG tablet Take 2 pills once a day for the next 3 days and then once a day.  Do not stop until directed 60 tablet 0  . prochlorperazine (COMPAZINE) 10 MG tablet Take 10 mg by mouth every 8 (eight) hours as needed for vomiting.      No current facility-administered medications for this visit.       Marland Kitchen  PHYSICAL EXAMINATION: ECOG PERFORMANCE STATUS: 0 - Asymptomatic  Vitals:   03/20/18 0846 03/20/18  0900  BP: (!) 159/74 (!) 152/62  Pulse: 63   Resp: 16   Temp: (!) 97.1 F (36.2 C)    Filed Weights   03/20/18 0846  Weight: 198 lb 12.8 oz (90.2 kg)    GENERAL: Well-nourished well-developed; Alert, no distress and comfortable.  He is alone.  EYES: no pallor or icterus OROPHARYNX: no thrush or ulceration; good dentition  NECK: supple, no masses felt LYMPH:  no palpable lymphadenopathy in the cervical, axillary or inguinal regions LUNGS: Decreased breath sounds on the right side compared to the left. No wheeze or crackles HEART/CVS: regular rate & rhythm and no murmurs; No lower extremity edema ABDOMEN: abdomen soft, non-tender and normal bowel sounds Musculoskeletal:no cyanosis of digits and no clubbing  PSYCH: alert & oriented x 3 with fluent speech NEURO: no focal motor/sensory deficits SKIN:  no rashes or significant lesions  LABORATORY DATA:  I have reviewed the data as listed Lab Results  Component Value Date   WBC 6.2 03/20/2018   HGB 11.8 (L) 03/20/2018   HCT 33.3 (L) 03/20/2018   MCV 89.1 03/20/2018   PLT 191 03/20/2018   Recent Labs    01/30/18 0900 02/20/18 0828 02/27/18 0855 03/20/18 0818  NA 139 140 140 140  K 5.2* 4.0 3.6 3.6  CL 105 108 108 107  CO2 29 19* 24 23  GLUCOSE 128* 126* 119* 164*  BUN 33* 36* 31* 35*  CREATININE 1.55* 1.85* 1.72* 1.47*  CALCIUM 9.7 8.9 9.1 9.2  GFRNONAA 43* 34* 38* 46*  GFRAA 50* 40* 44* 53*  PROT 6.8 6.5  --  6.5  ALBUMIN 4.0 3.7  --  3.6  AST 26 17  --  20  ALT 19 15*  --  21  ALKPHOS 77 71  --  61  BILITOT 0.4 0.5  --  0.5    RADIOGRAPHIC STUDIES: I have personally reviewed the radiological images as listed and agreed with the findings in the report. No results found.  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 #Patient currently status post  4 cycles of Keytruda; however given the ongoing  diarrhea-colitis [ from [place] /the recent progression noted on CT scan in March 2019 I would recommend discontinuation of Keytruda.  # Recommend proceeding with gemcitabine starting  in approximately 2 weeks [see discussion below]; labs.  Discussed the potential side effects of skin rash; possible fevers etc. discussed treatments are palliative not curative.   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  Patient to call us if diarrhea worsens.  #Patient inquired regarding clinical trials with CART therapy-we will make a referral to [facility] for possible treatment options-given especially patient's great performance status.   #TSH/baseline -TSH 6 with normal free T4 free T3-monitor for now  # CKD- stage III creat 1.85; improved/ 1.7; baseline 1.5.  Continue increased fluid intake.  # HOLD chemo today; folow up in 2 weeks/labs-gem.   All questions were answered. The patient knows to call the clinic with any problems, questions or concerns.   Cammie Sickle, MD 03/21/2018 7:48 AM

## 2018-03-20 NOTE — Patient Instructions (Signed)
#  Take prednisone 40 mg [2 pills a day] for the next 3 days; starting April 29/ Monday; and then take 1 pill a day; do not stop until further instructions.

## 2018-03-21 ENCOUNTER — Telehealth: Payer: Self-pay | Admitting: Internal Medicine

## 2018-03-21 NOTE — Telephone Encounter (Signed)
I have reached out to  MD Anderson/diagnosis-mesothelioma for this pt thru email. So, MDA might call re: more info.

## 2018-03-23 NOTE — Telephone Encounter (Signed)
Demographics, pathology, and imaging notes have been faxed to [facility] as directed in email.

## 2018-03-27 ENCOUNTER — Telehealth: Payer: Self-pay | Admitting: *Deleted

## 2018-03-27 NOTE — Telephone Encounter (Signed)
Patient called and reports that he has been off Prednisone 2 days and now his diarrhea has come back. He has more prednisone on hand but is asking how much he should take now. Please advise

## 2018-03-27 NOTE — Telephone Encounter (Signed)
Patient informed per VO Dr Rogue Bussing to restart Prednisone at 60 mg per day and keep appointment Wed as planned. He asked for how long he is to take Prednisone 60 mg and I advised until instructed otherwise. He stated "OK"

## 2018-04-01 ENCOUNTER — Inpatient Hospital Stay: Payer: Medicare Other

## 2018-04-01 ENCOUNTER — Inpatient Hospital Stay (HOSPITAL_BASED_OUTPATIENT_CLINIC_OR_DEPARTMENT_OTHER): Payer: Medicare Other | Admitting: Internal Medicine

## 2018-04-01 ENCOUNTER — Inpatient Hospital Stay: Payer: Medicare Other | Attending: Internal Medicine

## 2018-04-01 VITALS — HR 65 | Temp 97.7°F | Resp 16 | Wt 196.2 lb

## 2018-04-01 DIAGNOSIS — J90 Pleural effusion, not elsewhere classified: Secondary | ICD-10-CM | POA: Insufficient documentation

## 2018-04-01 DIAGNOSIS — C459 Mesothelioma, unspecified: Secondary | ICD-10-CM

## 2018-04-01 DIAGNOSIS — E1122 Type 2 diabetes mellitus with diabetic chronic kidney disease: Secondary | ICD-10-CM | POA: Insufficient documentation

## 2018-04-01 DIAGNOSIS — K219 Gastro-esophageal reflux disease without esophagitis: Secondary | ICD-10-CM | POA: Diagnosis not present

## 2018-04-01 DIAGNOSIS — K521 Toxic gastroenteritis and colitis: Secondary | ICD-10-CM | POA: Diagnosis not present

## 2018-04-01 DIAGNOSIS — Z87891 Personal history of nicotine dependence: Secondary | ICD-10-CM

## 2018-04-01 DIAGNOSIS — I679 Cerebrovascular disease, unspecified: Secondary | ICD-10-CM

## 2018-04-01 DIAGNOSIS — T380X5A Adverse effect of glucocorticoids and synthetic analogues, initial encounter: Secondary | ICD-10-CM | POA: Diagnosis not present

## 2018-04-01 DIAGNOSIS — Z8673 Personal history of transient ischemic attack (TIA), and cerebral infarction without residual deficits: Secondary | ICD-10-CM | POA: Diagnosis not present

## 2018-04-01 DIAGNOSIS — E785 Hyperlipidemia, unspecified: Secondary | ICD-10-CM | POA: Diagnosis not present

## 2018-04-01 DIAGNOSIS — Z7982 Long term (current) use of aspirin: Secondary | ICD-10-CM | POA: Insufficient documentation

## 2018-04-01 DIAGNOSIS — N183 Chronic kidney disease, stage 3 (moderate): Secondary | ICD-10-CM

## 2018-04-01 DIAGNOSIS — I129 Hypertensive chronic kidney disease with stage 1 through stage 4 chronic kidney disease, or unspecified chronic kidney disease: Secondary | ICD-10-CM | POA: Insufficient documentation

## 2018-04-01 DIAGNOSIS — Z79899 Other long term (current) drug therapy: Secondary | ICD-10-CM | POA: Insufficient documentation

## 2018-04-01 LAB — COMPREHENSIVE METABOLIC PANEL
ALT: 23 U/L (ref 17–63)
AST: 20 U/L (ref 15–41)
Albumin: 3.7 g/dL (ref 3.5–5.0)
Alkaline Phosphatase: 77 U/L (ref 38–126)
Anion gap: 11 (ref 5–15)
BUN: 31 mg/dL — ABNORMAL HIGH (ref 6–20)
CHLORIDE: 107 mmol/L (ref 101–111)
CO2: 22 mmol/L (ref 22–32)
CREATININE: 1.53 mg/dL — AB (ref 0.61–1.24)
Calcium: 9.1 mg/dL (ref 8.9–10.3)
GFR calc non Af Amer: 43 mL/min — ABNORMAL LOW (ref >60)
GFR, EST AFRICAN AMERICAN: 50 mL/min — AB (ref >60)
Glucose, Bld: 237 mg/dL — ABNORMAL HIGH (ref 65–99)
POTASSIUM: 3.7 mmol/L (ref 3.5–5.1)
Sodium: 140 mmol/L (ref 135–145)
Total Bilirubin: 0.5 mg/dL (ref 0.3–1.2)
Total Protein: 6.7 g/dL (ref 6.5–8.1)

## 2018-04-01 LAB — CBC WITH DIFFERENTIAL/PLATELET
BASOS ABS: 0 10*3/uL (ref 0–0.1)
Basophils Relative: 0 %
EOS ABS: 0 10*3/uL (ref 0–0.7)
Eosinophils Relative: 0 %
HCT: 34.8 % — ABNORMAL LOW (ref 40.0–52.0)
HEMOGLOBIN: 12.2 g/dL — AB (ref 13.0–18.0)
LYMPHS ABS: 1.1 10*3/uL (ref 1.0–3.6)
LYMPHS PCT: 10 %
MCH: 31 pg (ref 26.0–34.0)
MCHC: 35 g/dL (ref 32.0–36.0)
MCV: 88.6 fL (ref 80.0–100.0)
Monocytes Absolute: 0.5 10*3/uL (ref 0.2–1.0)
Monocytes Relative: 5 %
NEUTROS PCT: 85 %
Neutro Abs: 9.5 10*3/uL — ABNORMAL HIGH (ref 1.4–6.5)
Platelets: 238 10*3/uL (ref 150–440)
RBC: 3.93 MIL/uL — AB (ref 4.40–5.90)
RDW: 13.6 % (ref 11.5–14.5)
WBC: 11.1 10*3/uL — AB (ref 3.8–10.6)

## 2018-04-01 NOTE — Assessment & Plan Note (Addendum)
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  Currently status post 4 cycles of Keytruda; discontinued secondary to progression on CT scan March 2019.  Gemcitabine not started yet because of ongoing diarrhea/colitis from [facility].  #Hold starting gemcitabine; given ongoing diarrhea [see discussion below].  Patient has been contacted by MD Ou[facility] Sills for appointment/second opinion; however patient is not too keen on traveling/if his participation in trials or treatment options are limited given his renal dysfunction/CKD 3.  #Diarrhea-grade 1-2-secondary to [facility] currently improved on prednisone at 60 mg a day for the next 2 weeks; if this does not improve discussed use of alternative treatment options like infliximab.   #Mild hypothyroidism TSH 5; normal T4.  Monitor for now.  # CKD- stage III creat stable at baseline creatinine of 1.5.  Continue increased fluid intake.  # HOLD chemo today; folow up in 2 weeks/labs/no treatment

## 2018-04-01 NOTE — Progress Notes (Signed)
[place] OFFICE PROGRESS NOTE  Patient Care Team: Derinda Late, MD as PCP - General (Family Medicine) Margaretha Sheffield, MD (Otolaryngology) Telford Nab, RN as Registered Nurse Nestor Lewandowsky, MD as Referring Physician (Cardiothoracic Surgery) Cammie Sickle, MD as Consulting Physician (Internal Medicine)  Cancer Staging No matching staging information was found for the patient.   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 PN  # MOLECULAR TESTING- PDL-1- 1% [LOW]; [facility]- No actionable**     [name], malignant ([place])   03/19/2018 -  Chemotherapy    The patient had gemcitabine (GEMZAR) 2,128 mg in sodium chloride 0.9 % 250 mL chemo infusion, 1,000 mg/m2, Intravenous,  Once, 0 of 4 cycles  for chemotherapy treatment.          INTERVAL HISTORY:  Ian Shaffer 72 y.o.  male pleasant patient above history of likely mesothelioma -most recently progressed on Keytruda; patient also diagnosed with diarrhea from [place].  Patient was tapered off prednisone as his diarrhea improved.  However, patient had been off prednisone for 2 days; when he noted to have diarrhea again 5-6 loose stools a day.  Patient is  currently on 60 of prednisone for the last 2 days-he had about 2-3 loose stools currently.   Denies any nausea vomiting.  Denies abdominal cramps.   Review of Systems  Constitutional: Negative for chills, diaphoresis, fever, malaise/fatigue and weight loss.  HENT: Negative for nosebleeds and sore throat.   Eyes: Negative for double vision.  Respiratory: Negative for cough, hemoptysis, sputum production, shortness of breath and wheezing.   Cardiovascular: Negative for chest pain, palpitations, orthopnea and leg swelling.  Gastrointestinal: Positive for diarrhea. Negative for abdominal pain, blood in stool, constipation, heartburn, melena, nausea and vomiting.  Genitourinary: Negative for dysuria, frequency and urgency.  Musculoskeletal: Negative for back pain and joint pain.  Skin: Negative.  Negative for itching and rash.  Neurological: Negative for dizziness, tingling, focal weakness, weakness and headaches.  Endo/Heme/Allergies: Does not bruise/bleed easily.  Psychiatric/Behavioral: Negative for depression. The patient is not nervous/anxious and does not have insomnia.       PAST MEDICAL HISTORY :  Past Medical History:  Diagnosis Date  . Benign essential hypertension 05/31/2014  . Cancer ([place]) 06/2017   right lung   . Cerebral infarction ([place]) 05/31/2014  . Cerebrovascular disease 05/31/2014  . CKD (chronic kidney disease) stage 3, GFR 30-59 ml/min (HCC) 02/23/2016  . CVA (cerebral infarction)   . Diabetes mellitus without complication ([place])   . Difficult intubation   . DVT (deep venous thrombosis) ([place])   . Esophageal reflux 05/31/2014  . H/O: CVA (cerebrovascular accident)   . Hyperlipidemia   . Hypertension   . ICAO (internal carotid artery occlusion) March 01, 2014   Left  . Localized, primary osteoarthritis of shoulder region  05/09/2017  . Pleural effusion 04/23/2017  . Stroke [facility]) April, 4,2015  . Type 2 diabetes mellitus with peripheral neuropathy ([place]) 02/23/2016  . Weakness      PAST SURGICAL HISTORY :   Past Surgical History:  Procedure Laterality Date  . CHEST TUBE INSERTION N/A 06/25/2017   Procedure: HALPFX CATHETER INSERTION;  Surgeon: Nestor Lewandowsky, MD;  Location: [facility] ORS;  Service: Thoracic;  Laterality: N/A;  . CHEST TUBE INSERTION Right 08/07/2017   Procedure: REMOVAL OF PLEUR X CATHETER;  Surgeon: Nestor Lewandowsky, MD;  Location: [facility] ORS;  Service: General;  Laterality: Right;  . NASAL SINUS SURGERY  2000  . TONSILLECTOMY    . VIDEO ASSISTED THORACOSCOPY Right 06/25/2017   Procedure: VIDEO ASSISTED THORACOSCOPY WITH TALC PLEURODESIS, CHEST TUBE INSERTION;  Surgeon: Nestor Lewandowsky, MD;  Location: [facility] ORS;  Service: Thoracic;  Laterality: Right;    FAMILY HISTORY :   Family History  Problem Relation Age of Onset  . Hypertension Mother   . Hypertension Father     SOCIAL HISTORY:   Social History   Tobacco Use  . Smoking status: Former Smoker    Types: Pipe    Last attempt to quit: 05/07/1974    Years since quitting: 43.9  . Smokeless tobacco: Never Used  . Tobacco comment: pt states he smoked a pipe a long time ago  Substance Use Topics  . Alcohol use: No  . Drug use: No    ALLERGIES:  is allergic to sulfa antibiotics; adhesive [tape]; amoxicillin; and other.  MEDICATIONS:  Current Outpatient Medications  Medication Sig Dispense Refill  . amLODipine (NORVASC) 5 MG tablet Take 5 mg by mouth daily.    Marland Kitchen aspirin EC 81 MG tablet Take 81 mg by mouth daily.    . clopidogrel (PLAVIX) 75 MG tablet Take 75 mg by mouth daily.    . fluticasone (FLONASE) 50 MCG/ACT nasal spray Place 1 spray into both nostrils daily at 2 PM.     . Homeopathic Products (NERVE PAIN RELIEF SL) Take 1 tablet by mouth daily. NERVE RENEW (METHYLCOBALAMIN-BENFOTIAMINE-STABILIZED R-ALPHA LIPOIC ACID)    . lansoprazole (PREVACID) 30 MG capsule TAKE 1 CAPSULE BY MOUTH DAILY BEFORE BREAKFAST 30 capsule 4  . liraglutide (VICTOZA) 18 MG/3ML SOPN Inject 1.2 mg into the skin daily.     Marland Kitchen lisinopril (PRINIVIL,ZESTRIL) 10 MG tablet Take 10 mg by mouth daily.    . magic mouthwash w/lidocaine SOLN Take 10 mLs by mouth 3 (three) times daily. 300 mL 0  . metoprolol succinate (TOPROL-XL) 25 MG 24 hr tablet Take 25 mg by mouth daily.    . Multiple Vitamins-Minerals (CENTRUM SILVER PO) Take 1 tablet by mouth daily.    . ondansetron (ZOFRAN) 8 MG tablet Take 8 mg by mouth every 8 (eight) hours as needed for nausea or refractory nausea / vomiting.     . polyethylene glycol powder (GLYCOLAX/MIRALAX) powder Take 17 g by mouth daily as needed (for constipation.).    Marland Kitchen pravastatin (PRAVACHOL) 40 MG tablet Take 40 mg by mouth daily.    . predniSONE (DELTASONE) 20 MG tablet Take 2 pills once a day for the next 3 days and then once a day.  Do not stop until directed (Patient taking differently: 60 mg. ) 60 tablet 0  . prochlorperazine (COMPAZINE) 10 MG tablet Take 10 mg by mouth every 8 (eight) hours as needed for vomiting.      No current facility-administered medications for this visit.     PHYSICAL EXAMINATION:  ECOG PERFORMANCE STATUS: 1 - Symptomatic but completely ambulatory  Pulse 65   Temp 97.7 F (36.5 C) (Tympanic)   Resp 16   Wt 196 lb 3.2 oz (89 kg)   BMI 28.15 kg/m   Filed Weights   04/01/18 0927  Weight: 196 lb 3.2 oz (89 kg)    GENERAL: Well-nourished well-developed; Alert, no distress and comfortable.  Accompanied by his wife. EYES: no pallor or icterus OROPHARYNX: no thrush or ulceration; NECK: supple; no lymph nodes felt. LYMPH:  no palpable lymphadenopathy in the axillary or inguinal regions LUNGS: Decreased breath sounds auscultation bilaterally. No wheeze or crackles HEART/CVS: regular rate & rhythm and no murmurs; No lower extremity edema ABDOMEN:abdomen soft, non-tender and normal bowel sounds. No hepatomegaly or splenomegaly.  Musculoskeletal:no cyanosis of digits and no clubbing  PSYCH: alert & oriented x 3 with fluent speech NEURO: no focal  motor/sensory deficits SKIN:  no rashes or significant lesions    LABORATORY DATA:  I have reviewed the data as listed    Component Value Date/Time   NA 140 04/01/2018 0911   K 3.7 04/01/2018 0911   CL 107 04/01/2018 0911   CO2 22 04/01/2018 0911   GLUCOSE 237 (H) 04/01/2018 0911   BUN 31 (H) 04/01/2018 0911   CREATININE 1.53 (H) 04/01/2018 0911   CALCIUM 9.1 04/01/2018 0911   PROT 6.7 04/01/2018 0911   ALBUMIN 3.7 04/01/2018 0911   AST 20 04/01/2018 0911   ALT 23 04/01/2018 0911   ALKPHOS 77 04/01/2018 0911   BILITOT 0.5 04/01/2018 0911   GFRNONAA 43 (L) 04/01/2018 0911   GFRAA 50 (L) 04/01/2018 0911    No results found for: SPEP, UPEP  Lab Results  Component Value Date   WBC 11.1 (H) 04/01/2018   NEUTROABS 9.5 (H) 04/01/2018   HGB 12.2 (L) 04/01/2018   HCT 34.8 (L) 04/01/2018   MCV 88.6 04/01/2018   PLT 238 04/01/2018      Chemistry      Component Value Date/Time   NA 140 04/01/2018 0911   K 3.7 04/01/2018 0911   CL 107 04/01/2018 0911   CO2 22 04/01/2018 0911   BUN 31 (H) 04/01/2018 0911   CREATININE 1.53 (H) 04/01/2018 0911      Component Value Date/Time   CALCIUM 9.1 04/01/2018 0911   ALKPHOS 77 04/01/2018 0911   AST 20 04/01/2018 0911   ALT 23 04/01/2018 0911   BILITOT 0.5 04/01/2018 0911       RADIOGRAPHIC STUDIES: I have personally reviewed the radiological images as listed and agreed with the findings in the report. No results found.   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  Currently status post 4 cycles of Keytruda; discontinued secondary to progression on CT scan March 2019.  Gemcitabine not started yet because of ongoing diarrhea/colitis from [facility].  #Hold starting gemcitabine; given ongoing diarrhea [see discussion below].  Patient has been contacted by [facility] O[facility] S[facility] for appointment/second opinion; however patient is  not too keen on traveling/if his participation in trials or treatment options are limited given his renal dysfunction/CKD 3.  #Diarrhea-grade 1-2-secondary to [facility] currently improved on prednisone at 60 mg a day for the next 2 weeks; if this does not improve discussed use of alternative treatment options like infliximab.   #Mild hypothyroidism TSH 5; normal T4.  Monitor for now.  # CKD- stage III creat stable at baseline  creatinine of 1.5.  Continue increased fluid intake.  # HOLD chemo today; folow up in 2 weeks/labs/no treatment   Orders Placed This Encounter  Procedures  . CBC with Differential    Standing Status:   Future    Standing Expiration Date:   04/02/2019  . Basic metabolic panel    Standing Status:   Future    Standing Expiration Date:   04/02/2019   All questions were answered. The patient knows to call the clinic with any problems, questions or concerns.      Cammie Sickle, MD 04/05/2018 6:52 PM

## 2018-04-09 ENCOUNTER — Telehealth: Payer: Self-pay | Admitting: *Deleted

## 2018-04-09 NOTE — Telephone Encounter (Signed)
Langley Gauss from [facility] called to report that they contacted patient and he refused an appointment

## 2018-04-09 NOTE — Telephone Encounter (Signed)
Dr. Rogue Bussing notified of patient refusing appt with MD Ouida Sills. Will follow up with patient about this at next appt.

## 2018-04-15 ENCOUNTER — Inpatient Hospital Stay (HOSPITAL_BASED_OUTPATIENT_CLINIC_OR_DEPARTMENT_OTHER): Payer: Medicare Other | Admitting: Internal Medicine

## 2018-04-15 ENCOUNTER — Inpatient Hospital Stay: Payer: Medicare Other

## 2018-04-15 VITALS — BP 150/64 | HR 61 | Temp 97.4°F | Resp 16 | Wt 191.2 lb

## 2018-04-15 DIAGNOSIS — K521 Toxic gastroenteritis and colitis: Secondary | ICD-10-CM | POA: Diagnosis not present

## 2018-04-15 DIAGNOSIS — E1122 Type 2 diabetes mellitus with diabetic chronic kidney disease: Secondary | ICD-10-CM | POA: Diagnosis not present

## 2018-04-15 DIAGNOSIS — Z87891 Personal history of nicotine dependence: Secondary | ICD-10-CM | POA: Diagnosis not present

## 2018-04-15 DIAGNOSIS — C459 Mesothelioma, unspecified: Secondary | ICD-10-CM | POA: Diagnosis not present

## 2018-04-15 DIAGNOSIS — Z7982 Long term (current) use of aspirin: Secondary | ICD-10-CM | POA: Diagnosis not present

## 2018-04-15 DIAGNOSIS — K529 Noninfective gastroenteritis and colitis, unspecified: Secondary | ICD-10-CM | POA: Insufficient documentation

## 2018-04-15 DIAGNOSIS — I129 Hypertensive chronic kidney disease with stage 1 through stage 4 chronic kidney disease, or unspecified chronic kidney disease: Secondary | ICD-10-CM

## 2018-04-15 DIAGNOSIS — N183 Chronic kidney disease, stage 3 (moderate): Secondary | ICD-10-CM | POA: Diagnosis not present

## 2018-04-15 LAB — CBC WITH DIFFERENTIAL/PLATELET
BASOS PCT: 0 %
Basophils Absolute: 0 10*3/uL (ref 0–0.1)
EOS ABS: 0 10*3/uL (ref 0–0.7)
EOS PCT: 0 %
HCT: 35.1 % — ABNORMAL LOW (ref 40.0–52.0)
HEMOGLOBIN: 12.3 g/dL — AB (ref 13.0–18.0)
Lymphocytes Relative: 7 %
Lymphs Abs: 0.5 10*3/uL — ABNORMAL LOW (ref 1.0–3.6)
MCH: 31.4 pg (ref 26.0–34.0)
MCHC: 35.1 g/dL (ref 32.0–36.0)
MCV: 89.3 fL (ref 80.0–100.0)
MONOS PCT: 2 %
Monocytes Absolute: 0.2 10*3/uL (ref 0.2–1.0)
NEUTROS PCT: 91 %
Neutro Abs: 7.4 10*3/uL — ABNORMAL HIGH (ref 1.4–6.5)
PLATELETS: 174 10*3/uL (ref 150–440)
RBC: 3.93 MIL/uL — AB (ref 4.40–5.90)
RDW: 13.8 % (ref 11.5–14.5)
WBC: 8.1 10*3/uL (ref 3.8–10.6)

## 2018-04-15 LAB — BASIC METABOLIC PANEL
Anion gap: 9 (ref 5–15)
BUN: 35 mg/dL — AB (ref 6–20)
CALCIUM: 9.3 mg/dL (ref 8.9–10.3)
CO2: 23 mmol/L (ref 22–32)
CREATININE: 1.54 mg/dL — AB (ref 0.61–1.24)
Chloride: 105 mmol/L (ref 101–111)
GFR calc non Af Amer: 43 mL/min — ABNORMAL LOW (ref >60)
GFR, EST AFRICAN AMERICAN: 50 mL/min — AB (ref >60)
Glucose, Bld: 351 mg/dL — ABNORMAL HIGH (ref 65–99)
Potassium: 4.3 mmol/L (ref 3.5–5.1)
Sodium: 137 mmol/L (ref 135–145)

## 2018-04-15 MED ORDER — PREDNISONE 20 MG PO TABS: ORAL_TABLET | ORAL | 0 refills | Status: DC

## 2018-04-15 NOTE — Progress Notes (Signed)
[place] OFFICE PROGRESS NOTE  Patient Care Team: Derinda Late, MD as PCP - General (Family Medicine) Margaretha Sheffield, MD (Otolaryngology) Telford Nab, RN as Registered Nurse Nestor Lewandowsky, MD as Referring Physician (Cardiothoracic Surgery) Cammie Sickle, MD as Consulting Physician (Internal Medicine)  Cancer Staging No matching staging information was found for the patient.   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 PN  # MOLECULAR TESTING- PDL-1- 1% [LOW]; [facility]- No actionable**  -----------------------------------   DIAGNOSIS: [AUG 2019 ] ? [name]   STAGE:  IV  ;GOALS: Palliative  CURRENT/MOST RECENT THERAPY [april2019 ]; last keytruda; Gem on HOLD      [name], malignant ([place])   03/19/2018 -  Chemotherapy    The patient had gemcitabine (GEMZAR) 2,128 mg in sodium chloride 0.9 % 250 mL chemo infusion, 1,000 mg/m2, Intravenous,  Once, 0 of 4 cycles  for chemotherapy treatment.          INTERVAL HISTORY:  Ian Shaffer 72 y.o.  male pleasant patient above history of mesothelioma is here for follow-up.  Patient continues to complain of diarrhea anywhere between 1-3 loose stools  a day.  Complains of bloating/abdominal discomfort.  Otherwise denies any distention.  Patient has been on prednisone 60 mg a day.  Patient blood sugars have been elevated at home.  Complains of fatigue.   Review of Systems  Constitutional: Positive for malaise/fatigue. Negative for chills, diaphoresis, fever and weight loss.  HENT: Negative for nosebleeds and sore throat.   Eyes: Negative for double vision.  Respiratory: Negative for cough, hemoptysis, sputum production, shortness of breath and wheezing.   Cardiovascular: Negative for chest pain, palpitations, orthopnea and leg swelling.  Gastrointestinal: Positive for abdominal pain and diarrhea. Negative for blood in stool, constipation, heartburn, melena, nausea and vomiting.  Genitourinary: Negative for dysuria, frequency and urgency.  Musculoskeletal: Negative for back pain and joint pain.  Skin: Negative.  Negative for itching and rash.  Neurological: Negative for dizziness, tingling, focal weakness, weakness and headaches.  Endo/Heme/Allergies: Does not bruise/bleed easily.  Psychiatric/Behavioral: Negative for depression. The patient is not nervous/anxious and does not have insomnia.       PAST MEDICAL HISTORY :  Past Medical History:  Diagnosis Date  . Benign essential hypertension 05/31/2014  . Cancer ([place]) 06/2017   right lung   . Cerebral infarction ([place]) 05/31/2014  . Cerebrovascular disease 05/31/2014  . CKD (chronic kidney disease) stage 3, GFR 30-59 ml/min ([facility]) 02/23/2016  . CVA (cerebral infarction)   . Diabetes mellitus without complication ([place])   . Difficult intubation   . DVT (deep venous thrombosis) ([place])   . Esophageal reflux 05/31/2014  . H/O: CVA (cerebrovascular accident)   . Hyperlipidemia   . Hypertension   . ICAO (internal carotid artery occlusion) March 01, 2014  Left  . 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 Pleural effusion 04/23/2017  . Stroke [facility]) April, 4,2015  . Type 2  diabetes mellitus with peripheral neuropathy ([place]) 02/23/2016  . Weakness     PAST SURGICAL HISTORY :   Past Surgical History:  Procedure Laterality Date  . CHEST TUBE INSERTION N/A 06/25/2017   Procedure: TDDUKG CATHETER INSERTION;  Surgeon: Nestor Lewandowsky, MD;  Location: [facility] ORS;  Service: Thoracic;  Laterality: N/A;  . CHEST TUBE INSERTION Right 08/07/2017   Procedure: REMOVAL OF PLEUR X CATHETER;  Surgeon: Nestor Lewandowsky, MD;  Location: [facility] ORS;  Service: General;  Laterality: Right;  . NASAL SINUS SURGERY  2000  . TONSILLECTOMY    . VIDEO ASSISTED THORACOSCOPY Right 06/25/2017   Procedure: VIDEO ASSISTED THORACOSCOPY WITH TALC PLEURODESIS, CHEST TUBE INSERTION;  Surgeon: Nestor Lewandowsky, MD;  Location: [facility] ORS;  Service: Thoracic;  Laterality: Right;    FAMILY HISTORY :   Family History  Problem Relation Age of Onset  . Hypertension Mother   . Hypertension Father     SOCIAL HISTORY:   Social History   Tobacco Use  . Smoking status: Former Smoker    Types: Pipe    Last attempt to quit: 05/07/1974    Years since quitting: 43.9  . Smokeless tobacco: Never Used  . Tobacco comment: pt states he smoked a pipe a long time ago  Substance Use Topics  . Alcohol use: No  . Drug use: No    ALLERGIES:  is allergic to sulfa antibiotics; adhesive [tape]; amoxicillin; and other.  MEDICATIONS:  Current Outpatient Medications  Medication Sig Dispense Refill  . amLODipine (NORVASC) 5 MG tablet Take 5 mg by mouth daily.    Marland Kitchen aspirin EC 81 MG tablet Take 81 mg by mouth daily.    . clopidogrel (PLAVIX) 75 MG tablet Take 75 mg by mouth daily.    . fluticasone (FLONASE) 50 MCG/ACT nasal spray Place 1 spray into both nostrils daily at 2 PM.     . Homeopathic Products (NERVE PAIN RELIEF SL) Take 1 tablet by mouth daily. NERVE RENEW (METHYLCOBALAMIN-BENFOTIAMINE-STABILIZED R-ALPHA LIPOIC ACID)    . lansoprazole (PREVACID) 30 MG capsule TAKE 1 CAPSULE BY MOUTH DAILY BEFORE BREAKFAST 30 capsule 4   . liraglutide (VICTOZA) 18 MG/3ML SOPN Inject 1.2 mg into the skin daily.    Marland Kitchen lisinopril (PRINIVIL,ZESTRIL) 10 MG tablet Take 10 mg by mouth daily.    . magic mouthwash w/lidocaine SOLN Take 10 mLs by mouth 3 (three) times daily. 300 mL 0  . metoprolol succinate (TOPROL-XL) 25 MG 24 hr tablet Take 25 mg by mouth daily.    . Multiple Vitamins-Minerals (CENTRUM SILVER PO) Take 1 tablet by mouth daily.    . ondansetron (ZOFRAN) 8 MG tablet Take 8 mg by mouth every 8 (eight) hours as needed for nausea or refractory nausea / vomiting.     . polyethylene glycol powder (GLYCOLAX/MIRALAX) powder Take 17 g by mouth daily as needed (for constipation.).    Marland Kitchen pravastatin (PRAVACHOL) 40 MG tablet Take 40 mg by mouth daily.    . predniSONE (DELTASONE) 20 MG tablet Take 2 pills once a day for the next 3 days and then once a day.  Do not stop until directed (Patient taking differently: 60 mg. ) 60 tablet 0  . prochlorperazine (COMPAZINE) 10 MG tablet Take 10 mg by mouth every 8 (eight) hours as needed for vomiting.      No current facility-administered medications  for this visit.     PHYSICAL EXAMINATION: ECOG PERFORMANCE STATUS: 1 - Symptomatic but completely ambulatory  BP (!) 150/64 (BP Location: Left Arm, Patient Position: Sitting)   Pulse 61   Temp (!) 97.4 F (36.3 C) (Tympanic)   Resp 16   Wt 191 lb 3.2 oz (86.7 kg)   BMI 27.43 kg/m   Filed Weights   04/15/18 1058  Weight: 191 lb 3.2 oz (86.7 kg)    GENERAL: Well-nourished well-developed; Alert, no distress and comfortable.  Accompanied by his wife. EYES: no pallor or icterus OROPHARYNX: no thrush or ulceration; NECK: supple; no lymph nodes felt. LYMPH:  no palpable lymphadenopathy in the axillary or inguinal regions LUNGS: Decreased breath sounds auscultation bilaterally. No wheeze or crackles HEART/CVS: regular rate & rhythm and no murmurs; No lower extremity edema ABDOMEN:abdomen soft, non-tender and normal bowel sounds. No  hepatomegaly or splenomegaly.  Musculoskeletal:no cyanosis of digits and no clubbing  PSYCH: alert & oriented x 3 with fluent speech NEURO: no focal motor/sensory deficits SKIN:  no rashes or significant lesions    LABORATORY DATA:  I have reviewed the data as listed    Component Value Date/Time   NA 137 04/15/2018 1041   K 4.3 04/15/2018 1041   CL 105 04/15/2018 1041   CO2 23 04/15/2018 1041   GLUCOSE 351 (H) 04/15/2018 1041   BUN 35 (H) 04/15/2018 1041   CREATININE 1.54 (H) 04/15/2018 1041   CALCIUM 9.3 04/15/2018 1041   PROT 6.7 04/01/2018 0911   ALBUMIN 3.7 04/01/2018 0911   AST 20 04/01/2018 0911   ALT 23 04/01/2018 0911   ALKPHOS 77 04/01/2018 0911   BILITOT 0.5 04/01/2018 0911   GFRNONAA 43 (L) 04/15/2018 1041   GFRAA 50 (L) 04/15/2018 1041    No results found for: SPEP, UPEP  Lab Results  Component Value Date   WBC 8.1 04/15/2018   NEUTROABS 7.4 (H) 04/15/2018   HGB 12.3 (L) 04/15/2018   HCT 35.1 (L) 04/15/2018   MCV 89.3 04/15/2018   PLT 174 04/15/2018      Chemistry      Component Value Date/Time   NA 137 04/15/2018 1041   K 4.3 04/15/2018 1041   CL 105 04/15/2018 1041   CO2 23 04/15/2018 1041   BUN 35 (H) 04/15/2018 1041   CREATININE 1.54 (H) 04/15/2018 1041      Component Value Date/Time   CALCIUM 9.3 04/15/2018 1041   ALKPHOS 77 04/01/2018 0911   AST 20 04/01/2018 0911   ALT 23 04/01/2018 0911   BILITOT 0.5 04/01/2018 0911       RADIOGRAPHIC STUDIES: I have personally reviewed the radiological images as listed and agreed with the findings in the report. No results found.   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  Stable  #Continue to hold gemcitabine; even ongoing diarrhea [see discussion below].  #Colitis/secondary to [facility] noted to steroids; however not resolved.  However unable to wean off  steroids/and also poorly controlled blood sugars.  Patient is a poor candidate for high-dose steroids/given his poorly controlled diabetes; and need for admission to the hospital.  Recommend prednisone 20 mg a day for now.  Will call new prescription  #  I would recommend use of infliximab at 5 mg/kg dose x1; a second dose may be repeated if not improved.  We will try to get insurance approval ASAP.  Spoke Sonia Baller in pharmacy.  #Mild hypothyroidism TSH 5; normal T4.  Monitor for now.  We will recheck labs in 2 weeks.  # CKD- stage III creat stable at baseline creatinine of 1.4/stable.  #Diabetes poorly controlled /blood sugars- 358/ recommend decreasing the dose of prednisone.   #Infliximab this week; follow-up in 1 week with symptom management/no labs; follow-up with me in 2 weeks/labs; thyroid profile.    Orders Placed This Encounter  Procedures  . Basic metabolic panel    Standing Status:   Future    Standing Expiration Date:   04/16/2019  . CBC with Differential/Platelet    Standing Status:   Future    Standing Expiration Date:   04/16/2019   All questions were answered. The patient knows to call the clinic with any problems, questions or concerns.      Cammie Sickle, MD 04/15/2018 1:33 PM

## 2018-04-15 NOTE — Patient Instructions (Signed)
#  Take prednisone 20 mg once a day/until further directions.

## 2018-04-15 NOTE — Assessment & Plan Note (Addendum)
#  Likely mesothelioma-most recently on Keytruda x4 cycles; discontinued second of progression on a CT scan in March 2019.  Stable  #Continue to hold gemcitabine; even ongoing diarrhea [see discussion below].  #Colitis/secondary to [facility] noted to steroids; however not resolved.  However unable to wean off steroids/and also poorly controlled blood sugars.  Patient is a poor candidate for high-dose steroids/given his poorly controlled diabetes; and will need for admission to the hospital.  Recommend prednisone 20 mg a day for now.  Will call new prescription  #  I would recommend use of infliximab at 5 mg/kg dose x1; a second dose may be repeated if not improved.  We will try to get insurance approval ASAP.  Spoke Sonia Baller in pharmacy.  Discussed the potential side effects of infliximab including but not limited to-r rare infections including reactivation of TB.  Patient not high risk for TB.  #Mild hypothyroidism TSH 5; normal T4.  Stable.  Monitor for now.  We will recheck labs in 2 weeks.  # CKD- stage III creat stable at baseline creatinine of 1.4/stable.  #Diabetes poorly controlled /blood sugars-worsened; 358/ recommend decreasing the dose of prednisone.   #Infliximab this week; follow-up in 1 week with symptom management/no labs; follow-up with me in 2 weeks/labs; thyroid profile.

## 2018-04-17 ENCOUNTER — Inpatient Hospital Stay: Payer: Medicare Other

## 2018-04-17 VITALS — BP 142/82 | HR 56 | Temp 96.9°F | Resp 18

## 2018-04-17 DIAGNOSIS — C459 Mesothelioma, unspecified: Secondary | ICD-10-CM | POA: Diagnosis not present

## 2018-04-17 DIAGNOSIS — K529 Noninfective gastroenteritis and colitis, unspecified: Secondary | ICD-10-CM

## 2018-04-17 MED ORDER — ACETAMINOPHEN 325 MG PO TABS
650.0000 mg | ORAL_TABLET | Freq: Once | ORAL | Status: AC
  Administered 2018-04-17: 650 mg via ORAL
  Filled 2018-04-17: qty 2

## 2018-04-17 MED ORDER — SODIUM CHLORIDE 0.9 % IV SOLN
5.0000 mg/kg | Freq: Once | INTRAVENOUS | Status: AC
  Administered 2018-04-17: 400 mg via INTRAVENOUS
  Filled 2018-04-17: qty 40

## 2018-04-17 MED ORDER — ACETAMINOPHEN 325 MG PO TABS
ORAL_TABLET | ORAL | Status: AC
  Filled 2018-04-17: qty 1

## 2018-04-17 MED ORDER — SODIUM CHLORIDE 0.9 % IV SOLN
Freq: Once | INTRAVENOUS | Status: AC
  Administered 2018-04-17: 09:00:00 via INTRAVENOUS
  Filled 2018-04-17: qty 1000

## 2018-04-17 NOTE — Progress Notes (Signed)
Pt tolerated infusion well. Pt and VS stable at discharge.  

## 2018-04-23 ENCOUNTER — Encounter: Payer: Self-pay | Admitting: Nurse Practitioner

## 2018-04-23 ENCOUNTER — Inpatient Hospital Stay (HOSPITAL_BASED_OUTPATIENT_CLINIC_OR_DEPARTMENT_OTHER): Payer: Medicare Other | Admitting: Nurse Practitioner

## 2018-04-23 VITALS — BP 158/71 | HR 57 | Temp 98.1°F | Resp 18 | Ht 70.0 in | Wt 190.7 lb

## 2018-04-23 DIAGNOSIS — E1122 Type 2 diabetes mellitus with diabetic chronic kidney disease: Secondary | ICD-10-CM

## 2018-04-23 DIAGNOSIS — I129 Hypertensive chronic kidney disease with stage 1 through stage 4 chronic kidney disease, or unspecified chronic kidney disease: Secondary | ICD-10-CM | POA: Diagnosis not present

## 2018-04-23 DIAGNOSIS — K521 Toxic gastroenteritis and colitis: Secondary | ICD-10-CM

## 2018-04-23 DIAGNOSIS — N183 Chronic kidney disease, stage 3 (moderate): Secondary | ICD-10-CM | POA: Diagnosis not present

## 2018-04-23 DIAGNOSIS — Z79899 Other long term (current) drug therapy: Secondary | ICD-10-CM

## 2018-04-23 DIAGNOSIS — C459 Mesothelioma, unspecified: Secondary | ICD-10-CM

## 2018-04-23 DIAGNOSIS — Z87891 Personal history of nicotine dependence: Secondary | ICD-10-CM

## 2018-04-23 DIAGNOSIS — R739 Hyperglycemia, unspecified: Secondary | ICD-10-CM

## 2018-04-23 DIAGNOSIS — Z9289 Personal history of other medical treatment: Secondary | ICD-10-CM

## 2018-04-23 DIAGNOSIS — T451X5A Adverse effect of antineoplastic and immunosuppressive drugs, initial encounter: Secondary | ICD-10-CM

## 2018-04-23 NOTE — Progress Notes (Signed)
Symptom Management [place]  [phone] Fax:([phone]  Patient Care Team: Derinda Late, MD as PCP - General (Family Medicine) Margaretha Sheffield, MD (Otolaryngology) Telford Nab, RN as Registered Nurse Nestor Lewandowsky, MD as Referring Physician (Cardiothoracic Surgery) Cammie Sickle, MD as Consulting Physician (Internal Medicine)   Name of the patient: Ian Shaffer  [id number]  04/13/1946   Date of visit: 04/23/18  [place]  Chief complaint/ Reason for visit- Diarrhea and Elevated Blood Sugars  Heme/Onc history:  Oncology History   # July-AUG 2018- right pleural based mass ~10 cm [ above & below diaphragm]; s/p VATS- Dr.Oaks-Bx- 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 PN  # MOLECULAR TESTING- PDL-1- 1% [LOW]; [facility]- No actionable**  -----------------------------------   DIAGNOSIS: [AUG 2019 ] ? [name]   STAGE:  IV  ;GOALS: Palliative  CURRENT/MOST RECENT THERAPY [april2019 ]; last keytruda; Gem on HOLD      [name], malignant ([place])   03/19/2018 -  Chemotherapy    The patient had gemcitabine (GEMZAR) 2,128 mg in sodium chloride 0.9 % 250 mL chemo infusion, 1,000 mg/m2, Intravenous,  Once, 0 of 4 cycles  for chemotherapy treatment.      Patient has a history  of malignant mesothelioma.  Last evaluated by primary oncologist, Dr. Rogue Bussing, on 04/15/2018.  Patient had significant diarrhea related to [facility].  Was given steroids and suffered significantly elevated blood sugars.    Interval history- patient presents to symptom management clinic today for evaluation for diarrhea and elevated blood sugars. Previously, he received gemcitabine and Keytruda but suffered severe diarrhea which was treated with steroids. He suffered significantly elevated blood sugars while on steroids and has been weaning down. He received one dose of Infliximab (57m/kg) on 04/17/18. Tolerated well. 2 days later he noticed improvement in stool frequency from 3-6 stools per day to 1-3 stools per day. Diarrhea continues to be watery. He continues prednisone 20 mg/day. Blood sugars have been improved, now in 190s. Today, he has had one watery stool this morning.   ECOG FS:0 - Asymptomatic  Review of systems- Review of Systems  Constitutional: Negative for chills, fever, malaise/fatigue and weight loss.  HENT: Negative for congestion, ear discharge, ear pain, sinus pain, sore throat and tinnitus.   Eyes: Negative.   Respiratory: Negative.  Negative for cough, sputum production and shortness of breath.   Cardiovascular: Negative for chest pain, palpitations, orthopnea, claudication and leg swelling.  Gastrointestinal: Positive for diarrhea. Negative for abdominal pain, blood in stool, constipation, heartburn, nausea and vomiting.  Genitourinary: Negative.   Musculoskeletal: Negative.   Skin: Negative.   Neurological: Negative for dizziness, tingling, weakness and headaches.  Endo/Heme/Allergies: Negative.        Sugars in 190s  Psychiatric/Behavioral: Negative.      Current treatment- Gemcitabine & Keytruda (currently held d/t symptoms)  Allergies  Allergen Reactions  . Sulfa Antibiotics Swelling    Facial swelling No  tongue or lips swelling, no difficulty breathing.  .  Adhesive [Tape] Itching and Rash  . Amoxicillin Nausea Only    Has patient had a PCN reaction causing immediate rash, facial/tongue/throat swelling, SOB or lightheadedness with hypotension: No Has patient had a PCN reaction causing severe rash involving mucus membranes or skin necrosis: No Has patient had a PCN reaction that required hospitalization: No Has patient had a PCN reaction occurring within the last 10 years: Yes If all of the above answers are "NO", then may proceed with Cephalosporin use.   . Other Rash    Surgical tape     Past Medical History:  Diagnosis Date  . Benign essential hypertension 05/31/2014  . Cancer ([place]) 06/2017   right lung   . Cerebral infarction ([place]) 05/31/2014  . Cerebrovascular disease 05/31/2014  . CKD (chronic kidney disease) stage 3, GFR 30-59 ml/min (HCC) 02/23/2016  . CVA (cerebral infarction)   . Diabetes mellitus without complication ([place])   . Difficult intubation   . DVT (deep venous thrombosis) ([place])   . Esophageal reflux 05/31/2014  . H/O: CVA (cerebrovascular accident)   . Hyperlipidemia   . Hypertension   . ICAO (internal carotid artery occlusion) March 01, 2014   Left  . 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 Pleural effusion 04/23/2017  . Stroke [facility]) April, 4,2015  . Type 2 diabetes mellitus with peripheral neuropathy ([place]) 02/23/2016  . Weakness      Past Surgical History:  Procedure Laterality Date  . CHEST TUBE INSERTION N/A 06/25/2017   Procedure: ZOXWRU CATHETER INSERTION;  Surgeon: Nestor Lewandowsky, MD;  Location: [facility] ORS;  Service: Thoracic;  Laterality: N/A;  . CHEST TUBE INSERTION Right 08/07/2017   Procedure: REMOVAL OF PLEUR X CATHETER;  Surgeon: Nestor Lewandowsky, MD;  Location: [facility] ORS;  Service: General;  Laterality: Right;  . NASAL SINUS SURGERY  2000  . TONSILLECTOMY    . VIDEO ASSISTED THORACOSCOPY Right 06/25/2017   Procedure: VIDEO ASSISTED THORACOSCOPY WITH TALC PLEURODESIS, CHEST TUBE INSERTION;   Surgeon: Nestor Lewandowsky, MD;  Location: [facility] ORS;  Service: Thoracic;  Laterality: Right;    Social History   Socioeconomic History  . Marital status: Married    Spouse name: Not on file  . Number of children: Not on file  . Years of education: Not on file  . Highest education level: Not on file  Occupational History  . Not on file  Social Needs  . Financial resource strain: Not on file  . Food insecurity:    Worry: Not on file    Inability: Not on file  . Transportation needs:    Medical: Not on file    Non-medical: Not on file  Tobacco Use  . Smoking status: Former Smoker    Types: Pipe    Last attempt to quit: 05/07/1974    Years since quitting: 43.9  . Smokeless tobacco: Never Used  . Tobacco comment: pt states he smoked a pipe a long time ago  Substance and Sexual Activity  . Alcohol use: No  . Drug use: No  . Sexual activity: Not on file  Lifestyle  . Physical activity:    Days per week: Not on file    Minutes per session: Not on file  . Stress: Not on file  Relationships  . Social connections:    Talks on phone: Not on file    Gets together: Not on file    Attends religious service: Not on file  Active member of club or organization: Not on file    Attends meetings of clubs or organizations: Not on file    Relationship status: Not on file  . Intimate partner violence:    Fear of current or ex partner: Not on file    Emotionally abused: Not on file    Physically abused: Not on file    Forced sexual activity: Not on file  Other Topics Concern  . Not on file  Social History Narrative  . Not on file    Family History  Problem Relation Age of Onset  . Hypertension Mother   . Hypertension Father      Current Outpatient Medications:  .  amLODipine (NORVASC) 5 MG tablet, Take 5 mg by mouth daily., Disp: , Rfl:  .  aspirin EC 81 MG tablet, Take 81 mg by mouth daily., Disp: , Rfl:  .  clopidogrel (PLAVIX) 75 MG tablet, Take 75 mg by mouth daily., Disp:  , Rfl:  .  fluticasone (FLONASE) 50 MCG/ACT nasal spray, Place 1 spray into both nostrils daily at 2 PM. , Disp: , Rfl:  .  Homeopathic Products (NERVE PAIN RELIEF SL), Take 1 tablet by mouth daily. NERVE RENEW (METHYLCOBALAMIN-BENFOTIAMINE-STABILIZED R-ALPHA LIPOIC ACID), Disp: , Rfl:  .  lansoprazole (PREVACID) 30 MG capsule, TAKE 1 CAPSULE BY MOUTH DAILY BEFORE BREAKFAST, Disp: 30 capsule, Rfl: 4 .  liraglutide (VICTOZA) 18 MG/3ML SOPN, Inject 1.2 mg into the skin daily., Disp: , Rfl:  .  metoprolol succinate (TOPROL-XL) 25 MG 24 hr tablet, Take 25 mg by mouth daily., Disp: , Rfl:  .  Multiple Vitamins-Minerals (CENTRUM SILVER PO), Take 1 tablet by mouth daily., Disp: , Rfl:  .  pravastatin (PRAVACHOL) 40 MG tablet, Take 40 mg by mouth daily., Disp: , Rfl:  .  predniSONE (DELTASONE) 20 MG tablet, Take 1 pill a day; do not stop until further directed., Disp: 40 tablet, Rfl: 0 .  lisinopril (PRINIVIL,ZESTRIL) 10 MG tablet, Take 10 mg by mouth daily., Disp: , Rfl:  .  magic mouthwash w/lidocaine SOLN, Take 10 mLs by mouth 3 (three) times daily., Disp: 300 mL, Rfl: 0 .  ondansetron (ZOFRAN) 8 MG tablet, Take 8 mg by mouth every 8 (eight) hours as needed for nausea or refractory nausea / vomiting. , Disp: , Rfl:  .  polyethylene glycol powder (GLYCOLAX/MIRALAX) powder, Take 17 g by mouth daily as needed (for constipation.)., Disp: , Rfl:  .  prochlorperazine (COMPAZINE) 10 MG tablet, Take 10 mg by mouth every 8 (eight) hours as needed for vomiting. , Disp: , Rfl:   Physical exam:  Vitals:   04/23/18 0949  BP: (!) 158/71  Pulse: (!) 57  Resp: 18  Temp: 98.1 F (36.7 C)  TempSrc: Oral  Weight: 190 lb 11.2 oz (86.5 kg)  Height: 5' 10" (1.778 m)   Physical Exam  Constitutional: He is oriented to person, place, and time. He appears well-developed and well-nourished. No distress.  Unaccompanied  HENT:  Head: Normocephalic and atraumatic.  Nose: Nose normal.  Mouth/Throat: Oropharynx is clear  and moist. No oropharyngeal exudate.  Eyes: Conjunctivae are normal. No scleral icterus.  Neck: Normal range of motion. Neck supple.  Cardiovascular: Normal rate, regular rhythm and normal heart sounds.  Pulmonary/Chest: Effort normal and breath sounds normal. He has no wheezes.  Abdominal: Soft. Bowel sounds are normal. He exhibits no distension. There is no tenderness.  Musculoskeletal: Normal range of motion. He exhibits no edema.  Neurological: He is  alert and oriented to person, place, and time.  Skin: Skin is warm and dry.  Psychiatric: He has a normal mood and affect. His behavior is normal.     CMP Latest Ref Rng & Units 04/15/2018  Glucose 65 - 99 mg/dL 351(H)  BUN 6 - 20 mg/dL 35(H)  Creatinine 0.61 - 1.24 mg/dL 1.54(H)  Sodium 135 - 145 mmol/L 137  Potassium 3.5 - 5.1 mmol/L 4.3  Chloride 101 - 111 mmol/L 105  CO2 22 - 32 mmol/L 23  Calcium 8.9 - 10.3 mg/dL 9.3  Total Protein 6.5 - 8.1 g/dL -  Total Bilirubin 0.3 - 1.2 mg/dL -  Alkaline Phos 38 - 126 U/L -  AST 15 - 41 U/L -  ALT 17 - 63 U/L -   CBC Latest Ref Rng & Units 04/15/2018  WBC 3.8 - 10.6 K/uL 8.1  Hemoglobin 13.0 - 18.0 g/dL 12.3(L)  Hematocrit 40.0 - 52.0 % 35.1(L)  Platelets 150 - 440 K/uL 174    No images are attached to the encounter.  No results found.   Assessment and plan- Patient is a 72 y.o. male with malignant mesothelioma who presents to symptom management for evaluation of diarrhea and blood sugars.  1.  Malignant mesothelioma-most recently on Keytruda x 4 cycles; discontinued secondary to progression based on 3/19 CT. Currently planning to start gemcitabine once diarrhea resolves.   2. Immunotherapy-induced diarrhea- d/t Beryle Flock. Previously received high doses of steroids and suffered hyperglycemia (see below). Now on prednisone 2m/day and s/p 1 dose of Infliximab. Tolerated well. Diarrhea improved to 1-3 watery stools per day. Discussed with Dr. BRogue Bussingwho recommends 2nd dose of  Infliximab later this week  3.  Elevated blood sugars-due to steroids.  Currently on prednisone 20 mg/day.  Blood sugars improving to 190s, previously > 300s.  Goal to continue to wean off steroids.  Continue 20 mg/day at this time.  Recommended continued close monitoring of blood sugars at home and to alert clinic persistently elevated sugars.  RTC on 04/24/18 for Infliximab then again on 04/29/18 for labs and consideration of gemcitabine with Dr. BRogue Bussing    Visit Diagnosis 1. [name]   2. History of immunotherapy   3. Chemotherapy induced diarrhea   4. Hyperglycemia     Patient expressed understanding and was in agreement with this plan. He also understands that He can call clinic at any time with any questions, concerns, or complaints.    LBeckey Rutter DNP, AGNP-C CMorriceat [phone](work cell) [phone](office)

## 2018-04-23 NOTE — Progress Notes (Signed)
Pt here to f/u with diarrhea and given prednisone made sugar go up. He is now feeling better. Drinking and eating good. He drinks-gatorade zero, lemonade light, and water. 1-2 diarrhea stools a day but much better than before he started prednisone. No complaints taking new med for cancer-infliximab.

## 2018-04-24 ENCOUNTER — Inpatient Hospital Stay: Payer: Medicare Other

## 2018-04-27 ENCOUNTER — Inpatient Hospital Stay: Payer: Medicare Other | Attending: Internal Medicine

## 2018-04-27 VITALS — BP 133/71 | HR 61 | Temp 97.8°F | Resp 18

## 2018-04-27 DIAGNOSIS — E119 Type 2 diabetes mellitus without complications: Secondary | ICD-10-CM | POA: Insufficient documentation

## 2018-04-27 DIAGNOSIS — N183 Chronic kidney disease, stage 3 (moderate): Secondary | ICD-10-CM | POA: Diagnosis not present

## 2018-04-27 DIAGNOSIS — K219 Gastro-esophageal reflux disease without esophagitis: Secondary | ICD-10-CM | POA: Insufficient documentation

## 2018-04-27 DIAGNOSIS — I129 Hypertensive chronic kidney disease with stage 1 through stage 4 chronic kidney disease, or unspecified chronic kidney disease: Secondary | ICD-10-CM | POA: Insufficient documentation

## 2018-04-27 DIAGNOSIS — Z79899 Other long term (current) drug therapy: Secondary | ICD-10-CM | POA: Diagnosis not present

## 2018-04-27 DIAGNOSIS — K529 Noninfective gastroenteritis and colitis, unspecified: Secondary | ICD-10-CM | POA: Insufficient documentation

## 2018-04-27 DIAGNOSIS — Z8673 Personal history of transient ischemic attack (TIA), and cerebral infarction without residual deficits: Secondary | ICD-10-CM | POA: Diagnosis not present

## 2018-04-27 DIAGNOSIS — E785 Hyperlipidemia, unspecified: Secondary | ICD-10-CM | POA: Insufficient documentation

## 2018-04-27 DIAGNOSIS — R21 Rash and other nonspecific skin eruption: Secondary | ICD-10-CM | POA: Insufficient documentation

## 2018-04-27 DIAGNOSIS — Z5111 Encounter for antineoplastic chemotherapy: Secondary | ICD-10-CM | POA: Diagnosis not present

## 2018-04-27 DIAGNOSIS — C459 Mesothelioma, unspecified: Secondary | ICD-10-CM | POA: Diagnosis not present

## 2018-04-27 DIAGNOSIS — R509 Fever, unspecified: Secondary | ICD-10-CM | POA: Diagnosis not present

## 2018-04-27 MED ORDER — SODIUM CHLORIDE 0.9 % IV SOLN
Freq: Once | INTRAVENOUS | Status: AC
  Administered 2018-04-27: 09:00:00 via INTRAVENOUS
  Filled 2018-04-27: qty 1000

## 2018-04-27 MED ORDER — ACETAMINOPHEN 325 MG PO TABS
650.0000 mg | ORAL_TABLET | Freq: Once | ORAL | Status: AC
  Administered 2018-04-27: 650 mg via ORAL
  Filled 2018-04-27: qty 2

## 2018-04-27 MED ORDER — SODIUM CHLORIDE 0.9 % IV SOLN
5.0000 mg/kg | Freq: Once | INTRAVENOUS | Status: AC
  Administered 2018-04-27: 400 mg via INTRAVENOUS
  Filled 2018-04-27: qty 40

## 2018-04-28 ENCOUNTER — Other Ambulatory Visit: Payer: Self-pay | Admitting: Internal Medicine

## 2018-04-28 DIAGNOSIS — K521 Toxic gastroenteritis and colitis: Secondary | ICD-10-CM

## 2018-04-28 NOTE — Telephone Encounter (Signed)
Per md - patient has an apt tomorrow in the clinic. Will hold off on renewing this script until patient is evaluated.

## 2018-04-29 ENCOUNTER — Other Ambulatory Visit: Payer: Self-pay

## 2018-04-29 ENCOUNTER — Inpatient Hospital Stay (HOSPITAL_BASED_OUTPATIENT_CLINIC_OR_DEPARTMENT_OTHER): Payer: Medicare Other | Admitting: Internal Medicine

## 2018-04-29 ENCOUNTER — Encounter: Payer: Self-pay | Admitting: Internal Medicine

## 2018-04-29 ENCOUNTER — Inpatient Hospital Stay: Payer: Medicare Other

## 2018-04-29 DIAGNOSIS — E119 Type 2 diabetes mellitus without complications: Secondary | ICD-10-CM | POA: Diagnosis not present

## 2018-04-29 DIAGNOSIS — Z5111 Encounter for antineoplastic chemotherapy: Secondary | ICD-10-CM | POA: Diagnosis not present

## 2018-04-29 DIAGNOSIS — C459 Mesothelioma, unspecified: Secondary | ICD-10-CM | POA: Diagnosis not present

## 2018-04-29 DIAGNOSIS — N183 Chronic kidney disease, stage 3 (moderate): Secondary | ICD-10-CM | POA: Diagnosis not present

## 2018-04-29 DIAGNOSIS — I129 Hypertensive chronic kidney disease with stage 1 through stage 4 chronic kidney disease, or unspecified chronic kidney disease: Secondary | ICD-10-CM

## 2018-04-29 DIAGNOSIS — Z79899 Other long term (current) drug therapy: Secondary | ICD-10-CM | POA: Diagnosis not present

## 2018-04-29 DIAGNOSIS — K529 Noninfective gastroenteritis and colitis, unspecified: Secondary | ICD-10-CM

## 2018-04-29 LAB — CBC WITH DIFFERENTIAL/PLATELET
BASOS PCT: 0 %
Basophils Absolute: 0 10*3/uL (ref 0–0.1)
Eosinophils Absolute: 0 10*3/uL (ref 0–0.7)
Eosinophils Relative: 0 %
HEMATOCRIT: 35.5 % — AB (ref 40.0–52.0)
HEMOGLOBIN: 12.1 g/dL — AB (ref 13.0–18.0)
LYMPHS ABS: 0.4 10*3/uL — AB (ref 1.0–3.6)
Lymphocytes Relative: 8 %
MCH: 31 pg (ref 26.0–34.0)
MCHC: 34.2 g/dL (ref 32.0–36.0)
MCV: 90.6 fL (ref 80.0–100.0)
MONOS PCT: 3 %
Monocytes Absolute: 0.2 10*3/uL (ref 0.2–1.0)
NEUTROS ABS: 5.1 10*3/uL (ref 1.4–6.5)
Neutrophils Relative %: 89 %
Platelets: 193 10*3/uL (ref 150–440)
RBC: 3.92 MIL/uL — ABNORMAL LOW (ref 4.40–5.90)
RDW: 14.2 % (ref 11.5–14.5)
WBC: 5.7 10*3/uL (ref 3.8–10.6)

## 2018-04-29 LAB — BASIC METABOLIC PANEL
Anion gap: 10 (ref 5–15)
BUN: 26 mg/dL — ABNORMAL HIGH (ref 6–20)
CALCIUM: 8.8 mg/dL — AB (ref 8.9–10.3)
CHLORIDE: 106 mmol/L (ref 101–111)
CO2: 21 mmol/L — AB (ref 22–32)
CREATININE: 1.5 mg/dL — AB (ref 0.61–1.24)
GFR calc Af Amer: 52 mL/min — ABNORMAL LOW (ref >60)
GFR calc non Af Amer: 44 mL/min — ABNORMAL LOW (ref >60)
GLUCOSE: 560 mg/dL — AB (ref 65–99)
Potassium: 4.5 mmol/L (ref 3.5–5.1)
Sodium: 137 mmol/L (ref 135–145)

## 2018-04-29 NOTE — Progress Notes (Signed)
Patient here for follow up patient states no changes since his last appt.

## 2018-04-29 NOTE — Assessment & Plan Note (Addendum)
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 [See discussion below]  #Plan to proceed with gemcitabine in approximately 1 week [with resolution of diarrhea-see discussion below]; however get a CT scan prior for baseline/approximately 1 week.  #Colitis/secondary to [facility]; status post infliximab infusions x2; most recent June 3rd.  Recommend stopping prednisone [blood sugars 560 today]  # CKD- stage III creat stable at baseline creatinine of 1.4/stable.  #Diabetes poorly controlled blood sugars 500+-second steroids; stopped prednisone.  Recommend checking blood sugars closely.   #Reviewed the gemcitabine side effects; stable again in detail.  #Recommend follow-up in 1 week; gemcitabine/labs CT scan prior.

## 2018-04-29 NOTE — Progress Notes (Signed)
[name] OFFICE PROGRESS NOTE  Patient Care Team: Derinda Late, MD as PCP - General (Family Medicine) Margaretha Sheffield, MD (Otolaryngology) Telford Nab, RN as Registered Nurse Nestor Lewandowsky, MD as Referring Physician (Cardiothoracic Surgery) Cammie Sickle, MD as Consulting Physician (Internal Medicine)  Cancer Staging No matching staging information was found for the patient.   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 PN  # MOLECULAR TESTING- PDL-1- 1% [LOW]; [facility]- No actionable**  -----------------------------------   DIAGNOSIS: [AUG 2019 ] ? [name]   STAGE:  IV  ;GOALS: Palliative  CURRENT/MOST RECENT THERAPY [april2019 ]; last keytruda; Gem on HOLD      [name], malignant ([place])   03/19/2018 -  Chemotherapy    The patient had gemcitabine (GEMZAR) 2,128 mg in sodium chloride 0.9 % 250 mL chemo infusion, 1,000 mg/m2, Intravenous,  Once, 0 of 4 cycles  for chemotherapy treatment.          INTERVAL HISTORY:  Ian Shaffer 72 y.o.  male pleasant patient above history of metastatic/unresectable mesothelioma is here for follow-up.  Ian Shaffer has been continued because of disease  progression; also diarrhea/colitis.  Patient received infliximab x2; most recent on June 3rd.  Patient is currently having approximately 1 loose stool a day. He continues to have prednisone 20 mg a day.  He complains of mild to moderate fatigue.  Denies any difficulty swallowing pain with swallowing or leg swelling.  Review of Systems  Constitutional: Positive for malaise/fatigue. Negative for chills, diaphoresis, fever and weight loss.  HENT: Negative for nosebleeds and sore throat.   Eyes: Negative for double vision.  Respiratory: Negative for cough, hemoptysis, sputum production, shortness of breath and wheezing.   Cardiovascular: Negative for chest pain, palpitations, orthopnea and leg swelling.  Gastrointestinal: Positive for diarrhea (Improving). Negative for abdominal pain, blood in stool, constipation, heartburn, melena, nausea and vomiting.  Genitourinary: Negative for dysuria, frequency and urgency.  Musculoskeletal: Negative for back pain and joint pain.  Skin: Negative.  Negative for itching and rash.  Neurological: Negative for dizziness, tingling, focal weakness, weakness and headaches.  Endo/Heme/Allergies: Does not bruise/bleed easily.  Psychiatric/Behavioral: Negative for depression. The patient is not nervous/anxious and does not have insomnia.       PAST MEDICAL HISTORY :  Past Medical History:  Diagnosis Date  . Benign essential hypertension 05/31/2014  . Cancer ([place]) 06/2017   right lung   . Cerebral infarction ([place]) 05/31/2014  . Cerebrovascular disease 05/31/2014  . CKD (chronic kidney disease) stage 3, GFR 30-59 ml/min ([facility]) 02/23/2016  . CVA (cerebral infarction)   . Diabetes mellitus without complication ([place])   . Difficult intubation   . DVT (deep venous thrombosis) ([place])   . Esophageal reflux 05/31/2014  . H/O: CVA (cerebrovascular accident)   . Hyperlipidemia   .  Hypertension   . ICAO (internal carotid artery occlusion) March 01, 2014   Left  . Localized,  primary osteoarthritis of shoulder region 05/09/2017  . Pleural effusion 04/23/2017  . Stroke [facility]) April, 4,2015  . Type 2 diabetes mellitus with peripheral neuropathy ([place]) 02/23/2016  . Weakness     PAST SURGICAL HISTORY :   Past Surgical History:  Procedure Laterality Date  . CHEST TUBE INSERTION N/A 06/25/2017   Procedure: OZDGUY CATHETER INSERTION;  Surgeon: Nestor Lewandowsky, MD;  Location: [facility] ORS;  Service: Thoracic;  Laterality: N/A;  . CHEST TUBE INSERTION Right 08/07/2017   Procedure: REMOVAL OF PLEUR X CATHETER;  Surgeon: Nestor Lewandowsky, MD;  Location: [facility] ORS;  Service: General;  Laterality: Right;  . NASAL SINUS SURGERY  2000  . TONSILLECTOMY    . VIDEO ASSISTED THORACOSCOPY Right 06/25/2017   Procedure: VIDEO ASSISTED THORACOSCOPY WITH TALC PLEURODESIS, CHEST TUBE INSERTION;  Surgeon: Nestor Lewandowsky, MD;  Location: [facility] ORS;  Service: Thoracic;  Laterality: Right;    FAMILY HISTORY :   Family History  Problem Relation Age of Onset  . Hypertension Mother   . Hypertension Father     SOCIAL HISTORY:   Social History   Tobacco Use  . Smoking status: Former Smoker    Types: Pipe    Last attempt to quit: 05/07/1974    Years since quitting: 44.0  . Smokeless tobacco: Never Used  . Tobacco comment: pt states he smoked a pipe a long time ago  Substance Use Topics  . Alcohol use: No  . Drug use: No    ALLERGIES:  is allergic to sulfa antibiotics; adhesive [tape]; amoxicillin; and other.  MEDICATIONS:  Current Outpatient Medications  Medication Sig Dispense Refill  . amLODipine (NORVASC) 5 MG tablet Take 5 mg by mouth daily.    Marland Kitchen aspirin EC 81 MG tablet Take 81 mg by mouth daily.    . clopidogrel (PLAVIX) 75 MG tablet Take 75 mg by mouth daily.    . fluticasone (FLONASE) 50 MCG/ACT nasal spray Place 1 spray into both nostrils daily at 2 PM.     . Homeopathic Products (NERVE PAIN RELIEF SL) Take 1 tablet by mouth daily. NERVE RENEW (METHYLCOBALAMIN-BENFOTIAMINE-STABILIZED  R-ALPHA LIPOIC ACID)    . lansoprazole (PREVACID) 30 MG capsule TAKE 1 CAPSULE BY MOUTH DAILY BEFORE BREAKFAST 30 capsule 4  . liraglutide (VICTOZA) 18 MG/3ML SOPN Inject 1.2 mg into the skin daily.    Marland Kitchen lisinopril (PRINIVIL,ZESTRIL) 10 MG tablet Take 10 mg by mouth daily.    . magic mouthwash w/lidocaine SOLN Take 10 mLs by mouth 3 (three) times daily. 300 mL 0  . metoprolol succinate (TOPROL-XL) 25 MG 24 hr tablet Take 25 mg by mouth daily.    . Multiple Vitamins-Minerals (CENTRUM SILVER PO) Take 1 tablet by mouth daily.    . ondansetron (ZOFRAN) 8 MG tablet Take 8 mg by mouth every 8 (eight) hours as needed for nausea or refractory nausea / vomiting.     . polyethylene glycol powder (GLYCOLAX/MIRALAX) powder Take 17 g by mouth daily as needed (for constipation.).    Marland Kitchen pravastatin (PRAVACHOL) 40 MG tablet Take 40 mg by mouth daily.    . predniSONE (DELTASONE) 20 MG tablet Take 1 pill a day; do not stop until further directed. 40 tablet 0  . prochlorperazine (COMPAZINE) 10 MG tablet Take 10 mg by mouth every 8 (eight) hours as needed for vomiting.      No current facility-administered medications for this visit.  PHYSICAL EXAMINATION: ECOG PERFORMANCE STATUS: 1 - Symptomatic but completely ambulatory  BP (!) 167/71 (BP Location: Left Arm, Patient Position: Sitting)   Pulse 66   Temp (!) 97.4 F (36.3 C) (Tympanic)   Resp 12   Ht _0  (1.778 m)   Wt 190 lb (86.2 kg)   BMI 27.26 kg/m   Filed Weights   04/29/18 1437  Weight: 190 lb (86.2 kg)    GENERAL: Well-nourished well-developed; Alert, no distress and comfortable.  Accompanied by his wife. EYES: no pallor or icterus OROPHARYNX: no thrush or ulceration; NECK: supple; no lymph nodes felt. LYMPH:  no palpable lymphadenopathy in the axillary or inguinal regions LUNGS: Decreased breath sounds auscultation bilaterally. No wheeze or crackles HEART/CVS: regular rate & rhythm and no murmurs; No lower extremity  edema ABDOMEN:abdomen soft, non-tender and normal bowel sounds. No hepatomegaly or splenomegaly.  Musculoskeletal:no cyanosis of digits and no clubbing  PSYCH: alert & oriented x 3 with fluent speech NEURO: no focal motor/sensory deficits SKIN:  no rashes or significant lesions    LABORATORY DATA:  I have reviewed the data as listed    Component Value Date/Time   NA 137 04/29/2018 1425   K 4.5 04/29/2018 1425   CL 106 04/29/2018 1425   CO2 21 (L) 04/29/2018 1425   GLUCOSE 560 (HH) 04/29/2018 1425   BUN 26 (H) 04/29/2018 1425   CREATININE 1.50 (H) 04/29/2018 1425   CALCIUM 8.8 (L) 04/29/2018 1425   PROT 6.7 04/01/2018 0911   ALBUMIN 3.7 04/01/2018 0911   AST 20 04/01/2018 0911   ALT 23 04/01/2018 0911   ALKPHOS 77 04/01/2018 0911   BILITOT 0.5 04/01/2018 0911   GFRNONAA 44 (L) 04/29/2018 1425   GFRAA 52 (L) 04/29/2018 1425    No results found for: SPEP, UPEP  Lab Results  Component Value Date   WBC 5.7 04/29/2018   NEUTROABS 5.1 04/29/2018   HGB 12.1 (L) 04/29/2018   HCT 35.5 (L) 04/29/2018   MCV 90.6 04/29/2018   PLT 193 04/29/2018      Chemistry      Component Value Date/Time   NA 137 04/29/2018 1425   K 4.5 04/29/2018 1425   CL 106 04/29/2018 1425   CO2 21 (L) 04/29/2018 1425   BUN 26 (H) 04/29/2018 1425   CREATININE 1.50 (H) 04/29/2018 1425      Component Value Date/Time   CALCIUM 8.8 (L) 04/29/2018 1425   ALKPHOS 77 04/01/2018 0911   AST 20 04/01/2018 0911   ALT 23 04/01/2018 0911   BILITOT 0.5 04/01/2018 0911       RADIOGRAPHIC STUDIES: I have personally reviewed the radiological images as listed and agreed with the findings in the report. No results found.   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 [See discussion below]  #Plan to proceed with gemcitabine in approximately 1 week [with resolution of diarrhea-see discussion below]; however get a CT  scan prior for baseline/approximately 1 week.  #Colitis/secondary to [facility]; status post infliximab infusions x2; most recent June 3rd.  Recommend stopping prednisone [blood sugars 560 today]  # CKD- stage III creat stable at baseline creatinine of 1.4/stable.  #Diabetes poorly controlled blood sugars 500+-second steroids; stopped prednisone.  Recommend checking blood sugars closely.   #Reviewed the gemcitabine side effects; stable again in detail.  #Recommend follow-up in 1 week; gemcitabine/labs CT scan prior.    Orders Placed This Encounter  Procedures  . CT CHEST WO  CONTRAST    Standing Status:   Future    Standing Expiration Date:   04/30/2019    Order Specific Question:   Preferred imaging location?    Answer:   [facility]    Order Specific Question:   Radiology Contrast Protocol - do NOT remove file path    Answer:   [email or web address]  . CBC with Differential    Standing Status:   Future    Standing Expiration Date:   04/29/2019  . Basic metabolic panel    Standing Status:   Future    Standing Expiration Date:   04/29/2019   All questions were answered. The patient knows to call the clinic with any problems, questions or concerns.      Cammie Sickle, MD 04/29/2018 10:24 PM

## 2018-04-30 ENCOUNTER — Ambulatory Visit: Payer: Medicare Other | Admitting: Internal Medicine

## 2018-04-30 ENCOUNTER — Other Ambulatory Visit: Payer: Medicare Other

## 2018-04-30 DIAGNOSIS — K219 Gastro-esophageal reflux disease without esophagitis: Secondary | ICD-10-CM | POA: Diagnosis not present

## 2018-04-30 DIAGNOSIS — Z5111 Encounter for antineoplastic chemotherapy: Secondary | ICD-10-CM | POA: Diagnosis present

## 2018-04-30 DIAGNOSIS — E119 Type 2 diabetes mellitus without complications: Secondary | ICD-10-CM | POA: Diagnosis not present

## 2018-04-30 DIAGNOSIS — R21 Rash and other nonspecific skin eruption: Secondary | ICD-10-CM | POA: Diagnosis not present

## 2018-04-30 DIAGNOSIS — R509 Fever, unspecified: Secondary | ICD-10-CM | POA: Diagnosis not present

## 2018-04-30 DIAGNOSIS — I129 Hypertensive chronic kidney disease with stage 1 through stage 4 chronic kidney disease, or unspecified chronic kidney disease: Secondary | ICD-10-CM | POA: Diagnosis not present

## 2018-04-30 DIAGNOSIS — E785 Hyperlipidemia, unspecified: Secondary | ICD-10-CM | POA: Diagnosis not present

## 2018-04-30 DIAGNOSIS — Z79899 Other long term (current) drug therapy: Secondary | ICD-10-CM | POA: Diagnosis not present

## 2018-04-30 DIAGNOSIS — C459 Mesothelioma, unspecified: Secondary | ICD-10-CM | POA: Diagnosis not present

## 2018-04-30 DIAGNOSIS — Z8673 Personal history of transient ischemic attack (TIA), and cerebral infarction without residual deficits: Secondary | ICD-10-CM | POA: Diagnosis not present

## 2018-04-30 DIAGNOSIS — K529 Noninfective gastroenteritis and colitis, unspecified: Secondary | ICD-10-CM | POA: Diagnosis not present

## 2018-04-30 DIAGNOSIS — N183 Chronic kidney disease, stage 3 (moderate): Secondary | ICD-10-CM | POA: Diagnosis not present

## 2018-04-30 LAB — THYROID PANEL WITH TSH
Free Thyroxine Index: 1.3 (ref 1.2–4.9)
T3 UPTAKE RATIO: 29 % (ref 24–39)
T4 TOTAL: 4.6 ug/dL (ref 4.5–12.0)
TSH: 1.65 u[IU]/mL (ref 0.450–4.500)

## 2018-05-06 ENCOUNTER — Ambulatory Visit
Admission: RE | Admit: 2018-05-06 | Discharge: 2018-05-06 | Disposition: A | Discharge status: Home / Self Care | Payer: Medicare Other | Source: Ambulatory Visit | Attending: Internal Medicine | Admitting: Internal Medicine

## 2018-05-06 ENCOUNTER — Telehealth: Payer: Self-pay | Admitting: *Deleted

## 2018-05-06 DIAGNOSIS — I700 Atherosclerosis of aorta: Secondary | ICD-10-CM | POA: Insufficient documentation

## 2018-05-06 DIAGNOSIS — A419 Sepsis, unspecified organism: Secondary | ICD-10-CM | POA: Diagnosis not present

## 2018-05-06 DIAGNOSIS — C7800 Secondary malignant neoplasm of unspecified lung: Secondary | ICD-10-CM

## 2018-05-06 DIAGNOSIS — C459 Mesothelioma, unspecified: Secondary | ICD-10-CM | POA: Insufficient documentation

## 2018-05-06 DIAGNOSIS — I2510 Atherosclerotic heart disease of native coronary artery without angina pectoris: Secondary | ICD-10-CM | POA: Insufficient documentation

## 2018-05-06 DIAGNOSIS — R932 Abnormal findings on diagnostic imaging of liver and biliary tract: Secondary | ICD-10-CM

## 2018-05-06 NOTE — Telephone Encounter (Signed)
Patient called wanting to be seen today because he is running  Fever. Discussed with Lorretta Harp, NP who advises he take Tylenol today and she can seen him first thing tomorrow morning. I returned call to patient who states he already has appointment tomorrow at 845 and he will take Tylenol tonight and see Dr Janese Banks in AM

## 2018-05-07 ENCOUNTER — Emergency Department: Payer: Medicare Other

## 2018-05-07 ENCOUNTER — Inpatient Hospital Stay: Payer: Medicare Other

## 2018-05-07 ENCOUNTER — Encounter: Payer: Self-pay | Admitting: Oncology

## 2018-05-07 ENCOUNTER — Inpatient Hospital Stay (HOSPITAL_BASED_OUTPATIENT_CLINIC_OR_DEPARTMENT_OTHER): Payer: Medicare Other | Admitting: Oncology

## 2018-05-07 ENCOUNTER — Inpatient Hospital Stay
Admission: EM | Admit: 2018-05-07 | Discharge: 2018-05-12 | DRG: 872 | Disposition: A | Discharge status: Home / Self Care | Payer: Medicare Other | Attending: Internal Medicine | Admitting: Internal Medicine

## 2018-05-07 ENCOUNTER — Encounter: Payer: Self-pay | Admitting: *Deleted

## 2018-05-07 ENCOUNTER — Other Ambulatory Visit: Payer: Self-pay | Admitting: Nurse Practitioner

## 2018-05-07 ENCOUNTER — Other Ambulatory Visit: Payer: Self-pay

## 2018-05-07 ENCOUNTER — Telehealth: Payer: Self-pay | Admitting: *Deleted

## 2018-05-07 VITALS — BP 123/74 | HR 76 | Temp 99.5°F | Resp 18 | Ht 70.0 in | Wt 190.8 lb

## 2018-05-07 DIAGNOSIS — L270 Generalized skin eruption due to drugs and medicaments taken internally: Secondary | ICD-10-CM | POA: Diagnosis not present

## 2018-05-07 DIAGNOSIS — E1122 Type 2 diabetes mellitus with diabetic chronic kidney disease: Secondary | ICD-10-CM | POA: Diagnosis present

## 2018-05-07 DIAGNOSIS — N183 Chronic kidney disease, stage 3 (moderate): Secondary | ICD-10-CM | POA: Diagnosis present

## 2018-05-07 DIAGNOSIS — E440 Moderate protein-calorie malnutrition: Secondary | ICD-10-CM | POA: Diagnosis present

## 2018-05-07 DIAGNOSIS — Z79899 Other long term (current) drug therapy: Secondary | ICD-10-CM

## 2018-05-07 DIAGNOSIS — C459 Mesothelioma, unspecified: Secondary | ICD-10-CM

## 2018-05-07 DIAGNOSIS — T50905A Adverse effect of unspecified drugs, medicaments and biological substances, initial encounter: Secondary | ICD-10-CM | POA: Diagnosis not present

## 2018-05-07 DIAGNOSIS — Z6827 Body mass index (BMI) 27.0-27.9, adult: Secondary | ICD-10-CM

## 2018-05-07 DIAGNOSIS — C457 Mesothelioma of other sites: Secondary | ICD-10-CM | POA: Diagnosis present

## 2018-05-07 DIAGNOSIS — Z8673 Personal history of transient ischemic attack (TIA), and cerebral infarction without residual deficits: Secondary | ICD-10-CM | POA: Diagnosis not present

## 2018-05-07 DIAGNOSIS — E119 Type 2 diabetes mellitus without complications: Secondary | ICD-10-CM

## 2018-05-07 DIAGNOSIS — K219 Gastro-esophageal reflux disease without esophagitis: Secondary | ICD-10-CM | POA: Diagnosis present

## 2018-05-07 DIAGNOSIS — Z5111 Encounter for antineoplastic chemotherapy: Secondary | ICD-10-CM

## 2018-05-07 DIAGNOSIS — R509 Fever, unspecified: Secondary | ICD-10-CM

## 2018-05-07 DIAGNOSIS — E860 Dehydration: Secondary | ICD-10-CM | POA: Diagnosis present

## 2018-05-07 DIAGNOSIS — K521 Toxic gastroenteritis and colitis: Secondary | ICD-10-CM | POA: Diagnosis present

## 2018-05-07 DIAGNOSIS — D72810 Lymphocytopenia: Secondary | ICD-10-CM | POA: Diagnosis present

## 2018-05-07 DIAGNOSIS — Z7902 Long term (current) use of antithrombotics/antiplatelets: Secondary | ICD-10-CM | POA: Diagnosis not present

## 2018-05-07 DIAGNOSIS — K529 Noninfective gastroenteritis and colitis, unspecified: Secondary | ICD-10-CM | POA: Diagnosis not present

## 2018-05-07 DIAGNOSIS — E1142 Type 2 diabetes mellitus with diabetic polyneuropathy: Secondary | ICD-10-CM | POA: Diagnosis present

## 2018-05-07 DIAGNOSIS — T451X5A Adverse effect of antineoplastic and immunosuppressive drugs, initial encounter: Secondary | ICD-10-CM | POA: Diagnosis present

## 2018-05-07 DIAGNOSIS — I2510 Atherosclerotic heart disease of native coronary artery without angina pectoris: Secondary | ICD-10-CM | POA: Diagnosis present

## 2018-05-07 DIAGNOSIS — Z882 Allergy status to sulfonamides status: Secondary | ICD-10-CM | POA: Diagnosis not present

## 2018-05-07 DIAGNOSIS — Z881 Allergy status to other antibiotic agents status: Secondary | ICD-10-CM | POA: Diagnosis not present

## 2018-05-07 DIAGNOSIS — Z7952 Long term (current) use of systemic steroids: Secondary | ICD-10-CM

## 2018-05-07 DIAGNOSIS — A419 Sepsis, unspecified organism: Principal | ICD-10-CM | POA: Diagnosis present

## 2018-05-07 DIAGNOSIS — Z9221 Personal history of antineoplastic chemotherapy: Secondary | ICD-10-CM | POA: Diagnosis not present

## 2018-05-07 DIAGNOSIS — Z7982 Long term (current) use of aspirin: Secondary | ICD-10-CM | POA: Diagnosis not present

## 2018-05-07 DIAGNOSIS — Z87891 Personal history of nicotine dependence: Secondary | ICD-10-CM

## 2018-05-07 DIAGNOSIS — Z9109 Other allergy status, other than to drugs and biological substances: Secondary | ICD-10-CM | POA: Diagnosis not present

## 2018-05-07 DIAGNOSIS — R6510 Systemic inflammatory response syndrome (SIRS) of non-infectious origin without acute organ dysfunction: Secondary | ICD-10-CM | POA: Diagnosis present

## 2018-05-07 DIAGNOSIS — N189 Chronic kidney disease, unspecified: Secondary | ICD-10-CM | POA: Diagnosis not present

## 2018-05-07 DIAGNOSIS — C7800 Secondary malignant neoplasm of unspecified lung: Secondary | ICD-10-CM | POA: Diagnosis present

## 2018-05-07 DIAGNOSIS — R5081 Fever presenting with conditions classified elsewhere: Secondary | ICD-10-CM | POA: Diagnosis not present

## 2018-05-07 DIAGNOSIS — I129 Hypertensive chronic kidney disease with stage 1 through stage 4 chronic kidney disease, or unspecified chronic kidney disease: Secondary | ICD-10-CM | POA: Diagnosis present

## 2018-05-07 DIAGNOSIS — E785 Hyperlipidemia, unspecified: Secondary | ICD-10-CM | POA: Diagnosis present

## 2018-05-07 LAB — CBC WITH DIFFERENTIAL/PLATELET
BASOS ABS: 0.1 10*3/uL (ref 0–0.1)
BASOS PCT: 1 %
Basophils Absolute: 0.1 10*3/uL (ref 0–0.1)
Basophils Relative: 1 %
EOS ABS: 0.1 10*3/uL (ref 0–0.7)
EOS ABS: 0 10*3/uL (ref 0–0.7)
EOS PCT: 1 %
Eosinophils Relative: 1 %
HCT: 33.4 % — ABNORMAL LOW (ref 40.0–52.0)
HEMATOCRIT: 33.5 % — AB (ref 40.0–52.0)
HEMOGLOBIN: 11.7 g/dL — AB (ref 13.0–18.0)
Hemoglobin: 11.8 g/dL — ABNORMAL LOW (ref 13.0–18.0)
LYMPHS ABS: 0.4 10*3/uL — AB (ref 1.0–3.6)
LYMPHS PCT: 5 %
Lymphocytes Relative: 12 %
Lymphs Abs: 0.7 10*3/uL — ABNORMAL LOW (ref 1.0–3.6)
MCH: 30.8 pg (ref 26.0–34.0)
MCH: 30.7 pg (ref 26.0–34.0)
MCHC: 35.3 g/dL (ref 32.0–36.0)
MCHC: 35 g/dL (ref 32.0–36.0)
MCV: 87.4 fL (ref 80.0–100.0)
MCV: 87.9 fL (ref 80.0–100.0)
MONOS PCT: 7 %
Monocytes Absolute: 0.6 10*3/uL (ref 0.2–1.0)
Monocytes Absolute: 0.5 10*3/uL (ref 0.2–1.0)
Monocytes Relative: 12 %
NEUTROS PCT: 86 %
Neutro Abs: 3.9 10*3/uL (ref 1.4–6.5)
Neutro Abs: 5.9 10*3/uL (ref 1.4–6.5)
Neutrophils Relative %: 74 %
Platelets: 194 10*3/uL (ref 150–440)
Platelets: 213 10*3/uL (ref 150–440)
RBC: 3.82 MIL/uL — AB (ref 4.40–5.90)
RBC: 3.81 MIL/uL — ABNORMAL LOW (ref 4.40–5.90)
RDW: 13.3 % (ref 11.5–14.5)
RDW: 13.5 % (ref 11.5–14.5)
WBC: 5.3 10*3/uL (ref 3.8–10.6)
WBC: 6.8 10*3/uL (ref 3.8–10.6)

## 2018-05-07 LAB — COMPREHENSIVE METABOLIC PANEL
ALBUMIN: 3.4 g/dL — AB (ref 3.5–5.0)
ALK PHOS: 110 U/L (ref 38–126)
ALT: 33 U/L (ref 17–63)
AST: 39 U/L (ref 15–41)
Anion gap: 14 (ref 5–15)
BILIRUBIN TOTAL: 1 mg/dL (ref 0.3–1.2)
BUN: 23 mg/dL — AB (ref 6–20)
CO2: 18 mmol/L — AB (ref 22–32)
Calcium: 8.4 mg/dL — ABNORMAL LOW (ref 8.9–10.3)
Chloride: 105 mmol/L (ref 101–111)
Creatinine, Ser: 1.56 mg/dL — ABNORMAL HIGH (ref 0.61–1.24)
GFR calc Af Amer: 49 mL/min — ABNORMAL LOW (ref >60)
GFR calc non Af Amer: 42 mL/min — ABNORMAL LOW (ref >60)
GLUCOSE: 199 mg/dL — AB (ref 65–99)
Potassium: 3.4 mmol/L — ABNORMAL LOW (ref 3.5–5.1)
SODIUM: 137 mmol/L (ref 135–145)
TOTAL PROTEIN: 6.9 g/dL (ref 6.5–8.1)

## 2018-05-07 LAB — BASIC METABOLIC PANEL
Anion gap: 9 (ref 5–15)
BUN: 20 mg/dL (ref 6–20)
CO2: 23 mmol/L (ref 22–32)
CREATININE: 1.59 mg/dL — AB (ref 0.61–1.24)
Calcium: 8.8 mg/dL — ABNORMAL LOW (ref 8.9–10.3)
Chloride: 107 mmol/L (ref 101–111)
GFR calc Af Amer: 48 mL/min — ABNORMAL LOW (ref >60)
GFR, EST NON AFRICAN AMERICAN: 41 mL/min — AB (ref >60)
Glucose, Bld: 191 mg/dL — ABNORMAL HIGH (ref 65–99)
Potassium: 4.1 mmol/L (ref 3.5–5.1)
Sodium: 139 mmol/L (ref 135–145)

## 2018-05-07 LAB — PROTIME-INR
INR: 1.21
PROTHROMBIN TIME: 15.2 s (ref 11.4–15.2)

## 2018-05-07 LAB — GLUCOSE, CAPILLARY: Glucose-Capillary: 218 mg/dL — ABNORMAL HIGH (ref 65–99)

## 2018-05-07 LAB — LACTIC ACID, PLASMA
Lactic Acid, Venous: 1.1 mmol/L (ref 0.5–1.9)
Lactic Acid, Venous: 0.9 mmol/L (ref 0.5–1.9)

## 2018-05-07 LAB — HEPATIC FUNCTION PANEL
ALT: 33 U/L (ref 17–63)
AST: 33 U/L (ref 15–41)
Albumin: 3.3 g/dL — ABNORMAL LOW (ref 3.5–5.0)
Alkaline Phosphatase: 110 U/L (ref 38–126)
Total Bilirubin: 0.8 mg/dL (ref 0.3–1.2)
Total Protein: 6.6 g/dL (ref 6.5–8.1)

## 2018-05-07 LAB — TSH: TSH: 2.511 u[IU]/mL (ref 0.350–4.500)

## 2018-05-07 MED ORDER — VANCOMYCIN HCL IN DEXTROSE 750-5 MG/150ML-% IV SOLN
750.0000 mg | INTRAVENOUS | Status: DC
  Administered 2018-05-08 – 2018-05-10 (×4): 750 mg via INTRAVENOUS
  Filled 2018-05-07 (×4): qty 150

## 2018-05-07 MED ORDER — ONDANSETRON HCL 4 MG PO TABS
4.0000 mg | ORAL_TABLET | Freq: Four times a day (QID) | ORAL | Status: DC | PRN
  Administered 2018-05-09: 13:00:00 4 mg via ORAL
  Filled 2018-05-07: qty 1

## 2018-05-07 MED ORDER — SODIUM CHLORIDE 0.9 % IV SOLN
2000.0000 mg | Freq: Once | INTRAVENOUS | Status: AC
  Administered 2018-05-07: 2000 mg via INTRAVENOUS
  Filled 2018-05-07: qty 52.6

## 2018-05-07 MED ORDER — SODIUM CHLORIDE 0.9 % IV SOLN
2.0000 g | Freq: Once | INTRAVENOUS | Status: AC
  Administered 2018-05-07: 2 g via INTRAVENOUS
  Filled 2018-05-07: qty 2

## 2018-05-07 MED ORDER — PIPERACILLIN-TAZOBACTAM 3.375 G IVPB 30 MIN
3.3750 g | Freq: Once | INTRAVENOUS | Status: DC
  Filled 2018-05-07: qty 50

## 2018-05-07 MED ORDER — LEVOFLOXACIN IN D5W 750 MG/150ML IV SOLN
750.0000 mg | Freq: Once | INTRAVENOUS | Status: AC
  Administered 2018-05-07: 750 mg via INTRAVENOUS
  Filled 2018-05-07: qty 150

## 2018-05-07 MED ORDER — VANCOMYCIN HCL IN DEXTROSE 750-5 MG/150ML-% IV SOLN
750.0000 mg | Freq: Once | INTRAVENOUS | Status: AC
  Administered 2018-05-07: 750 mg via INTRAVENOUS
  Filled 2018-05-07 (×2): qty 150

## 2018-05-07 MED ORDER — ACETAMINOPHEN 650 MG RE SUPP: 650.0000 mg | Freq: Four times a day (QID) | RECTAL | Status: DC | PRN

## 2018-05-07 MED ORDER — ONDANSETRON HCL 4 MG/2ML IJ SOLN
4.0000 mg | Freq: Four times a day (QID) | INTRAMUSCULAR | Status: DC | PRN
Start: 2018-05-07 — End: 2018-05-12

## 2018-05-07 MED ORDER — DOCUSATE SODIUM 100 MG PO CAPS
100.0000 mg | ORAL_CAPSULE | Freq: Two times a day (BID) | ORAL | Status: DC
  Filled 2018-05-07 (×4): qty 1

## 2018-05-07 MED ORDER — PROCHLORPERAZINE MALEATE 10 MG PO TABS
10.0000 mg | ORAL_TABLET | Freq: Once | ORAL | Status: AC
  Administered 2018-05-07: 10 mg via ORAL
  Filled 2018-05-07: qty 1

## 2018-05-07 MED ORDER — SODIUM CHLORIDE 0.9 % IV SOLN
2.0000 g | Freq: Three times a day (TID) | INTRAVENOUS | Status: DC
  Administered 2018-05-08 – 2018-05-11 (×11): 2 g via INTRAVENOUS
  Filled 2018-05-07 (×15): qty 2

## 2018-05-07 MED ORDER — SODIUM CHLORIDE 0.9 % IV SOLN
Freq: Once | INTRAVENOUS | Status: AC
  Administered 2018-05-07: 11:00:00 via INTRAVENOUS
  Filled 2018-05-07: qty 1000

## 2018-05-07 MED ORDER — VANCOMYCIN HCL IN DEXTROSE 1-5 GM/200ML-% IV SOLN
1000.0000 mg | Freq: Once | INTRAVENOUS | Status: DC
  Filled 2018-05-07: qty 200

## 2018-05-07 MED ORDER — LEVOFLOXACIN 250 MG PO TABS: ORAL_TABLET | ORAL | 0 refills | Status: DC

## 2018-05-07 MED ORDER — ENOXAPARIN SODIUM 40 MG/0.4ML ~~LOC~~ SOLN
40.0000 mg | SUBCUTANEOUS | Status: DC
  Administered 2018-05-08 – 2018-05-11 (×4): 40 mg via SUBCUTANEOUS
  Filled 2018-05-07 (×4): qty 0.4

## 2018-05-07 MED ORDER — ACETAMINOPHEN 325 MG PO TABS
650.0000 mg | ORAL_TABLET | Freq: Four times a day (QID) | ORAL | Status: DC | PRN
  Administered 2018-05-08 – 2018-05-10 (×6): 650 mg via ORAL
  Filled 2018-05-07 (×6): qty 2

## 2018-05-07 MED ORDER — SODIUM CHLORIDE 0.9 % IV BOLUS
1000.0000 mL | Freq: Once | INTRAVENOUS | Status: AC
  Administered 2018-05-07: 1000 mL via INTRAVENOUS

## 2018-05-07 MED ORDER — SODIUM CHLORIDE 0.9 % IV SOLN
INTRAVENOUS | Status: DC
  Administered 2018-05-07 – 2018-05-09 (×4): via INTRAVENOUS

## 2018-05-07 NOTE — ED Triage Notes (Signed)
Pt ambulatory to triage.  Pt has a fever, sob.  No chest pain.   Pt reports chemotherapy today for lung cancer.  Pt alert.

## 2018-05-07 NOTE — Progress Notes (Signed)
Received message to call patient regarding fever after chemo. Discussed with Dr. Janese Banks.   Patient started on levaquin (dose reduced due to renal impairment). Orders placed for labs and chest x-ray tomorrow.   Patient called. He is to come to clinic tomorrow for labs and evaluation in [facility]. He is to start antibiotics tonight, increase fluid intake, and take tylenol for fever (not to exceed 3 grams in 24 hours).

## 2018-05-07 NOTE — Progress Notes (Signed)
Per Judeen Hammans RN per Dr. Janese Banks okay to proceed with treatment with creatinine of 1.54.   Pt tolerated Gemzar infusion well. Pt stable at discharge.

## 2018-05-07 NOTE — ED Notes (Signed)
Patient transported to 123 

## 2018-05-07 NOTE — Progress Notes (Signed)
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 PN  # MOLECULAR TESTING- PDL-1- 1% [LOW]; [facility]- No actionable**  -----------------------------------   DIAGNOSIS: [AUG 2019 ] ? [name]   STAGE:  IV  ;GOALS: Palliative  CURRENT/MOST RECENT THERAPY [april2019 ]; last keytruda; Gem on HOLD      [name], malignant ([place])   03/19/2018 -  Chemotherapy    The patient had gemcitabine (GEMZAR) 2,000 mg in sodium chloride 0.9 % 250 mL chemo infusion, 2,128 mg,  Intravenous,  Once, 1 of 4 cycles Administration: 2,000 mg (05/07/2018)  for chemotherapy treatment.         Interval history- patient still reports loose stool about once a day. This is overall improved since before. He is off steroids now. He has been keeping a tab of his blood sugars at home and they have been rangings between 150's-200s.   Reports that his temp was about 100.3 when he took at home. No other symptoms. No cough, runny nose, expectoration, abdominal pain  ECOG PS- 1 Pain scale- 0  Review of systems- Review of Systems  Constitutional: Positive for malaise/fatigue. Negative for chills, fever and weight loss.  HENT: Negative for congestion, ear discharge and nosebleeds.   Eyes: Negative for blurred vision.  Respiratory: Negative for cough, hemoptysis, sputum production, shortness of breath and wheezing.   Cardiovascular: Negative for chest pain, palpitations, orthopnea and claudication.  Gastrointestinal: Positive for diarrhea. Negative for abdominal pain, blood in stool, constipation, heartburn, melena, nausea and vomiting.  Genitourinary: Negative for dysuria, flank pain, frequency, hematuria and urgency.  Musculoskeletal: Negative for back pain, joint pain and myalgias.  Skin: Negative for rash.  Neurological: Negative for dizziness, tingling, focal weakness, seizures, weakness and headaches.  Endo/Heme/Allergies: Does not bruise/bleed easily.  Psychiatric/Behavioral: Negative for depression and suicidal ideas. The patient does not have insomnia.       Allergies  Allergen Reactions  . Sulfa Antibiotics Swelling    Facial swelling No tongue or lips swelling, no difficulty breathing.  . Adhesive [Tape] Itching and Rash  . Amoxicillin Nausea Only    Has patient had a PCN reaction causing immediate rash, facial/tongue/throat swelling, SOB or lightheadedness with hypotension: No Has patient had a  PCN reaction causing severe rash involving mucus membranes or skin  necrosis: No Has patient had a PCN reaction that required hospitalization: No Has patient had a PCN reaction occurring within the last 10 years: Yes If all of the above answers are "NO", then may proceed with Cephalosporin use.   . Other Rash    Surgical tape     Past Medical History:  Diagnosis Date  . Benign essential hypertension 05/31/2014  . Cancer ([place]) 06/2017   right lung   . Cerebral infarction ([place]) 05/31/2014  . Cerebrovascular disease 05/31/2014  . CKD (chronic kidney disease) stage 3, GFR 30-59 ml/min (HCC) 02/23/2016  . CVA (cerebral infarction)   . Diabetes mellitus without complication ([place])   . Difficult intubation   . DVT (deep venous thrombosis) ([place])   . Esophageal reflux 05/31/2014  . H/O: CVA (cerebrovascular accident)   . Hyperlipidemia   . Hypertension   . ICAO (internal carotid artery occlusion) March 01, 2014   Left  . 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 Pleural effusion 04/23/2017  . Stroke [facility]) April, 4,2015  . Type 2 diabetes mellitus with peripheral neuropathy ([place]) 02/23/2016  . Weakness      Past Surgical History:  Procedure Laterality Date  . CHEST TUBE INSERTION N/A 06/25/2017   Procedure: LZJQBH CATHETER INSERTION;  Surgeon: Nestor Lewandowsky, MD;  Location: [facility] ORS;  Service: Thoracic;  Laterality: N/A;  . CHEST TUBE INSERTION Right 08/07/2017   Procedure: REMOVAL OF PLEUR X CATHETER;  Surgeon: Nestor Lewandowsky, MD;  Location: [facility] ORS;  Service: General;  Laterality: Right;  . NASAL SINUS SURGERY  2000  . TONSILLECTOMY    . VIDEO ASSISTED THORACOSCOPY Right 06/25/2017   Procedure: VIDEO ASSISTED THORACOSCOPY WITH TALC PLEURODESIS, CHEST TUBE INSERTION;  Surgeon: Nestor Lewandowsky, MD;  Location: [facility] ORS;  Service: Thoracic;  Laterality: Right;    Social History   Socioeconomic History  . Marital status: Married    Spouse name: Not on file  . Number of children: Not on file  . Years of education: Not on file  . Highest  education level: Not on file  Occupational History  . Not on file  Social Needs  . Financial resource strain: Not on file  . Food insecurity:    Worry: Not on file    Inability: Not on file  . Transportation needs:    Medical: Not on file    Non-medical: Not on file  Tobacco Use  . Smoking status: Former Smoker    Types: Pipe    Last attempt to quit: 05/07/1974    Years since quitting: 44.0  . Smokeless tobacco: Never Used  . Tobacco comment: pt states he smoked a pipe a long time ago  Substance and Sexual Activity  . Alcohol use: No  . Drug use: No  . Sexual activity: Not on file  Lifestyle  . Physical activity:    Days per week: Not on file    Minutes per session: Not on file  . Stress: Not on file  Relationships  . Social connections:    Talks on phone: Not on file    Gets together: Not on file    Attends religious service: Not on file    Active member of club or organization: Not on file    Attends meetings of clubs or organizations: Not on file    Relationship status: Not on file  . Intimate partner violence:    Fear of current or ex  partner: Not on file    Emotionally abused: Not on file    Physically abused: Not on file    Forced sexual activity: Not on file  Other Topics Concern  . Not on file  Social History Narrative  . Not on file    Family History  Problem Relation Age of Onset  . Hypertension Mother   . Hypertension Father     No current facility-administered medications for this visit.   Current Outpatient Medications:  .  amLODipine (NORVASC) 5 MG tablet, Take 5 mg by mouth daily., Disp: , Rfl:  .  aspirin EC 81 MG tablet, Take 81 mg by mouth daily., Disp: , Rfl:  .  clopidogrel (PLAVIX) 75 MG tablet, Take 75 mg by mouth daily., Disp: , Rfl:  .  fluticasone (FLONASE) 50 MCG/ACT nasal spray, Place 1 spray into both nostrils daily at 2 PM. , Disp: , Rfl:  .  liraglutide (VICTOZA) 18 MG/3ML SOPN, Inject 1.2 mg into the skin daily., Disp: , Rfl:    .  lisinopril (PRINIVIL,ZESTRIL) 10 MG tablet, Take 10 mg by mouth daily., Disp: , Rfl:  .  metoprolol succinate (TOPROL-XL) 25 MG 24 hr tablet, Take 25 mg by mouth daily., Disp: , Rfl:  .  Multiple Vitamins-Minerals (CENTRUM SILVER PO), Take 1 tablet by mouth daily., Disp: , Rfl:  .  pravastatin (PRAVACHOL) 40 MG tablet, Take 40 mg by mouth daily., Disp: , Rfl:  .  Homeopathic Products (NERVE PAIN RELIEF SL), Take 1 tablet by mouth daily. NERVE RENEW (METHYLCOBALAMIN-BENFOTIAMINE-STABILIZED R-ALPHA LIPOIC ACID), Disp: , Rfl:  .  lansoprazole (PREVACID) 30 MG capsule, TAKE 1 CAPSULE BY MOUTH DAILY BEFORE BREAKFAST (Patient not taking: Reported on 05/07/2018), Disp: 30 capsule, Rfl: 4 .  levofloxacin (LEVAQUIN) 250 MG tablet, On Day 1, take 2 tablets (500 mg) by mouth once. On days 2-6, take 1 tablet (250 mg) by mouth once daily., Disp: 8 tablet, Rfl: 0 .  magic mouthwash w/lidocaine SOLN, Take 10 mLs by mouth 3 (three) times daily. (Patient not taking: Reported on 05/07/2018), Disp: 300 mL, Rfl: 0 .  ondansetron (ZOFRAN) 8 MG tablet, Take 8 mg by mouth every 8 (eight) hours as needed for nausea or refractory nausea / vomiting. , Disp: , Rfl:  .  polyethylene glycol powder (GLYCOLAX/MIRALAX) powder, Take 17 g by mouth daily as needed (for constipation.)., Disp: , Rfl:  .  predniSONE (DELTASONE) 20 MG tablet, Take 1 pill a day; do not stop until further directed. (Patient not taking: Reported on 05/07/2018), Disp: 40 tablet, Rfl: 0 .  prochlorperazine (COMPAZINE) 10 MG tablet, Take 10 mg by mouth every 8 (eight) hours as needed for vomiting. , Disp: , Rfl:   Facility-Administered Medications Ordered in Other Visits:  .  piperacillin-tazobactam (ZOSYN) IVPB 3.375 g, 3.375 g, Intravenous, Once, Schaevitz, Randall An, MD .  sodium chloride 0.9 % bolus 1,000 mL, 1,000 mL, Intravenous, Once, Schaevitz, Randall An, MD .  vancomycin (VANCOCIN) IVPB 1000 mg/200 mL premix, 1,000 mg, Intravenous, Once,  Orbie Pyo, MD  Physical exam:  Vitals:   05/07/18 0920  BP: 123/74  Pulse: 76  Resp: 18  Temp: 99.5 F (37.5 C)  TempSrc: Tympanic  Weight: 190 lb 12.8 oz (86.5 kg)  Height: _0  (1.778 m)   Physical Exam  Constitutional: He is oriented to person, place, and time. He appears well-developed and well-nourished.  HENT:  Head: Normocephalic and atraumatic.  Eyes: Pupils are equal, round, and reactive to light.  EOM are normal.  Neck: Normal range of motion.  Cardiovascular: Normal rate, regular rhythm and normal heart sounds.  Pulmonary/Chest: Effort normal and breath sounds normal.  Abdominal: Soft. Bowel sounds are normal.  Neurological: He is alert and oriented to person, place, and time.  Skin: Skin is warm and dry.     CMP Latest Ref Rng & Units 05/07/2018  Glucose 65 - 99 mg/dL 191(H)  BUN 6 - 20 mg/dL 20  Creatinine 0.61 - 1.24 mg/dL 1.59(H)  Sodium 135 - 145 mmol/L 139  Potassium 3.5 - 5.1 mmol/L 4.1  Chloride 101 - 111 mmol/L 107  CO2 22 - 32 mmol/L 23  Calcium 8.9 - 10.3 mg/dL 8.8(L)  Total Protein 6.5 - 8.1 g/dL 6.6  Total Bilirubin 0.3 - 1.2 mg/dL 0.8  Alkaline Phos 38 - 126 U/L 110  AST 15 - 41 U/L 33  ALT 17 - 63 U/L 33   CBC Latest Ref Rng & Units 05/07/2018  WBC 3.8 - 10.6 K/uL 5.3  Hemoglobin 13.0 - 18.0 g/dL 11.8(L)  Hematocrit 40.0 - 52.0 % 33.4(L)  Platelets 150 - 440 K/uL 194    No images are attached to the encounter.  Ct Chest Wo Contrast  Result Date: 05/06/2018 CLINICAL DATA:  72 year old male with history of metastatic unresectable mesothelioma. Follow-up study. EXAM: CT CHEST WITHOUT CONTRAST TECHNIQUE: Multidetector CT imaging of the chest was performed following the standard protocol without IV contrast. COMPARISON:  Chest CT 02/07/2018. FINDINGS: Cardiovascular: Heart size is normal. There is no significant pericardial fluid, thickening or pericardial calcification. There is aortic atherosclerosis, as well as atherosclerosis  of the great vessels of the mediastinum and the coronary arteries, including calcified atherosclerotic plaque in the left anterior descending, left circumflex and right coronary arteries. Mediastinum/Nodes: No pathologically enlarged mediastinal or hilar lymph nodes. Please note that accurate exclusion of hilar adenopathy is limited on noncontrast CT scans. Esophagus is unremarkable in appearance. No axillary lymphadenopathy. Lungs/Pleura: Previously noted pleural-based mass in the inferior aspect of the right hemithorax appears grossly less bulky than the prior study measuring approximately 4.9 x 2.7 cm on today's examination (axial image 111 of series 2). Some adjacent pleural thickening and trace volume of right-sided pleural fluid. Some other calcified pleural plaques are noted in the right hemithorax. Previously noted left lower lobe pulmonary nodule has significantly increased in size, currently measuring 15 mm in diameter (axial image 77 of series 3) as compared with 6 mm on prior study from 01/28/2018. Likewise, a right middle lobe nodule which was previously only 2 mm in size currently measures 10 x 8 mm (axial image 99 of series 3). 7 mm nodule in the periphery of the right upper lobe (axial image 68 of series 3) increased from 2 mm on the prior examination. Several other smaller 2-3 mm pulmonary nodules are noted in the lungs, stable compared to prior examinations and nonspecific but likely benign. No consolidative airspace disease. Upper Abdomen: Aortic atherosclerosis. Extension of right pleural base mass into right lobe of the liver is again noted, with clear progression compared to the prior examination. The area of hepatic involvement now encompassing approximately 7.9 x 11.2 cm (axial image 123 of series 2) involving portions of segments 6, 7 and 8. Musculoskeletal: There are no aggressive appearing lytic or blastic lesions noted in the visualized portions of the skeleton. IMPRESSION: 1. Mixed  response to therapy. Specifically, while the primary lesion in the base of the right hemithorax has decreased in size, direct extension  into the right lobe of the liver, as well as developing multifocal pulmonary metastases is noted on today's examination. 2. Aortic atherosclerosis, in addition to three-vessel coronary artery disease. Assessment for potential risk factor modification, dietary therapy or pharmacologic therapy may be warranted, if clinically indicated. Aortic Atherosclerosis (ICD10-I70.0). Electronically Signed   By: Vinnie Langton M.D.   On: 05/06/2018 13:20     Assessment and plan- Patient is a 72 y.o. male with malignant mesothelioma here for on treatment assessment prior to cycle 1 day 1 of gemcitabine  1. Overall diarrhea is better and he is off steroids. I also reviewed his CT thorax images independently and discussed findings with the patient. altghough there is decrease in the size of primary mass, bilateral lung nodules have increased in size concerning for disease progression on keytruda. Moreover he had [place] induced colitis. Therefore ok to switch to gemcitabine at this point  2. Counts ok to proceed to proceed with Day 1 cycle of gemcitabine at 1000 mg/meter square today  3. I will see him back in 1 week with cbc/ cmp for cycle 1 day 8 of gemcitabine  4. DR. Rogue Bussing to decide if he would like to do chemotherapy 3 weeks on 1 week off or 2 week on 1 week off regimen when he gets back in 2 weeks  5. Patient afebrile in our office today. He is not tachycardic. No localizing signs of infection. I therefore did not prescribe any antibiotics. However, after patient completed his chemotherapy and left our office, he called reporting a fever of 102 at home. He will therefore come back to our office tomorrow for CXR, blood cultures and UA. We will prescribe empiric levaquin for 7 days   Visit Diagnosis 1. [name], malignant ([place])   2. Encounter for antineoplastic  chemotherapy      Dr. Randa Evens, MD, MPH [facility] [phone] 05/07/2018 8:56 PM

## 2018-05-07 NOTE — ED Notes (Signed)
IRIS RN, aware of bed assigned

## 2018-05-07 NOTE — ED Provider Notes (Signed)
[facility] Emergency Department Provider Note  ___________________________________________   First MD Initiated Contact with Patient 05/07/18 2016     (approximate)  I have reviewed the triage vital signs and the nursing notes.   HISTORY  Chief Complaint Fever   HPI Ian Shaffer is a 72 y.o. 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  He says that he also has a cough with mild nasal congestion.  Otherwise, denies any pain.  No burning with urination.  Came to the hospital because of fever at home of 104.   Past Medical History:  Diagnosis Date  . Benign essential hypertension 05/31/2014  . Cancer ([place]) 06/2017   right lung   . Cerebral infarction ([place]) 05/31/2014  . Cerebrovascular disease 05/31/2014  . CKD (chronic kidney disease) stage 3, GFR 30-59 ml/min (HCC) 02/23/2016  . CVA (cerebral infarction)   . Diabetes mellitus without complication ([place])   . Difficult intubation   . DVT (deep venous thrombosis) ([place])   . Esophageal reflux 05/31/2014  . H/O: CVA (cerebrovascular accident)   . Hyperlipidemia   . Hypertension   . ICAO (internal carotid artery occlusion) March 01, 2014   Left  . 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 Pleural effusion 04/23/2017  . Stroke [facility]) April, 4,2015  . Type 2 diabetes mellitus with peripheral neuropathy ([place]) 02/23/2016  . Weakness     Patient Active Problem List   Diagnosis Date Noted  . Colitis, acute 04/15/2018  . Goals of care, counseling/discussion 02/28/2018  . Chemotherapy induced neutropenia ([place]) 09/17/2017  . Fever 09/08/2017  . 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 Lung mass 06/25/2017  . 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 Pleural effusion 04/23/2017  . CKD (chronic kidney disease) stage 3, GFR 30-59 ml/min (HCC) 02/23/2016  . Type 2 diabetes mellitus with  peripheral neuropathy ([place]) 02/23/2016  . Cerebrovascular disease 05/31/2014  . Cerebral infarction ( [place]) 05/31/2014  . Esophageal reflux 05/31/2014  . Benign essential hypertension 05/31/2014  . Other and unspecified hyperlipidemia 05/31/2014  . Type II or unspecified type diabetes mellitus without mention of complication, not stated as uncontrolled 05/31/2014    Past Surgical History:  Procedure Laterality Date  . CHEST TUBE INSERTION N/A 06/25/2017   Procedure: LPFXTK CATHETER INSERTION;  Surgeon: Nestor Lewandowsky, MD;  Location: [facility] ORS;  Service: Thoracic;  Laterality: N/A;  . CHEST TUBE INSERTION Right 08/07/2017   Procedure: REMOVAL OF PLEUR X CATHETER;  Surgeon: Nestor Lewandowsky, MD;  Location: [facility] ORS;  Service: General;  Laterality: Right;  . NASAL SINUS SURGERY  2000  . TONSILLECTOMY    . VIDEO ASSISTED THORACOSCOPY Right 06/25/2017   Procedure: VIDEO ASSISTED THORACOSCOPY WITH TALC PLEURODESIS, CHEST TUBE INSERTION;  Surgeon: Nestor Lewandowsky, MD;  Location: [facility] ORS;  Service: Thoracic;  Laterality: Right;    Prior to Admission medications   Medication Sig Start Date End Date Taking? Authorizing Provider  amLODipine (NORVASC) 5 MG tablet Take 5 mg by mouth daily.    [provider]  aspirin EC 81 MG tablet Take 81 mg by mouth daily.    [provider]  clopidogrel (PLAVIX) 75 MG tablet Take 75 mg by mouth daily.    [provider]  fluticasone (FLONASE) 50 MCG/ACT nasal spray Place 1 spray into both nostrils daily at 2 PM.     [provider]  Homeopathic Products (NERVE PAIN RELIEF SL) Take  1 tablet by mouth daily. NERVE RENEW (METHYLCOBALAMIN-BENFOTIAMINE-STABILIZED R-ALPHA LIPOIC ACID)    [provider]  lansoprazole (PREVACID) 30 MG capsule TAKE 1 CAPSULE BY MOUTH DAILY BEFORE BREAKFAST Patient not taking: Reported on 05/07/2018 02/26/18   Cammie Sickle, MD  levofloxacin (LEVAQUIN) 250 MG tablet On Day 1, take 2 tablets (500 mg) by  mouth once. On days 2-6, take 1 tablet (250 mg) by mouth once daily. 05/07/18   Verlon Au, NP  liraglutide (VICTOZA) 18 MG/3ML SOPN Inject 1.2 mg into the skin daily.    [provider]  lisinopril (PRINIVIL,ZESTRIL) 10 MG tablet Take 10 mg by mouth daily.    [provider]  magic mouthwash w/lidocaine SOLN Take 10 mLs by mouth 3 (three) times daily. Patient not taking: Reported on 05/07/2018 09/23/17   Jacquelin Hawking, NP  metoprolol succinate (TOPROL-XL) 25 MG 24 hr tablet Take 25 mg by mouth daily.    [provider]  Multiple Vitamins-Minerals (CENTRUM SILVER PO) Take 1 tablet by mouth daily.    [provider]  ondansetron (ZOFRAN) 8 MG tablet Take 8 mg by mouth every 8 (eight) hours as needed for nausea or refractory nausea / vomiting.  07/31/17   [provider]  polyethylene glycol powder (GLYCOLAX/MIRALAX) powder Take 17 g by mouth daily as needed (for constipation.).    [provider]  pravastatin (PRAVACHOL) 40 MG tablet Take 40 mg by mouth daily.    [provider]  predniSONE (DELTASONE) 20 MG tablet Take 1 pill a day; do not stop until further directed. Patient not taking: Reported on 05/07/2018 04/15/18   Cammie Sickle, MD  prochlorperazine (COMPAZINE) 10 MG tablet Take 10 mg by mouth every 8 (eight) hours as needed for vomiting.  07/31/17   [provider]    Allergies Sulfa antibiotics; Adhesive [tape]; Amoxicillin; and Other  Family History  Problem Relation Age of Onset  . Hypertension Mother   . Hypertension Father     Social History Social History   Tobacco Use  . Smoking status: Former Smoker    Types: Pipe    Last attempt to quit: 05/07/1974    Years since quitting: 44.0  . Smokeless tobacco: Never Used  . Tobacco comment: pt states he smoked a pipe a long time ago  Substance Use Topics  . Alcohol use: No  . Drug use: No    Review of Systems  Constitutional: Fever Eyes: No  visual changes. ENT: No sore throat. Cardiovascular: Denies chest pain. Respiratory: As above Gastrointestinal: No abdominal pain.  No nausea, no vomiting.  No diarrhea.  No constipation. Genitourinary: Negative for dysuria. Musculoskeletal: Negative for back pain. Skin: Negative for rash. Neurological: Negative for headaches, focal weakness or numbness.   ____________________________________________   PHYSICAL EXAM:  VITAL SIGNS: ED Triage Vitals  Enc Vitals Group     BP 05/07/18 2011 (!) 151/73     Pulse Rate 05/07/18 2011 (!) 120     Resp 05/07/18 2011 20     Temp 05/07/18 2011 (!) 102.7 F (39.3 C)     Temp Source 05/07/18 2011 Oral     SpO2 05/07/18 2011 91 %     Weight 05/07/18 2009 190 lb (86.2 kg)     Height 05/07/18 2009 5\' 10"  (1.778 m)     Head Circumference --      Peak Flow --      Pain Score 05/07/18 2009 4     Pain Loc --  Pain Edu? --      Excl. in [place]? --     Constitutional: Alert and oriented. Well appearing and in no acute distress. Eyes: Conjunctivae are normal.  Head: Atraumatic. Nose: No congestion/rhinnorhea. Mouth/Throat: Mucous membranes are moist.  Neck: No stridor.   Cardiovascular: tachycardic, regular rhythm. Grossly normal heart sounds.  Good peripheral circulation. Respiratory: Normal respiratory effort.  No retractions. Lungs CTAB. Gastrointestinal: Soft and nontender. No distention. No CVA tenderness. Musculoskeletal: No lower extremity tenderness nor edema.  No joint effusions. Neurologic:  Normal speech and language. No gross focal neurologic deficits are appreciated. Skin:  Skin is warm, dry and intact. No rash noted. Psychiatric: Mood and affect are normal. Speech and behavior are normal.  ____________________________________________   LABS (all labs ordered are listed, but only abnormal results are displayed)  Labs Reviewed  CULTURE, BLOOD (ROUTINE X 2)  CULTURE, BLOOD (ROUTINE X 2)  URINE CULTURE  COMPREHENSIVE  METABOLIC PANEL  LACTIC ACID, PLASMA  LACTIC ACID, PLASMA  CBC WITH DIFFERENTIAL/PLATELET  PROTIME-INR  URINALYSIS, COMPLETE (UACMP) WITH MICROSCOPIC   ____________________________________________  EKG  ED ECG REPORT I, Doran Stabler, the attending physician, personally viewed and interpreted this ECG.   Date: 05/07/2018  EKG Time: 2015  Rate: 122  Rhythm: sinus tachycardia  Axis: Normal  Intervals:none  ST&T Change: No ST segment elevation or depression.  No abnormal T wave inversion.  ____________________________________________  RADIOLOGY  Pending chest x-ray ____________________________________________   PROCEDURES  Procedure(s) performed:   .Critical Care Performed by: Orbie Pyo, MD Authorized by: Orbie Pyo, MD   Critical care provider statement:    Critical care time (minutes):  35   Critical care was necessary to treat or prevent imminent or life-threatening deterioration of the following conditions:  Sepsis   Critical care was time spent personally by me on the following activities:  Examination of patient, development of treatment plan with patient or surrogate, ordering and performing treatments and interventions, ordering and review of laboratory studies, ordering and review of radiographic studies and pulse oximetry    Critical Care performed:    ____________________________________________   INITIAL IMPRESSION / ASSESSMENT AND PLAN / ED COURSE  Pertinent labs & imaging results that were available during my care of the patient were reviewed by me and considered in my medical decision making (see chart for details).  DDX: Bacteremia, pneumonia, viral syndrome, sepsis, UTI As part of my medical decision making, I reviewed the following data within the electronic MEDICAL RECORD NUMBER Notes from prior ED visits  ----------------------------------------- 8:37 PM on  05/07/2018 -----------------------------------------  Patient showing signs of sepsis.  No obvious source at this time.  Patient does not of a port.  Will be admitted to the hospital with broad-spectrum antibiotics.  Signed out to Dr. Marcille Blanco.  Patient aware of diagnosis and treatment plan. ____________________________________________   FINAL CLINICAL IMPRESSION(S) / ED DIAGNOSES  Sepsis.    NEW MEDICATIONS STARTED DURING THIS VISIT:  New Prescriptions   No medications on file     Note:  This document was prepared using Dragon voice recognition software and may include unintentional dictation errors.     Orbie Pyo, MD 05/07/18 2037

## 2018-05-07 NOTE — Telephone Encounter (Signed)
Lauren called me. He will be started on antibiotics. And we will work him for possible infection tomorrow morning

## 2018-05-07 NOTE — Telephone Encounter (Signed)
Patient called to report that he has a temp of 102. 4 and that is after he took tylenol an hour ago. States he got chemotherapy today

## 2018-05-07 NOTE — Progress Notes (Signed)
Diarrhea has been better but still loose stool once a day / patient states he has been having a low grade temp.

## 2018-05-07 NOTE — Progress Notes (Signed)
ANTIBIOTIC CONSULT NOTE - INITIAL  Pharmacy Consult for Vancomycin, Aztreonam  Indication: sepsis  Allergies  Allergen Reactions  . Sulfa Antibiotics Swelling    Facial swelling No tongue or lips swelling, no difficulty breathing.  . Adhesive [Tape] Itching and Rash  . Amoxicillin Nausea Only and Other (See Comments)    Has patient had a PCN reaction causing immediate rash, facial/tongue/throat swelling, SOB or lightheadedness with hypotension: No Has patient had a PCN reaction causing severe rash involving mucus membranes or skin necrosis: No Has patient had a PCN reaction that required hospitalization: No Has patient had a PCN reaction occurring within the last 10 years: Yes If all of the above answers are "NO", then may proceed with Cephalosporin use.   . Other Rash    Surgical tape    Patient Measurements: Height: 5\' 10"  (177.8 cm) Weight: 190 lb (86.2 kg) IBW/kg (Calculated) : 73 Adjusted Body Weight: 78.3 kg   Vital Signs: Temp: 101.2 F (38.4 C) (06/13 2146) Temp Source: Oral (06/13 2146) BP: 152/78 (06/13 2100) Pulse Rate: 109 (06/13 2100) Intake/Output from previous day: No intake/output data recorded. Intake/Output from this shift: No intake/output data recorded.  Labs: Recent Labs    05/07/18 0845 05/07/18 2032  WBC 5.3 6.8  HGB 11.8* 11.7*  PLT 194 213  CREATININE 1.59* 1.56*   Estimated Creatinine Clearance: 43.5 mL/min (A) (by C-G formula based on SCr of 1.56 mg/dL (H)). No results for input(s): VANCOTROUGH, VANCOPEAK, VANCORANDOM, GENTTROUGH, GENTPEAK, GENTRANDOM, TOBRATROUGH, TOBRAPEAK, TOBRARND, AMIKACINPEAK, AMIKACINTROU, AMIKACIN in the last 72 hours.   Microbiology: No results found for this or any previous visit (from the past 720 hour(s)).  Medical History: Past Medical History:  Diagnosis Date  . Benign essential hypertension 05/31/2014  . Cancer ([place]) 06/2017   right lung   . Cerebral infarction ([place]) 05/31/2014  . Cerebrovascular  disease 05/31/2014  . CKD (chronic kidney disease) stage 3, GFR 30-59 ml/min (HCC) 02/23/2016  . CVA (cerebral infarction)   . Diabetes mellitus without complication ([place])   . Difficult intubation   . DVT (deep venous thrombosis) ([place])   . Esophageal reflux 05/31/2014  . H/O: CVA (cerebrovascular accident)   . Hyperlipidemia   . Hypertension   . ICAO (internal carotid artery occlusion) March 01, 2014   Left  . 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 Pleural effusion 04/23/2017  . Stroke [facility]) April, 4,2015  . Type 2 diabetes mellitus with peripheral neuropathy ([place]) 02/23/2016  . Weakness     Medications:   (Not in a hospital admission) Assessment: CrCl = 43.5 ml/min  Ke = 0.041 hr-1 T1/2 = 16.9 hrs VD = 36 L   Goal of Therapy:  Vancomycin trough level 15-20 mcg/ml  Plan:  Expected duration 7 days with resolution of temperature and/or normalization of WBC   Aztreonam 2 gm IV X 1 given in ED 6/13 @ 21:00. Aztreonam 2 gm IV Q8H ordered to continue on 6/14 @ 0500.   Vancomycin 750 mg IV X 1 on 6/13 @ 23:00. Vancomycin 750 mg IV Q18H 6/14 @ 0500 ,  6 hrs after 1st dose (stacked dosing). This pt will reach Css by 6/17 @ 11:00. Will draw 1st trough on 6/17 @ 0430, which will be very close to Css.   Zamira Hickam D 05/07/2018,10:00 PM

## 2018-05-08 ENCOUNTER — Inpatient Hospital Stay: Payer: Medicare Other

## 2018-05-08 ENCOUNTER — Inpatient Hospital Stay: Payer: Medicare Other | Admitting: Oncology

## 2018-05-08 LAB — URINALYSIS, COMPLETE (UACMP) WITH MICROSCOPIC
Bacteria, UA: NONE SEEN
Bilirubin Urine: NEGATIVE
Glucose, UA: 50 mg/dL — AB
Hgb urine dipstick: NEGATIVE
Ketones, ur: 20 mg/dL — AB
Leukocytes, UA: NEGATIVE
Nitrite: NEGATIVE
Protein, ur: 100 mg/dL — AB
Specific Gravity, Urine: 1.023 (ref 1.005–1.030)
pH: 5 (ref 5.0–8.0)

## 2018-05-08 LAB — GLUCOSE, CAPILLARY
Glucose-Capillary: 100 mg/dL — ABNORMAL HIGH (ref 65–99)
Glucose-Capillary: 138 mg/dL — ABNORMAL HIGH (ref 65–99)

## 2018-05-08 LAB — HEMOGLOBIN A1C
Hgb A1c MFr Bld: 8.9 % — ABNORMAL HIGH (ref 4.8–5.6)
Mean Plasma Glucose: 208.73 mg/dL

## 2018-05-08 LAB — MRSA PCR SCREENING: MRSA by PCR: NEGATIVE

## 2018-05-08 MED ORDER — ADULT MULTIVITAMIN W/MINERALS CH
1.0000 | ORAL_TABLET | Freq: Every day | ORAL | Status: DC
  Administered 2018-05-08 – 2018-05-11 (×4): 1 via ORAL
  Filled 2018-05-08 (×4): qty 1

## 2018-05-08 MED ORDER — POLYETHYLENE GLYCOL 3350 17 G PO PACK: 17.0000 g | PACK | Freq: Every day | ORAL | Status: DC | PRN

## 2018-05-08 MED ORDER — PRAVASTATIN SODIUM 20 MG PO TABS
40.0000 mg | ORAL_TABLET | Freq: Every day | ORAL | Status: DC
  Administered 2018-05-08 – 2018-05-12 (×5): 40 mg via ORAL
  Filled 2018-05-08 (×5): qty 2

## 2018-05-08 MED ORDER — ASPIRIN EC 81 MG PO TBEC
81.0000 mg | DELAYED_RELEASE_TABLET | Freq: Every day | ORAL | Status: DC
  Administered 2018-05-08 – 2018-05-12 (×5): 81 mg via ORAL
  Filled 2018-05-08 (×5): qty 1

## 2018-05-08 MED ORDER — FLUTICASONE PROPIONATE 50 MCG/ACT NA SUSP
1.0000 | Freq: Every day | NASAL | Status: DC
  Administered 2018-05-09 – 2018-05-12 (×4): 1 via NASAL
  Filled 2018-05-08: qty 16

## 2018-05-08 MED ORDER — POLYETHYLENE GLYCOL 3350 17 GM/SCOOP PO POWD
17.0000 g | Freq: Every day | ORAL | Status: DC | PRN
Start: 2018-05-08 — End: 2018-05-08

## 2018-05-08 MED ORDER — INSULIN ASPART 100 UNIT/ML ~~LOC~~ SOLN
0.0000 [IU] | Freq: Three times a day (TID) | SUBCUTANEOUS | Status: DC
  Administered 2018-05-09 – 2018-05-10 (×3): 2 [IU] via SUBCUTANEOUS
  Administered 2018-05-10: 3 [IU] via SUBCUTANEOUS
  Administered 2018-05-11: 09:00:00 2 [IU] via SUBCUTANEOUS
  Administered 2018-05-11: 12:00:00 3 [IU] via SUBCUTANEOUS
  Administered 2018-05-12: 13:00:00 2 [IU] via SUBCUTANEOUS
  Filled 2018-05-08 (×7): qty 1

## 2018-05-08 MED ORDER — CLOPIDOGREL BISULFATE 75 MG PO TABS
75.0000 mg | ORAL_TABLET | Freq: Every day | ORAL | Status: DC
  Administered 2018-05-08 – 2018-05-12 (×5): 75 mg via ORAL
  Filled 2018-05-08 (×5): qty 1

## 2018-05-08 MED ORDER — INSULIN ASPART 100 UNIT/ML ~~LOC~~ SOLN: 0.0000 [IU] | Freq: Every day | SUBCUTANEOUS | Status: DC

## 2018-05-08 MED ORDER — LISINOPRIL 10 MG PO TABS
10.0000 mg | ORAL_TABLET | Freq: Every day | ORAL | Status: DC
  Administered 2018-05-08 – 2018-05-12 (×5): 10 mg via ORAL
  Filled 2018-05-08 (×5): qty 1

## 2018-05-08 MED ORDER — LEVOFLOXACIN IN D5W 750 MG/150ML IV SOLN
750.0000 mg | INTRAVENOUS | Status: DC
  Administered 2018-05-09: 22:00:00 750 mg via INTRAVENOUS
  Filled 2018-05-08 (×2): qty 150

## 2018-05-08 MED ORDER — METOPROLOL SUCCINATE ER 25 MG PO TB24
25.0000 mg | ORAL_TABLET | Freq: Every day | ORAL | Status: DC
Start: 2018-05-08 — End: 2018-05-12
  Administered 2018-05-08 – 2018-05-12 (×5): 25 mg via ORAL
  Filled 2018-05-08 (×5): qty 1

## 2018-05-08 MED ORDER — AMLODIPINE BESYLATE 5 MG PO TABS
5.0000 mg | ORAL_TABLET | Freq: Every day | ORAL | Status: DC
  Administered 2018-05-08 – 2018-05-12 (×5): 5 mg via ORAL
  Filled 2018-05-08 (×5): qty 1

## 2018-05-08 NOTE — Progress Notes (Signed)
Inpatient Diabetes Program Recommendations  AACE/ADA: New Consensus Statement on Inpatient Glycemic Control (2019)  Target Ranges:  Prepandial:   less than 140 mg/dL      Peak postprandial:   less than 180 mg/dL (1-2 hours)      Critically ill patients:  140 - 180 mg/dL   Results for HELIX, LAFONTAINE (MRN [id number]) as of 05/08/2018 10:00  Ref. Range 05/07/2018 22:55  Glucose-Capillary Latest Ref Range: 65 - 99 mg/dL 218 (H)   Results for BRIANT, ANGELILLO (MRN [id number]) as of 05/08/2018 10:00  Ref. Range 05/07/2018 08:45 05/07/2018 20:32  Glucose Latest Ref Range: 65 - 99 mg/dL 191 (H) 199 (H)   Review of Glycemic Control  Diabetes history: DM2 Outpatient Diabetes medications: Victoza 1.2 mg daily Current orders for Inpatient glycemic control: None  Inpatient Diabetes Program Recommendations: Correction (SSI): Please consider ordering Novolog 0-15 units TID with meals and Novolog 0-5 units QHS.  Thanks, Barnie Alderman, RN, MSN, CDE Diabetes Coordinator Inpatient Diabetes Program [phone] (Team Pager from 8am to 5pm)

## 2018-05-08 NOTE — Progress Notes (Signed)
Initial Nutrition Assessment  DOCUMENTATION CODES:   Non-severe (moderate) malnutrition in context of chronic illness  INTERVENTION:   Snacks between meals ordered for patient  MVI  Liberalize diet  Reviewed menu with patient  NUTRITION DIAGNOSIS:   Moderate Malnutrition related to chronic illness(cancer) as evidenced by moderate muscle depletion, percent weight loss.  GOAL:   Patient will meet greater than or equal to 90% of their needs  MONITOR:   PO intake, Weight trends, Supplement acceptance, I & O's  REASON FOR ASSESSMENT:   Malnutrition Screening Tool    ASSESSMENT:   Patient with PMH mesothelioma currently on chemo, CKD III, HTN and Diabetes, presents with acute sepsis.   Spoke with patient and his wife at bedside. He had [place], mashed potatoes, and pinto beans for lunch, meal completion documented at 75%. States he normally has a good appetite. Usual PO intake consists of scrambled eggs, bacon or sausage, fruit, and toast for breakfast. A sandwich or soup, or both for lunch, or a cooked meal like roast beef or meat loaf with a starch and vegetable for dinner. Also eats small snacks between meals, may eat oatmeal or cereal before bed. Drinks a glucerna mixed with milk every 2-3 days.  Reports a UBW of around 230 pounds 1 year ago with a 40 pound weight loss since beginning chemo around that time. However, per chart his weight fluctuated between 200-205 pounds up until 02/27/2018. He now exhibits a 15 pound/7.3% severe weight loss over the past 2.25 months. Also reports dropping 4 pants sizes. Patient states Beryle Flock may have caused diarrhea. Reports having 1 watery BM daily. Tries to maintain fluid status by drinking water constantly.Also complains of taste alterations due to being on prednisone, but this does not seem to decrease his PO intake.  Patient was open to snacks during admission, ordered yogurt, [place] sandwich, and oatmeal. Also encouraged him to order  sugar free carnation instant breakfast with meal trays as desired.  Labs reviewed:  K+ 3.4, BUN/Cr 23/1.56  Medications reviewed and include:  Colace, Insulin Gemcitabine Infliximab NS at 11mL/hr  NUTRITION - FOCUSED PHYSICAL EXAM:    Most Recent Value  Orbital Region  Mild depletion  Upper Arm Region  No depletion  Thoracic and Lumbar Region  No depletion  Buccal Region  No depletion  Temple Region  No depletion  Clavicle Bone Region  No depletion  Clavicle and Acromion Bone Region  No depletion  Scapular Bone Region  No depletion  Dorsal Hand  No depletion  Patellar Region  Moderate depletion  Anterior Thigh Region  Moderate depletion  Posterior Calf Region  Moderate depletion       Diet Order:   Diet Order           Diet Carb Modified Fluid consistency: Thin; Room service appropriate? Yes  Diet effective now          EDUCATION NEEDS:   No education needs have been identified at this time  Skin:  Skin Assessment: Reviewed RN Assessment(abrasion to R Knee)  Last BM:  05/08/2018  Height:   Ht Readings from Last 1 Encounters:  05/07/18 5\' 10"  (1.778 m)    Weight:   Wt Readings from Last 1 Encounters:  05/07/18 189 lb 9 oz (86 kg)    Ideal Body Weight:  75.45 kg  BMI:  Body mass index is 27.2 kg/m.  Estimated Nutritional Needs:   Kcal:  8315-1761 (MSJ x1.4-1.6)  Protein:  112-137 grams (1.3-1.6g/kg)  Fluid:  >2L  Satira Anis. Melek Pownall, MS, RD LDN Inpatient Clinical Dietitian Pager [phone]

## 2018-05-08 NOTE — Progress Notes (Signed)
[place] [facility] [place] NAME: Ian Shaffer    MR#:  [id number]  DATE OF BIRTH:  1946-03-30  SUBJECTIVE:  CHIEF COMPLAINT:   Chief Complaint  Patient presents with  . Fever  Patient feeling better today with no complaints, no events over the night  REVIEW OF SYSTEMS:  CONSTITUTIONAL: No fever, fatigue or weakness.  EYES: No blurred or double vision.  EARS, NOSE, AND THROAT: No tinnitus or ear pain.  RESPIRATORY: No cough, shortness of breath, wheezing or hemoptysis.  CARDIOVASCULAR: No chest pain, orthopnea, edema.  GASTROINTESTINAL: No nausea, vomiting, diarrhea or abdominal pain.  GENITOURINARY: No dysuria, hematuria.  ENDOCRINE: No polyuria, nocturia,  HEMATOLOGY: No anemia, easy bruising or bleeding SKIN: No rash or lesion. MUSCULOSKELETAL: No joint pain or arthritis.   NEUROLOGIC: No tingling, numbness, weakness.  PSYCHIATRY: No anxiety or depression.   ROS  DRUG ALLERGIES:   Allergies  Allergen Reactions  . Sulfa Antibiotics Swelling    Facial swelling No tongue or lips swelling, no difficulty breathing.  . Adhesive [Tape] Itching and Rash  . Amoxicillin Nausea Only and Other (See Comments)    Has patient had a PCN reaction causing immediate rash, facial/tongue/throat swelling, SOB or lightheadedness with hypotension: No Has patient had a PCN reaction causing severe rash involving mucus membranes or skin necrosis: No Has patient had a PCN reaction that required hospitalization: No Has patient had a PCN reaction occurring within the last 10 years: Yes If all of the above answers are "NO", then may proceed with Cephalosporin use.   . Other Rash    Surgical tape    VITALS:  Blood pressure 127/74, pulse 86, temperature 98.9 F (37.2 C), temperature source Oral, resp. rate 18, height 5\' 10"  (1.778 m), weight 86 kg (189 lb 9 oz), SpO2 94 %.  PHYSICAL EXAMINATION:  GENERAL:  72 y.o.-year-old patient lying in the bed with no  acute distress.  EYES: Pupils equal, round, reactive to light and accommodation. No scleral icterus. Extraocular muscles intact.  HEENT: Head atraumatic, normocephalic. Oropharynx and nasopharynx clear.  NECK:  Supple, no jugular venous distention. No thyroid enlargement, no tenderness.  LUNGS: Normal breath sounds bilaterally, no wheezing, rales,rhonchi or crepitation. No use of accessory muscles of respiration.  CARDIOVASCULAR: S1, S2 normal. No murmurs, rubs, or gallops.  ABDOMEN: Soft, nontender, nondistended. Bowel sounds present. No organomegaly or mass.  EXTREMITIES: No pedal edema, cyanosis, or clubbing.  NEUROLOGIC: Cranial nerves II through XII are intact. Muscle strength 5/5 in all extremities. Sensation intact. Gait not checked.  PSYCHIATRIC: The patient is alert and oriented x 3.  SKIN: No obvious rash, lesion, or ulcer.   Physical Exam LABORATORY PANEL:   CBC Recent Labs  Lab 05/07/18 2032  WBC 6.8  HGB 11.7*  HCT 33.5*  PLT 213   ------------------------------------------------------------------------------------------------------------------  Chemistries  Recent Labs  Lab 05/07/18 2032  NA 137  K 3.4*  CL 105  CO2 18*  GLUCOSE 199*  BUN 23*  CREATININE 1.56*  CALCIUM 8.4*  AST 39  ALT 33  ALKPHOS 110  BILITOT 1.0   ------------------------------------------------------------------------------------------------------------------  Cardiac Enzymes No results for input(s): TROPONINI in the last 168 hours. ------------------------------------------------------------------------------------------------------------------  RADIOLOGY:  Dg Chest Port 1 View  Result Date: 05/07/2018 CLINICAL DATA:  Fever and shortness of breath EXAM: PORTABLE CHEST 1 VIEW COMPARISON:  September 08, 2017 FINDINGS: The heart size and mediastinal contours are within normal limits. Both lungs are clear. The visualized skeletal structures are  unremarkable. IMPRESSION: No active  disease. Electronically Signed   By: Dorise Bullion III M.D   On: 05/07/2018 21:17    ASSESSMENT AND PLAN:  This is a 72 year old male admitted for sepsis  *Acute sepsis Resolving Etiology unknown Exacerbated by immunosuppression given lung cancer on chemo Continue sepsis protocol, empiric aztreonam/Levaquin/vancomycin, follow-up on cultures  *Chronic mesothelioma on chemo Diagnosed August 2018 Oncology to see for continuity of care  *Acute dehydration Resolving with IV fluids  *Chronic CAD Stable Continue aspirin, Plavix, and statin therapy  *Chronic benign essential hypertension Stable on amlodipine, lisinopril and metoprolol  *Chronic Diabetes mellitus type 2 Continue sliding scale insulin with Accu-Cheks per routine Continue to hold Victoza  *Chronic Hyperlipidemia Stable  continue statin therapy  *CKD, III Continue to avoid nephrotoxic agents IV fluids for rehydration   Disposition Home in 2 to 3 days barring any complications  All the records are reviewed and case discussed with Care Management/Social Workerr. Management plans discussed with the patient, family and they are in agreement.  CODE STATUS: full  TOTAL TIME TAKING CARE OF THIS PATIENT: 35 minutes.     POSSIBLE D/C IN 1-3 DAYS, DEPENDING ON CLINICAL CONDITION.   Ian Shaffer M.D on 05/08/2018   Between 7am to 6pm - Pager - [phone]  After 6pm go to [email or web address] - password [email or web address] [place] [facility]  Office  [phone]  CC: Primary care physician; Derinda Late, MD  Note: This dictation was prepared with Dragon dictation along with smaller phrase technology. Any transcriptional errors that result from this process are unintentional.

## 2018-05-08 NOTE — H&P (Signed)
Ian Shaffer is an 72 y.o. male.   Chief Complaint: Fever HPI: The patient with past medical history of mesothelioma currently undergoing chemotherapy, history of CVA, hypertension, CKD and diabetes presents to the emergency department with fever.  Patient states that his temperature was 104 F orally at home.  He denies nausea but admits to general malaise.  Tylenol did not immediately help bring his fever down.  In the emergency department chest x-ray was clear and urine was normal.  Due to immunocompromise and signs and symptoms of sepsis the emergency department staff call hospitalist service for admission.  Past Medical History:  Diagnosis Date  . Benign essential hypertension 05/31/2014  . Cancer ([place]) 06/2017   right lung   . Cerebral infarction ([place]) 05/31/2014  . Cerebrovascular disease 05/31/2014  . CKD (chronic kidney disease) stage 3, GFR 30-59 ml/min (HCC) 02/23/2016  . CVA (cerebral infarction)   . Diabetes mellitus without complication ([place])   . Difficult intubation   . DVT (deep venous thrombosis) ([place])   . Esophageal reflux 05/31/2014  . H/O: CVA (cerebrovascular accident)   . Hyperlipidemia   . Hypertension   . ICAO (internal carotid artery occlusion) March 01, 2014   Left  . 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 Pleural effusion 04/23/2017  . Stroke [facility]) April, 4,2015  . Type 2 diabetes mellitus with peripheral neuropathy ([place]) 02/23/2016  . Weakness     Past Surgical History:  Procedure Laterality Date  . CHEST TUBE INSERTION N/A 06/25/2017   Procedure: VANVBT CATHETER INSERTION;  Surgeon: Nestor Lewandowsky, MD;  Location: [facility] ORS;  Service: Thoracic;  Laterality: N/A;  . CHEST TUBE INSERTION Right 08/07/2017   Procedure: REMOVAL OF PLEUR X CATHETER;  Surgeon: Nestor Lewandowsky, MD;  Location: [facility] ORS;  Service: General;  Laterality: Right;  . NASAL SINUS SURGERY  2000  . TONSILLECTOMY    . VIDEO ASSISTED THORACOSCOPY Right 06/25/2017   Procedure:  VIDEO ASSISTED THORACOSCOPY WITH TALC PLEURODESIS, CHEST TUBE INSERTION;  Surgeon: Nestor Lewandowsky, MD;  Location: [facility] ORS;  Service: Thoracic;  Laterality: Right;    Family History  Problem Relation Age of Onset  . Hypertension Mother   . Hypertension Father    Social History:  reports that he quit smoking about 44 years ago. His smoking use included pipe. He has never used smokeless tobacco. He reports that he does not drink alcohol or use drugs.  Allergies:  Allergies  Allergen Reactions  . Sulfa Antibiotics Swelling    Facial swelling No tongue or lips swelling, no difficulty breathing.  . Adhesive [Tape] Itching and Rash  . Amoxicillin Nausea Only and Other (See Comments)    Has patient had a PCN reaction causing immediate rash, facial/tongue/throat swelling, SOB or lightheadedness with hypotension: No Has patient had a PCN reaction causing severe rash involving mucus membranes or skin necrosis: No Has patient had a PCN reaction that required hospitalization: No Has patient had a PCN reaction occurring within the last 10 years: Yes If all of the above answers are "NO", then may proceed with Cephalosporin use.   . Other Rash    Surgical tape    Medications Prior to Admission  Medication Sig Dispense Refill  . amLODipine (NORVASC) 5 MG tablet Take 5 mg by mouth daily.    Marland Kitchen aspirin EC 81 MG tablet Take 81 mg by mouth daily.    . clopidogrel (PLAVIX) 75 MG tablet Take 75 mg by mouth daily.    . fluticasone (  FLONASE) 50 MCG/ACT nasal spray Place 1 spray into both nostrils daily at 2 PM.     . levofloxacin (LEVAQUIN) 250 MG tablet On Day 1, take 2 tablets (500 mg) by mouth once. On days 2-6, take 1 tablet (250 mg) by mouth once daily. 8 tablet 0  . liraglutide (VICTOZA) 18 MG/3ML SOPN Inject 1.2 mg into the skin daily.    Marland Kitchen lisinopril (PRINIVIL,ZESTRIL) 10 MG tablet Take 10 mg by mouth daily.    . metoprolol succinate (TOPROL-XL) 25 MG 24 hr tablet Take 25 mg by mouth daily.    .  Multiple Vitamins-Minerals (CENTRUM SILVER PO) Take 1 tablet by mouth daily.    . ondansetron (ZOFRAN) 8 MG tablet Take 8 mg by mouth every 8 (eight) hours as needed for nausea or refractory nausea / vomiting.     . polyethylene glycol powder (GLYCOLAX/MIRALAX) powder Take 17 g by mouth daily as needed (for constipation.).    Marland Kitchen pravastatin (PRAVACHOL) 40 MG tablet Take 40 mg by mouth daily.    . prochlorperazine (COMPAZINE) 10 MG tablet Take 10 mg by mouth every 8 (eight) hours as needed for vomiting.     . Homeopathic Products (NERVE PAIN RELIEF SL) Take 1 tablet by mouth daily. NERVE RENEW (METHYLCOBALAMIN-BENFOTIAMINE-STABILIZED R-ALPHA LIPOIC ACID)    . lansoprazole (PREVACID) 30 MG capsule TAKE 1 CAPSULE BY MOUTH DAILY BEFORE BREAKFAST (Patient not taking: Reported on 05/07/2018) 30 capsule 4  . magic mouthwash w/lidocaine SOLN Take 10 mLs by mouth 3 (three) times daily. (Patient not taking: Reported on 05/07/2018) 300 mL 0  . predniSONE (DELTASONE) 20 MG tablet Take 1 pill a day; do not stop until further directed. (Patient not taking: Reported on 05/07/2018) 40 tablet 0    Results for orders placed or performed during the hospital encounter of 05/07/18 (from the past 48 hour(s))  Comprehensive metabolic panel     Status: Abnormal   Collection Time: 05/07/18  8:32 PM  Result Value Ref Range   Sodium 137 135 - 145 mmol/L   Potassium 3.4 (L) 3.5 - 5.1 mmol/L   Chloride 105 101 - 111 mmol/L   CO2 18 (L) 22 - 32 mmol/L   Glucose, Bld 199 (H) 65 - 99 mg/dL   BUN 23 (H) 6 - 20 mg/dL   Creatinine, Ser 1.56 (H) 0.61 - 1.24 mg/dL   Calcium 8.4 (L) 8.9 - 10.3 mg/dL   Total Protein 6.9 6.5 - 8.1 g/dL   Albumin 3.4 (L) 3.5 - 5.0 g/dL   AST 39 15 - 41 U/L   ALT 33 17 - 63 U/L   Alkaline Phosphatase 110 38 - 126 U/L   Total Bilirubin 1.0 0.3 - 1.2 mg/dL   GFR calc non Af Amer 42 (L) >60 mL/min   GFR calc Af Amer 49 (L) >60 mL/min    Comment: (NOTE) The eGFR has been calculated using the CKD EPI  equation. This calculation has not been validated in all clinical situations. eGFR's persistently <60 mL/min signify possible Chronic Kidney Disease.    Anion gap 14 5 - 15    Comment: Performed at [facility], [address] [place], [place] [address]  Lactic acid, plasma     Status: None   Collection Time: 05/07/18  8:32 PM  Result Value Ref Range   Lactic Acid, Venous 1.1 0.5 - 1.9 mmol/L    Comment: Performed at [facility], [address] [place], [place] [address]  CBC with Differential  Status: Abnormal   Collection Time: 05/07/18  8:32 PM  Result Value Ref Range   WBC 6.8 3.8 - 10.6 K/uL   RBC 3.81 (L) 4.40 - 5.90 MIL/uL   Hemoglobin 11.7 (L) 13.0 - 18.0 g/dL   HCT 33.5 (L) 40.0 - 52.0 %   MCV 87.9 80.0 - 100.0 fL   MCH 30.7 26.0 - 34.0 pg   MCHC 35.0 32.0 - 36.0 g/dL   RDW 13.5 11.5 - 14.5 %   Platelets 213 150 - 440 K/uL   Neutrophils Relative % 86 %   Neutro Abs 5.9 1.4 - 6.5 K/uL   Lymphocytes Relative 5 %   Lymphs Abs 0.4 (L) 1.0 - 3.6 K/uL   Monocytes Relative 7 %   Monocytes Absolute 0.5 0.2 - 1.0 K/uL   Eosinophils Relative 1 %   Eosinophils Absolute 0.0 0 - 0.7 K/uL   Basophils Relative 1 %   Basophils Absolute 0.1 0 - 0.1 K/uL    Comment: Performed at [facility], [address] [place], [place] [address]  Protime-INR     Status: None   Collection Time: 05/07/18  8:32 PM  Result Value Ref Range   Prothrombin Time 15.2 11.4 - 15.2 seconds   INR 1.21     Comment: Performed at [facility], [address] [place], [place] [address]  Glucose, capillary     Status: Abnormal   Collection Time: 05/07/18 10:55 PM  Result Value Ref Range   Glucose-Capillary 218 (H) 65 - 99 mg/dL  Lactic acid, plasma     Status: None   Collection Time: 05/07/18 11:02 PM  Result Value Ref Range   Lactic Acid, Venous 0.9 0.5 - 1.9 mmol/L    Comment: Performed at [facility], [address] [place], [place] [address]   TSH     Status: None   Collection Time: 05/07/18 11:02 PM  Result Value Ref Range   TSH 2.511 0.350 - 4.500 uIU/mL    Comment: Performed by a 3rd Generation assay with a functional sensitivity of <=0.01 uIU/mL. Performed at [facility], [address] [place], [place] [address]   MRSA PCR Screening     Status: None   Collection Time: 05/07/18 11:50 PM  Result Value Ref Range   MRSA by PCR NEGATIVE NEGATIVE    Comment:        The GeneXpert MRSA Assay (FDA approved for NASAL specimens only), is one component of a comprehensive MRSA colonization surveillance program. It is not intended to diagnose MRSA infection nor to guide or monitor treatment for MRSA infections. Performed at [facility], [address] [place], [place] [address]   Urinalysis, Complete w Microscopic     Status: Abnormal   Collection Time: 05/08/18 12:09 AM  Result Value Ref Range   Color, Urine YELLOW (A) YELLOW   APPearance HAZY (A) CLEAR   Specific Gravity, Urine 1.023 1.005 - 1.030   pH 5.0 5.0 - 8.0   Glucose, UA 50 (A) NEGATIVE mg/dL   Hgb urine dipstick NEGATIVE NEGATIVE   Bilirubin Urine NEGATIVE NEGATIVE   Ketones, ur 20 (A) NEGATIVE mg/dL   Protein, ur 100 (A) NEGATIVE mg/dL   Nitrite NEGATIVE NEGATIVE   Leukocytes, UA NEGATIVE NEGATIVE   RBC / HPF 0-5 0 - 5 RBC/hpf   WBC, UA 0-5 0 - 5 WBC/hpf   Bacteria, UA NONE SEEN NONE SEEN   Squamous Epithelial / LPF 0-5 0 - 5  Mucus PRESENT     Comment: Performed at [facility], [address] [place], [place] [address]   Ct Chest Wo Contrast  Result Date: 05/06/2018 CLINICAL DATA:  72 year old male with history of metastatic unresectable mesothelioma. Follow-up study. EXAM: CT CHEST WITHOUT CONTRAST TECHNIQUE: Multidetector CT imaging of the chest was performed following the standard protocol without IV contrast. COMPARISON:  Chest CT 02/07/2018. FINDINGS: Cardiovascular: Heart size is normal. There is no significant  pericardial fluid, thickening or pericardial calcification. There is aortic atherosclerosis, as well as atherosclerosis of the great vessels of the mediastinum and the coronary arteries, including calcified atherosclerotic plaque in the left anterior descending, left circumflex and right coronary arteries. Mediastinum/Nodes: No pathologically enlarged mediastinal or hilar lymph nodes. Please note that accurate exclusion of hilar adenopathy is limited on noncontrast CT scans. Esophagus is unremarkable in appearance. No axillary lymphadenopathy. Lungs/Pleura: Previously noted pleural-based mass in the inferior aspect of the right hemithorax appears grossly less bulky than the prior study measuring approximately 4.9 x 2.7 cm on today's examination (axial image 111 of series 2). Some adjacent pleural thickening and trace volume of right-sided pleural fluid. Some other calcified pleural plaques are noted in the right hemithorax. Previously noted left lower lobe pulmonary nodule has significantly increased in size, currently measuring 15 mm in diameter (axial image 77 of series 3) as compared with 6 mm on prior study from 01/28/2018. Likewise, a right middle lobe nodule which was previously only 2 mm in size currently measures 10 x 8 mm (axial image 99 of series 3). 7 mm nodule in the periphery of the right upper lobe (axial image 68 of series 3) increased from 2 mm on the prior examination. Several other smaller 2-3 mm pulmonary nodules are noted in the lungs, stable compared to prior examinations and nonspecific but likely benign. No consolidative airspace disease. Upper Abdomen: Aortic atherosclerosis. Extension of right pleural base mass into right lobe of the liver is again noted, with clear progression compared to the prior examination. The area of hepatic involvement now encompassing approximately 7.9 x 11.2 cm (axial image 123 of series 2) involving portions of segments 6, 7 and 8. Musculoskeletal: There are no  aggressive appearing lytic or blastic lesions noted in the visualized portions of the skeleton. IMPRESSION: 1. Mixed response to therapy. Specifically, while the primary lesion in the base of the right hemithorax has decreased in size, direct extension into the right lobe of the liver, as well as developing multifocal pulmonary metastases is noted on today's examination. 2. Aortic atherosclerosis, in addition to three-vessel coronary artery disease. Assessment for potential risk factor modification, dietary therapy or pharmacologic therapy may be warranted, if clinically indicated. Aortic Atherosclerosis (ICD10-I70.0). Electronically Signed   By: Vinnie Langton M.D.   On: 05/06/2018 13:20   Dg Chest Port 1 View  Result Date: 05/07/2018 CLINICAL DATA:  Fever and shortness of breath EXAM: PORTABLE CHEST 1 VIEW COMPARISON:  September 08, 2017 FINDINGS: The heart size and mediastinal contours are within normal limits. Both lungs are clear. The visualized skeletal structures are unremarkable. IMPRESSION: No active disease. Electronically Signed   By: Dorise Bullion III M.D   On: 05/07/2018 21:17    Review of Systems  Constitutional: Positive for fever. Negative for chills.  HENT: Negative for sore throat and tinnitus.   Eyes: Negative for blurred vision and redness.  Respiratory: Negative for cough and shortness of breath.   Cardiovascular: Negative for chest pain, palpitations, orthopnea and PND.  Gastrointestinal:  Negative for abdominal pain, diarrhea, nausea and vomiting.  Genitourinary: Negative for dysuria, frequency and urgency.  Musculoskeletal: Negative for joint pain and myalgias.  Skin: Negative for rash.       No lesions  Neurological: Negative for speech change, focal weakness and weakness.  Endo/Heme/Allergies: Does not bruise/bleed easily.       No temperature intolerance  Psychiatric/Behavioral: Negative for depression and suicidal ideas.    Blood pressure 137/69, pulse 87,  temperature 98.3 F (36.8 C), temperature source Oral, resp. rate 18, height 5' 10"  (1.778 m), weight 86 kg (189 lb 9 oz), SpO2 96 %. Physical Exam  Vitals reviewed. Constitutional: He is oriented to person, place, and time. He appears well-developed and well-nourished. No distress.  HENT:  Head: Normocephalic and atraumatic.  Mouth/Throat: Oropharynx is clear and moist.  Eyes: Pupils are equal, round, and reactive to light. Conjunctivae and EOM are normal. No scleral icterus.  Neck: Normal range of motion. Neck supple. No JVD present. No tracheal deviation present. No thyromegaly present.  Cardiovascular: Normal rate, regular rhythm and normal heart sounds. Exam reveals no gallop and no friction rub.  No murmur heard. Respiratory: Effort normal and breath sounds normal. No respiratory distress. He has no wheezes.  GI: Soft. Bowel sounds are normal. He exhibits no distension. There is no tenderness.  Genitourinary:  Genitourinary Comments: Deferred  Musculoskeletal: Normal range of motion. He exhibits no edema.  Lymphadenopathy:    He has no cervical adenopathy.  Neurological: He is alert and oriented to person, place, and time. No cranial nerve deficit.  Skin: Skin is warm and dry. No rash noted. No erythema.  Psychiatric: He has a normal mood and affect. His behavior is normal. Judgment and thought content normal.     Assessment/Plan This is a 72 year old male admitted for sepsis. 1.  Sepsis: The patient meets criteria via fever and tachycardia.  He is hemodynamically stable.  Continue broad-spectrum antibiotics.  Follow blood cultures for growth and sensitivities and narrow antibiotics appropriately.  No source at this time 2.  CAD: Stable; continue aspirin and Plavix. 3.  Hypertension: Controlled; continue amlodipine, lisinopril and metoprolol 4.  Diabetes mellitus type 2: Continue sliding scale insulin.  Hold Victoza. 5.  Hyperlipidemia: Continue statin therapy 6.  CKD: Stage III;  avoid nephrotoxic agents.  Hydrate with intravenous fluid. 7.  Lung cancer: [name]: Consult oncology 8.  DVT prophylaxis: Lovenox 9.  GI prophylaxis: None The patient is a full code.  Time spent on admission orders and patient care approximately 45 minutes  Harrie Foreman, MD 05/08/2018, 2:34 AM

## 2018-05-09 ENCOUNTER — Inpatient Hospital Stay
Admit: 2018-05-09 | Discharge: 2018-05-09 | Disposition: A | Discharge status: Home / Self Care | Payer: Medicare Other | Attending: Family Medicine | Admitting: Family Medicine

## 2018-05-09 DIAGNOSIS — Z882 Allergy status to sulfonamides status: Secondary | ICD-10-CM

## 2018-05-09 DIAGNOSIS — E440 Moderate protein-calorie malnutrition: Secondary | ICD-10-CM

## 2018-05-09 DIAGNOSIS — Z87891 Personal history of nicotine dependence: Secondary | ICD-10-CM

## 2018-05-09 DIAGNOSIS — Z9109 Other allergy status, other than to drugs and biological substances: Secondary | ICD-10-CM

## 2018-05-09 DIAGNOSIS — R5081 Fever presenting with conditions classified elsewhere: Secondary | ICD-10-CM

## 2018-05-09 DIAGNOSIS — N189 Chronic kidney disease, unspecified: Secondary | ICD-10-CM

## 2018-05-09 DIAGNOSIS — C459 Mesothelioma, unspecified: Secondary | ICD-10-CM

## 2018-05-09 DIAGNOSIS — Z881 Allergy status to other antibiotic agents status: Secondary | ICD-10-CM

## 2018-05-09 LAB — GLUCOSE, CAPILLARY
Glucose-Capillary: 125 mg/dL — ABNORMAL HIGH (ref 65–99)
Glucose-Capillary: 122 mg/dL — ABNORMAL HIGH (ref 65–99)
Glucose-Capillary: 103 mg/dL — ABNORMAL HIGH (ref 65–99)
Glucose-Capillary: 140 mg/dL — ABNORMAL HIGH (ref 65–99)

## 2018-05-09 LAB — URINE CULTURE: Culture: NO GROWTH

## 2018-05-09 MED ORDER — SALINE SPRAY 0.65 % NA SOLN
1.0000 | NASAL | Status: DC | PRN
  Filled 2018-05-09: qty 44

## 2018-05-09 NOTE — Progress Notes (Signed)
*  PRELIMINARY RESULTS* Echocardiogram 2D Echocardiogram has been performed.  Ian Shaffer Ian Shaffer 05/09/2018, 2:44 PM

## 2018-05-09 NOTE — Plan of Care (Signed)
  Problem: Education: Goal: Knowledge of General Education information will improve Outcome: Progressing   Problem: Clinical Measurements: Goal: Will remain free from infection Outcome: Progressing   Problem: Activity: Goal: Risk for activity intolerance will decrease Outcome: Progressing   Problem: Pain Managment: Goal: General experience of comfort will improve Outcome: Progressing   Problem: Safety: Goal: Ability to remain free from injury will improve Outcome: Progressing   Problem: Skin Integrity: Goal: Risk for impaired skin integrity will decrease Outcome: Progressing

## 2018-05-09 NOTE — Consult Note (Signed)
[place]  [phone] [phone]  ID: Ian Shaffer OB: 1946/10/13  MR#: [id number]  WER#:[id number]  Patient Care Team: Derinda Late, MD as PCP - General (Family Medicine) Margaretha Sheffield, MD (Otolaryngology) Telford Nab, RN as Registered Nurse Nestor Lewandowsky, MD as Referring Physician (Cardiothoracic Surgery) Cammie Sickle, MD as Consulting Physician (Internal Medicine)  CHIEF COMPLAINT: Stage IV malignant mesothelioma, fever unknown origin.  INTERVAL HISTORY: Patient is a 72 year old male who recently initiated single agent gemcitabine on Thursday May 07, 2018 subsequent presented to the emergency room with high fevers of unknown etiology.  Patient feels improved since admission but not back to his baseline.  He has no neurologic complaints.  He has persistent weakness and fatigue.  He denies any chest pain, shortness of breath, hemoptysis, or cough.  He denies any nausea, vomiting, or constipation.  He continues to have intermittent diarrhea.  He has no urinary complaints.  Patient feels generally terrible, but offers no further specific complaints.  REVIEW OF SYSTEMS:   Review of Systems  Constitutional: Positive for fever and malaise/fatigue. Negative for weight loss.  Respiratory: Negative.  Negative for cough, hemoptysis and shortness of breath.   Cardiovascular: Negative.  Negative for chest pain and leg swelling.  Gastrointestinal: Positive for diarrhea. Negative for abdominal pain.  Genitourinary: Negative.  Negative for dysuria.  Musculoskeletal: Negative.  Negative for back pain.  Skin: Negative.  Negative for rash.  Neurological: Positive for weakness. Negative for focal weakness and headaches.  Psychiatric/Behavioral: Negative.  The patient is not nervous/anxious.     As per HPI. Otherwise, a complete review of systems is negative.  PAST MEDICAL HISTORY: Past Medical History:  Diagnosis Date  . Benign essential  hypertension 05/31/2014  . Cancer ([place]) 06/2017   right lung   . Cerebral infarction ([place]) 05/31/2014  . Cerebrovascular disease 05/31/2014  . CKD (chronic kidney disease) stage 3, GFR 30-59 ml/min (HCC) 02/23/2016  . CVA (cerebral infarction)   . Diabetes mellitus without complication ([place])   . Difficult intubation   . DVT (deep venous thrombosis) ([place])   . Esophageal reflux 05/31/2014  . H/O: CVA (cerebrovascular accident)   . Hyperlipidemia   . Hypertension   . ICAO (internal carotid artery occlusion) March 01, 2014   Left  . 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 Pleural effusion 04/23/2017  . Stroke [facility]) April, 4,2015  . Type 2 diabetes mellitus with peripheral neuropathy ([place]) 02/23/2016  . Weakness     PAST SURGICAL HISTORY: Past Surgical History:  Procedure Laterality Date  . CHEST TUBE INSERTION N/A 06/25/2017   Procedure: [id number] CATHETER INSERTION;  Surgeon: Nestor Lewandowsky, MD;  Location: [facility] ORS;  Service: Thoracic;  Laterality: N/A;  . CHEST TUBE INSERTION Right 08/07/2017   Procedure: REMOVAL OF PLEUR X CATHETER;  Surgeon: Nestor Lewandowsky, MD;  Location: [facility] ORS;  Service: General;  Laterality: Right;  . NASAL SINUS SURGERY  2000  . TONSILLECTOMY    . VIDEO ASSISTED THORACOSCOPY Right 06/25/2017   Procedure: VIDEO ASSISTED THORACOSCOPY WITH TALC PLEURODESIS, CHEST TUBE INSERTION;  Surgeon: Nestor Lewandowsky, MD;  Location: [facility] ORS;  Service: Thoracic;  Laterality: Right;    FAMILY HISTORY: Family History  Problem Relation Age of Onset  . Hypertension Mother   . Hypertension Father     ADVANCED DIRECTIVES (Y/N):  @ADVDIR @  HEALTH MAINTENANCE: Social History   Tobacco Use  . Smoking status: Former Smoker    Types: Pipe    Last attempt  to quit: 05/07/1974    Years since quitting: 44.0  . Smokeless tobacco: Never Used  . Tobacco comment: pt states he smoked a pipe a long time ago  Substance Use Topics  . Alcohol use: No  . Drug use: No      Colonoscopy:  PAP:  Bone density:  Lipid panel:  Allergies  Allergen Reactions  . Sulfa Antibiotics Swelling    Facial swelling No tongue or lips swelling, no difficulty breathing.  . Adhesive [Tape] Itching and Rash  . Amoxicillin Nausea Only and Other (See Comments)    Has patient had a PCN reaction causing immediate rash, facial/tongue/throat swelling, SOB or lightheadedness with hypotension: No Has patient had a PCN reaction causing severe rash involving mucus membranes or skin necrosis: No Has patient had a PCN reaction that required hospitalization: No Has patient had a PCN reaction occurring within the last 10 years: Yes If all of the above answers are "NO", then may proceed with Cephalosporin use.   . Other Rash    Surgical tape    Current Facility-Administered Medications  Medication Dose Route Frequency Provider Last Rate Last Dose  . acetaminophen (TYLENOL) tablet 650 mg  650 mg Oral Q6H PRN Harrie Foreman, MD   650 mg at 05/09/18 1301   Or  . acetaminophen (TYLENOL) suppository 650 mg  650 mg Rectal Q6H PRN Harrie Foreman, MD      . amLODipine (NORVASC) tablet 5 mg  5 mg Oral Daily Harrie Foreman, MD   5 mg at 05/09/18 2725  . aspirin EC tablet 81 mg  81 mg Oral Daily Harrie Foreman, MD   81 mg at 05/09/18 3664  . aztreonam (AZACTAM) 2 g in sodium chloride 0.9 % 100 mL IVPB  2 g Intravenous Q8H Harrie Foreman, MD 200 mL/hr at 05/09/18 1301 2 g at 05/09/18 1301  . clopidogrel (PLAVIX) tablet 75 mg  75 mg Oral Daily Harrie Foreman, MD   75 mg at 05/09/18 4034  . docusate sodium (COLACE) capsule 100 mg  100 mg Oral BID Harrie Foreman, MD      . enoxaparin (LOVENOX) injection 40 mg  40 mg Subcutaneous Q24H Harrie Foreman, MD   40 mg at 05/08/18 2214  . fluticasone (FLONASE) 50 MCG/ACT nasal spray 1 spray  1 spray Each Nare Q1400 Harrie Foreman, MD   1 spray at 05/09/18 1301  . insulin aspart (novoLOG) injection 0-15 Units  0-15  Units Subcutaneous TID WC Salary, Montell D, MD   2 Units at 05/09/18 1301  . insulin aspart (novoLOG) injection 0-5 Units  0-5 Units Subcutaneous QHS Salary, Montell D, MD      . levofloxacin (LEVAQUIN) IVPB 750 mg  750 mg Intravenous Q48H Salary, Montell D, MD      . lisinopril (PRINIVIL,ZESTRIL) tablet 10 mg  10 mg Oral Daily Harrie Foreman, MD   10 mg at 05/09/18 7425  . metoprolol succinate (TOPROL-XL) 24 hr tablet 25 mg  25 mg Oral Daily Harrie Foreman, MD   25 mg at 05/09/18 9563  . multivitamin with minerals tablet 1 tablet  1 tablet Oral Daily Salary, Avel Peace, MD   1 tablet at 05/09/18 0823  . ondansetron (ZOFRAN) tablet 4 mg  4 mg Oral Q6H PRN Harrie Foreman, MD   4 mg at 05/09/18 1300   Or  . ondansetron (ZOFRAN) injection 4 mg  4 mg Intravenous Q6H PRN Harrie Foreman,  MD      . polyethylene glycol (MIRALAX / GLYCOLAX) packet 17 g  17 g Oral Daily PRN Harrie Foreman, MD      . pravastatin (PRAVACHOL) tablet 40 mg  40 mg Oral Daily Harrie Foreman, MD   40 mg at 05/09/18 5027  . sodium chloride (OCEAN) 0.65 % nasal spray 1 spray  1 spray Each Nare PRN Salary, Montell D, MD      . vancomycin (VANCOCIN) IVPB 750 mg/150 ml premix  750 mg Intravenous Q18H Orbie Pyo, MD   Stopped at 05/09/18 0103    OBJECTIVE: Vitals:   05/09/18 1256 05/09/18 1451  BP: (!) 149/76   Pulse: 87   Resp: 20   Temp: (!) 102.9 F (39.4 C) 100.2 F (37.9 C)  SpO2: 92%      Body mass index is 27.2 kg/m.    ECOG FS:1 - Symptomatic but completely ambulatory  General: Well-developed, well-nourished, no acute distress. Eyes: Pink conjunctiva, anicteric sclera. HEENT: Normocephalic, moist mucous membranes, clear oropharnyx. Lungs: Clear to auscultation bilaterally. Heart: Regular rate and rhythm. No rubs, murmurs, or gallops. Abdomen: Soft, nontender, nondistended. No organomegaly noted, normoactive bowel sounds. Musculoskeletal: No edema, cyanosis, or  clubbing. Neuro: Alert, answering all questions appropriately. Cranial nerves grossly intact. Skin: No rashes or petechiae noted. Psych: Normal affect. Lymphatics: No cervical, calvicular, axillary or inguinal LAD.   LAB RESULTS:  Lab Results  Component Value Date   NA 137 05/07/2018   K 3.4 (L) 05/07/2018   CL 105 05/07/2018   CO2 18 (L) 05/07/2018   GLUCOSE 199 (H) 05/07/2018   BUN 23 (H) 05/07/2018   CREATININE 1.56 (H) 05/07/2018   CALCIUM 8.4 (L) 05/07/2018   PROT 6.9 05/07/2018   ALBUMIN 3.4 (L) 05/07/2018   AST 39 05/07/2018   ALT 33 05/07/2018   ALKPHOS 110 05/07/2018   BILITOT 1.0 05/07/2018   GFRNONAA 42 (L) 05/07/2018   GFRAA 49 (L) 05/07/2018    Lab Results  Component Value Date   WBC 6.8 05/07/2018   NEUTROABS 5.9 05/07/2018   HGB 11.7 (L) 05/07/2018   HCT 33.5 (L) 05/07/2018   MCV 87.9 05/07/2018   PLT 213 05/07/2018     STUDIES: Ct Chest Wo Contrast  Result Date: 05/06/2018 CLINICAL DATA:  72 year old male with history of metastatic unresectable mesothelioma. Follow-up study. EXAM: CT CHEST WITHOUT CONTRAST TECHNIQUE: Multidetector CT imaging of the chest was performed following the standard protocol without IV contrast. COMPARISON:  Chest CT 02/07/2018. FINDINGS: Cardiovascular: Heart size is normal. There is no significant pericardial fluid, thickening or pericardial calcification. There is aortic atherosclerosis, as well as atherosclerosis of the great vessels of the mediastinum and the coronary arteries, including calcified atherosclerotic plaque in the left anterior descending, left circumflex and right coronary arteries. Mediastinum/Nodes: No pathologically enlarged mediastinal or hilar lymph nodes. Please note that accurate exclusion of hilar adenopathy is limited on noncontrast CT scans. Esophagus is unremarkable in appearance. No axillary lymphadenopathy. Lungs/Pleura: Previously noted pleural-based mass in the inferior aspect of the right hemithorax  appears grossly less bulky than the prior study measuring approximately 4.9 x 2.7 cm on today's examination (axial image 111 of series 2). Some adjacent pleural thickening and trace volume of right-sided pleural fluid. Some other calcified pleural plaques are noted in the right hemithorax. Previously noted left lower lobe pulmonary nodule has significantly increased in size, currently measuring 15 mm in diameter (axial image 77 of series 3) as compared with 6 mm  on prior study from 01/28/2018. Likewise, a right middle lobe nodule which was previously only 2 mm in size currently measures 10 x 8 mm (axial image 99 of series 3). 7 mm nodule in the periphery of the right upper lobe (axial image 68 of series 3) increased from 2 mm on the prior examination. Several other smaller 2-3 mm pulmonary nodules are noted in the lungs, stable compared to prior examinations and nonspecific but likely benign. No consolidative airspace disease. Upper Abdomen: Aortic atherosclerosis. Extension of right pleural base mass into right lobe of the liver is again noted, with clear progression compared to the prior examination. The area of hepatic involvement now encompassing approximately 7.9 x 11.2 cm (axial image 123 of series 2) involving portions of segments 6, 7 and 8. Musculoskeletal: There are no aggressive appearing lytic or blastic lesions noted in the visualized portions of the skeleton. IMPRESSION: 1. Mixed response to therapy. Specifically, while the primary lesion in the base of the right hemithorax has decreased in size, direct extension into the right lobe of the liver, as well as developing multifocal pulmonary metastases is noted on today's examination. 2. Aortic atherosclerosis, in addition to three-vessel coronary artery disease. Assessment for potential risk factor modification, dietary therapy or pharmacologic therapy may be warranted, if clinically indicated. Aortic Atherosclerosis (ICD10-I70.0). Electronically Signed    By: Vinnie Langton M.D.   On: 05/06/2018 13:20   Dg Chest Port 1 View  Result Date: 05/07/2018 CLINICAL DATA:  Fever and shortness of breath EXAM: PORTABLE CHEST 1 VIEW COMPARISON:  September 08, 2017 FINDINGS: The heart size and mediastinal contours are within normal limits. Both lungs are clear. The visualized skeletal structures are unremarkable. IMPRESSION: No active disease. Electronically Signed   By: Dorise Bullion III M.D   On: 05/07/2018 21:17    ASSESSMENT: Stage IV malignant mesothelioma, fever unknown origin.  PLAN:    1. Stage IV malignant mesothelioma: Patient received cycle 1, day 1 of single agent gemcitabine 1000 mg/m on Thursday, May 07, 2018.  Unclear if fevers are related to treatment or not.  Patient has been instructed to keep his previously scheduled follow-up appointment on May 14, 2018 for further evaluation. 2.  Fevers: Unknown etiology.  Continue current antibiotics.  ID consult pending.  Patient is not neutropenic.  Blood and urine cultures negative today.  Patient was on long-term steroids previously, unclear if the immunosuppression caused by this could be related despite normal white blood cell count.  Will await ID input.  Continue to monitor. 3.  Chronic renal insufficiency: Patient's creatinine appears to be at his baseline.  Monitor.  Appreciate consult, will follow.  Lloyd Huger, MD   05/09/2018 4:47 PM

## 2018-05-09 NOTE — Progress Notes (Signed)
[place] [facility] [place] NAME: Ian Shaffer    MR#:  [id number]  DATE OF BIRTH:  12-26-1945  SUBJECTIVE:  CHIEF COMPLAINT:   Chief Complaint  Patient presents with  . Fever  No complaints   REVIEW OF SYSTEMS:  CONSTITUTIONAL: No fever, fatigue or weakness.  EYES: No blurred or double vision.  EARS, NOSE, AND THROAT: No tinnitus or ear pain.  RESPIRATORY: No cough, shortness of breath, wheezing or hemoptysis.  CARDIOVASCULAR: No chest pain, orthopnea, edema.  GASTROINTESTINAL: No nausea, vomiting, diarrhea or abdominal pain.  GENITOURINARY: No dysuria, hematuria.  ENDOCRINE: No polyuria, nocturia,  HEMATOLOGY: No anemia, easy bruising or bleeding SKIN: No rash or lesion. MUSCULOSKELETAL: No joint pain or arthritis.   NEUROLOGIC: No tingling, numbness, weakness.  PSYCHIATRY: No anxiety or depression.   ROS  DRUG ALLERGIES:   Allergies  Allergen Reactions  . Sulfa Antibiotics Swelling    Facial swelling No tongue or lips swelling, no difficulty breathing.  . Adhesive [Tape] Itching and Rash  . Amoxicillin Nausea Only and Other (See Comments)    Has patient had a PCN reaction causing immediate rash, facial/tongue/throat swelling, SOB or lightheadedness with hypotension: No Has patient had a PCN reaction causing severe rash involving mucus membranes or skin necrosis: No Has patient had a PCN reaction that required hospitalization: No Has patient had a PCN reaction occurring within the last 10 years: Yes If all of the above answers are "NO", then may proceed with Cephalosporin use.   . Other Rash    Surgical tape    VITALS:  Blood pressure 138/74, pulse 88, temperature 97.6 F (36.4 C), temperature source Axillary, resp. rate 16, height 5\' 10"  (1.778 m), weight 86 kg (189 lb 9 oz), SpO2 98 %.  PHYSICAL EXAMINATION:  GENERAL:  72 y.o.-year-old patient lying in the bed with no acute distress.  EYES: Pupils equal, round, reactive to  light and accommodation. No scleral icterus. Extraocular muscles intact.  HEENT: Head atraumatic, normocephalic. Oropharynx and nasopharynx clear.  NECK:  Supple, no jugular venous distention. No thyroid enlargement, no tenderness.  LUNGS: Normal breath sounds bilaterally, no wheezing, rales,rhonchi or crepitation. No use of accessory muscles of respiration.  CARDIOVASCULAR: S1, S2 normal. No murmurs, rubs, or gallops.  ABDOMEN: Soft, nontender, nondistended. Bowel sounds present. No organomegaly or mass.  EXTREMITIES: No pedal edema, cyanosis, or clubbing.  NEUROLOGIC: Cranial nerves II through XII are intact. Muscle strength 5/5 in all extremities. Sensation intact. Gait not checked.  PSYCHIATRIC: The patient is alert and oriented x 3.  SKIN: No obvious rash, lesion, or ulcer.   Physical Exam LABORATORY PANEL:   CBC Recent Labs  Lab 05/07/18 2032  WBC 6.8  HGB 11.7*  HCT 33.5*  PLT 213   ------------------------------------------------------------------------------------------------------------------  Chemistries  Recent Labs  Lab 05/07/18 2032  NA 137  K 3.4*  CL 105  CO2 18*  GLUCOSE 199*  BUN 23*  CREATININE 1.56*  CALCIUM 8.4*  AST 39  ALT 33  ALKPHOS 110  BILITOT 1.0   ------------------------------------------------------------------------------------------------------------------  Cardiac Enzymes No results for input(s): TROPONINI in the last 168 hours. ------------------------------------------------------------------------------------------------------------------  RADIOLOGY:  Dg Chest Port 1 View  Result Date: 05/07/2018 CLINICAL DATA:  Fever and shortness of breath EXAM: PORTABLE CHEST 1 VIEW COMPARISON:  September 08, 2017 FINDINGS: The heart size and mediastinal contours are within normal limits. Both lungs are clear. The visualized skeletal structures are unremarkable. IMPRESSION: No active disease. Electronically Signed  By: Dorise Bullion III M.D    On: 05/07/2018 21:17    ASSESSMENT AND PLAN:  This is a 72 year old male admitted for sepsis  *Acute sepsis Resolved Etiology unknown Exacerbated by immunosuppression given lung cancer on chemo Continue sepsis protocol, empiric aztreonam/Levaquin/vancomycin, follow-up on outstanding cultures, check echocardiogram given persistent fevers, infectious disease consultation for expert opinion  *Chronic mesothelioma on chemo Diagnosed August 2018 Oncology to see for continuity of care CT chest noted for mixed response to therapy. Specifically, while the primary lesion in the base of the right hemithorax has decreased in size, direct extension into the right lobe of the liver, as well as developing multifocal pulmonary metastases was noted   *Acute dehydration Resolved with IV fluids for rehydration  Discontinue IV fluids  *Chronic CAD Stable Continue aspirin, Plavix, and statin therapy  *Chronic benign essential hypertension Stable on amlodipine, lisinopril and metoprolol  *Chronic Diabetes mellitus type 2 Continue SSI with Accu-Cheks per routine Continue to hold Victoza  *Chronic Hyperlipidemia Stable  continue statin therapy  *CKD, III stable Stable Continue to avoid nephrotoxic agents   Disposition Home in 1 to 2 days barring any complications   All the records are reviewed and case discussed with Care Management/Social Workerr. Management plans discussed with the patient, family and they are in agreement.  CODE STATUS: full TOTAL TIME TAKING CARE OF THIS PATIENT: 35 minutes.     POSSIBLE D/C IN 1-3 DAYS, DEPENDING ON CLINICAL CONDITION.   Avel Peace [name] M.D on 05/09/2018   Between 7am to 6pm - Pager - [phone]  After 6pm go to [email or web address] - password [email or web address] [facility]  Office  [phone]  CC: Primary care physician; Derinda Late, MD  Note: This dictation was prepared with Dragon dictation along with smaller  phrase technology. Any transcriptional errors that result from this process are unintentional.

## 2018-05-10 LAB — GASTROINTESTINAL PANEL BY PCR, STOOL (REPLACES STOOL CULTURE)
ADENOVIRUS F40/41: NOT DETECTED
ASTROVIRUS: NOT DETECTED
Campylobacter species: NOT DETECTED
Cryptosporidium: NOT DETECTED
Cyclospora cayetanensis: NOT DETECTED
ENTEROAGGREGATIVE E COLI (EAEC): NOT DETECTED
ENTEROPATHOGENIC E COLI (EPEC): NOT DETECTED
ENTEROTOXIGENIC E COLI (ETEC): NOT DETECTED
Entamoeba histolytica: NOT DETECTED
GIARDIA LAMBLIA: NOT DETECTED
NOROVIRUS GI/GII: NOT DETECTED
Plesimonas shigelloides: NOT DETECTED
ROTAVIRUS A: NOT DETECTED
SHIGELLA/ENTEROINVASIVE E COLI (EIEC): NOT DETECTED
Salmonella species: NOT DETECTED
Sapovirus (I, II, IV, and V): NOT DETECTED
Shiga like toxin producing E coli (STEC): NOT DETECTED
Vibrio cholerae: NOT DETECTED
Vibrio species: NOT DETECTED
Yersinia enterocolitica: NOT DETECTED

## 2018-05-10 LAB — GLUCOSE, CAPILLARY
Glucose-Capillary: 101 mg/dL — ABNORMAL HIGH (ref 65–99)
Glucose-Capillary: 184 mg/dL — ABNORMAL HIGH (ref 65–99)
Glucose-Capillary: 132 mg/dL — ABNORMAL HIGH (ref 65–99)
Glucose-Capillary: 156 mg/dL — ABNORMAL HIGH (ref 65–99)

## 2018-05-10 LAB — ECHOCARDIOGRAM COMPLETE
HEIGHTINCHES: 70 in
Weight: 3033 oz

## 2018-05-10 LAB — CREATININE, SERUM
CREATININE: 1.38 mg/dL — AB (ref 0.61–1.24)
GFR calc Af Amer: 57 mL/min — ABNORMAL LOW (ref >60)
GFR, EST NON AFRICAN AMERICAN: 49 mL/min — AB (ref >60)

## 2018-05-10 LAB — VANCOMYCIN, TROUGH: VANCOMYCIN TR: 16 ug/mL (ref 15–20)

## 2018-05-10 MED ORDER — FLORANEX PO PACK
1.0000 g | PACK | Freq: Three times a day (TID) | ORAL | Status: DC
  Administered 2018-05-10 – 2018-05-12 (×6): 1 g via ORAL
  Filled 2018-05-10 (×10): qty 1

## 2018-05-10 MED ORDER — VANCOMYCIN HCL IN DEXTROSE 750-5 MG/150ML-% IV SOLN
750.0000 mg | Freq: Two times a day (BID) | INTRAVENOUS | Status: DC
  Administered 2018-05-10 – 2018-05-11 (×2): 750 mg via INTRAVENOUS
  Filled 2018-05-10 (×3): qty 150

## 2018-05-10 MED ORDER — IBUPROFEN 400 MG PO TABS: 400.0000 mg | ORAL_TABLET | Freq: Four times a day (QID) | ORAL | Status: DC | PRN

## 2018-05-10 NOTE — Plan of Care (Signed)
  Problem: Education: Goal: Knowledge of General Education information will improve Outcome: Progressing   Problem: Clinical Measurements: Goal: Will remain free from infection Outcome: Progressing   Problem: Activity: Goal: Risk for activity intolerance will decrease Outcome: Progressing   Problem: Pain Managment: Goal: General experience of comfort will improve Outcome: Progressing   Problem: Safety: Goal: Ability to remain free from injury will improve Outcome: Progressing   Problem: Skin Integrity: Goal: Risk for impaired skin integrity will decrease Outcome: Progressing   Problem: Skin Integrity: Goal: Risk for impaired skin integrity will decrease Outcome: Progressing

## 2018-05-10 NOTE — Progress Notes (Signed)
[facility] NAME: Ian Shaffer    MR#:  [id number]  DATE OF BIRTH:  [birth year]  SUBJECTIVE:  CHIEF COMPLAINT:   Chief Complaint  Patient presents with  . Fever  And discussion with nursing staff-patient continues to have loose stools, continued intermittent fevers uncontrolled with Tylenol, oncology input appreciated, infectious disease to see, check GI panel, Motrin for breakthrough fever, Lactinex, follow-up on echo  REVIEW OF SYSTEMS:  CONSTITUTIONAL: No fever, fatigue or weakness.  EYES: No blurred or double vision.  EARS, NOSE, AND THROAT: No tinnitus or ear pain.  RESPIRATORY: No cough, shortness of breath, wheezing or hemoptysis.  CARDIOVASCULAR: No chest pain, orthopnea, edema.  GASTROINTESTINAL: No nausea, vomiting, diarrhea or abdominal pain.  GENITOURINARY: No dysuria, hematuria.  ENDOCRINE: No polyuria, nocturia,  HEMATOLOGY: No anemia, easy bruising or bleeding SKIN: No rash or lesion. MUSCULOSKELETAL: No joint pain or arthritis.   NEUROLOGIC: No tingling, numbness, weakness.  PSYCHIATRY: No anxiety or depression.   ROS  DRUG ALLERGIES:   Allergies  Allergen Reactions  . Sulfa Antibiotics Swelling    Facial swelling No tongue or lips swelling, no difficulty breathing.  . Adhesive [Tape] Itching and Rash  . Amoxicillin Nausea Only and Other (See Comments)    Has patient had a PCN reaction causing immediate rash, facial/tongue/throat swelling, SOB or lightheadedness with hypotension: No Has patient had a PCN reaction causing severe rash involving mucus membranes or skin necrosis: No Has patient had a PCN reaction that required hospitalization: No Has patient had a PCN reaction occurring within the last 10 years: Yes If all of the above answers are "NO", then may proceed with Cephalosporin use.   . Other Rash    Surgical tape    VITALS:  Blood pressure (!) 144/77, pulse 82, temperature 98.9 F (37.2 C),  temperature source Oral, resp. rate 16, height 5\' 10"  (1.778 m), weight 86 kg (189 lb 9 oz), SpO2 97 %.  PHYSICAL EXAMINATION:  GENERAL:  72 y.o.-year-old patient lying in the bed with no acute distress.  EYES: Pupils equal, round, reactive to light and accommodation. No scleral icterus. Extraocular muscles intact.  HEENT: Head atraumatic, normocephalic. Oropharynx and nasopharynx clear.  NECK:  Supple, no jugular venous distention. No thyroid enlargement, no tenderness.  LUNGS: Normal breath sounds bilaterally, no wheezing, rales,rhonchi or crepitation. No use of accessory muscles of respiration.  CARDIOVASCULAR: S1, S2 normal. No murmurs, rubs, or gallops.  ABDOMEN: Soft, nontender, nondistended. Bowel sounds present. No organomegaly or mass.  EXTREMITIES: No pedal edema, cyanosis, or clubbing.  NEUROLOGIC: Cranial nerves II through XII are intact. Muscle strength 5/5 in all extremities. Sensation intact. Gait not checked.  PSYCHIATRIC: The patient is alert and oriented x 3.  SKIN: No obvious rash, lesion, or ulcer.   Physical Exam LABORATORY PANEL:   CBC Recent Labs  Lab 05/07/18 2032  WBC 6.8  HGB 11.7*  HCT 33.5*  PLT 213   ------------------------------------------------------------------------------------------------------------------  Chemistries  Recent Labs  Lab 05/07/18 2032 05/10/18 0418  NA 137  --   K 3.4*  --   CL 105  --   CO2 18*  --   GLUCOSE 199*  --   BUN 23*  --   CREATININE 1.56* 1.38*  CALCIUM 8.4*  --   AST 39  --   ALT 33  --   ALKPHOS 110  --   BILITOT 1.0  --    ------------------------------------------------------------------------------------------------------------------  Cardiac Enzymes No results  for input(s): TROPONINI in the last 168 hours. ------------------------------------------------------------------------------------------------------------------  RADIOLOGY:  No results found.  ASSESSMENT AND PLAN:  This is a  72 year old male admitted for sepsis  *Acute sepsis Resolved Etiology unknown Exacerbated by immunosuppression given lung canceronchemo Continue sepsis protocol, empiric aztreonam/Levaquin/vancomycin, cultures negative thus far, follow-up on echo, check GI panel, await infectious disease evaluation/recommendations given persistent fevers  *Chronic mesothelioma on chemo Diagnosed August 2018 Oncology input appreciated, has follow-up appointment on May 14, 2018 for reevaluation CT chest noted for mixed response to therapy. Specifically, while the primary lesion in the base of the right hemithorax has decreased in size, direct extension into the right lobe of the liver, as well as developing multifocal pulmonary metastases was noted   *Acute diarrhea Most likely secondary to chemo Check stool studies  *Acute dehydration Resolved with IV fluids   *ChronicCAD Stable Continue aspirin,Plavix, and statin therapy  *Chronic benign essential hypertension Stable onamlodipine, lisinopril and metoprolol  *ChronicDiabetes mellitus type 2 Continue SSI with Accu-Cheks per routine Continue to holdVictoza  *ChronicHyperlipidemia Stable  continue statin therapy  *CKD,III stable Stable Continue toavoid nephrotoxic agents   Disposition Home in 1 to 2 days barring any complications  All the records are reviewed and case discussed with Care Management/Social Workerr. Management plans discussed with the patient, family and they are in agreement.  CODE STATUS: full  TOTAL TIME TAKING CARE OF THIS PATIENT: 35 minutes.     POSSIBLE D/C IN 1-3 DAYS, DEPENDING ON CLINICAL CONDITION.   Avel Peace Salary M.D on 05/10/2018   Between 7am to 6pm - Pager - [phone]  After 6pm go to [email or web address] - password [email or web address] [facility]  Office  [phone]  CC: Primary care physician; Derinda Late, MD  Note: This dictation was prepared with Dragon  dictation along with smaller phrase technology. Any transcriptional errors that result from this process are unintentional.

## 2018-05-10 NOTE — Progress Notes (Signed)
Assessment done. Awake and sitting in chair without  Distress or c/o. Pt is independently ambulatory in room and in hallway walking. Call bell in reach. Instructed to call for needs.

## 2018-05-10 NOTE — Plan of Care (Signed)
  Problem: Education: Goal: Knowledge of General Education information will improve Outcome: Progressing   Problem: Clinical Measurements: Goal: Will remain free from infection Outcome: Progressing   

## 2018-05-10 NOTE — Progress Notes (Signed)
Pharmacy Antibiotic Note  Ian Shaffer is a 72 y.o. male with a h/o mesothelioma on chemotherapy admitted on 05/07/2018 with sepsis.  Pharmacy has been consulted for vancomycin, Levaquin, and aztreonam dosing dosing.  A/P: Vancomycin trough drawn after dose much lower than expected for peak level. Will empirically increase vancomycin dosing to 750 mg iv q 12 hours and check a trough with the 5th dose.  Continue Levaquin 750 mg iv q 48 hours.   Continue aztreonam 2 g iv q 8 hours.   Height: 5\' 10"  (177.8 cm) Weight: 189 lb 9 oz (86 kg) IBW/kg (Calculated) : 73  Temp (24hrs), Avg:100.7 F (38.2 C), Min:98.9 F (37.2 C), Max:102.9 F (39.4 C)  Recent Labs  Lab 05/07/18 0845 05/07/18 2032 05/07/18 2302 05/10/18 0418 05/10/18 1030  WBC 5.3 6.8  --   --   --   CREATININE 1.59* 1.56*  --  1.38*  --   LATICACIDVEN  --  1.1 0.9  --   --   VANCOTROUGH  --   --   --   --  16    Estimated Creatinine Clearance: 49.2 mL/min (A) (by C-G formula based on SCr of 1.38 mg/dL (H)).    Allergies  Allergen Reactions  . Sulfa Antibiotics Swelling    Facial swelling No tongue or lips swelling, no difficulty breathing.  . Adhesive [Tape] Itching and Rash  . Amoxicillin Nausea Only and Other (See Comments)    Has patient had a PCN reaction causing immediate rash, facial/tongue/throat swelling, SOB or lightheadedness with hypotension: No Has patient had a PCN reaction causing severe rash involving mucus membranes or skin necrosis: No Has patient had a PCN reaction that required hospitalization: No Has patient had a PCN reaction occurring within the last 10 years: Yes If all of the above answers are "NO", then may proceed with Cephalosporin use.   . Other Rash    Surgical tape    Antimicrobials this admission: aztreonam 6/13 >>  vancomycin 6/13 >>  Levaquin 6/13 >>  Dose adjustments this admission: Vancomycin 750 q 18 h >> 750 q 12 h  Microbiology results: 6/16 GI PCR: sent 6/13  BCx: NGTD 6/14 UCx: negative 6/13 MRSA PCR: negative  Thank you for allowing pharmacy to be a part of this patient's care.  Ulice Dash D 05/10/2018 12:10 PM

## 2018-05-11 LAB — BASIC METABOLIC PANEL
ANION GAP: 9 (ref 5–15)
BUN: 21 mg/dL — ABNORMAL HIGH (ref 6–20)
CALCIUM: 8.3 mg/dL — AB (ref 8.9–10.3)
CO2: 23 mmol/L (ref 22–32)
CREATININE: 1.41 mg/dL — AB (ref 0.61–1.24)
Chloride: 109 mmol/L (ref 101–111)
GFR calc Af Amer: 56 mL/min — ABNORMAL LOW (ref >60)
GFR, EST NON AFRICAN AMERICAN: 48 mL/min — AB (ref >60)
GLUCOSE: 119 mg/dL — AB (ref 65–99)
Potassium: 3.3 mmol/L — ABNORMAL LOW (ref 3.5–5.1)
Sodium: 141 mmol/L (ref 135–145)

## 2018-05-11 LAB — CBC WITH DIFFERENTIAL/PLATELET
BASOS ABS: 0 10*3/uL (ref 0–0.1)
Basophils Relative: 1 %
EOS PCT: 2 %
Eosinophils Absolute: 0.1 10*3/uL (ref 0–0.7)
HCT: 27 % — ABNORMAL LOW (ref 40.0–52.0)
Hemoglobin: 9.5 g/dL — ABNORMAL LOW (ref 13.0–18.0)
LYMPHS PCT: 16 %
Lymphs Abs: 0.6 10*3/uL — ABNORMAL LOW (ref 1.0–3.6)
MCH: 30.8 pg (ref 26.0–34.0)
MCHC: 35.3 g/dL (ref 32.0–36.0)
MCV: 87.2 fL (ref 80.0–100.0)
MONO ABS: 0.1 10*3/uL — AB (ref 0.2–1.0)
Monocytes Relative: 2 %
Neutro Abs: 2.7 10*3/uL (ref 1.4–6.5)
Neutrophils Relative %: 79 %
Platelets: 248 10*3/uL (ref 150–440)
RBC: 3.1 MIL/uL — ABNORMAL LOW (ref 4.40–5.90)
RDW: 13.1 % (ref 11.5–14.5)
WBC: 3.5 10*3/uL — ABNORMAL LOW (ref 3.8–10.6)

## 2018-05-11 LAB — GLUCOSE, CAPILLARY
Glucose-Capillary: 126 mg/dL — ABNORMAL HIGH (ref 65–99)
Glucose-Capillary: 160 mg/dL — ABNORMAL HIGH (ref 65–99)
Glucose-Capillary: 101 mg/dL — ABNORMAL HIGH (ref 65–99)
Glucose-Capillary: 134 mg/dL — ABNORMAL HIGH (ref 65–99)

## 2018-05-11 MED ORDER — DOXYCYCLINE HYCLATE 100 MG PO TABS
100.0000 mg | ORAL_TABLET | Freq: Two times a day (BID) | ORAL | Status: DC
  Administered 2018-05-11 – 2018-05-12 (×2): 100 mg via ORAL
  Filled 2018-05-11 (×4): qty 1

## 2018-05-11 MED ORDER — POTASSIUM CHLORIDE 20 MEQ PO PACK
40.0000 meq | PACK | Freq: Once | ORAL | Status: AC
  Administered 2018-05-11: 40 meq via ORAL
  Filled 2018-05-11: qty 2

## 2018-05-11 MED ORDER — ADULT MULTIVITAMIN W/MINERALS CH
1.0000 | ORAL_TABLET | ORAL | Status: DC
  Administered 2018-05-12: 13:00:00 1 via ORAL
  Filled 2018-05-11: qty 1

## 2018-05-11 NOTE — Consult Note (Signed)
[facility] Infectious Disease     Reason for Consult: Fevers, immunocompromise    Referring Physician: Ashok Norris Date of Admission:  05/07/2018   Active Problems:   Sepsis ([place])   Malnutrition of moderate degree   HPI: Ian Shaffer is a 72 y.o. male with hx mesothelioma undergoing chemo, hx CVA< CKD and DM admitted 6/13 with fevers to 104.  On admit his wbc was 5.3 (lymphopenic with ALC 0.4) , UA 0-5 wbc, CT showed mixed response to chemo but tumor was invading liver. LFTs were nml.   UCX, BCX and stool PCR negative.  Currently on gemcitabine - started June 13. He was on pred 20 mg daily for last 3 months.  Previously on Leslie for 3 months but had colitis due to this so was treated with infliximab.  He denies, HA, ST, oral lesion, Cough, SOB, nv abd pain. Some loose stools. No joint sxs, no dysuria. He has developed a rash since admission.   Past Medical History:  Diagnosis Date  . Benign essential hypertension 05/31/2014  . Cancer ([place]) 06/2017   right lung   . Cerebral infarction ([place]) 05/31/2014  . Cerebrovascular disease 05/31/2014  . CKD (chronic kidney disease) stage 3, GFR 30-59 ml/min (HCC) 02/23/2016  . CVA (cerebral infarction)   . Diabetes mellitus without complication ([place])   . Difficult intubation   . DVT (deep venous thrombosis) ([place])   . Esophageal reflux 05/31/2014  . H/O: CVA (cerebrovascular accident)   . Hyperlipidemia   . Hypertension   . ICAO (internal carotid artery occlusion) March 01, 2014   Left  . 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 Pleural effusion 04/23/2017  . Stroke [facility]) April, 4,2015  . Type 2 diabetes mellitus with peripheral neuropathy ([place]) 02/23/2016  . Weakness    Past Surgical History:  Procedure Laterality Date  . CHEST TUBE INSERTION N/A 06/25/2017   Procedure: OIZTIW CATHETER INSERTION;  Surgeon: Nestor Lewandowsky, MD;  Location: [facility] ORS;  Service: Thoracic;  Laterality: N/A;  . CHEST TUBE INSERTION Right  08/07/2017   Procedure: REMOVAL OF PLEUR X CATHETER;  Surgeon: Nestor Lewandowsky, MD;  Location: [facility] ORS;  Service: General;  Laterality: Right;  . NASAL SINUS SURGERY  2000  . TONSILLECTOMY    . VIDEO ASSISTED THORACOSCOPY Right 06/25/2017   Procedure: VIDEO ASSISTED THORACOSCOPY WITH TALC PLEURODESIS, CHEST TUBE INSERTION;  Surgeon: Nestor Lewandowsky, MD;  Location: [facility] ORS;  Service: Thoracic;  Laterality: Right;   Social History   Tobacco Use  . Smoking status: Former Smoker    Types: Pipe    Last attempt to quit: 05/07/1974    Years since quitting: 44.0  . Smokeless tobacco: Never Used  . Tobacco comment: pt states he smoked a pipe a long time ago  Substance Use Topics  . Alcohol use: No  . Drug use: No   Family History  Problem Relation Age of Onset  . Hypertension Mother   . Hypertension Father     Allergies:  Allergies  Allergen Reactions  . Sulfa Antibiotics Swelling    Facial swelling No tongue or lips swelling, no difficulty breathing.  . Adhesive [Tape] Itching and Rash  . Amoxicillin Nausea Only and Other (See Comments)    Has patient had a PCN reaction causing immediate rash, facial/tongue/throat swelling, SOB or lightheadedness with hypotension: No Has patient had a PCN reaction causing severe rash involving mucus membranes or skin necrosis: No Has patient had a PCN reaction that required hospitalization:  No Has patient had a PCN reaction occurring within the last 10 years: Yes If all of the above answers are "NO", then may proceed with Cephalosporin use.   . Other Rash    Surgical tape    Current antibiotics: Antibiotics Given (last 72 hours)    Date/Time Action Medication Dose Rate   05/08/18 2214 New Bag/Given   aztreonam (AZACTAM) 2 g in sodium chloride 0.9 % 100 mL IVPB 2 g 200 mL/hr   05/08/18 2323 New Bag/Given   vancomycin (VANCOCIN) IVPB 750 mg/150 ml premix 750 mg 150 mL/hr   05/09/18 0544 New Bag/Given   aztreonam (AZACTAM) 2 g in sodium chloride  0.9 % 100 mL IVPB 2 g 200 mL/hr   05/09/18 1301 New Bag/Given   aztreonam (AZACTAM) 2 g in sodium chloride 0.9 % 100 mL IVPB 2 g 200 mL/hr   05/09/18 1849 New Bag/Given   vancomycin (VANCOCIN) IVPB 750 mg/150 ml premix 750 mg 150 mL/hr   05/09/18 2135 New Bag/Given   aztreonam (AZACTAM) 2 g in sodium chloride 0.9 % 100 mL IVPB 2 g 200 mL/hr   05/09/18 2136 New Bag/Given   levofloxacin (LEVAQUIN) IVPB 750 mg 750 mg 100 mL/hr   05/10/18 0533 New Bag/Given   aztreonam (AZACTAM) 2 g in sodium chloride 0.9 % 100 mL IVPB 2 g 200 mL/hr   05/10/18 0801 New Bag/Given   vancomycin (VANCOCIN) IVPB 750 mg/150 ml premix 750 mg 150 mL/hr   05/10/18 1342 New Bag/Given   aztreonam (AZACTAM) 2 g in sodium chloride 0.9 % 100 mL IVPB 2 g 200 mL/hr   05/10/18 1943 New Bag/Given   vancomycin (VANCOCIN) IVPB 750 mg/150 ml premix 750 mg 150 mL/hr   05/10/18 2040 New Bag/Given   aztreonam (AZACTAM) 2 g in sodium chloride 0.9 % 100 mL IVPB 2 g 200 mL/hr   05/11/18 0527 New Bag/Given   aztreonam (AZACTAM) 2 g in sodium chloride 0.9 % 100 mL IVPB 2 g 200 mL/hr   05/11/18 0852 New Bag/Given   vancomycin (VANCOCIN) IVPB 750 mg/150 ml premix 750 mg 150 mL/hr   05/11/18 1401 New Bag/Given   aztreonam (AZACTAM) 2 g in sodium chloride 0.9 % 100 mL IVPB 2 g 200 mL/hr      MEDICATIONS: . amLODipine  5 mg Oral Daily  . aspirin EC  81 mg Oral Daily  . clopidogrel  75 mg Oral Daily  . docusate sodium  100 mg Oral BID  . enoxaparin (LOVENOX) injection  40 mg Subcutaneous Q24H  . fluticasone  1 spray Each Nare Q1400  . insulin aspart  0-15 Units Subcutaneous TID WC  . insulin aspart  0-5 Units Subcutaneous QHS  . lactobacillus  1 g Oral TID WC  . lisinopril  10 mg Oral Daily  . metoprolol succinate  25 mg Oral Daily  . multivitamin with minerals  1 tablet Oral Daily  . pravastatin  40 mg Oral Daily    Review of Systems - 11 systems reviewed and negative per HPI   OBJECTIVE: Temp:  [99.1 F (37.3 C)] 99.1 F  (37.3 C) (06/16 2024) Pulse Rate:  [72] 72 (06/16 2024) Resp:  [18] 18 (06/16 2024) BP: (119)/(79) 119/79 (06/16 2024) SpO2:  [95 %] 95 % (06/16 2024) Physical Exam  Constitutional: He is oriented to person, place, and time. He appears well-developed and well-nourished. No distress.  HENT: anicteric,  Mouth/Throat: Oropharynx is clear and moist. No oropharyngeal exudate.  Cardiovascular: Normal rate, regular rhythm  and normal heart sounds.  Pulmonary/Chest: Effort normal and breath sounds normal. No respiratory distress. He has no wheezes.  Abdominal: Soft. Bowel sounds are normal. He exhibits no distension. There is no tenderness.  Lymphadenopathy: He has no cervical adenopathy.  Neurological: He is alert and oriented to person, place, and time.  Skin: fine erythematous rash on arms, legs and chest/abd Psychiatric: He has a normal mood and affect. His behavior is normal.     LABS: Results for orders placed or performed during the hospital encounter of 05/07/18 (from the past 48 hour(s))  Glucose, capillary     Status: Abnormal   Collection Time: 05/09/18  5:03 PM  Result Value Ref Range   Glucose-Capillary 103 (H) 65 - 99 mg/dL  Glucose, capillary     Status: Abnormal   Collection Time: 05/09/18  9:32 PM  Result Value Ref Range   Glucose-Capillary 140 (H) 65 - 99 mg/dL  Creatinine, serum     Status: Abnormal   Collection Time: 05/10/18  4:18 AM  Result Value Ref Range   Creatinine, Ser 1.38 (H) 0.61 - 1.24 mg/dL   GFR calc non Af Amer 49 (L) >60 mL/min   GFR calc Af Amer 57 (L) >60 mL/min    Comment: (NOTE) The eGFR has been calculated using the CKD EPI equation. This calculation has not been validated in all clinical situations. eGFR's persistently <60 mL/min signify possible Chronic Kidney Disease. Performed at [facility], [address] [place], [place] [address]   Glucose, capillary     Status: Abnormal   Collection Time: 05/10/18  7:34 AM  Result Value  Ref Range   Glucose-Capillary 101 (H) 65 - 99 mg/dL  Vancomycin, trough     Status: None   Collection Time: 05/10/18 10:30 AM  Result Value Ref Range   Vancomycin Tr 16 15 - 20 ug/mL    Comment: Performed at [facility], [address], [place], [place] [address]  Gastrointestinal Panel by PCR , Stool     Status: None   Collection Time: 05/10/18 11:26 AM  Result Value Ref Range   Campylobacter species NOT DETECTED NOT DETECTED   Plesimonas shigelloides NOT DETECTED NOT DETECTED   Salmonella species NOT DETECTED NOT DETECTED   Yersinia enterocolitica NOT DETECTED NOT DETECTED   Vibrio species NOT DETECTED NOT DETECTED   Vibrio cholerae NOT DETECTED NOT DETECTED   Enteroaggregative E coli (EAEC) NOT DETECTED NOT DETECTED   Enteropathogenic E coli (EPEC) NOT DETECTED NOT DETECTED   Enterotoxigenic E coli (ETEC) NOT DETECTED NOT DETECTED   Shiga like toxin producing E coli (STEC) NOT DETECTED NOT DETECTED   Shigella/Enteroinvasive E coli (EIEC) NOT DETECTED NOT DETECTED   Cryptosporidium NOT DETECTED NOT DETECTED   Cyclospora cayetanensis NOT DETECTED NOT DETECTED   Entamoeba histolytica NOT DETECTED NOT DETECTED   Giardia lamblia NOT DETECTED NOT DETECTED   Adenovirus F40/41 NOT DETECTED NOT DETECTED   Astrovirus NOT DETECTED NOT DETECTED   Norovirus GI/GII NOT DETECTED NOT DETECTED   Rotavirus A NOT DETECTED NOT DETECTED   Sapovirus (I, II, IV, and V) NOT DETECTED NOT DETECTED    Comment: Performed at [facility], [address] [place], [place] [address]  Glucose, capillary     Status: Abnormal   Collection Time: 05/10/18 11:49 AM  Result Value Ref Range   Glucose-Capillary 184 (H) 65 - 99 mg/dL  Glucose, capillary     Status: Abnormal   Collection Time: 05/10/18  5:15 PM  Result Value Ref Range   Glucose-Capillary 132 (H) 65 - 99 mg/dL  Glucose, capillary     Status: Abnormal   Collection Time: 05/10/18  9:03 PM  Result Value Ref Range    Glucose-Capillary 156 (H) 65 - 99 mg/dL  CBC with Differential/Platelet     Status: Abnormal   Collection Time: 05/11/18  3:45 AM  Result Value Ref Range   WBC 3.5 (L) 3.8 - 10.6 K/uL   RBC 3.10 (L) 4.40 - 5.90 MIL/uL   Hemoglobin 9.5 (L) 13.0 - 18.0 g/dL   HCT 27.0 (L) 40.0 - 52.0 %   MCV 87.2 80.0 - 100.0 fL   MCH 30.8 26.0 - 34.0 pg   MCHC 35.3 32.0 - 36.0 g/dL   RDW 13.1 11.5 - 14.5 %   Platelets 248 150 - 440 K/uL   Neutrophils Relative % 79 %   Neutro Abs 2.7 1.4 - 6.5 K/uL   Lymphocytes Relative 16 %   Lymphs Abs 0.6 (L) 1.0 - 3.6 K/uL   Monocytes Relative 2 %   Monocytes Absolute 0.1 (L) 0.2 - 1.0 K/uL   Eosinophils Relative 2 %   Eosinophils Absolute 0.1 0 - 0.7 K/uL   Basophils Relative 1 %   Basophils Absolute 0.0 0 - 0.1 K/uL    Comment: Performed at [facility], [address] [place], [place] [address]  Basic metabolic panel     Status: Abnormal   Collection Time: 05/11/18  3:45 AM  Result Value Ref Range   Sodium 141 135 - 145 mmol/L   Potassium 3.3 (L) 3.5 - 5.1 mmol/L   Chloride 109 101 - 111 mmol/L   CO2 23 22 - 32 mmol/L   Glucose, Bld 119 (H) 65 - 99 mg/dL   BUN 21 (H) 6 - 20 mg/dL   Creatinine, Ser 1.41 (H) 0.61 - 1.24 mg/dL   Calcium 8.3 (L) 8.9 - 10.3 mg/dL   GFR calc non Af Amer 48 (L) >60 mL/min   GFR calc Af Amer 56 (L) >60 mL/min    Comment: (NOTE) The eGFR has been calculated using the CKD EPI equation. This calculation has not been validated in all clinical situations. eGFR's persistently <60 mL/min signify possible Chronic Kidney Disease.    Anion gap 9 5 - 15    Comment: Performed at [facility], [address] [place], [place] [address]  Glucose, capillary     Status: Abnormal   Collection Time: 05/11/18  7:37 AM  Result Value Ref Range   Glucose-Capillary 126 (H) 65 - 99 mg/dL  Glucose, capillary     Status: Abnormal   Collection Time: 05/11/18 11:40 AM  Result Value Ref Range   Glucose-Capillary 160 (H) 65 -  99 mg/dL   No components found for: ESR, C REACTIVE PROTEIN MICRO: Recent Results (from the past 720 hour(s))  Culture, blood (Routine x 2)     Status: None (Preliminary result)   Collection Time: 05/07/18  8:32 PM  Result Value Ref Range Status   Specimen Description BLOOD BLOOD LEFT FOREARM  Final   Special Requests   Final    BOTTLES DRAWN AEROBIC AND ANAEROBIC Blood Culture results may not be optimal due to an excessive volume of blood received in culture bottles   Culture   Final    NO GROWTH 4 DAYS Performed at [facility], [address] [place], [place] [address]    Report Status PENDING  Incomplete  Culture, blood (Routine  x 2)     Status: None (Preliminary result)   Collection Time: 05/07/18  8:32 PM  Result Value Ref Range Status   Specimen Description BLOOD BLOOD RIGHT FOREARM  Final   Special Requests   Final    BOTTLES DRAWN AEROBIC AND ANAEROBIC Blood Culture results may not be optimal due to an excessive volume of blood received in culture bottles   Culture   Final    NO GROWTH 4 DAYS Performed at [facility], [address], [place], [place] [address]    Report Status PENDING  Incomplete  MRSA PCR Screening     Status: None   Collection Time: 05/07/18 11:50 PM  Result Value Ref Range Status   MRSA by PCR NEGATIVE NEGATIVE Final    Comment:        The GeneXpert MRSA Assay (FDA approved for NASAL specimens only), is one component of a comprehensive MRSA colonization surveillance program. It is not intended to diagnose MRSA infection nor to guide or monitor treatment for MRSA infections. Performed at [facility], [address] [place], [place] [address]   Urine culture     Status: None   Collection Time: 05/08/18 12:09 AM  Result Value Ref Range Status   Specimen Description   Final    URINE, RANDOM Performed at [facility], [address] [place], [place] [address]    Special Requests   Final     NONE Performed at [facility], [address] [place], [place] [address]    Culture   Final    NO GROWTH Performed at [facility], [place] [address] [place], [place] [address]    Report Status 05/09/2018 FINAL  Final  Gastrointestinal Panel by PCR , Stool     Status: None   Collection Time: 05/10/18 11:26 AM  Result Value Ref Range Status   Campylobacter species NOT DETECTED NOT DETECTED Final   Plesimonas shigelloides NOT DETECTED NOT DETECTED Final   Salmonella species NOT DETECTED NOT DETECTED Final   Yersinia enterocolitica NOT DETECTED NOT DETECTED Final   Vibrio species NOT DETECTED NOT DETECTED Final   Vibrio cholerae NOT DETECTED NOT DETECTED Final   Enteroaggregative E coli (EAEC) NOT DETECTED NOT DETECTED Final   Enteropathogenic E coli (EPEC) NOT DETECTED NOT DETECTED Final   Enterotoxigenic E coli (ETEC) NOT DETECTED NOT DETECTED Final   Shiga like toxin producing E coli (STEC) NOT DETECTED NOT DETECTED Final   Shigella/Enteroinvasive E coli (EIEC) NOT DETECTED NOT DETECTED Final   Cryptosporidium NOT DETECTED NOT DETECTED Final   Cyclospora cayetanensis NOT DETECTED NOT DETECTED Final   Entamoeba histolytica NOT DETECTED NOT DETECTED Final   Giardia lamblia NOT DETECTED NOT DETECTED Final   Adenovirus F40/41 NOT DETECTED NOT DETECTED Final   Astrovirus NOT DETECTED NOT DETECTED Final   Norovirus GI/GII NOT DETECTED NOT DETECTED Final   Rotavirus A NOT DETECTED NOT DETECTED Final   Sapovirus (I, II, IV, and V) NOT DETECTED NOT DETECTED Final    Comment: Performed at [facility], [place],  [address]    IMAGING: Ct Chest Wo Contrast  Result Date: 05/06/2018 CLINICAL DATA:  72 year old male with history of metastatic unresectable mesothelioma. Follow-up study. EXAM: CT CHEST WITHOUT CONTRAST TECHNIQUE: Multidetector CT imaging of the chest was performed following the standard protocol without IV contrast. COMPARISON:   Chest CT 02/07/2018. FINDINGS: Cardiovascular: Heart size is normal. There is no significant pericardial fluid, thickening or  pericardial calcification. There is aortic atherosclerosis, as well as atherosclerosis of the great vessels of the mediastinum and the coronary arteries, including calcified atherosclerotic plaque in the left anterior descending, left circumflex and right coronary arteries. Mediastinum/Nodes: No pathologically enlarged mediastinal or hilar lymph nodes. Please note that accurate exclusion of hilar adenopathy is limited on noncontrast CT scans. Esophagus is unremarkable in appearance. No axillary lymphadenopathy. Lungs/Pleura: Previously noted pleural-based mass in the inferior aspect of the right hemithorax appears grossly less bulky than the prior study measuring approximately 4.9 x 2.7 cm on today's examination (axial image 111 of series 2). Some adjacent pleural thickening and trace volume of right-sided pleural fluid. Some other calcified pleural plaques are noted in the right hemithorax. Previously noted left lower lobe pulmonary nodule has significantly increased in size, currently measuring 15 mm in diameter (axial image 77 of series 3) as compared with 6 mm on prior study from 01/28/2018. Likewise, a right middle lobe nodule which was previously only 2 mm in size currently measures 10 x 8 mm (axial image 99 of series 3). 7 mm nodule in the periphery of the right upper lobe (axial image 68 of series 3) increased from 2 mm on the prior examination. Several other smaller 2-3 mm pulmonary nodules are noted in the lungs, stable compared to prior examinations and nonspecific but likely benign. No consolidative airspace disease. Upper Abdomen: Aortic atherosclerosis. Extension of right pleural base mass into right lobe of the liver is again noted, with clear progression compared to the prior examination. The area of hepatic involvement now encompassing approximately 7.9 x 11.2 cm (axial image  123 of series 2) involving portions of segments 6, 7 and 8. Musculoskeletal: There are no aggressive appearing lytic or blastic lesions noted in the visualized portions of the skeleton. IMPRESSION: 1. Mixed response to therapy. Specifically, while the primary lesion in the base of the right hemithorax has decreased in size, direct extension into the right lobe of the liver, as well as developing multifocal pulmonary metastases is noted on today's examination. 2. Aortic atherosclerosis, in addition to three-vessel coronary artery disease. Assessment for potential risk factor modification, dietary therapy or pharmacologic therapy may be warranted, if clinically indicated. Aortic Atherosclerosis (ICD10-I70.0). Electronically Signed   By: Vinnie Langton M.D.   On: 05/06/2018 13:20   Dg Chest Port 1 View  Result Date: 05/07/2018 CLINICAL DATA:  Fever and shortness of breath EXAM: PORTABLE CHEST 1 VIEW COMPARISON:  September 08, 2017 FINDINGS: The heart size and mediastinal contours are within normal limits. Both lungs are clear. The visualized skeletal structures are unremarkable. IMPRESSION: No active disease. Electronically Signed   By: Dorise Bullion III M.D   On: 05/07/2018 21:17    Assessment:   Ian Shaffer is a 72 y.o. 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  His ANC is nml but he is lymphopenic. His rash may be due to his febrile illness or to a drug eruption. No current signs of pyogenic infection.  CT does not show infiltrate. He is not hypoxic nor hypotensive.  Differential is broad.   Recommendations Check Resp viral PCR Check CMV EBV, pcr. Will stop aztreonam, vanco and levo given possible drug eruption. Start doxycycline to cover tick borne illness (deniese tick bite but has been mowing his lawn.  Would  monitor him  for another day esp with the new rash.  Thank you very much for allowing me to participate in the care of this patient. Please call with questions.   Cheral Marker. Ola Spurr, MD

## 2018-05-11 NOTE — Care Management Important Message (Signed)
Important Message  Patient Details  Name: Ian Shaffer MRN: [id number] Date of Birth: 08-30-1946   Medicare Important Message Given:  Yes    Shelbie Ammons, RN 05/11/2018, 11:21 AM

## 2018-05-11 NOTE — Progress Notes (Signed)
[facility] at [place] NAME: Ian Shaffer    MR#:  [id number]  DATE OF BIRTH:  12-05-1945  SUBJECTIVE:  CHIEF COMPLAINT:   Chief Complaint  Patient presents with  . Fever  Patient without complaint, doing well  REVIEW OF SYSTEMS:  CONSTITUTIONAL: No fever, fatigue or weakness.  EYES: No blurred or double vision.  EARS, NOSE, AND THROAT: No tinnitus or ear pain.  RESPIRATORY: No cough, shortness of breath, wheezing or hemoptysis.  CARDIOVASCULAR: No chest pain, orthopnea, edema.  GASTROINTESTINAL: No nausea, vomiting, diarrhea or abdominal pain.  GENITOURINARY: No dysuria, hematuria.  ENDOCRINE: No polyuria, nocturia,  HEMATOLOGY: No anemia, easy bruising or bleeding SKIN: No rash or lesion. MUSCULOSKELETAL: No joint pain or arthritis.   NEUROLOGIC: No tingling, numbness, weakness.  PSYCHIATRY: No anxiety or depression.   ROS  DRUG ALLERGIES:   Allergies  Allergen Reactions  . Sulfa Antibiotics Swelling    Facial swelling No tongue or lips swelling, no difficulty breathing.  . Adhesive [Tape] Itching and Rash  . Amoxicillin Nausea Only and Other (See Comments)    Has patient had a PCN reaction causing immediate rash, facial/tongue/throat swelling, SOB or lightheadedness with hypotension: No Has patient had a PCN reaction causing severe rash involving mucus membranes or skin necrosis: No Has patient had a PCN reaction that required hospitalization: No Has patient had a PCN reaction occurring within the last 10 years: Yes If all of the above answers are "NO", then may proceed with Cephalosporin use.   . Other Rash    Surgical tape    VITALS:  Blood pressure 119/79, pulse 72, temperature 99.1 F (37.3 C), temperature source Oral, resp. rate 18, height 5\' 10"  (1.778 m), weight 86 kg (189 lb 9 oz), SpO2 95 %.  PHYSICAL EXAMINATION:  GENERAL:  72 y.o.-year-old patient lying in the bed with no acute distress.  EYES: Pupils equal,  round, reactive to light and accommodation. No scleral icterus. Extraocular muscles intact.  HEENT: Head atraumatic, normocephalic. Oropharynx and nasopharynx clear.  NECK:  Supple, no jugular venous distention. No thyroid enlargement, no tenderness.  LUNGS: Normal breath sounds bilaterally, no wheezing, rales,rhonchi or crepitation. No use of accessory muscles of respiration.  CARDIOVASCULAR: S1, S2 normal. No murmurs, rubs, or gallops.  ABDOMEN: Soft, nontender, nondistended. Bowel sounds present. No organomegaly or mass.  EXTREMITIES: No pedal edema, cyanosis, or clubbing.  NEUROLOGIC: Cranial nerves II through XII are intact. Muscle strength 5/5 in all extremities. Sensation intact. Gait not checked.  PSYCHIATRIC: The patient is alert and oriented x 3.  SKIN: No obvious rash, lesion, or ulcer.   Physical Exam LABORATORY PANEL:   CBC Recent Labs  Lab 05/11/18 0345  WBC 3.5*  HGB 9.5*  HCT 27.0*  PLT 248   ------------------------------------------------------------------------------------------------------------------  Chemistries  Recent Labs  Lab 05/07/18 2032  05/11/18 0345  NA 137  --  141  K 3.4*  --  3.3*  CL 105  --  109  CO2 18*  --  23  GLUCOSE 199*  --  119*  BUN 23*  --  21*  CREATININE 1.56*   < > 1.41*  CALCIUM 8.4*  --  8.3*  AST 39  --   --   ALT 33  --   --   ALKPHOS 110  --   --   BILITOT 1.0  --   --    < > = values in this interval not displayed.   ------------------------------------------------------------------------------------------------------------------  Cardiac Enzymes No results for input(s): TROPONINI in the last 168 hours. ------------------------------------------------------------------------------------------------------------------  RADIOLOGY:  No results found.  ASSESSMENT AND PLAN:  This is a 72 year old male admitted for sepsis  *Acute sepsis Resolved Etiology unknown Exacerbated by immunosuppression given lung  canceronchemo Continue sepsis protocol, empiric aztreonam/Levaquin/vancomycin, cultures negative thus far, echo noted for stage II diastolic dysfunction/negative for vegetations/normal ejection fraction, GI panel negative, await infectious disease consultation/recommendations  *Chronic mesothelioma on chemo Stable Diagnosed August 2018 Oncology input appreciated, has follow-up appointment on May 14, 2018 for reevaluation CT chest noted formixed response to therapy. Specifically, while the primary lesion in the base of the right hemithorax has decreased in size, direct extension into the right lobe of the liver, as well as developing multifocal pulmonary metastaseswasnoted   *Acute noninfectious diarrhea Most likely secondary to chemo GI panel negative, Lactinex 3 times daily  *Acute dehydration Resolvedwith IV fluids   *ChronicCAD Stable Continue aspirin,Plavix, and statin therapy  *Chronic benign essential hypertension Stable onamlodipine, lisinopril and metoprolol  *ChronicDiabetes mellitus type 2 ContinueSSIwith Accu-Cheks per routine Continue to holdVictoza  *ChronicHyperlipidemia Stable  continue statin therapy  *CKD,IIIstable Stable Continue toavoid nephrotoxic agents  *Acute moderate malnutrition Due to cancer Dietary consult   Disposition Home in1to 2days barring any complications  All the records are reviewed and case discussed with Care Management/Social Workerr. Management plans discussed with the patient, family and they are in agreement.  CODE STATUS: full  TOTAL TIME TAKING CARE OF THIS PATIENT: 35 minutes.     POSSIBLE D/C IN 1-2 DAYS, DEPENDING ON CLINICAL CONDITION.   Avel Peace [name] M.D on 05/11/2018   Between 7am to 6pm - Pager - [phone]  After 6pm go to [email or web address] - password [email or web address] [facility]  Office  [phone]  CC: Primary care physician; Derinda Late, MD  Note:  This dictation was prepared with Dragon dictation along with smaller phrase technology. Any transcriptional errors that result from this process are unintentional.

## 2018-05-11 NOTE — Care Management Note (Signed)
Case Management Note  Patient Details  Name: Ian Shaffer MRN: [id number] Date of Birth: December 15, 1945  Subjective/Objective:   Admitted to [facility] with the diagnosis of sepsis. Lives with wife, Vaughan Basta [phone]). Prescriptions are filled at [facility]. States he hasn't seen Dr. Loney Hering in a long time. Home Health per [facility] for 3-4 weeks in the past. No skilled facility. No home oxygen. Glucometer in the home. Takes care of all basic activities of daily living himself, drives. No falls. Good appetite. Wife will transport.                 Action/Plan: No follow-up needs identified at this time   Expected Discharge Date:                  Expected Discharge Plan:     In-House Referral:     Discharge planning Services     Post Acute Care Choice:    Choice offered to:     DME Arranged:    DME Agency:     HH Arranged:    [place] Agency:     Status of Service:     If discussed at [employer], dates discussed:    Additional Comments:  Shelbie Ammons, RN MSN CCM Care Management [phone] 05/11/2018, 12:49 PM

## 2018-05-11 NOTE — Progress Notes (Signed)
No overnight events. resp easy through the night without distress on room air. Pt amb in hallway last pm without diff. Slept in lon intervals. Call bell in reach.

## 2018-05-12 LAB — RESPIRATORY PANEL BY PCR
Adenovirus: NOT DETECTED
Bordetella pertussis: NOT DETECTED
CHLAMYDOPHILA PNEUMONIAE-RVPPCR: NOT DETECTED
CORONAVIRUS HKU1-RVPPCR: NOT DETECTED
Coronavirus 229E: NOT DETECTED
Coronavirus NL63: NOT DETECTED
Coronavirus OC43: NOT DETECTED
INFLUENZA A-RVPPCR: NOT DETECTED
INFLUENZA B-RVPPCR: NOT DETECTED
Metapneumovirus: NOT DETECTED
Mycoplasma pneumoniae: NOT DETECTED
PARAINFLUENZA VIRUS 3-RVPPCR: NOT DETECTED
PARAINFLUENZA VIRUS 4-RVPPCR: NOT DETECTED
Parainfluenza Virus 1: NOT DETECTED
Parainfluenza Virus 2: NOT DETECTED
RHINOVIRUS / ENTEROVIRUS - RVPPCR: NOT DETECTED
Respiratory Syncytial Virus: NOT DETECTED

## 2018-05-12 LAB — CULTURE, BLOOD (ROUTINE X 2)
CULTURE: NO GROWTH
Culture: NO GROWTH

## 2018-05-12 LAB — GLUCOSE, CAPILLARY
Glucose-Capillary: 109 mg/dL — ABNORMAL HIGH (ref 65–99)
Glucose-Capillary: 144 mg/dL — ABNORMAL HIGH (ref 65–99)

## 2018-05-12 LAB — VANCOMYCIN, TROUGH: Vancomycin Tr: 9 ug/mL — ABNORMAL LOW (ref 15–20)

## 2018-05-12 MED ORDER — DOXYCYCLINE HYCLATE 100 MG PO TABS: 100.0000 mg | ORAL_TABLET | Freq: Two times a day (BID) | ORAL | 0 refills | Status: DC

## 2018-05-12 NOTE — Discharge Summary (Signed)
[facility] at [place] NAME: Ian Shaffer    MR#:  [id number]  DATE OF BIRTH:  [birth year]  DATE OF ADMISSION:  05/07/2018   ADMITTING PHYSICIAN: Harrie Foreman, MD  DATE OF DISCHARGE: 05/12/2018 PRIMARY CARE PHYSICIAN: Derinda Late, MD   ADMISSION DIAGNOSIS:  Sepsis, due to unspecified organism ([place]) [A41.9] DISCHARGE DIAGNOSIS:  Active Problems:   Sepsis ([place])   Malnutrition of moderate degree  SECONDARY DIAGNOSIS:   Past Medical History:  Diagnosis Date  . Benign essential hypertension 05/31/2014  . Cancer ([place]) 06/2017   right lung   . Cerebral infarction ([place]) 05/31/2014  . Cerebrovascular disease 05/31/2014  . CKD (chronic kidney disease) stage 3, GFR 30-59 ml/min (HCC) 02/23/2016  . CVA (cerebral infarction)   . Diabetes mellitus without complication ([place])   . Difficult intubation   . DVT (deep venous thrombosis) ([place])   . Esophageal reflux 05/31/2014  . H/O: CVA (cerebrovascular accident)   . Hyperlipidemia   . Hypertension   . ICAO (internal carotid artery occlusion) March 01, 2014   Left  . 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 Pleural effusion 04/23/2017  . Stroke [facility]) April, 4,2015  . Type 2 diabetes mellitus with peripheral neuropathy ([place]) 02/23/2016  . Weakness    HOSPITAL COURSE:  This is a 72 year old male admitted for sepsis  *Acute sepsis Resolved Etiology unknown Exacerbated by immunosuppression given lung canceronchemo Continue sepsis protocol, empiric aztreonam/Levaquin/vancomycin,cultures negative thus far, echo noted for stage II diastolic dysfunction/negative for vegetations/normal ejection fraction, GI panel negative. Per Dr. Ola Spurr, doxycycline twice daily for 7 days.  *Chronic mesothelioma on chemo Stable Diagnosed August 2018 [place] appreciated,has follow-up appointment on May 14, 2018 for reevaluation CT chest noted formixed response to therapy.  Specifically, while the primary lesion in the base of the right hemithorax has decreased in size, direct extension into the right lobe of the liver, as well as developing multifocal pulmonary metastaseswasnoted  *Acute noninfectious diarrhea Most likely secondary to chemo GI panel negative, Lactinex 3 times daily  *Acute dehydration Resolvedwith IV fluids   *ChronicCAD Stable Continue aspirin,Plavix, and statin therapy  *Chronic benign essential hypertension Stable onamlodipine, lisinopril and metoprolol  *ChronicDiabetes mellitus type 2 ContinueSSIwith Accu-Cheks per routine Resume Victoza after discharge.  *ChronicHyperlipidemia Stable  continue statin therapy  *CKD,IIIstable Stable Continue toavoid nephrotoxic agents  *Acute moderate malnutrition Due to cancer Dietary consult  Rash.  Possible due to drug reaction.  I discussed with Dr. Ola Spurr. DISCHARGE CONDITIONS:  Stable, discharge to home today. CONSULTS OBTAINED:  Treatment Team:  Lloyd Huger, MD Leonel Ramsay, MD DRUG ALLERGIES:   Allergies  Allergen Reactions  . Sulfa Antibiotics Swelling    Facial swelling No tongue or lips swelling, no difficulty breathing.  . Adhesive [Tape] Itching and Rash  . Amoxicillin Nausea Only and Other (See Comments)    Has patient had a PCN reaction causing immediate rash, facial/tongue/throat swelling, SOB or lightheadedness with hypotension: No Has patient had a PCN reaction causing severe rash involving mucus membranes or skin necrosis: No Has patient had a PCN reaction that required hospitalization: No Has patient had a PCN reaction occurring within the last 10 years: Yes If all of the above answers are "NO", then may proceed with Cephalosporin use.   . Other Rash    Surgical tape   DISCHARGE MEDICATIONS:   Allergies as of 05/12/2018      Reactions   Sulfa Antibiotics Swelling  Facial swelling No tongue or lips  swelling, no difficulty breathing.   Adhesive [tape] Itching, Rash   Amoxicillin Nausea Only, Other (See Comments)   Has patient had a PCN reaction causing immediate rash, facial/tongue/throat swelling, SOB or lightheadedness with hypotension: No Has patient had a PCN reaction causing severe rash involving mucus membranes or skin necrosis: No Has patient had a PCN reaction that required hospitalization: No Has patient had a PCN reaction occurring within the last 10 years: Yes If all of the above answers are "NO", then may proceed with Cephalosporin use.   Other Rash   Surgical tape      Medication List    STOP taking these medications   lansoprazole 30 MG capsule Commonly known as:  PREVACID   levofloxacin 250 MG tablet Commonly known as:  LEVAQUIN   magic mouthwash w/lidocaine Soln   predniSONE 20 MG tablet Commonly known as:  DELTASONE     TAKE these medications   amLODipine 5 MG tablet Commonly known as:  NORVASC Take 5 mg by mouth daily.   aspirin EC 81 MG tablet Take 81 mg by mouth daily.   CENTRUM SILVER PO Take 1 tablet by mouth daily.   clopidogrel 75 MG tablet Commonly known as:  PLAVIX Take 75 mg by mouth daily.   doxycycline 100 MG tablet Commonly known as:  VIBRA-TABS Take 1 tablet (100 mg total) by mouth every 12 (twelve) hours.   fluticasone 50 MCG/ACT nasal spray Commonly known as:  FLONASE Place 1 spray into both nostrils daily at 2 PM.   lisinopril 10 MG tablet Commonly known as:  PRINIVIL,ZESTRIL Take 10 mg by mouth daily.   metoprolol succinate 25 MG 24 hr tablet Commonly known as:  TOPROL-XL Take 25 mg by mouth daily.   NERVE PAIN RELIEF SL Take 1 tablet by mouth daily. NERVE RENEW (METHYLCOBALAMIN-BENFOTIAMINE-STABILIZED R-ALPHA LIPOIC ACID)   ondansetron 8 MG tablet Commonly known as:  ZOFRAN Take 8 mg by mouth every 8 (eight) hours as needed for nausea or refractory nausea / vomiting.   polyethylene glycol powder powder Commonly  known as:  GLYCOLAX/MIRALAX Take 17 g by mouth daily as needed (for constipation.).   pravastatin 40 MG tablet Commonly known as:  PRAVACHOL Take 40 mg by mouth daily.   prochlorperazine 10 MG tablet Commonly known as:  COMPAZINE Take 10 mg by mouth every 8 (eight) hours as needed for vomiting.   VICTOZA 18 MG/3ML Sopn Generic drug:  liraglutide Inject 1.2 mg into the skin daily.        DISCHARGE INSTRUCTIONS:  See AVS.  If you experience worsening of your admission symptoms, develop shortness of breath, life threatening emergency, suicidal or homicidal thoughts you must seek medical attention immediately by calling 911 or calling your MD immediately  if symptoms less severe.  You Must read complete instructions/literature along with all the possible adverse reactions/side effects for all the Medicines you take and that have been prescribed to you. Take any new Medicines after you have completely understood and accpet all the possible adverse reactions/side effects.   Please note  You were cared for by a hospitalist during your hospital stay. If you have any questions about your discharge medications or the care you received while you were in the hospital after you are discharged, you can call the unit and asked to speak with the hospitalist on call if the hospitalist that took care of you is not available. Once you are discharged, your primary care physician  will handle any further medical issues. Please note that NO REFILLS for any discharge medications will be authorized once you are discharged, as it is imperative that you return to your primary care physician (or establish a relationship with a primary care physician if you do not have one) for your aftercare needs so that they can reassess your need for medications and monitor your lab values.    On the day of Discharge:  VITAL SIGNS:  Blood pressure 133/79, pulse 79, temperature 98.1 F (36.7 C), temperature source Oral,  resp. rate 18, height 5\' 10"  (1.778 m), weight 189 lb 9 oz (86 kg), SpO2 98 %. PHYSICAL EXAMINATION:  GENERAL:  72 y.o.-year-old patient lying in the bed with no acute distress.  EYES: Pupils equal, round, reactive to light and accommodation. No scleral icterus. Extraocular muscles intact.  HEENT: Head atraumatic, normocephalic. Oropharynx and nasopharynx clear.  NECK:  Supple, no jugular venous distention. No thyroid enlargement, no tenderness.  LUNGS: Normal breath sounds bilaterally, no wheezing, rales,rhonchi or crepitation. No use of accessory muscles of respiration.  CARDIOVASCULAR: S1, S2 normal. No murmurs, rubs, or gallops.  ABDOMEN: Soft, non-tender, non-distended. Bowel sounds present. No organomegaly or mass.  EXTREMITIES: No pedal edema, cyanosis, or clubbing.  NEUROLOGIC: Cranial nerves II through XII are intact. Muscle strength 5/5 in all extremities. Sensation intact. Gait not checked.  PSYCHIATRIC: The patient is alert and oriented x 3.  SKIN: Rash on bilateral upper and lower extremities. DATA REVIEW:   CBC Recent Labs  Lab 05/11/18 0345  WBC 3.5*  HGB 9.5*  HCT 27.0*  PLT 248    Chemistries  Recent Labs  Lab 05/07/18 2032  05/11/18 0345  NA 137  --  141  K 3.4*  --  3.3*  CL 105  --  109  CO2 18*  --  23  GLUCOSE 199*  --  119*  BUN 23*  --  21*  CREATININE 1.56*   < > 1.41*  CALCIUM 8.4*  --  8.3*  AST 39  --   --   ALT 33  --   --   ALKPHOS 110  --   --   BILITOT 1.0  --   --    < > = values in this interval not displayed.     Microbiology Results  Results for orders placed or performed during the hospital encounter of 05/07/18  Culture, blood (Routine x 2)     Status: None   Collection Time: 05/07/18  8:32 PM  Result Value Ref Range Status   Specimen Description BLOOD BLOOD LEFT FOREARM  Final   Special Requests   Final    BOTTLES DRAWN AEROBIC AND ANAEROBIC Blood Culture results may not be optimal due to an excessive volume of blood received  in culture bottles   Culture   Final    NO GROWTH 5 DAYS Performed at [facility], [address] [place], [place] [address]    Report Status 05/12/2018 FINAL  Final  Culture, blood (Routine x 2)     Status: None   Collection Time: 05/07/18  8:32 PM  Result Value Ref Range Status   Specimen Description BLOOD BLOOD RIGHT FOREARM  Final   Special Requests   Final    BOTTLES DRAWN AEROBIC AND ANAEROBIC Blood Culture results may not be optimal due to an excessive volume of blood received in culture bottles   Culture   Final    NO GROWTH 5 DAYS Performed at  [facility], [address] [place], [place] [address]    Report Status 05/12/2018 FINAL  Final  MRSA PCR Screening     Status: None   Collection Time: 05/07/18 11:50 PM  Result Value Ref Range Status   MRSA by PCR NEGATIVE NEGATIVE Final    Comment:        The GeneXpert MRSA Assay (FDA approved for NASAL specimens only), is one component of a comprehensive MRSA colonization surveillance program. It is not intended to diagnose MRSA infection nor to guide or monitor treatment for MRSA infections. Performed at [facility], [address] [place], [place] [address]   Urine culture     Status: None   Collection Time: 05/08/18 12:09 AM  Result Value Ref Range Status   Specimen Description   Final    URINE, RANDOM Performed at [facility], [address], [place], [place] [address]    Special Requests   Final    NONE Performed at [facility], [address], [place], [place] [address]    Culture   Final    NO GROWTH Performed at [facility], [place] [address] [place], [place] [address]    Report Status 05/09/2018 FINAL  Final  Gastrointestinal Panel by PCR , Stool     Status: None   Collection Time: 05/10/18 11:26 AM  Result Value Ref Range Status   Campylobacter species NOT DETECTED NOT DETECTED Final   Plesimonas shigelloides NOT DETECTED NOT DETECTED Final     Salmonella species NOT DETECTED NOT DETECTED Final   Yersinia enterocolitica NOT DETECTED NOT DETECTED Final   Vibrio species NOT DETECTED NOT DETECTED Final   Vibrio cholerae NOT DETECTED NOT DETECTED Final   Enteroaggregative E coli (EAEC) NOT DETECTED NOT DETECTED Final   Enteropathogenic E coli (EPEC) NOT DETECTED NOT DETECTED Final   Enterotoxigenic E coli (ETEC) NOT DETECTED NOT DETECTED Final   Shiga like toxin producing E coli (STEC) NOT DETECTED NOT DETECTED Final   Shigella/Enteroinvasive E coli (EIEC) NOT DETECTED NOT DETECTED Final   Cryptosporidium NOT DETECTED NOT DETECTED Final   Cyclospora cayetanensis NOT DETECTED NOT DETECTED Final   Entamoeba histolytica NOT DETECTED NOT DETECTED Final   Giardia lamblia NOT DETECTED NOT DETECTED Final   Adenovirus F40/41 NOT DETECTED NOT DETECTED Final   Astrovirus NOT DETECTED NOT DETECTED Final   Norovirus GI/GII NOT DETECTED NOT DETECTED Final   Rotavirus A NOT DETECTED NOT DETECTED Final   Sapovirus (I, II, IV, and V) NOT DETECTED NOT DETECTED Final    Comment: Performed at [facility], [address] [place],  [address]  Respiratory Panel by PCR     Status: None   Collection Time: 05/11/18  2:38 PM  Result Value Ref Range Status   Adenovirus NOT DETECTED NOT DETECTED Final   Coronavirus 229E NOT DETECTED NOT DETECTED Final   Coronavirus HKU1 NOT DETECTED NOT DETECTED Final   Coronavirus NL63 NOT DETECTED NOT DETECTED Final   Coronavirus OC43 NOT DETECTED NOT DETECTED Final   Metapneumovirus NOT DETECTED NOT DETECTED Final   Rhinovirus / Enterovirus NOT DETECTED NOT DETECTED Final   Influenza A NOT DETECTED NOT DETECTED Final   Influenza B NOT DETECTED NOT DETECTED Final   Parainfluenza Virus 1 NOT DETECTED NOT DETECTED Final   Parainfluenza Virus 2 NOT DETECTED NOT DETECTED Final   Parainfluenza Virus 3 NOT DETECTED NOT DETECTED Final   Parainfluenza Virus 4 NOT DETECTED NOT DETECTED Final  Respiratory  Syncytial Virus NOT DETECTED NOT DETECTED Final   Bordetella pertussis NOT DETECTED NOT DETECTED Final   Chlamydophila pneumoniae NOT DETECTED NOT DETECTED Final   Mycoplasma pneumoniae NOT DETECTED NOT DETECTED Final    Comment: Performed at [facility], [place] [address] [place], [place] [address]    RADIOLOGY:  No results found.   Management plans discussed with the patient, his wife and they are in agreement.  CODE STATUS: Full Code   TOTAL TIME TAKING CARE OF THIS PATIENT: 33 minutes.    Demetrios Loll M.D on 05/12/2018 at 2:23 PM  Between 7am to 6pm - Pager - [phone]  After 6pm go to [email or web address] - Proofreader  [facility]  Office  [phone]  CC: Primary care physician; Derinda Late, MD   Note: This dictation was prepared with Dragon dictation along with smaller phrase technology. Any transcriptional errors that result from this process are unintentional.

## 2018-05-12 NOTE — Progress Notes (Signed)
Discharge instructions reviewed with patient. Follow up appointments and prescription discussed. IV removed prior to discharge. Patient awaiting wheelchair at this time. Wife at bedside to transport via private vehicle.

## 2018-05-12 NOTE — Progress Notes (Signed)
[place] INFECTIOUS DISEASE PROGRESS NOTE Date of Admission:  05/07/2018     ID: Ian Shaffer is a 72 y.o. male with Fever  *Active Problems:   Sepsis ([name])   Malnutrition of moderate degree   Subjective: Low grade fever but somewhat improved. No new complaints  ROS  Eleven systems are reviewed and negative except per hpi  Medications:  Antibiotics Given (last 72 hours)    Date/Time Action Medication Dose Rate   05/09/18 1849 New Bag/Given   vancomycin (VANCOCIN) IVPB 750 mg/150 ml premix 750 mg 150 mL/hr   05/09/18 2135 New Bag/Given   aztreonam (AZACTAM) 2 g in sodium chloride 0.9 % 100 mL IVPB 2 g 200 mL/hr   05/09/18 2136 New Bag/Given   levofloxacin (LEVAQUIN) IVPB 750 mg 750 mg 100 mL/hr   05/10/18 0533 New Bag/Given   aztreonam (AZACTAM) 2 g in sodium chloride 0.9 % 100 mL IVPB 2 g 200 mL/hr   05/10/18 0801 New Bag/Given   vancomycin (VANCOCIN) IVPB 750 mg/150 ml premix 750 mg 150 mL/hr   05/10/18 1342 New Bag/Given   aztreonam (AZACTAM) 2 g in sodium chloride 0.9 % 100 mL IVPB 2 g 200 mL/hr   05/10/18 1943 New Bag/Given   vancomycin (VANCOCIN) IVPB 750 mg/150 ml premix 750 mg 150 mL/hr   05/10/18 2040 New Bag/Given   aztreonam (AZACTAM) 2 g in sodium chloride 0.9 % 100 mL IVPB 2 g 200 mL/hr   05/11/18 0527 New Bag/Given   aztreonam (AZACTAM) 2 g in sodium chloride 0.9 % 100 mL IVPB 2 g 200 mL/hr   05/11/18 0852 New Bag/Given   vancomycin (VANCOCIN) IVPB 750 mg/150 ml premix 750 mg 150 mL/hr   05/11/18 1401 New Bag/Given   aztreonam (AZACTAM) 2 g in sodium chloride 0.9 % 100 mL IVPB 2 g 200 mL/hr   05/11/18 2213 Given   doxycycline (VIBRA-TABS) tablet 100 mg 100 mg    05/12/18 1016 Given   doxycycline (VIBRA-TABS) tablet 100 mg 100 mg      . amLODipine  5 mg Oral Daily  . aspirin EC  81 mg Oral Daily  . clopidogrel  75 mg Oral Daily  . docusate sodium  100 mg Oral BID  . doxycycline  100 mg Oral Q12H  . fluticasone  1 spray Each Nare Q1400  .  insulin aspart  0-15 Units Subcutaneous TID WC  . insulin aspart  0-5 Units Subcutaneous QHS  . lactobacillus  1 g Oral TID WC  . lisinopril  10 mg Oral Daily  . metoprolol succinate  25 mg Oral Daily  . multivitamin with minerals  1 tablet Oral Q24H  . pravastatin  40 mg Oral Daily    Objective: Vital signs in last 24 hours: Temp:  [98.1 F (36.7 C)-99.9 F (37.7 C)] 98.1 F (36.7 C) (06/18 0443) Pulse Rate:  [77-87] 79 (06/18 1014) Resp:  [18-20] 18 (06/18 0443) BP: (118-159)/(58-79) 133/79 (06/18 1014) SpO2:  [96 %-98 %] 98 % (06/18 0443) Constitutional: He is oriented to person, place, and time. He appears well-developed and well-nourished. No distress.  HENT: anicteric,  Mouth/Throat: Oropharynx is clear and moist. No oropharyngeal exudate.  Cardiovascular: Normal rate, regular rhythm and normal heart sounds.  Pulmonary/Chest: Effort normal and breath sounds normal. No respiratory distress. He has no wheezes.  Abdominal: Soft. Bowel sounds are normal. He exhibits no distension. There is no tenderness.  Lymphadenopathy: He has no cervical adenopathy.  Neurological: He is alert and oriented to  person, place, and time.  Skin: fine erythematous rash on arms, legs and chest/abd Psychiatric: He has a normal mood and affect. His behavior is normal.      Lab Results Recent Labs    05/10/18 0418 05/11/18 0345  WBC  --  3.5*  HGB  --  9.5*  HCT  --  27.0*  NA  --  141  K  --  3.3*  CL  --  109  CO2  --  23  BUN  --  21*  CREATININE 1.38* 1.41*    Microbiology: Results for orders placed or performed during the hospital encounter of 05/07/18  Culture, blood (Routine x 2)     Status: None   Collection Time: 05/07/18  8:32 PM  Result Value Ref Range Status   Specimen Description BLOOD BLOOD LEFT FOREARM  Final   Special Requests   Final    BOTTLES DRAWN AEROBIC AND ANAEROBIC Blood Culture results may not be optimal due to an excessive volume of blood received in  culture bottles   Culture   Final    NO GROWTH 5 DAYS Performed at [facility], [address] [place], [place] [address]    Report Status 05/12/2018 FINAL  Final  Culture, blood (Routine x 2)     Status: None   Collection Time: 05/07/18  8:32 PM  Result Value Ref Range Status   Specimen Description BLOOD BLOOD RIGHT FOREARM  Final   Special Requests   Final    BOTTLES DRAWN AEROBIC AND ANAEROBIC Blood Culture results may not be optimal due to an excessive volume of blood received in culture bottles   Culture   Final    NO GROWTH 5 DAYS Performed at [facility], [address] [place], [place] [address]    Report Status 05/12/2018 FINAL  Final  MRSA PCR Screening     Status: None   Collection Time: 05/07/18 11:50 PM  Result Value Ref Range Status   MRSA by PCR NEGATIVE NEGATIVE Final    Comment:        The GeneXpert MRSA Assay (FDA approved for NASAL specimens only), is one component of a comprehensive MRSA colonization surveillance program. It is not intended to diagnose MRSA infection nor to guide or monitor treatment for MRSA infections. Performed at [facility], [address], [place], [place] [address]   Urine culture     Status: None   Collection Time: 05/08/18 12:09 AM  Result Value Ref Range Status   Specimen Description   Final    URINE, RANDOM Performed at [facility], [address] [place], [place] [address]    Special Requests   Final    NONE Performed at [facility], [address], [place], [place] [address]    Culture   Final    NO GROWTH Performed at [facility], [place] [address] [place],  [address]    Report Status 05/09/2018 FINAL  Final  Gastrointestinal Panel by PCR , Stool     Status: None   Collection Time: 05/10/18 11:26 AM  Result Value Ref Range Status   Campylobacter species NOT DETECTED NOT DETECTED Final   Plesimonas shigelloides NOT DETECTED NOT DETECTED Final    Salmonella species NOT DETECTED NOT DETECTED Final   Yersinia enterocolitica NOT DETECTED NOT DETECTED Final   Vibrio species NOT DETECTED NOT DETECTED Final   Vibrio cholerae NOT DETECTED NOT DETECTED Final   Enteroaggregative E coli (EAEC)  NOT DETECTED NOT DETECTED Final   Enteropathogenic E coli (EPEC) NOT DETECTED NOT DETECTED Final   Enterotoxigenic E coli (ETEC) NOT DETECTED NOT DETECTED Final   Shiga like toxin producing E coli (STEC) NOT DETECTED NOT DETECTED Final   Shigella/Enteroinvasive E coli (EIEC) NOT DETECTED NOT DETECTED Final   Cryptosporidium NOT DETECTED NOT DETECTED Final   Cyclospora cayetanensis NOT DETECTED NOT DETECTED Final   Entamoeba histolytica NOT DETECTED NOT DETECTED Final   Giardia lamblia NOT DETECTED NOT DETECTED Final   Adenovirus F40/41 NOT DETECTED NOT DETECTED Final   Astrovirus NOT DETECTED NOT DETECTED Final   Norovirus GI/GII NOT DETECTED NOT DETECTED Final   Rotavirus A NOT DETECTED NOT DETECTED Final   Sapovirus (I, II, IV, and V) NOT DETECTED NOT DETECTED Final    Comment: Performed at [facility], [address] [place], [place] [address]  Respiratory Panel by PCR     Status: None   Collection Time: 05/11/18  2:38 PM  Result Value Ref Range Status   Adenovirus NOT DETECTED NOT DETECTED Final   Coronavirus 229E NOT DETECTED NOT DETECTED Final   Coronavirus HKU1 NOT DETECTED NOT DETECTED Final   Coronavirus NL63 NOT DETECTED NOT DETECTED Final   Coronavirus OC43 NOT DETECTED NOT DETECTED Final   Metapneumovirus NOT DETECTED NOT DETECTED Final   Rhinovirus / Enterovirus NOT DETECTED NOT DETECTED Final   Influenza A NOT DETECTED NOT DETECTED Final   Influenza B NOT DETECTED NOT DETECTED Final   Parainfluenza Virus 1 NOT DETECTED NOT DETECTED Final   Parainfluenza Virus 2 NOT DETECTED NOT DETECTED Final   Parainfluenza Virus 3 NOT DETECTED NOT DETECTED Final   Parainfluenza Virus 4 NOT DETECTED NOT DETECTED Final   Respiratory  Syncytial Virus NOT DETECTED NOT DETECTED Final   Bordetella pertussis NOT DETECTED NOT DETECTED Final   Chlamydophila pneumoniae NOT DETECTED NOT DETECTED Final   Mycoplasma pneumoniae NOT DETECTED NOT DETECTED Final    Comment: Performed at [facility], [place] [address] [place], [place] [address]    Studies/Results: No results found.  Assessment/Plan: Ian Shaffer is a 72 y.o. 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  His ANC is nml but he is lymphopenic. His rash may be due to his febrile illness or to a drug eruption. No current signs of pyogenic infection.  CT does not show infiltrate. He is not hypoxic nor hypotensive.  Differential is broad.  Resp PCR neg.  6/18- fevers improving,  Recommendations Cont  Doxycycline for a total 7 days abx.  to cover tick borne illness (deniese tick bite but has been mowing his lawn.  Thank you very much for the consult. Will follow with you.  Leonel Ramsay   05/12/2018, 1:34 PM

## 2018-05-12 NOTE — Progress Notes (Signed)
Per Dr. Ola Spurr patient does not need follow up appointment with him. Should have oncology follow up.

## 2018-05-13 ENCOUNTER — Telehealth: Payer: Self-pay | Admitting: *Deleted

## 2018-05-13 LAB — MISC LABCORP TEST (SEND OUT): Labcorp test code: 139490

## 2018-05-13 LAB — CMV DNA, QUANTITATIVE, PCR
CMV DNA QUANT: NEGATIVE [IU]/mL
LOG10 CMV QN DNA PL: UNDETERMINED {Log_IU}/mL

## 2018-05-13 NOTE — Telephone Encounter (Signed)
Patient informed and he will be here for lab doctor tomorrow, he reports that his rash is still pretty bad, but does not itch.

## 2018-05-13 NOTE — Telephone Encounter (Signed)
Patient called asking if he is to keep his appointment tomorrow and if he will be getting Chemotherapy as he was just discharged form hospital/ Please advise

## 2018-05-13 NOTE — Telephone Encounter (Signed)
He will not be getting chemotherapy tomorrow. I can still see him and check his blood counts if he is up to it. Otherwise he needs to see Dr. Rogue Bussing next Wednesday or Thursday with CBC cmp. Possible chemotherapy.

## 2018-05-14 ENCOUNTER — Inpatient Hospital Stay: Payer: Medicare Other

## 2018-05-14 ENCOUNTER — Inpatient Hospital Stay (HOSPITAL_BASED_OUTPATIENT_CLINIC_OR_DEPARTMENT_OTHER): Payer: Medicare Other | Admitting: Oncology

## 2018-05-14 ENCOUNTER — Encounter: Payer: Self-pay | Admitting: Oncology

## 2018-05-14 VITALS — BP 150/79 | HR 89 | Temp 99.2°F | Resp 18 | Ht 70.0 in | Wt 195.4 lb

## 2018-05-14 DIAGNOSIS — C459 Mesothelioma, unspecified: Secondary | ICD-10-CM

## 2018-05-14 DIAGNOSIS — K529 Noninfective gastroenteritis and colitis, unspecified: Secondary | ICD-10-CM

## 2018-05-14 DIAGNOSIS — Z79899 Other long term (current) drug therapy: Secondary | ICD-10-CM

## 2018-05-14 DIAGNOSIS — I129 Hypertensive chronic kidney disease with stage 1 through stage 4 chronic kidney disease, or unspecified chronic kidney disease: Secondary | ICD-10-CM | POA: Diagnosis not present

## 2018-05-14 DIAGNOSIS — R21 Rash and other nonspecific skin eruption: Secondary | ICD-10-CM

## 2018-05-14 DIAGNOSIS — R509 Fever, unspecified: Secondary | ICD-10-CM | POA: Diagnosis not present

## 2018-05-14 DIAGNOSIS — Z5111 Encounter for antineoplastic chemotherapy: Secondary | ICD-10-CM | POA: Diagnosis not present

## 2018-05-14 DIAGNOSIS — E119 Type 2 diabetes mellitus without complications: Secondary | ICD-10-CM | POA: Diagnosis not present

## 2018-05-14 DIAGNOSIS — N183 Chronic kidney disease, stage 3 (moderate): Secondary | ICD-10-CM

## 2018-05-14 DIAGNOSIS — L270 Generalized skin eruption due to drugs and medicaments taken internally: Secondary | ICD-10-CM

## 2018-05-14 LAB — COMPREHENSIVE METABOLIC PANEL
ALK PHOS: 186 U/L — AB (ref 38–126)
ALT: 81 U/L — AB (ref 17–63)
ANION GAP: 8 (ref 5–15)
AST: 53 U/L — ABNORMAL HIGH (ref 15–41)
Albumin: 2.9 g/dL — ABNORMAL LOW (ref 3.5–5.0)
BUN: 17 mg/dL (ref 6–20)
CALCIUM: 8.5 mg/dL — AB (ref 8.9–10.3)
CO2: 24 mmol/L (ref 22–32)
CREATININE: 1.49 mg/dL — AB (ref 0.61–1.24)
Chloride: 106 mmol/L (ref 101–111)
GFR, EST AFRICAN AMERICAN: 52 mL/min — AB (ref >60)
GFR, EST NON AFRICAN AMERICAN: 45 mL/min — AB (ref >60)
Glucose, Bld: 133 mg/dL — ABNORMAL HIGH (ref 65–99)
Potassium: 3.9 mmol/L (ref 3.5–5.1)
Sodium: 138 mmol/L (ref 135–145)
Total Bilirubin: 0.5 mg/dL (ref 0.3–1.2)
Total Protein: 5.9 g/dL — ABNORMAL LOW (ref 6.5–8.1)

## 2018-05-14 LAB — CBC WITH DIFFERENTIAL/PLATELET
Basophils Absolute: 0 10*3/uL (ref 0–0.1)
Basophils Relative: 1 %
EOS ABS: 0.1 10*3/uL (ref 0–0.7)
EOS PCT: 2 %
HCT: 29.8 % — ABNORMAL LOW (ref 40.0–52.0)
HEMOGLOBIN: 10.3 g/dL — AB (ref 13.0–18.0)
LYMPHS ABS: 0.5 10*3/uL — AB (ref 1.0–3.6)
LYMPHS PCT: 15 %
MCH: 30.4 pg (ref 26.0–34.0)
MCHC: 34.7 g/dL (ref 32.0–36.0)
MCV: 87.7 fL (ref 80.0–100.0)
MONOS PCT: 13 %
Monocytes Absolute: 0.4 10*3/uL (ref 0.2–1.0)
Neutro Abs: 2.4 10*3/uL (ref 1.4–6.5)
Neutrophils Relative %: 69 %
PLATELETS: 216 10*3/uL (ref 150–440)
RBC: 3.4 MIL/uL — ABNORMAL LOW (ref 4.40–5.90)
RDW: 13 % (ref 11.5–14.5)
WBC: 3.4 10*3/uL — ABNORMAL LOW (ref 3.8–10.6)

## 2018-05-14 NOTE — Progress Notes (Signed)
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 PN  # MOLECULAR TESTING- PDL-1- 1% [LOW]; [facility]- No actionable**  -----------------------------------   DIAGNOSIS: [AUG 2019 ] ? [name]   STAGE:  IV  ;GOALS: Palliative  CURRENT/MOST RECENT THERAPY [april2019 ]; last keytruda; Gem on HOLD      [name], malignant ([place])   03/19/2018 -  Chemotherapy    The patient had gemcitabine (GEMZAR) 2,000 mg in sodium chloride 0.9 % 250 mL chemo infusion, 2,128 mg, Intravenous,  Once, 1 of 4  cycles Administration: 2,000 mg (05/07/2018)  for chemotherapy treatment.         Interval history-patient received gemcitabine chemotherapy last week.  Following that he developed a fever of 102 and was prescribed oral Levaquin as well as infectious work-up was recommended.  However patient had another temperature of 104 and was hospitalized for 3 to 4 days.  He had an extensive infectious work-up including urinalysis chest x-ray and blood cultures which were negative.  CMV and EBV DNA PCR was negative.  Respiratory panel was also negative.  Infectious disease was consulted and the source of sepsis was not clear and ultimately patient was discharged on oral doxycycline for 7 days.  During his hospitalization patient also received IV aztreonam and ertapenem following which he developed a drug rash all over his body  Patient still has 3 to 4 days of doxycycline left.  His drug rash  has not improved a whole lot and is mildly pruritic.  Denies any fever since he has been discharged from the hospital.  Does feel fatigued and had one episode of nausea this morning.  Denies any diarrhea.  Denies any shortness of breath.  Appetite is fair and he has been keeping up with his oral intake as well  ECOG PS- 1 Pain scale- 0 Opioid associated constipation- no  Review of systems- Review of Systems  Constitutional: Positive for malaise/fatigue. Negative for chills, fever and weight loss.  HENT: Negative for congestion, ear discharge and nosebleeds.   Eyes: Negative for blurred vision.  Respiratory: Negative for cough, hemoptysis, sputum production, shortness of breath and wheezing.   Cardiovascular: Negative for chest pain, palpitations, orthopnea and claudication.  Gastrointestinal: Negative for abdominal pain, blood in stool,  constipation, diarrhea, heartburn, melena, nausea and vomiting.  Genitourinary: Negative for dysuria, flank pain, frequency, hematuria and urgency.  Musculoskeletal: Negative for  back pain, joint pain and myalgias.  Skin: Positive for rash.  Neurological: Negative for dizziness, tingling, focal weakness, seizures, weakness and headaches.  Endo/Heme/Allergies: Does not bruise/bleed easily.  Psychiatric/Behavioral: Negative for depression and suicidal ideas. The patient does not have insomnia.       Allergies  Allergen Reactions  . Sulfa Antibiotics Swelling    Facial swelling No tongue or lips swelling, no difficulty breathing.  . Adhesive [Tape] Itching and Rash  . Amoxicillin Nausea Only and Other (See Comments)    Has patient had a PCN reaction causing immediate rash, facial/tongue/throat swelling, SOB or lightheadedness with hypotension: No Has patient had a PCN reaction causing severe rash involving mucus membranes or skin necrosis: No Has patient had a PCN reaction that required hospitalization: No Has patient had a PCN reaction occurring within the last 10 years: Yes If all of the above answers are "NO", then may proceed with Cephalosporin use.   . Other Rash    Surgical tape     Past Medical History:  Diagnosis Date  . Benign essential hypertension 05/31/2014  . Cancer ([place]) 06/2017   right lung   . Cerebral infarction ([place]) 05/31/2014  . Cerebrovascular disease 05/31/2014  . CKD (chronic kidney disease) stage 3, GFR 30-59 ml/min (HCC) 02/23/2016  . CVA (cerebral infarction)   . Diabetes mellitus without complication ([place])   . Difficult intubation   . DVT (deep venous thrombosis) ([place])   . Esophageal reflux 05/31/2014  . H/O: CVA (cerebrovascular accident)   . Hyperlipidemia   . Hypertension   . ICAO (internal carotid artery occlusion) March 01, 2014   Left  . 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 Pleural effusion 04/23/2017  . Stroke [facility]) April, 4,2015  . Type 2 diabetes mellitus with peripheral neuropathy ([place]) 02/23/2016  . Weakness      Past Surgical History:  Procedure Laterality Date  . CHEST TUBE INSERTION  N/A 06/25/2017   Procedure: OEUMPN CATHETER INSERTION;  Surgeon: Nestor Lewandowsky, MD;  Location: [facility] ORS;  Service: Thoracic;  Laterality: N/A;  . CHEST TUBE INSERTION Right 08/07/2017   Procedure: REMOVAL OF PLEUR X CATHETER;  Surgeon: Nestor Lewandowsky, MD;  Location: [facility] ORS;  Service: General;  Laterality: Right;  . NASAL SINUS SURGERY  2000  . TONSILLECTOMY    . VIDEO ASSISTED THORACOSCOPY Right 06/25/2017   Procedure: VIDEO ASSISTED THORACOSCOPY WITH TALC PLEURODESIS, CHEST TUBE INSERTION;  Surgeon: Nestor Lewandowsky, MD;  Location: [facility] ORS;  Service: Thoracic;  Laterality: Right;    Social History   Socioeconomic History  . Marital status: Married    Spouse name: Not on file  . Number of children: Not on file  . Years of education: Not on file  . Highest education level: Not on file  Occupational History  . Not on file  Social Needs  . Financial resource strain: Not on file  . Food insecurity:    Worry: Not on file    Inability: Not on file  . Transportation needs:    Medical: Not on file    Non-medical: Not on file  Tobacco Use  . Smoking status: Former Smoker    Types: Pipe    Last attempt to quit: 05/07/1974    Years since quitting: 44.0  . Smokeless tobacco: Never Used  . Tobacco comment: pt states he smoked a pipe  a long time ago  Substance and Sexual Activity  . Alcohol use: No  . Drug use: No  . Sexual activity: Not on file  Lifestyle  . Physical activity:    Days per week: Not on file    Minutes per session: Not on file  . Stress: Not on file  Relationships  . Social connections:    Talks on phone: Not on file    Gets together: Not on file    Attends religious service: Not on file    Active member of club or organization: Not on file    Attends meetings of clubs or organizations: Not on file    Relationship status: Not on file  . Intimate partner violence:    Fear of current or ex partner: Not on file    Emotionally abused: Not on file    Physically abused:  Not on file    Forced sexual activity: Not on file  Other Topics Concern  . Not on file  Social History Narrative  . Not on file    Family History  Problem Relation Age of Onset  . Hypertension Mother   . Hypertension Father      Current Outpatient Medications:  .  amLODipine (NORVASC) 5 MG tablet, Take 5 mg by mouth daily., Disp: , Rfl:  .  aspirin EC 81 MG tablet, Take 81 mg by mouth daily., Disp: , Rfl:  .  clopidogrel (PLAVIX) 75 MG tablet, Take 75 mg by mouth daily., Disp: , Rfl:  .  doxycycline (VIBRA-TABS) 100 MG tablet, Take 1 tablet (100 mg total) by mouth every 12 (twelve) hours., Disp: 14 tablet, Rfl: 0 .  fluticasone (FLONASE) 50 MCG/ACT nasal spray, Place 1 spray into both nostrils daily at 2 PM. , Disp: , Rfl:  .  liraglutide (VICTOZA) 18 MG/3ML SOPN, Inject 1.2 mg into the skin daily., Disp: , Rfl:  .  lisinopril (PRINIVIL,ZESTRIL) 10 MG tablet, Take 10 mg by mouth daily., Disp: , Rfl:  .  metoprolol succinate (TOPROL-XL) 25 MG 24 hr tablet, Take 25 mg by mouth daily., Disp: , Rfl:  .  Multiple Vitamins-Minerals (CENTRUM SILVER PO), Take 1 tablet by mouth daily., Disp: , Rfl:  .  ondansetron (ZOFRAN) 8 MG tablet, Take 8 mg by mouth every 8 (eight) hours as needed for nausea or refractory nausea / vomiting. , Disp: , Rfl:  .  prochlorperazine (COMPAZINE) 10 MG tablet, Take 10 mg by mouth every 8 (eight) hours as needed for vomiting. , Disp: , Rfl:  .  Homeopathic Products (NERVE PAIN RELIEF SL), Take 1 tablet by mouth daily. NERVE RENEW (METHYLCOBALAMIN-BENFOTIAMINE-STABILIZED R-ALPHA LIPOIC ACID), Disp: , Rfl:  .  polyethylene glycol powder (GLYCOLAX/MIRALAX) powder, Take 17 g by mouth daily as needed (for constipation.)., Disp: , Rfl:  .  pravastatin (PRAVACHOL) 40 MG tablet, Take 40 mg by mouth daily., Disp: , Rfl:   Physical exam:  Vitals:   05/14/18 0902 05/14/18 0907  BP: (!) 150/79   Pulse: 89   Resp: 18   Temp: 99.2 F (37.3 C)   TempSrc: Tympanic   SpO2:   97%  Weight: 195 lb 6.4 oz (88.6 kg)   Height: _0  (1.778 m)    Physical Exam  Constitutional: He is oriented to person, place, and time. He appears well-developed and well-nourished.  HENT:  Head: Normocephalic and atraumatic.  Eyes: Pupils are equal, round, and reactive to light. EOM are normal.  Neck: Normal range of motion.  Cardiovascular:  Normal rate, regular rhythm and normal heart sounds.  Pulmonary/Chest: Effort normal and breath sounds normal.  Abdominal: Soft. Bowel sounds are normal.  Neurological: He is alert and oriented to person, place, and time.  Skin: Skin is warm and dry.  Generalized erythematous maculopapular rash noted all over the body including the trunk and extremities     CMP Latest Ref Rng & Units 05/14/2018  Glucose 65 - 99 mg/dL 133(H)  BUN 6 - 20 mg/dL 17  Creatinine 0.61 - 1.24 mg/dL 1.49(H)  Sodium 135 - 145 mmol/L 138  Potassium 3.5 - 5.1 mmol/L 3.9  Chloride 101 - 111 mmol/L 106  CO2 22 - 32 mmol/L 24  Calcium 8.9 - 10.3 mg/dL 8.5(L)  Total Protein 6.5 - 8.1 g/dL 5.9(L)  Total Bilirubin 0.3 - 1.2 mg/dL 0.5  Alkaline Phos 38 - 126 U/L 186(H)  AST 15 - 41 U/L 53(H)  ALT 17 - 63 U/L 81(H)   CBC Latest Ref Rng & Units 05/14/2018  WBC 3.8 - 10.6 K/uL 3.4(L)  Hemoglobin 13.0 - 18.0 g/dL 10.3(L)  Hematocrit 40.0 - 52.0 % 29.8(L)  Platelets 150 - 440 K/uL 216    No images are attached to the encounter.  Ct Chest Wo Contrast  Result Date: 05/06/2018 CLINICAL DATA:  71 year old male with history of metastatic unresectable mesothelioma. Follow-up study. EXAM: CT CHEST WITHOUT CONTRAST TECHNIQUE: Multidetector CT imaging of the chest was performed following the standard protocol without IV contrast. COMPARISON:  Chest CT 02/07/2018. FINDINGS: Cardiovascular: Heart size is normal. There is no significant pericardial fluid, thickening or pericardial calcification. There is aortic atherosclerosis, as well as atherosclerosis of the great vessels of the  mediastinum and the coronary arteries, including calcified atherosclerotic plaque in the left anterior descending, left circumflex and right coronary arteries. Mediastinum/Nodes: No pathologically enlarged mediastinal or hilar lymph nodes. Please note that accurate exclusion of hilar adenopathy is limited on noncontrast CT scans. Esophagus is unremarkable in appearance. No axillary lymphadenopathy. Lungs/Pleura: Previously noted pleural-based mass in the inferior aspect of the right hemithorax appears grossly less bulky than the prior study measuring approximately 4.9 x 2.7 cm on today's examination (axial image 111 of series 2). Some adjacent pleural thickening and trace volume of right-sided pleural fluid. Some other calcified pleural plaques are noted in the right hemithorax. Previously noted left lower lobe pulmonary nodule has significantly increased in size, currently measuring 15 mm in diameter (axial image 77 of series 3) as compared with 6 mm on prior study from 01/28/2018. Likewise, a right middle lobe nodule which was previously only 2 mm in size currently measures 10 x 8 mm (axial image 99 of series 3). 7 mm nodule in the periphery of the right upper lobe (axial image 68 of series 3) increased from 2 mm on the prior examination. Several other smaller 2-3 mm pulmonary nodules are noted in the lungs, stable compared to prior examinations and nonspecific but likely benign. No consolidative airspace disease. Upper Abdomen: Aortic atherosclerosis. Extension of right pleural base mass into right lobe of the liver is again noted, with clear progression compared to the prior examination. The area of hepatic involvement now encompassing approximately 7.9 x 11.2 cm (axial image 123 of series 2) involving portions of segments 6, 7 and 8. Musculoskeletal: There are no aggressive appearing lytic or blastic lesions noted in the visualized portions of the skeleton. IMPRESSION: 1. Mixed response to therapy.  Specifically, while the primary lesion in the base of the right hemithorax has decreased  in size, direct extension into the right lobe of the liver, as well as developing multifocal pulmonary metastases is noted on today's examination. 2. Aortic atherosclerosis, in addition to three-vessel coronary artery disease. Assessment for potential risk factor modification, dietary therapy or pharmacologic therapy may be warranted, if clinically indicated. Aortic Atherosclerosis (ICD10-I70.0). Electronically Signed   By: Vinnie Langton M.D.   On: 05/06/2018 13:20   Dg Chest Port 1 View  Result Date: 05/07/2018 CLINICAL DATA:  Fever and shortness of breath EXAM: PORTABLE CHEST 1 VIEW COMPARISON:  September 08, 2017 FINDINGS: The heart size and mediastinal contours are within normal limits. Both lungs are clear. The visualized skeletal structures are unremarkable. IMPRESSION: No active disease. Electronically Signed   By: Dorise Bullion III M.D   On: 05/07/2018 21:17     Assessment and plan- Patient is a 72 y.o. male with malignant mesothelioma here for post hospital discharge follow-up  Patient received 1 dose of gemcitabine chemotherapy on 05/07/2018.  Following which patient was hospitalized for high-grade fever of unclear etiology.  I will hold his chemotherapy today and let him finish his 7 days of doxycycline and make sure that his fever does not recur before starting chemotherapy again.  He will see Dr. Yevette Edwards on 05/20/2018 with CBC and CMP for cycle 1 day 15 of chemotherapy based on his counts and his symptoms  Skin rash: Consistent with drug rash likely secondary to IV antibiotics that he received during the hospital stay.  I would not recommend any steroids at this time given his ongoing fever of unclear etiology.  I have recommended he could try as needed Benadryl if he has significant pruritus from the drug rash but it should likely improve in the next week or so.  He is counts are otherwise stable  today and he is not neutropenic.   Visit Diagnosis 1. [name], malignant ([place])   2. Fever, unspecified fever cause   3. Drug-induced skin rash      Dr. Randa Evens, MD, MPH [facility] at [facility] [phone] 05/14/2018 10:05 AM

## 2018-05-14 NOTE — Progress Notes (Signed)
No new changes noted today 

## 2018-05-15 ENCOUNTER — Telehealth: Payer: Self-pay

## 2018-05-15 NOTE — Telephone Encounter (Signed)
EMMI Follow-up: Received a VM Thursday afternoon after I had left for the day from Ian Shaffer and I returned his call this morning.  Said he was running a fever of 101.7, has taken some extra strength Tylenol and going to check his temperature again.  Asked if he should call the [place] and I suggested he call his PCP.  He said he has 3 pm appointment scheduled today with PCP.  Suggested if temperature continues to rise to call PCP nurse to see if they can work him in earlier if they have a cancellation. He thought that would be a good idea. I let him know there would be 2nd automated call with a different series of questions and to let us know if he had any concerns at that time.

## 2018-05-20 ENCOUNTER — Inpatient Hospital Stay: Payer: Medicare Other

## 2018-05-20 ENCOUNTER — Encounter: Payer: Self-pay | Admitting: Internal Medicine

## 2018-05-20 ENCOUNTER — Other Ambulatory Visit: Payer: Self-pay | Admitting: Hematology and Oncology

## 2018-05-20 ENCOUNTER — Encounter: Payer: Self-pay | Admitting: Emergency Medicine

## 2018-05-20 ENCOUNTER — Other Ambulatory Visit: Payer: Self-pay

## 2018-05-20 ENCOUNTER — Emergency Department: Payer: Medicare Other

## 2018-05-20 ENCOUNTER — Inpatient Hospital Stay (HOSPITAL_BASED_OUTPATIENT_CLINIC_OR_DEPARTMENT_OTHER): Payer: Medicare Other | Admitting: Internal Medicine

## 2018-05-20 ENCOUNTER — Inpatient Hospital Stay
Admission: EM | Admit: 2018-05-20 | Discharge: 2018-05-22 | DRG: 864 | Disposition: A | Discharge status: Home / Self Care | Payer: Medicare Other | Attending: Internal Medicine | Admitting: Internal Medicine

## 2018-05-20 VITALS — BP 144/79 | HR 71 | Temp 98.5°F | Resp 18 | Ht 70.0 in | Wt 194.5 lb

## 2018-05-20 DIAGNOSIS — E1122 Type 2 diabetes mellitus with diabetic chronic kidney disease: Secondary | ICD-10-CM | POA: Diagnosis present

## 2018-05-20 DIAGNOSIS — Z8673 Personal history of transient ischemic attack (TIA), and cerebral infarction without residual deficits: Secondary | ICD-10-CM

## 2018-05-20 DIAGNOSIS — N183 Chronic kidney disease, stage 3 unspecified: Secondary | ICD-10-CM | POA: Diagnosis present

## 2018-05-20 DIAGNOSIS — I129 Hypertensive chronic kidney disease with stage 1 through stage 4 chronic kidney disease, or unspecified chronic kidney disease: Secondary | ICD-10-CM | POA: Diagnosis present

## 2018-05-20 DIAGNOSIS — C459 Mesothelioma, unspecified: Secondary | ICD-10-CM

## 2018-05-20 DIAGNOSIS — Z882 Allergy status to sulfonamides status: Secondary | ICD-10-CM

## 2018-05-20 DIAGNOSIS — Z880 Allergy status to penicillin: Secondary | ICD-10-CM

## 2018-05-20 DIAGNOSIS — E1142 Type 2 diabetes mellitus with diabetic polyneuropathy: Secondary | ICD-10-CM | POA: Diagnosis present

## 2018-05-20 DIAGNOSIS — R509 Fever, unspecified: Secondary | ICD-10-CM

## 2018-05-20 DIAGNOSIS — Z86718 Personal history of other venous thrombosis and embolism: Secondary | ICD-10-CM

## 2018-05-20 DIAGNOSIS — D638 Anemia in other chronic diseases classified elsewhere: Secondary | ICD-10-CM | POA: Diagnosis present

## 2018-05-20 DIAGNOSIS — E785 Hyperlipidemia, unspecified: Secondary | ICD-10-CM | POA: Diagnosis present

## 2018-05-20 DIAGNOSIS — J189 Pneumonia, unspecified organism: Secondary | ICD-10-CM

## 2018-05-20 DIAGNOSIS — K529 Noninfective gastroenteritis and colitis, unspecified: Secondary | ICD-10-CM

## 2018-05-20 DIAGNOSIS — Z87891 Personal history of nicotine dependence: Secondary | ICD-10-CM | POA: Diagnosis not present

## 2018-05-20 DIAGNOSIS — T368X5A Adverse effect of other systemic antibiotics, initial encounter: Secondary | ICD-10-CM | POA: Diagnosis not present

## 2018-05-20 DIAGNOSIS — I10 Essential (primary) hypertension: Secondary | ICD-10-CM | POA: Diagnosis present

## 2018-05-20 DIAGNOSIS — R5081 Fever presenting with conditions classified elsewhere: Secondary | ICD-10-CM | POA: Diagnosis not present

## 2018-05-20 DIAGNOSIS — R502 Drug induced fever: Principal | ICD-10-CM | POA: Diagnosis present

## 2018-05-20 DIAGNOSIS — Z79899 Other long term (current) drug therapy: Secondary | ICD-10-CM

## 2018-05-20 DIAGNOSIS — Z7902 Long term (current) use of antithrombotics/antiplatelets: Secondary | ICD-10-CM

## 2018-05-20 DIAGNOSIS — R6510 Systemic inflammatory response syndrome (SIRS) of non-infectious origin without acute organ dysfunction: Secondary | ICD-10-CM | POA: Diagnosis present

## 2018-05-20 DIAGNOSIS — L271 Localized skin eruption due to drugs and medicaments taken internally: Secondary | ICD-10-CM | POA: Diagnosis not present

## 2018-05-20 DIAGNOSIS — I679 Cerebrovascular disease, unspecified: Secondary | ICD-10-CM | POA: Diagnosis present

## 2018-05-20 DIAGNOSIS — Z881 Allergy status to other antibiotic agents status: Secondary | ICD-10-CM | POA: Diagnosis not present

## 2018-05-20 DIAGNOSIS — Z91048 Other nonmedicinal substance allergy status: Secondary | ICD-10-CM

## 2018-05-20 DIAGNOSIS — E119 Type 2 diabetes mellitus without complications: Secondary | ICD-10-CM

## 2018-05-20 DIAGNOSIS — K219 Gastro-esophageal reflux disease without esophagitis: Secondary | ICD-10-CM | POA: Diagnosis present

## 2018-05-20 DIAGNOSIS — T451X5A Adverse effect of antineoplastic and immunosuppressive drugs, initial encounter: Secondary | ICD-10-CM | POA: Diagnosis present

## 2018-05-20 DIAGNOSIS — R21 Rash and other nonspecific skin eruption: Secondary | ICD-10-CM | POA: Diagnosis not present

## 2018-05-20 LAB — URINALYSIS, ROUTINE W REFLEX MICROSCOPIC
Bacteria, UA: NONE SEEN
Bilirubin Urine: NEGATIVE
Glucose, UA: NEGATIVE mg/dL
Hgb urine dipstick: NEGATIVE
KETONES UR: 20 mg/dL — AB
Leukocytes, UA: NEGATIVE
Nitrite: NEGATIVE
PROTEIN: 30 mg/dL — AB
SQUAMOUS EPITHELIAL / LPF: NONE SEEN (ref 0–5)
Specific Gravity, Urine: 1.023 (ref 1.005–1.030)
pH: 5 (ref 5.0–8.0)

## 2018-05-20 LAB — CBC WITH DIFFERENTIAL/PLATELET
BASOS ABS: 0.1 10*3/uL (ref 0–0.1)
BASOS PCT: 2 %
Basophils Absolute: 0.1 10*3/uL (ref 0–0.1)
Basophils Relative: 1 %
EOS ABS: 0.2 10*3/uL (ref 0–0.7)
EOS PCT: 5 %
EOS PCT: 2 %
Eosinophils Absolute: 0.3 10*3/uL (ref 0–0.7)
HCT: 31.7 % — ABNORMAL LOW (ref 40.0–52.0)
HEMATOCRIT: 32.9 % — AB (ref 40.0–52.0)
HEMOGLOBIN: 11.2 g/dL — AB (ref 13.0–18.0)
Hemoglobin: 11.1 g/dL — ABNORMAL LOW (ref 13.0–18.0)
LYMPHS PCT: 21 %
Lymphocytes Relative: 8 %
Lymphs Abs: 1.3 10*3/uL (ref 1.0–3.6)
Lymphs Abs: 0.8 10*3/uL — ABNORMAL LOW (ref 1.0–3.6)
MCH: 30.8 pg (ref 26.0–34.0)
MCH: 30.2 pg (ref 26.0–34.0)
MCHC: 35 g/dL (ref 32.0–36.0)
MCHC: 33.9 g/dL (ref 32.0–36.0)
MCV: 88.2 fL (ref 80.0–100.0)
MCV: 89.1 fL (ref 80.0–100.0)
MONOS PCT: 7 %
Monocytes Absolute: 0.9 10*3/uL (ref 0.2–1.0)
Monocytes Absolute: 0.7 10*3/uL (ref 0.2–1.0)
Monocytes Relative: 15 %
Neutro Abs: 3.5 10*3/uL (ref 1.4–6.5)
Neutro Abs: 7.8 10*3/uL — ABNORMAL HIGH (ref 1.4–6.5)
Neutrophils Relative %: 57 %
Neutrophils Relative %: 82 %
PLATELETS: 457 10*3/uL — AB (ref 150–440)
Platelets: 535 10*3/uL — ABNORMAL HIGH (ref 150–440)
RBC: 3.59 MIL/uL — AB (ref 4.40–5.90)
RBC: 3.7 MIL/uL — AB (ref 4.40–5.90)
RDW: 14 % (ref 11.5–14.5)
RDW: 14 % (ref 11.5–14.5)
WBC: 6.1 10*3/uL (ref 3.8–10.6)
WBC: 9.7 10*3/uL (ref 3.8–10.6)

## 2018-05-20 LAB — COMPREHENSIVE METABOLIC PANEL
ALBUMIN: 3.2 g/dL — AB (ref 3.5–5.0)
ALK PHOS: 178 U/L — AB (ref 38–126)
ALT: 33 U/L (ref 0–44)
ALT: 31 U/L (ref 0–44)
ANION GAP: 13 (ref 5–15)
AST: 34 U/L (ref 15–41)
AST: 39 U/L (ref 15–41)
Albumin: 3.1 g/dL — ABNORMAL LOW (ref 3.5–5.0)
Alkaline Phosphatase: 179 U/L — ABNORMAL HIGH (ref 38–126)
Anion gap: 11 (ref 5–15)
BILIRUBIN TOTAL: 0.3 mg/dL (ref 0.3–1.2)
BILIRUBIN TOTAL: 0.6 mg/dL (ref 0.3–1.2)
BUN: 19 mg/dL (ref 8–23)
BUN: 20 mg/dL (ref 8–23)
CALCIUM: 9.5 mg/dL (ref 8.9–10.3)
CO2: 26 mmol/L (ref 22–32)
CO2: 20 mmol/L — ABNORMAL LOW (ref 22–32)
Calcium: 8.9 mg/dL (ref 8.9–10.3)
Chloride: 104 mmol/L (ref 98–111)
Chloride: 104 mmol/L (ref 98–111)
Creatinine, Ser: 1.56 mg/dL — ABNORMAL HIGH (ref 0.61–1.24)
Creatinine, Ser: 1.5 mg/dL — ABNORMAL HIGH (ref 0.61–1.24)
GFR calc Af Amer: 49 mL/min — ABNORMAL LOW (ref >60)
GFR calc Af Amer: 52 mL/min — ABNORMAL LOW (ref >60)
GFR calc non Af Amer: 44 mL/min — ABNORMAL LOW (ref >60)
GFR, EST NON AFRICAN AMERICAN: 42 mL/min — AB (ref >60)
GLUCOSE: 144 mg/dL — AB (ref 70–99)
Glucose, Bld: 154 mg/dL — ABNORMAL HIGH (ref 70–99)
POTASSIUM: 4.1 mmol/L (ref 3.5–5.1)
POTASSIUM: 4.5 mmol/L (ref 3.5–5.1)
Sodium: 141 mmol/L (ref 135–145)
Sodium: 137 mmol/L (ref 135–145)
TOTAL PROTEIN: 6.8 g/dL (ref 6.5–8.1)
TOTAL PROTEIN: 6.8 g/dL (ref 6.5–8.1)

## 2018-05-20 LAB — BLOOD GAS, VENOUS
Acid-Base Excess: 0.2 mmol/L (ref 0.0–2.0)
BICARBONATE: 22.9 mmol/L (ref 20.0–28.0)
FIO2: 0.21
O2 Saturation: 89.3 %
PH VEN: 7.49 — AB (ref 7.250–7.430)
Patient temperature: 37
pCO2, Ven: 30 mmHg — ABNORMAL LOW (ref 44.0–60.0)
pO2, Ven: 52 mmHg — ABNORMAL HIGH (ref 32.0–45.0)

## 2018-05-20 LAB — TROPONIN I: Troponin I: <0.03 ng/mL (ref <0.03)

## 2018-05-20 LAB — LACTIC ACID, PLASMA: Lactic Acid, Venous: 1.2 mmol/L (ref 0.5–1.9)

## 2018-05-20 MED ORDER — SODIUM CHLORIDE 0.9 % IV BOLUS (SEPSIS)
1000.0000 mL | Freq: Once | INTRAVENOUS | Status: AC
  Administered 2018-05-20: 1000 mL via INTRAVENOUS

## 2018-05-20 MED ORDER — SODIUM CHLORIDE 0.9 % IV SOLN
2000.0000 mg | Freq: Once | INTRAVENOUS | Status: AC
  Administered 2018-05-20: 2000 mg via INTRAVENOUS
  Filled 2018-05-20: qty 52.6

## 2018-05-20 MED ORDER — SODIUM CHLORIDE 0.9 % IV BOLUS (SEPSIS)
1000.0000 mL | Freq: Once | INTRAVENOUS | Status: AC
  Administered 2018-05-21: 01:00:00 1000 mL via INTRAVENOUS

## 2018-05-20 MED ORDER — SODIUM CHLORIDE 0.9% FLUSH
10.0000 mL | INTRAVENOUS | Status: DC | PRN
  Filled 2018-05-20: qty 10

## 2018-05-20 MED ORDER — SODIUM CHLORIDE 0.9 % IV SOLN
Freq: Once | INTRAVENOUS | Status: AC
  Administered 2018-05-20: 10:00:00 via INTRAVENOUS
  Filled 2018-05-20: qty 1000

## 2018-05-20 MED ORDER — HEPARIN SOD (PORK) LOCK FLUSH 100 UNIT/ML IV SOLN: 500.0000 [IU] | Freq: Once | INTRAVENOUS | Status: DC

## 2018-05-20 MED ORDER — PIPERACILLIN-TAZOBACTAM 3.375 G IVPB 30 MIN
3.3750 g | Freq: Once | INTRAVENOUS | Status: AC
  Administered 2018-05-20: 3.375 g via INTRAVENOUS
  Filled 2018-05-20: qty 50

## 2018-05-20 MED ORDER — PROCHLORPERAZINE MALEATE 10 MG PO TABS
10.0000 mg | ORAL_TABLET | Freq: Once | ORAL | Status: AC
  Administered 2018-05-20: 10 mg via ORAL
  Filled 2018-05-20: qty 1

## 2018-05-20 MED ORDER — VANCOMYCIN HCL 10 G IV SOLR
1750.0000 mg | Freq: Once | INTRAVENOUS | Status: AC
  Administered 2018-05-20: 1750 mg via INTRAVENOUS
  Filled 2018-05-20: qty 1750

## 2018-05-20 NOTE — H&P (Signed)
[facility] [place] NAME: Ian Shaffer    MR#:  [id number]  DATE OF BIRTH:  31-Oct-1946  DATE OF ADMISSION:  05/20/2018  PRIMARY CARE PHYSICIAN: Derinda Late, MD   REQUESTING/REFERRING PHYSICIAN: Mariea Clonts, MD  CHIEF COMPLAINT:   Chief Complaint  Patient presents with  . Fever    HISTORY OF PRESENT ILLNESS:  Ian Shaffer  is a 72 y.o. male who presents with fever.  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  He was recently here with fever as well and took a course of antibiotics.  He developed his fever at that time just after chemo treatment.  He went back again today to resume chemo and got his treatment, at home he developed fever again tonight.  Here in the ED he was also tachycardic, but has no clear source of infection.  Hospitalist were called for admission  PAST MEDICAL HISTORY:   Past Medical History:  Diagnosis Date  . Benign essential hypertension 05/31/2014  . Cancer ([place]) 06/2017   right lung   . Cerebral infarction ([place]) 05/31/2014  . Cerebrovascular disease 05/31/2014  . CKD (chronic kidney disease) stage 3, GFR 30-59 ml/min (HCC) 02/23/2016  . CVA (cerebral infarction)   . Diabetes mellitus without complication ([place])   . Difficult intubation   . DVT (deep venous thrombosis) ([place])   . Esophageal reflux 05/31/2014  . H/O: CVA (cerebrovascular accident)   . Hyperlipidemia   . Hypertension   . ICAO (internal carotid artery occlusion) March 01, 2014   Left  . 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 Pleural effusion 04/23/2017  . Stroke [facility]) April, 4,2015  . Type 2 diabetes mellitus with peripheral neuropathy ([place]) 02/23/2016  . Weakness      PAST SURGICAL HISTORY:   Past Surgical History:  Procedure Laterality Date  . CHEST TUBE INSERTION N/A 06/25/2017   Procedure: EGBTDV CATHETER INSERTION;  Surgeon: Nestor Lewandowsky, MD;  Location: [facility] ORS;  Service: Thoracic;  Laterality: N/A;  .  CHEST TUBE INSERTION Right 08/07/2017   Procedure: REMOVAL OF PLEUR X CATHETER;  Surgeon: Nestor Lewandowsky, MD;  Location: [facility] ORS;  Service: General;  Laterality: Right;  . NASAL SINUS SURGERY  2000  . TONSILLECTOMY    . VIDEO ASSISTED THORACOSCOPY Right 06/25/2017   Procedure: VIDEO ASSISTED THORACOSCOPY WITH TALC PLEURODESIS, CHEST TUBE INSERTION;  Surgeon: Nestor Lewandowsky, MD;  Location: [facility] ORS;  Service: Thoracic;  Laterality: Right;     SOCIAL HISTORY:   Social History   Tobacco Use  . Smoking status: Former Smoker    Types: Pipe    Last attempt to quit: 05/07/1974    Years since quitting: 44.0  . Smokeless tobacco: Never Used  . Tobacco comment: pt states he smoked a pipe a long time ago  Substance Use Topics  . Alcohol use: No     FAMILY HISTORY:   Family History  Problem Relation Age of Onset  . Hypertension Mother   . Hypertension Father      DRUG ALLERGIES:   Allergies  Allergen Reactions  . Sulfa Antibiotics Swelling    Facial swelling No tongue or lips swelling, no difficulty breathing.  . Adhesive [Tape] Itching and Rash  . Amoxicillin Nausea Only and Other (See Comments)    Has patient had a PCN reaction causing immediate rash, facial/tongue/throat swelling, SOB or lightheadedness with hypotension: No Has patient had a PCN reaction causing severe rash involving mucus membranes or  skin necrosis: No Has patient had a PCN reaction that required hospitalization: No Has patient had a PCN reaction occurring within the last 10 years: Yes If all of the above answers are "NO", then may proceed with Cephalosporin use.   . Other Rash    Surgical tape    MEDICATIONS AT HOME:   Prior to Admission medications   Medication Sig Start Date End Date Taking? Authorizing Provider  amLODipine (NORVASC) 5 MG tablet Take 5 mg by mouth daily.   Yes [provider]  aspirin EC 81 MG tablet Take 81 mg by mouth daily.   Yes [provider]  clopidogrel  (PLAVIX) 75 MG tablet Take 75 mg by mouth daily.   Yes [provider]  fluticasone (FLONASE) 50 MCG/ACT nasal spray Place 1 spray into both nostrils daily at 2 PM.    Yes [provider]  liraglutide (VICTOZA) 18 MG/3ML SOPN Inject 1.2 mg into the skin daily.   Yes [provider]  lisinopril (PRINIVIL,ZESTRIL) 10 MG tablet Take 10 mg by mouth daily.   Yes [provider]  metoprolol succinate (TOPROL-XL) 25 MG 24 hr tablet Take 25 mg by mouth daily.   Yes [provider]  Multiple Vitamins-Minerals (CENTRUM SILVER PO) Take 1 tablet by mouth daily.   Yes [provider]  ondansetron (ZOFRAN) 8 MG tablet Take 8 mg by mouth every 8 (eight) hours as needed for nausea or refractory nausea / vomiting.  07/31/17  Yes [provider]  polyethylene glycol powder (GLYCOLAX/MIRALAX) powder Take 17 g by mouth daily as needed (for constipation.).   Yes [provider]  pravastatin (PRAVACHOL) 40 MG tablet Take 40 mg by mouth daily.   Yes [provider]  prochlorperazine (COMPAZINE) 10 MG tablet Take 10 mg by mouth every 8 (eight) hours as needed for vomiting.  07/31/17  Yes [provider]    REVIEW OF SYSTEMS:  Review of Systems  Constitutional: Positive for fever. Negative for chills, malaise/fatigue and weight loss.  HENT: Negative for ear pain, hearing loss and tinnitus.   Eyes: Negative for blurred vision, double vision, pain and redness.  Respiratory: Negative for cough, hemoptysis and shortness of breath.   Cardiovascular: Negative for chest pain, palpitations, orthopnea and leg swelling.  Gastrointestinal: Negative for abdominal pain, constipation, diarrhea, nausea and vomiting.  Genitourinary: Negative for dysuria, frequency and hematuria.  Musculoskeletal: Negative for back pain, joint pain and neck pain.  Skin:       No acne, rash, or lesions  Neurological: Negative for dizziness, tremors, focal weakness  and weakness.  Endo/Heme/Allergies: Negative for polydipsia. Does not bruise/bleed easily.  Psychiatric/Behavioral: Negative for depression. The patient is not nervous/anxious and does not have insomnia.      VITAL SIGNS:   Vitals:   05/20/18 2200 05/20/18 2210 05/20/18 2230 05/20/18 2306  BP: 136/78  (!) 145/80 (!) 146/78  Pulse: 99  92 89  Resp:      Temp:  (!) 100.4 F (38 C)    TempSrc:  Oral    SpO2: 94%  98% 95%  Weight:       Wt Readings from Last 3 Encounters:  05/20/18 88 kg (194 lb)  05/20/18 88.2 kg (194 lb 8 oz)  05/14/18 88.6 kg (195 lb 6.4 oz)    PHYSICAL EXAMINATION:  Physical Exam  Vitals reviewed. Constitutional: He is oriented to person, place, and time. He appears well-developed and well-nourished. No distress.  HENT:  Head: Normocephalic and  atraumatic.  Mouth/Throat: Oropharynx is clear and moist.  Eyes: Pupils are equal, round, and reactive to light. Conjunctivae and EOM are normal. No scleral icterus.  Neck: Normal range of motion. Neck supple. No JVD present. No thyromegaly present.  Cardiovascular: Normal rate, regular rhythm and intact distal pulses. Exam reveals no gallop and no friction rub.  No murmur heard. Respiratory: Effort normal and breath sounds normal. No respiratory distress. He has no wheezes. He has no rales.  GI: Soft. Bowel sounds are normal. He exhibits no distension. There is no tenderness.  Musculoskeletal: Normal range of motion. He exhibits no edema.  No arthritis, no gout  Lymphadenopathy:    He has no cervical adenopathy.  Neurological: He is alert and oriented to person, place, and time. No cranial nerve deficit.  No dysarthria, no aphasia  Skin: Skin is warm and dry. No rash noted. No erythema.  Psychiatric: He has a normal mood and affect. His behavior is normal. Judgment and thought content normal.    LABORATORY PANEL:   CBC Recent Labs  Lab 05/20/18 2151  WBC 9.7  HGB 11.2*  HCT 32.9*  PLT 535*    ------------------------------------------------------------------------------------------------------------------  Chemistries  Recent Labs  Lab 05/20/18 2151  NA 137  K 4.5  CL 104  CO2 20*  GLUCOSE 144*  BUN 20  CREATININE 1.50*  CALCIUM 8.9  AST 39  ALT 31  ALKPHOS 179*  BILITOT 0.6   ------------------------------------------------------------------------------------------------------------------  Cardiac Enzymes Recent Labs  Lab 05/20/18 2151  TROPONINI <0.03   ------------------------------------------------------------------------------------------------------------------  RADIOLOGY:  Dg Chest 2 View  Result Date: 05/20/2018 CLINICAL DATA:  Sepsis and fever to 102. Chemotherapy treatment this morning. History of right lung cancer. EXAM: CHEST - 2 VIEW COMPARISON:  Chest CT 05/06/2018, CXR 05/07/2018 FINDINGS: Right basilar confluent opacities identified with blunting the right lateral costophrenic angle. This is not definitively identified on the lateral view. Findings could reflect a small effusion with adjacent right basilar pulmonary consolidation versus superimposition of right basilar pleural fat. Nodular density on the lateral view anteriorly would be in keeping with the patient's known pulmonary nodule at the right lung base. This measures 13 mm on the lateral view. No aggressive osseous lesions. IMPRESSION: 1. Homogeneous opacity at the right lung base best seen on the frontal view but not clearly confirmed on the lateral projection. Findings could be related to a layering effusion, pulmonary consolidation or combination thereof. Short-term interval follow-up is recommended after treating for pneumonia. 2. Small nodular opacity at the right lung base anteriorly best seen on the lateral view would be in keeping with one of the patient's known pulmonary nodules. Electronically Signed   By: Ashley Royalty M.D.   On: 05/20/2018 23:45    EKG:   Orders placed or  performed during the hospital encounter of 05/20/18  . ED EKG 12-Lead  . ED EKG 12-Lead    IMPRESSION AND PLAN:  Principal Problem:   SIRS (systemic inflammatory response syndrome) (HCC) -IV antibiotics given in the ED, unclear source of infection.  We will continue antibiotics for now, will check a procalcitonin, get an oncology consult as below. Active Problems:   [name], malignant ([place]) -unclear if this is recurrent infection, or perhaps some reaction to chemo?  Oncology consult   Benign essential hypertension -continue home medications   Type 2 diabetes mellitus with peripheral neuropathy (HCC) -sliding scale insulin with corresponding glucose checks   CKD (chronic kidney disease) stage 3, GFR 30-59 ml/min (HCC) -at baseline, avoid nephrotoxins  Esophageal reflux -home dose PPI  Chart review performed and case discussed with ED provider. Labs, imaging and/or ECG reviewed by provider and discussed with patient/family. Management plans discussed with the patient and/or family.  DVT PROPHYLAXIS: SubQ lovenox  GI PROPHYLAXIS: PPI  ADMISSION STATUS: Inpatient  CODE STATUS: Full Code Status History    Date Active Date Inactive Code Status Order ID Comments User Context   05/07/2018 2248 05/12/2018 2006 Full Code [id number]  Harrie Foreman, MD Inpatient   09/09/2017 0242 09/09/2017 1722 Full Code [id number]  Demetrios Loll, MD Inpatient   06/25/2017 1347 06/27/2017 1659 Full Code [id number]  Nestor Lewandowsky, MD Inpatient   04/23/2017 0134 04/23/2017 1657 Full Code [id number]  Hugelmeyer, Ubaldo Glassing, DO Inpatient      TOTAL TIME TAKING CARE OF THIS PATIENT: 45 minutes.   Dhanush Jokerst [place] 05/20/2018, 11:49 PM  [facility]  Office  [phone]  CC: Primary care physician; Derinda Late, MD  Note:  This document was prepared using Dragon voice recognition software and may include unintentional dictation errors.

## 2018-05-20 NOTE — Assessment & Plan Note (Addendum)
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  Tolerated poorly meeting the patient in the hospital because of fevers [see discussion below]  #Proceed with gemcitabine cycle 1 day 8-treatment today.  Labs adequate.  #Feves-currently resolved; however gemcitabine can cause fevers.  This seems to be monitored closely.  #Colitis/secondary to [facility]; status post infliximab infusions x2; most recent June 3rd.  Resolved.  # CKD- stage III creat stable at baseline creatinine of 1.4/stable  # treatment today; in 2 week-gemlabs/MD.

## 2018-05-20 NOTE — ED Triage Notes (Signed)
Pt to ED due to fever of 102F. Pt had chemotherapy treatment this morning. Pt denies N/V. Tylenol last taken 2000.

## 2018-05-20 NOTE — Progress Notes (Signed)
CODE SEPSIS - PHARMACY COMMUNICATION  **Broad Spectrum Antibiotics should be administered within 1 hour of Sepsis diagnosis**  Time Code Sepsis Called/Page Received: 4045  Antibiotics Ordered: vancomycin 1750mg                                       Zosyn 3.375   Time of 1st antibiotic administration: 2210  Additional action taken by pharmacy: N/A  If necessary, Name of Provider/Nurse Contacted: [place] ,PharmD Clinical Pharmacist  05/20/2018  10:25 PM

## 2018-05-20 NOTE — ED Notes (Signed)
Pt had chemo this am.  This afternoon pt started running a fever.  No n/v/d.  No chest pain or sob.   Pt reports similar sx last week.  Pt alert  Family with pt.  Iv started and placed on monitor.

## 2018-05-20 NOTE — ED Notes (Signed)
Pt alert  ivs infusing  Family with pt   Pt waiting for admission

## 2018-05-20 NOTE — Progress Notes (Signed)
[facility] OFFICE PROGRESS NOTE  Patient Care Team: Derinda Late, MD as PCP - General (Family Medicine) Margaretha Sheffield, MD (Otolaryngology) Telford Nab, RN as Registered Nurse Nestor Lewandowsky, MD as Referring Physician (Cardiothoracic Surgery) Cammie Sickle, MD as Consulting Physician (Internal Medicine)  Cancer Staging No matching staging information was found for the patient.   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   # JUNE 2019- GEM  # Hx of CVA Right hand; no residual deficits [left sided carotid block; no surgery done]- on Asa/plavix; CKD- stage III; DM-2; ? PN  # MOLECULAR TESTING- PDL-1- 1% [LOW]; [facility]- No actionable**  -----------------------------------   DIAGNOSIS: [AUG 2019 ] ? [name]   STAGE:  IV  ;GOALS: Palliative  CURRENT/MOST RECENT THERAPY- June 2019- GEM     [name], malignant ([place])   03/19/2018 -  Chemotherapy    The patient had gemcitabine (GEMZAR) 2,000 mg in sodium chloride 0.9 % 250 mL chemo infusion, 2,128 mg, Intravenous,  Once, 1 of 4 cycles Administration: 2,000 mg (05/07/2018), 2,000 mg (05/20/2018)  for chemotherapy treatment.          INTERVAL HISTORY:  Ian Shaffer 72 y.o.  male pleasant  patient above history of mesothelioma status post cycle 1 day 1 of gemcitabine is here for follow-up.  Unfortunately patient was admitted to hospital for fevers up to 103 after gemcitabine infusion.  Patient had extensive infectious work-up negative for infection in the hospital.  Patient was discharged on antibiotics.  He noted to have a skin rash.  At the last visit with Dr. Rao-chemotherapy was held.  Patient is here to proceed with cycle number 1 "day 8" gemcitabine.  He denies any fevers or chills.  Overall feels good.  No diarrhea.  Review of Systems  Constitutional: Negative for chills, diaphoresis, fever, malaise/fatigue and weight loss.  HENT: Negative for nosebleeds and sore throat.   Eyes: Negative for double vision.  Respiratory: Negative for cough, hemoptysis, sputum production, shortness of breath and wheezing.   Cardiovascular: Negative for chest pain, palpitations, orthopnea and leg swelling.  Gastrointestinal: Negative for abdominal pain, blood in stool, constipation, diarrhea, heartburn, melena, nausea and vomiting.  Genitourinary: Negative for dysuria, frequency and urgency.  Musculoskeletal: Negative for back pain and joint pain.  Skin: Negative.  Negative for itching and rash.  Neurological: Negative for dizziness, tingling, focal weakness, weakness and headaches.  Endo/Heme/Allergies: Does not bruise/bleed easily.  Psychiatric/Behavioral: Negative for depression. The patient is not nervous/anxious and does not have insomnia.       PAST MEDICAL HISTORY :  Past Medical History:  Diagnosis Date  . Benign essential hypertension 05/31/2014  . Cancer ([place]) 06/2017   right lung   . Cerebral infarction ([place]) 05/31/2014  . Cerebrovascular disease 05/31/2014  . CKD (chronic kidney disease) stage 3, GFR 30-59 ml/min (HCC) 02/23/2016  . CVA (cerebral infarction)   .  Diabetes mellitus without complication ([place])   . Difficult intubation   . DVT (deep venous thrombosis) ([place])   .  Esophageal reflux 05/31/2014  . H/O: CVA (cerebrovascular accident)   . Hyperlipidemia   . Hypertension   . ICAO (internal carotid artery occlusion) March 01, 2014   Left  . 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 Pleural effusion 04/23/2017  . Stroke [facility]) April, 4,2015  . Type 2 diabetes mellitus with peripheral neuropathy ([place]) 02/23/2016  . Weakness     PAST SURGICAL HISTORY :   Past Surgical History:  Procedure Laterality Date  . CHEST TUBE INSERTION N/A 06/25/2017   Procedure: KDTOIZ CATHETER INSERTION;  Surgeon: Nestor Lewandowsky, MD;  Location: [facility] ORS;  Service: Thoracic;  Laterality: N/A;  . CHEST TUBE INSERTION Right 08/07/2017   Procedure: REMOVAL OF PLEUR X CATHETER;  Surgeon: Nestor Lewandowsky, MD;  Location: [facility] ORS;  Service: General;  Laterality: Right;  . NASAL SINUS SURGERY  2000  . TONSILLECTOMY    . VIDEO ASSISTED THORACOSCOPY Right 06/25/2017   Procedure: VIDEO ASSISTED THORACOSCOPY WITH TALC PLEURODESIS, CHEST TUBE INSERTION;  Surgeon: Nestor Lewandowsky, MD;  Location: [facility] ORS;  Service: Thoracic;  Laterality: Right;    FAMILY HISTORY :   Family History  Problem Relation Age of Onset  . Hypertension Mother   . Hypertension Father     SOCIAL HISTORY:   Social History   Tobacco Use  . Smoking status: Former Smoker    Types: Pipe    Last attempt to quit: 05/07/1974    Years since quitting: 44.0  . Smokeless tobacco: Never Used  . Tobacco comment: pt states he smoked a pipe a long time ago  Substance Use Topics  . Alcohol use: No  . Drug use: No    ALLERGIES:  is allergic to sulfa antibiotics; adhesive [tape]; amoxicillin; and other.  MEDICATIONS:  Current Outpatient Medications  Medication Sig Dispense Refill  . aspirin EC 81 MG tablet Take 81 mg by mouth daily.    . clopidogrel (PLAVIX) 75 MG tablet Take 75 mg by mouth daily.    . fluticasone (FLONASE) 50 MCG/ACT nasal spray Place 1 spray into both nostrils daily at 2 PM.     .  liraglutide (VICTOZA) 18 MG/3ML SOPN Inject 1.2 mg into the skin daily.    . metoprolol succinate (TOPROL-XL) 25 MG 24 hr tablet Take 25 mg by mouth daily.    . Multiple Vitamins-Minerals (CENTRUM SILVER PO) Take 1 tablet by mouth daily.    . ondansetron (ZOFRAN) 8 MG tablet Take 8 mg by mouth every 8 (eight) hours as needed for nausea or refractory nausea / vomiting.     . polyethylene glycol powder (GLYCOLAX/MIRALAX) powder Take 17 g by mouth daily as needed (for constipation.).    Marland Kitchen pravastatin (PRAVACHOL) 40 MG tablet Take 40 mg by mouth daily.    . prochlorperazine (COMPAZINE) 10 MG tablet Take 10 mg by mouth every 8 (eight) hours as needed for vomiting.     Marland Kitchen acetaminophen (TYLENOL) 325 MG tablet Take 2 tablets (650 mg total) by mouth every 6 (six) hours as needed for mild pain (or Fever >/= 101).    Marland Kitchen levofloxacin (LEVAQUIN) 500 MG tablet Take 1 tablet (500 mg total) by mouth daily for 5 days. 5 tablet 0  . triamcinolone cream (KENALOG) 0.1 % Apply topically 2 (two) times daily. 30 g 0   No current facility-administered medications for this visit.  PHYSICAL EXAMINATION: ECOG PERFORMANCE STATUS: 1 - Symptomatic but completely ambulatory  BP (!) 144/79 (BP Location: Right Arm, Patient Position: Sitting)   Pulse 71   Temp 98.5 F (36.9 C) (Oral)   Resp 18   Ht 5' 10"  (1.778 m)   Wt 194 lb 8 oz (88.2 kg)   BMI 27.91 kg/m   Filed Weights   05/20/18 0835  Weight: 194 lb 8 oz (88.2 kg)    GENERAL: Well-nourished well-developed; Alert, no distress and comfortable. /Accompanied by family.  EYES: no pallor or icterus OROPHARYNX: no thrush or ulceration; NECK: supple; no lymph nodes felt. LYMPH:  no palpable lymphadenopathy in the axillary or inguinal regions LUNGS: Decreased breath sounds auscultation bilaterally. No wheeze or crackles HEART/CVS: regular rate & rhythm and no murmurs; No lower extremity edema ABDOMEN:abdomen soft, non-tender and normal bowel sounds. No  hepatomegaly or splenomegaly.  Musculoskeletal:no cyanosis of digits and no clubbing  PSYCH: alert & oriented x 3 with fluent speech NEURO: no focal motor/sensory deficits SKIN:  no rashes or significant lesions    LABORATORY DATA:  I have reviewed the data as listed    Component Value Date/Time   NA 138 05/21/2018 0500   K 4.0 05/21/2018 0500   CL 109 05/21/2018 0500   CO2 20 (L) 05/21/2018 0500   GLUCOSE 143 (H) 05/21/2018 0500   BUN 18 05/21/2018 0500   CREATININE 1.55 (H) 05/21/2018 0500   CALCIUM 8.1 (L) 05/21/2018 0500   PROT 6.8 05/20/2018 2151   ALBUMIN 3.2 (L) 05/20/2018 2151   AST 39 05/20/2018 2151   ALT 31 05/20/2018 2151   ALKPHOS 179 (H) 05/20/2018 2151   BILITOT 0.6 05/20/2018 2151   GFRNONAA 43 (L) 05/21/2018 0500   GFRAA 50 (L) 05/21/2018 0500    No results found for: SPEP, UPEP  Lab Results  Component Value Date   WBC 6.4 05/21/2018   NEUTROABS 7.8 (H) 05/20/2018   HGB 9.7 (L) 05/21/2018   HCT 27.7 (L) 05/21/2018   MCV 89.3 05/21/2018   PLT 430 05/21/2018      Chemistry      Component Value Date/Time   NA 138 05/21/2018 0500   K 4.0 05/21/2018 0500   CL 109 05/21/2018 0500   CO2 20 (L) 05/21/2018 0500   BUN 18 05/21/2018 0500   CREATININE 1.55 (H) 05/21/2018 0500      Component Value Date/Time   CALCIUM 8.1 (L) 05/21/2018 0500   ALKPHOS 179 (H) 05/20/2018 2151   AST 39 05/20/2018 2151   ALT 31 05/20/2018 2151   BILITOT 0.6 05/20/2018 2151       RADIOGRAPHIC STUDIES: I have personally reviewed the radiological images as listed and agreed with the findings in the report. No results found.   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  Tolerated poorly meeting the patient in the hospital because of fevers [see discussion below]  #Proceed with gemcitabine cycle 1 day 8-treatment today.  Labs adequate.  #Feves-currently resolved; however  gemcitabine can cause fevers.  This seems to be monitored closely.  #Colitis/secondary to [facility]; status post infliximab infusions x2; most recent June 3rd.  Resolved.  # CKD- stage III creat stable at baseline creatinine of 1.4/stable  # treatment today; in 2 week-gemlabs/MD.    Orders Placed This Encounter  Procedures  . CBC with Differential/Platelet    Standing Status:   Future    Standing Expiration Date:  05/21/2019  . Comprehensive metabolic panel    Standing Status:   Future    Standing Expiration Date:   05/21/2019   All questions were answered. The patient knows to call the clinic with any problems, questions or concerns.      Cammie Sickle, MD 05/24/2018 6:24 PM

## 2018-05-21 ENCOUNTER — Other Ambulatory Visit: Payer: Self-pay

## 2018-05-21 ENCOUNTER — Ambulatory Visit: Payer: Medicare Other

## 2018-05-21 ENCOUNTER — Other Ambulatory Visit: Payer: Medicare Other

## 2018-05-21 ENCOUNTER — Ambulatory Visit: Payer: Medicare Other | Admitting: Internal Medicine

## 2018-05-21 DIAGNOSIS — R5081 Fever presenting with conditions classified elsewhere: Secondary | ICD-10-CM

## 2018-05-21 DIAGNOSIS — Z882 Allergy status to sulfonamides status: Secondary | ICD-10-CM

## 2018-05-21 DIAGNOSIS — Z87891 Personal history of nicotine dependence: Secondary | ICD-10-CM

## 2018-05-21 DIAGNOSIS — C459 Mesothelioma, unspecified: Secondary | ICD-10-CM

## 2018-05-21 DIAGNOSIS — Z881 Allergy status to other antibiotic agents status: Secondary | ICD-10-CM

## 2018-05-21 LAB — BASIC METABOLIC PANEL
Anion gap: 9 (ref 5–15)
BUN: 18 mg/dL (ref 8–23)
CALCIUM: 8.1 mg/dL — AB (ref 8.9–10.3)
CO2: 20 mmol/L — AB (ref 22–32)
Chloride: 109 mmol/L (ref 98–111)
Creatinine, Ser: 1.55 mg/dL — ABNORMAL HIGH (ref 0.61–1.24)
GFR calc non Af Amer: 43 mL/min — ABNORMAL LOW (ref >60)
GFR, EST AFRICAN AMERICAN: 50 mL/min — AB (ref >60)
Glucose, Bld: 143 mg/dL — ABNORMAL HIGH (ref 70–99)
Potassium: 4 mmol/L (ref 3.5–5.1)
SODIUM: 138 mmol/L (ref 135–145)

## 2018-05-21 LAB — GLUCOSE, CAPILLARY
GLUCOSE-CAPILLARY: 126 mg/dL — AB (ref 70–99)
GLUCOSE-CAPILLARY: 103 mg/dL — AB (ref 70–99)
GLUCOSE-CAPILLARY: 121 mg/dL — AB (ref 70–99)
Glucose-Capillary: 115 mg/dL — ABNORMAL HIGH (ref 70–99)

## 2018-05-21 LAB — CBC
HCT: 27.7 % — ABNORMAL LOW (ref 40.0–52.0)
HEMOGLOBIN: 9.7 g/dL — AB (ref 13.0–18.0)
MCH: 31.1 pg (ref 26.0–34.0)
MCHC: 34.9 g/dL (ref 32.0–36.0)
MCV: 89.3 fL (ref 80.0–100.0)
Platelets: 430 10*3/uL (ref 150–440)
RBC: 3.11 MIL/uL — AB (ref 4.40–5.90)
RDW: 14.1 % (ref 11.5–14.5)
WBC: 6.4 10*3/uL (ref 3.8–10.6)

## 2018-05-21 LAB — LACTIC ACID, PLASMA: Lactic Acid, Venous: 1.6 mmol/L (ref 0.5–1.9)

## 2018-05-21 MED ORDER — SODIUM CHLORIDE 0.9 % IV SOLN
2.0000 g | Freq: Two times a day (BID) | INTRAVENOUS | Status: DC
  Administered 2018-05-22: 05:00:00 2 g via INTRAVENOUS
  Filled 2018-05-21 (×2): qty 2

## 2018-05-21 MED ORDER — ACETAMINOPHEN 650 MG RE SUPP: 650.0000 mg | Freq: Four times a day (QID) | RECTAL | Status: DC | PRN

## 2018-05-21 MED ORDER — ENOXAPARIN SODIUM 40 MG/0.4ML ~~LOC~~ SOLN: 40.0000 mg | SUBCUTANEOUS | Status: DC

## 2018-05-21 MED ORDER — ONDANSETRON HCL 4 MG PO TABS: 4.0000 mg | ORAL_TABLET | Freq: Four times a day (QID) | ORAL | Status: DC | PRN

## 2018-05-21 MED ORDER — VANCOMYCIN HCL 10 G IV SOLR
1250.0000 mg | INTRAVENOUS | Status: DC
  Filled 2018-05-21: qty 1250

## 2018-05-21 MED ORDER — INSULIN ASPART 100 UNIT/ML ~~LOC~~ SOLN: 0.0000 [IU] | Freq: Every day | SUBCUTANEOUS | Status: DC

## 2018-05-21 MED ORDER — PRAVASTATIN SODIUM 20 MG PO TABS
40.0000 mg | ORAL_TABLET | Freq: Every day | ORAL | Status: DC
  Administered 2018-05-21 – 2018-05-22 (×2): 40 mg via ORAL
  Filled 2018-05-21 (×2): qty 2

## 2018-05-21 MED ORDER — POLYETHYLENE GLYCOL 3350 17 GM/SCOOP PO POWD
17.0000 g | Freq: Every day | ORAL | Status: DC | PRN
  Filled 2018-05-21: qty 255

## 2018-05-21 MED ORDER — CLOPIDOGREL BISULFATE 75 MG PO TABS
75.0000 mg | ORAL_TABLET | Freq: Every day | ORAL | Status: DC
  Administered 2018-05-21: 16:00:00 75 mg via ORAL
  Filled 2018-05-21 (×3): qty 1

## 2018-05-21 MED ORDER — INSULIN ASPART 100 UNIT/ML ~~LOC~~ SOLN
0.0000 [IU] | Freq: Three times a day (TID) | SUBCUTANEOUS | Status: DC
  Administered 2018-05-21: 1 [IU] via SUBCUTANEOUS
  Filled 2018-05-21: qty 1

## 2018-05-21 MED ORDER — ONDANSETRON HCL 4 MG/2ML IJ SOLN
4.0000 mg | Freq: Four times a day (QID) | INTRAMUSCULAR | Status: DC | PRN
Start: 2018-05-21 — End: 2018-05-22

## 2018-05-21 MED ORDER — ACETAMINOPHEN 325 MG PO TABS
650.0000 mg | ORAL_TABLET | Freq: Four times a day (QID) | ORAL | Status: DC | PRN
  Administered 2018-05-21: 06:00:00 650 mg via ORAL
  Filled 2018-05-21: qty 2

## 2018-05-21 MED ORDER — FLUTICASONE PROPIONATE 50 MCG/ACT NA SUSP
1.0000 | Freq: Every day | NASAL | Status: DC
  Administered 2018-05-21 – 2018-05-22 (×2): 1 via NASAL
  Filled 2018-05-21: qty 16

## 2018-05-21 MED ORDER — ASPIRIN EC 81 MG PO TBEC
81.0000 mg | DELAYED_RELEASE_TABLET | Freq: Every day | ORAL | Status: DC
  Administered 2018-05-21 – 2018-05-22 (×2): 81 mg via ORAL
  Filled 2018-05-21 (×2): qty 1

## 2018-05-21 MED ORDER — VANCOMYCIN HCL IN DEXTROSE 1-5 GM/200ML-% IV SOLN
1000.0000 mg | INTRAVENOUS | Status: DC
  Administered 2018-05-21 – 2018-05-22 (×2): 1000 mg via INTRAVENOUS
  Filled 2018-05-21 (×5): qty 200

## 2018-05-21 MED ORDER — POLYETHYLENE GLYCOL 3350 17 G PO PACK: 17.0000 g | PACK | Freq: Every day | ORAL | Status: DC | PRN

## 2018-05-21 NOTE — Plan of Care (Signed)
  Problem: Education: Goal: Knowledge of General Education information will improve Outcome: Progressing   Problem: Health Behavior/Discharge Planning: Goal: Ability to manage health-related needs will improve Outcome: Progressing   

## 2018-05-21 NOTE — Consult Note (Signed)
[place] CONSULT NOTE  Patient Care Team: Derinda Late, MD as PCP - General (Family Medicine) Margaretha Sheffield, MD (Otolaryngology) Telford Nab, RN as Registered Nurse Nestor Lewandowsky, MD as Referring Physician (Cardiothoracic Surgery) Cammie Sickle, MD as Consulting Physician (Internal Medicine)  CHIEF COMPLAINTS/PURPOSE OF CONSULTATION:  Fever on chemotherapy  HISTORY OF PRESENTING ILLNESS:  Ian Shaffer 72 y.o.  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  Patient had a similar episode of fever approximately 1 week ago with cycle number 1 day 1 of gemcitabine when he was admitted to the hospital.  Infectious work-up was negative discharged home on doxycycline.  Patient was seen yesterday in the clinic and resumed cycle 1 day 8 gemcitabine.  Unfortunately when patient reached home the evening patient had episode of chills/rigors and noted to have temperature up to 104.  Patient feel poorly.  He was admitted to hospital for further work-up and evaluation.  Patient had episode of 10 2 in the morning.  Otherwise he has been feeling okay.  Denies any swelling in the legs denies any skin rash.  ROS: A complete 10 point review of system is done which is negative except mentioned above in history of present illness  MEDICAL HISTORY:  Past Medical History:  Diagnosis Date  . Benign essential hypertension 05/31/2014  . Cancer ([place]) 06/2017   right lung   . Cerebral infarction ([place]) 05/31/2014  . Cerebrovascular disease 05/31/2014  . CKD (chronic kidney disease) stage 3, GFR 30-59 ml/min ([facility]) 02/23/2016  . CVA (cerebral infarction)   . Diabetes mellitus without complication ([place])   . Difficult intubation   . DVT (deep venous thrombosis) ([place])   . Esophageal reflux 05/31/2014  . H/O: CVA (cerebrovascular accident)   . Hyperlipidemia   . Hypertension   . 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 Pleural effusion 04/23/2017  . Stroke [facility]) April, 4,2015  . Type 2 diabetes mellitus with peripheral neuropathy ([place]) 02/23/2016  . Weakness     SURGICAL HISTORY: Past Surgical History:  Procedure Laterality Date  . CHEST TUBE INSERTION N/A 06/25/2017   Procedure: RKYHCW CATHETER INSERTION;  Surgeon: Nestor Lewandowsky, MD;  Location: [facility] ORS;  Service: Thoracic;  Laterality: N/A;  . CHEST TUBE INSERTION Right 08/07/2017   Procedure: REMOVAL OF PLEUR X CATHETER;  Surgeon: Nestor Lewandowsky, MD;  Location: [facility] ORS;  Service: General;  Laterality: Right;  . NASAL SINUS SURGERY  2000  . TONSILLECTOMY    . VIDEO ASSISTED THORACOSCOPY Right 06/25/2017   Procedure: VIDEO ASSISTED THORACOSCOPY WITH TALC PLEURODESIS, CHEST TUBE INSERTION;  Surgeon: Nestor Lewandowsky, MD;  Location: [facility] ORS;  Service: Thoracic;  Laterality: Right;    SOCIAL HISTORY: Social History   Socioeconomic History  . Marital status: Married    Spouse name: Vaughan Basta  . Number of children: Not on file  . Years of education: Not on file  . Highest education level: Not on file  Occupational History  . Not on file  Social Needs  . Financial resource strain: Not hard at all  . Food insecurity:    Worry: Patient refused    Inability: Patient refused  . Transportation needs:    Medical: Patient refused    Non-medical: Patient refused  Tobacco Use  . Smoking status: Former Smoker    Types: Pipe    Last attempt  to quit: 05/07/1974    Years since quitting: 44.0  . Smokeless tobacco: Never Used  . Tobacco comment: pt states he smoked a pipe a long time ago  Substance and Sexual Activity  . Alcohol use: No  . Drug use: No  . Sexual activity: Not Currently  Lifestyle  . Physical activity:    Days per week: Patient refused    Minutes per session: Patient refused  . Stress: Only a little  Relationships  . Social connections:     Talks on phone: Patient refused    Gets together: Patient refused    Attends religious service: Patient refused    Active member of club or organization: Patient refused    Attends meetings of clubs or organizations: Patient refused    Relationship status: Patient refused  . Intimate partner violence:    Fear of current or ex partner: Patient refused    Emotionally abused: Patient refused    Physically abused: Patient refused    Forced sexual activity: Patient refused  Other Topics Concern  . Not on file  Social History Narrative  . Not on file    FAMILY HISTORY: Family History  Problem Relation Age of Onset  . Hypertension Mother   . Hypertension Father     ALLERGIES:  is allergic to sulfa antibiotics; adhesive [tape]; amoxicillin; and other.  MEDICATIONS:  Current Facility-Administered Medications  Medication Dose Route Frequency Provider Last Rate Last Dose  . acetaminophen (TYLENOL) tablet 650 mg  650 mg Oral Q6H PRN Loletha Grayer, MD   650 mg at 05/21/18 8786   Or  . acetaminophen (TYLENOL) suppository 650 mg  650 mg Rectal Q6H PRN Loletha Grayer, MD      . aspirin EC tablet 81 mg  81 mg Oral Daily Loletha Grayer, MD   81 mg at 05/21/18 1609  . [START ON 05/22/2018] ceFEPIme (MAXIPIME) 2 g in sodium chloride 0.9 % 100 mL IVPB  2 g Intravenous Q12H Wieting, Richard, MD      . clopidogrel (PLAVIX) tablet 75 mg  75 mg Oral Daily Loletha Grayer, MD   75 mg at 05/21/18 1609  . enoxaparin (LOVENOX) injection 40 mg  40 mg Subcutaneous Q24H Wieting, Richard, MD      . fluticasone (FLONASE) 50 MCG/ACT nasal spray 1 spray  1 spray Each Nare Q1400 Wieting, Richard, MD      . insulin aspart (novoLOG) injection 0-5 Units  0-5 Units Subcutaneous QHS Wieting, Richard, MD      . insulin aspart (novoLOG) injection 0-9 Units  0-9 Units Subcutaneous TID WC Loletha Grayer, MD   1 Units at 05/21/18 0805  . ondansetron (ZOFRAN) tablet 4 mg  4 mg Oral Q6H PRN Loletha Grayer, MD        Or  . ondansetron (ZOFRAN) injection 4 mg  4 mg Intravenous Q6H PRN Wieting, Richard, MD      . polyethylene glycol (MIRALAX / GLYCOLAX) packet 17 g  17 g Oral Daily PRN Wieting, Richard, MD      . pravastatin (PRAVACHOL) tablet 40 mg  40 mg Oral Daily Loletha Grayer, MD   40 mg at 05/21/18 1609  . vancomycin (VANCOCIN) IVPB 1000 mg/200 mL premix  1,000 mg Intravenous Q18H Loletha Grayer, MD   Stopped at 05/21/18 1258      .  PHYSICAL EXAMINATION:  Vitals:   05/21/18 1537 05/21/18 1604  BP: (!) 105/57   Pulse: 78   Resp: 18   Temp: 100.3 F (37.9  C) 99.9 F (37.7 C)  SpO2: 99%    Filed Weights   05/20/18 2125 05/21/18 0052  Weight: 194 lb (88 kg) 192 lb 9.6 oz (87.4 kg)    GENERAL: Well-nourished well-developed; Alert, no distress and comfortable.   Accompanied by his wife. EYES: no pallor or icterus OROPHARYNX: no thrush or ulceration. NECK: supple, no masses felt LYMPH:  no palpable lymphadenopathy in the cervical, axillary or inguinal regions LUNGS: decreased breath sounds to auscultation at bases and  No wheeze or crackles HEART/CVS: regular rate & rhythm and no murmurs; No lower extremity edema ABDOMEN: abdomen soft, non-tender and normal bowel sounds Musculoskeletal:no cyanosis of digits and no clubbing  PSYCH: alert & oriented x 3 with fluent speech NEURO: no focal motor/sensory deficits SKIN:  no rashes or significant lesions  LABORATORY DATA:  I have reviewed the data as listed Lab Results  Component Value Date   WBC 6.4 05/21/2018   HGB 9.7 (L) 05/21/2018   HCT 27.7 (L) 05/21/2018   MCV 89.3 05/21/2018   PLT 430 05/21/2018   Recent Labs    05/07/18 1015  05/14/18 0847 05/20/18 0821 05/20/18 2151 05/21/18 0500  NA  --    < > 138 141 137 138  K  --    < > 3.9 4.1 4.5 4.0  CL  --    < > 106 104 104 109  CO2  --    < > 24 26 20* 20*  GLUCOSE  --    < > 133* 154* 144* 143*  BUN  --    < > 17 19 20 18   CREATININE  --    < > 1.49* 1.56* 1.50*  1.55*  CALCIUM  --    < > 8.5* 9.5 8.9 8.1*  GFRNONAA  --    < > 45* 42* 44* 43*  GFRAA  --    < > 52* 49* 52* 50*  PROT 6.6   < > 5.9* 6.8 6.8  --   ALBUMIN 3.3*   < > 2.9* 3.1* 3.2*  --   AST 33   < > 53* 34 39  --   ALT 33   < > 81* 33 31  --   ALKPHOS 110   < > 186* 178* 179*  --   BILITOT 0.8   < > 0.5 0.3 0.6  --   BILIDIR <0.1*  --   --   --   --   --   IBILI NOT CALCULATED  --   --   --   --   --    < > = values in this interval not displayed.    RADIOGRAPHIC STUDIES: I have personally reviewed the radiological images as listed and agreed with the findings in the report. Dg Chest 2 View  Result Date: 05/20/2018 CLINICAL DATA:  Sepsis and fever to 102. Chemotherapy treatment this morning. History of right lung cancer. EXAM: CHEST - 2 VIEW COMPARISON:  Chest CT 05/06/2018, CXR 05/07/2018 FINDINGS: Right basilar confluent opacities identified with blunting the right lateral costophrenic angle. This is not definitively identified on the lateral view. Findings could reflect a small effusion with adjacent right basilar pulmonary consolidation versus superimposition of right basilar pleural fat. Nodular density on the lateral view anteriorly would be in keeping with the patient's known pulmonary nodule at the right lung base. This measures 13 mm on the lateral view. No aggressive osseous lesions. IMPRESSION: 1. Homogeneous opacity at the right lung base  best seen on the frontal view but not clearly confirmed on the lateral projection. Findings could be related to a layering effusion, pulmonary consolidation or combination thereof. Short-term interval follow-up is recommended after treating for pneumonia. 2. Small nodular opacity at the right lung base anteriorly best seen on the lateral view would be in keeping with one of the patient's known pulmonary nodules. Electronically Signed   By: Ashley Royalty M.D.   On: 05/20/2018 23:45   Ct Chest Wo Contrast  Result Date: 05/06/2018 CLINICAL DATA:   72 year old male with history of metastatic unresectable mesothelioma. Follow-up study. EXAM: CT CHEST WITHOUT CONTRAST TECHNIQUE: Multidetector CT imaging of the chest was performed following the standard protocol without IV contrast. COMPARISON:  Chest CT 02/07/2018. FINDINGS: Cardiovascular: Heart size is normal. There is no significant pericardial fluid, thickening or pericardial calcification. There is aortic atherosclerosis, as well as atherosclerosis of the great vessels of the mediastinum and the coronary arteries, including calcified atherosclerotic plaque in the left anterior descending, left circumflex and right coronary arteries. Mediastinum/Nodes: No pathologically enlarged mediastinal or hilar lymph nodes. Please note that accurate exclusion of hilar adenopathy is limited on noncontrast CT scans. Esophagus is unremarkable in appearance. No axillary lymphadenopathy. Lungs/Pleura: Previously noted pleural-based mass in the inferior aspect of the right hemithorax appears grossly less bulky than the prior study measuring approximately 4.9 x 2.7 cm on today's examination (axial image 111 of series 2). Some adjacent pleural thickening and trace volume of right-sided pleural fluid. Some other calcified pleural plaques are noted in the right hemithorax. Previously noted left lower lobe pulmonary nodule has significantly increased in size, currently measuring 15 mm in diameter (axial image 77 of series 3) as compared with 6 mm on prior study from 01/28/2018. Likewise, a right middle lobe nodule which was previously only 2 mm in size currently measures 10 x 8 mm (axial image 99 of series 3). 7 mm nodule in the periphery of the right upper lobe (axial image 68 of series 3) increased from 2 mm on the prior examination. Several other smaller 2-3 mm pulmonary nodules are noted in the lungs, stable compared to prior examinations and nonspecific but likely benign. No consolidative airspace disease. Upper Abdomen:  Aortic atherosclerosis. Extension of right pleural base mass into right lobe of the liver is again noted, with clear progression compared to the prior examination. The area of hepatic involvement now encompassing approximately 7.9 x 11.2 cm (axial image 123 of series 2) involving portions of segments 6, 7 and 8. Musculoskeletal: There are no aggressive appearing lytic or blastic lesions noted in the visualized portions of the skeleton. IMPRESSION: 1. Mixed response to therapy. Specifically, while the primary lesion in the base of the right hemithorax has decreased in size, direct extension into the right lobe of the liver, as well as developing multifocal pulmonary metastases is noted on today's examination. 2. Aortic atherosclerosis, in addition to three-vessel coronary artery disease. Assessment for potential risk factor modification, dietary therapy or pharmacologic therapy may be warranted, if clinically indicated. Aortic Atherosclerosis (ICD10-I70.0). Electronically Signed   By: Vinnie Langton M.D.   On: 05/06/2018 13:20   Dg Chest Port 1 View  Result Date: 05/07/2018 CLINICAL DATA:  Fever and shortness of breath EXAM: PORTABLE CHEST 1 VIEW COMPARISON:  September 08, 2017 FINDINGS: The heart size and mediastinal contours are within normal limits. Both lungs are clear. The visualized skeletal structures are unremarkable. IMPRESSION: No active disease. Electronically Signed   By: Dorise Bullion III  M.D   On: 05/07/2018 21:17    ASSESSMENT & PLAN:   #72 year old male patient with history of mesothelioma currently on gemcitabine is currently admitted the hospital for fever  #Fevers-unclear etiology; infectious work-up so far negative/pending.  Gemcitabine induced/drug fever is a possibility.  Agree with broad-spectrum antibiotics for now.   #[name]-unresectable; most recently on gemcitabine-complicated by fevers..  This is drug-induced fever; would likely recommend switching to alternative  chemotherapy.   #Disposition-patient continues to be afebrile/infectious work-up is negative patient will be discharged in the morning tomorrow.  Above plan of care was discussed with the patient/family and also with Dr. Bobbye Charleston.   Thank you Dr. Leslye Peer for allowing me to participate in the care of your pleasant patient. Please do not hesitate to contact me with questions or concerns in the interim.  All questions were answered. The patient knows to call the clinic with any problems, questions or concerns.    Cammie Sickle, MD 05/21/2018 4:22 PM

## 2018-05-21 NOTE — ED Provider Notes (Addendum)
[facility] Emergency Department Provider Note  ____________________________________________  Time seen: Approximately 12:08 AM  I have reviewed the triage vital signs and the nursing notes.   HISTORY  Chief Complaint Fever    HPI Ian Shaffer is a 73 y.o. 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  The patient was admitted to the hospital and discharged 05/12/2018 after admission for sepsis with fever up to 104; his cultures were reassuring, and he underwent echocardiogram that did not show evidence of endocarditis.  He was discharged on doxycycline and completed his course yesterday.  Today, the patient did have a temperature to 102.0 at home.  He has not had any symptoms, including cough or cold symptoms, congestion or rhinorrhea, sore throat, ear pain, shortness of breath, nausea vomiting or diarrhea, abdominal pain.  He took 500 mg of Tylenol at 6 PM, and 500 mg of Tylenol at 8 PM with improvement but not resolution of his temperature.  Past Medical History:  Diagnosis Date  . Benign essential hypertension 05/31/2014  . Cancer ([place]) 06/2017   right lung   . Cerebral infarction ([place]) 05/31/2014  . Cerebrovascular disease 05/31/2014  . CKD (chronic kidney disease) stage 3, GFR 30-59 ml/min (HCC) 02/23/2016  . CVA (cerebral infarction)   . Diabetes mellitus without complication ([place])   . Difficult intubation   . DVT (deep venous thrombosis) ([place])   . Esophageal reflux 05/31/2014  . H/O: CVA (cerebrovascular accident)   . Hyperlipidemia   . Hypertension   . ICAO (internal carotid artery occlusion) March 01, 2014   Left  . 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 Pleural effusion 04/23/2017  . Stroke [facility]) April, 4,2015  . Type 2 diabetes mellitus with peripheral neuropathy ([place]) 02/23/2016  . Weakness     Patient Active Problem List   Diagnosis Date Noted  . Malnutrition of moderate degree  05/09/2018  . SIRS (systemic inflammatory response syndrome) ([place]) 05/07/2018  . Colitis, acute 04/15/2018  . Goals of care, counseling/discussion 02/28/2018  . Chemotherapy induced neutropenia ([place]) 09/17/2017  . Fever 09/08/2017  . 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 Lung mass 06/25/2017  . 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 Pleural effusion 04/23/2017  . CKD (chronic kidney disease) stage 3, GFR 30-59 ml/min (HCC) 02/23/2016  . Type 2 diabetes mellitus with peripheral neuropathy ([place]) 02/23/2016  . Cerebrovascular disease 05/31/2014  . Cerebral infarction ([place]) 05/31/2014  . Esophageal reflux 05/31/2014  . Benign essential hypertension 05/31/2014  . Other and unspecified hyperlipidemia 05/31/2014  . Type II or unspecified type diabetes mellitus without mention of complication, not stated as uncontrolled 05/31/2014    Past Surgical History:  Procedure Laterality Date  . CHEST TUBE INSERTION N/A 06/25/2017   Procedure: UDJSHF CATHETER INSERTION;  Surgeon: Nestor Lewandowsky, MD;  Location: [facility] ORS;  Service: Thoracic;  Laterality: N/A;  . CHEST TUBE INSERTION Right 08/07/2017   Procedure: REMOVAL OF PLEUR X CATHETER;  Surgeon: Nestor Lewandowsky, MD;  Location: [facility] ORS;  Service: General;  Laterality: Right;  . NASAL SINUS SURGERY  2000  . TONSILLECTOMY    . VIDEO ASSISTED THORACOSCOPY Right 06/25/2017   Procedure: VIDEO ASSISTED THORACOSCOPY WITH TALC PLEURODESIS, CHEST TUBE INSERTION;  Surgeon: Nestor Lewandowsky, MD;  Location: [facility] ORS;  Service: Thoracic;  Laterality: Right;    Current Outpatient Rx  . Order #: [id number] Class: Historical Med  . Order #: [id number] Class: Historical Med  . Order #: [id number] Class:  Historical Med  . Order #: [id number] Class: Historical Med  . Order #: [id number] Class: Historical Med  . Order #: [id number] Class: Historical Med  . Order #: [id number] Class: Historical Med  . Order #: [id number] Class: Historical Med  . Order #:  [id number] Class: Historical Med  . Order #: [id number] Class: Historical Med  . Order #: [id number] Class: Historical Med  . Order #: [id number] Class: Historical Med    Allergies Sulfa antibiotics; Adhesive [tape]; Amoxicillin; and Other  Family History  Problem Relation Age of Onset  . Hypertension Mother   . Hypertension Father     Social History Social History   Tobacco Use  . Smoking status: Former Smoker    Types: Pipe    Last attempt to quit: 05/07/1974    Years since quitting: 44.0  . Smokeless tobacco: Never Used  . Tobacco comment: pt states he smoked a pipe a long time ago  Substance Use Topics  . Alcohol use: No  . Drug use: No    Review of Systems Constitutional: + fever and chills. Eyes: No visual changes. No eye discharge. ENT: No sore throat. No congestion or rhinorrhea. No ear pain. Cardiovascular: Denies chest pain. Denies palpitations. Respiratory: Denies shortness of breath.  No cough. Gastrointestinal: No abdominal pain.  No nausea, no vomiting.  No diarrhea.  No constipation. Genitourinary: Negative for dysuria. Musculoskeletal: Negative for back pain. Skin: Negative for rash. Neurological: Negative for headaches. No focal numbness, tingling or weakness.     ____________________________________________   PHYSICAL EXAM:  VITAL SIGNS: ED Triage Vitals [05/20/18 2125]  Enc Vitals Group     BP 130/70     Pulse Rate (!) 109     Resp 18     Temp (!) 100.5 F (38.1 C)     Temp Source Oral     SpO2 97 %     Weight 194 lb (88 kg)     Height      Head Circumference      Peak Flow      Pain Score 0     Pain Loc      Pain Edu?      Excl. in [place]?     Constitutional: Alert and oriented. WAnswers questions appropriately. Eyes: Conjunctivae are normal.  EOMI. No scleral icterus. Head: Atraumatic. Nose: No congestion/rhinnorhea. Mouth/Throat: Mucous membranes are moist.  Neck: No stridor.  Supple.  No meningismus. Cardiovascular: Normal rate,  regular rhythm. No murmurs, rubs or gallops.  Respiratory: Normal respiratory effort.  No accessory muscle use or retractions. Lungs CTAB.  No wheezes, rales or ronchi. Gastrointestinal: Soft, nontender and nondistended.  No guarding or rebound.  No peritoneal signs. Musculoskeletal: No LE edema. Neurologic:  A&Ox3.  Speech is clear.  Face and smile are symmetric.  EOMI.  Moves all extremities well. Skin:  Skin is warm, dry and intact. No rash noted. Psychiatric: Mood and affect are normal. Speech and behavior are normal.  Normal judgement  ____________________________________________   LABS (all labs ordered are listed, but only abnormal results are displayed)  Labs Reviewed  CBC WITH DIFFERENTIAL/PLATELET - Abnormal; Notable for the following components:      Result Value   RBC 3.70 (*)    Hemoglobin 11.2 (*)    HCT 32.9 (*)    Platelets 535 (*)    Neutro Abs 7.8 (*)    Lymphs Abs 0.8 (*)    All other components within normal limits  URINALYSIS, ROUTINE W REFLEX MICROSCOPIC - Abnormal; Notable for the following components:  Color, Urine YELLOW (*)    APPearance CLEAR (*)    Ketones, ur 20 (*)    Protein, ur 30 (*)    All other components within normal limits  COMPREHENSIVE METABOLIC PANEL - Abnormal; Notable for the following components:   CO2 20 (*)    Glucose, Bld 144 (*)    Creatinine, Ser 1.50 (*)    Albumin 3.2 (*)    Alkaline Phosphatase 179 (*)    GFR calc non Af Amer 44 (*)    GFR calc Af Amer 52 (*)    All other components within normal limits  BLOOD GAS, VENOUS - Abnormal; Notable for the following components:   pH, Ven 7.49 (*)    pCO2, Ven 30 (*)    pO2, Ven 52.0 (*)    All other components within normal limits  CULTURE, BLOOD (ROUTINE X 2)  CULTURE, BLOOD (ROUTINE X 2)  LACTIC ACID, PLASMA  TROPONIN I  LACTIC ACID, PLASMA   ____________________________________________  EKG  ED ECG REPORT I, Eula Listen, the attending physician, personally  viewed and interpreted this ECG.   Date: 05/21/2018  EKG Time: 2148  Rate: 100  Rhythm: sinus tachycardia  Axis: normal  Intervals:none  ST&T Change: No STEMI  ____________________________________________  RADIOLOGY  Dg Chest 2 View  Result Date: 05/20/2018 CLINICAL DATA:  Sepsis and fever to 102. Chemotherapy treatment this morning. History of right lung cancer. EXAM: CHEST - 2 VIEW COMPARISON:  Chest CT 05/06/2018, CXR 05/07/2018 FINDINGS: Right basilar confluent opacities identified with blunting the right lateral costophrenic angle. This is not definitively identified on the lateral view. Findings could reflect a small effusion with adjacent right basilar pulmonary consolidation versus superimposition of right basilar pleural fat. Nodular density on the lateral view anteriorly would be in keeping with the patient's known pulmonary nodule at the right lung base. This measures 13 mm on the lateral view. No aggressive osseous lesions. IMPRESSION: 1. Homogeneous opacity at the right lung base best seen on the frontal view but not clearly confirmed on the lateral projection. Findings could be related to a layering effusion, pulmonary consolidation or combination thereof. Short-term interval follow-up is recommended after treating for pneumonia. 2. Small nodular opacity at the right lung base anteriorly best seen on the lateral view would be in keeping with one of the patient's known pulmonary nodules. Electronically Signed   By: Ashley Royalty M.D.   On: 05/20/2018 23:45    ____________________________________________   PROCEDURES  Procedure(s) performed: None  Procedures  Critical Care performed: Yes ____________________________________________   INITIAL IMPRESSION / ASSESSMENT AND PLAN / ED COURSE  Pertinent labs & imaging results that were available during my care of the patient were reviewed by me and considered in my medical decision making (see chart for details).  72 y.o. male  on chemo for mesothelioma presenting w/ asymptomatic fever to 102.0.  Blood cultures, urinalysis and chest x-ray have been ordered.  The patient has been initiated on intravenous fluids and empiric antibiotics.  ----------------------------------------- 12:14 AM on 05/21/2018 -----------------------------------------  The patient's work-up in the emergency department has been reassuring.  His urinalysis does not show UTI.  His chest x-ray does show some right lung abnormalities, which may be consistent with pneumonia although his underlying disease could also produce some abnormalities in his x-ray.  He has been covered for hospital-acquired pneumonia if that is what is causing his fever.  Blood cultures have been sent.  The patient has been admitted to the hospitalist for  further evaluation and treatment.  At this time, I have talked to the patient and his wife about his results, and they are in agreement with the plan.  CRITICAL CARE Performed by: Eula Listen   Total critical care time: 35 minutes  Critical care time was exclusive of separately billable procedures and treating other patients.  Critical care was necessary to treat or prevent imminent or life-threatening deterioration.  Critical care was time spent personally by me on the following activities: development of treatment plan with patient and/or surrogate as well as nursing, discussions with consultants, evaluation of patient's response to treatment, examination of patient, obtaining history from patient or surrogate, ordering and performing treatments and interventions, ordering and review of laboratory studies, ordering and review of radiographic studies, pulse oximetry and re-evaluation of patient's condition.  ____________________________________________  FINAL CLINICAL IMPRESSION(S) / ED DIAGNOSES  Final diagnoses:  Fever, unspecified fever cause  HCAP (healthcare-associated pneumonia)         NEW  MEDICATIONS STARTED DURING THIS VISIT:  New Prescriptions   No medications on file      Eula Listen, MD 05/21/18 0015    Eula Listen, MD 06/02/18 2317

## 2018-05-21 NOTE — Progress Notes (Addendum)
ANTIBIOTIC CONSULT NOTE - INITIAL  Pharmacy Consult for Vancomycin, Cefepime  Indication: sepsis  Allergies  Allergen Reactions  . Sulfa Antibiotics Swelling    Facial swelling No tongue or lips swelling, no difficulty breathing.  . Adhesive [Tape] Itching and Rash  . Amoxicillin Nausea Only and Other (See Comments)    Has patient had a PCN reaction causing immediate rash, facial/tongue/throat swelling, SOB or lightheadedness with hypotension: No Has patient had a PCN reaction causing severe rash involving mucus membranes or skin necrosis: No Has patient had a PCN reaction that required hospitalization: No Has patient had a PCN reaction occurring within the last 10 years: Yes If all of the above answers are "NO", then may proceed with Cephalosporin use.   . Other Rash    Surgical tape    Patient Measurements: Weight: 194 lb (88 kg) Adjusted Body Weight: 79 kg   Vital Signs: Temp: 100.4 F (38 C) (06/26 2210) Temp Source: Oral (06/26 2210) BP: 155/76 (06/27 0000) Pulse Rate: 91 (06/27 0000) Intake/Output from previous day: No intake/output data recorded. Intake/Output from this shift: No intake/output data recorded.  Labs: Recent Labs    05/20/18 0821 05/20/18 2151  WBC 6.1 9.7  HGB 11.1* 11.2*  PLT 457* 535*  CREATININE 1.56* 1.50*   Estimated Creatinine Clearance: 49 mL/min (A) (by C-G formula based on SCr of 1.5 mg/dL (H)). No results for input(s): VANCOTROUGH, VANCOPEAK, VANCORANDOM, GENTTROUGH, GENTPEAK, GENTRANDOM, TOBRATROUGH, TOBRAPEAK, TOBRARND, AMIKACINPEAK, AMIKACINTROU, AMIKACIN in the last 72 hours.   Microbiology: Recent Results (from the past 720 hour(s))  Culture, blood (Routine x 2)     Status: None   Collection Time: 05/07/18  8:32 PM  Result Value Ref Range Status   Specimen Description BLOOD BLOOD LEFT FOREARM  Final   Special Requests   Final    BOTTLES DRAWN AEROBIC AND ANAEROBIC Blood Culture results may not be optimal due to an excessive  volume of blood received in culture bottles   Culture   Final    NO GROWTH 5 DAYS Performed at [facility], [address] [place], [place] [address]    Report Status 05/12/2018 FINAL  Final  Culture, blood (Routine x 2)     Status: None   Collection Time: 05/07/18  8:32 PM  Result Value Ref Range Status   Specimen Description BLOOD BLOOD RIGHT FOREARM  Final   Special Requests   Final    BOTTLES DRAWN AEROBIC AND ANAEROBIC Blood Culture results may not be optimal due to an excessive volume of blood received in culture bottles   Culture   Final    NO GROWTH 5 DAYS Performed at [facility] [address] [place], [place] [address]    Report Status 05/12/2018 FINAL  Final  MRSA PCR Screening     Status: None   Collection Time: 05/07/18 11:50 PM  Result Value Ref Range Status   MRSA by PCR NEGATIVE NEGATIVE Final    Comment:        The GeneXpert MRSA Assay (FDA approved for NASAL specimens only), is one component of a comprehensive MRSA colonization surveillance program. It is not intended to diagnose MRSA infection nor to guide or monitor treatment for MRSA infections. Performed at [facility], [address], [place], [place] [address]   Urine culture     Status: None   Collection Time: 05/08/18 12:09 AM  Result Value Ref Range Status   Specimen Description   Final    URINE,  RANDOM Performed at [facility], [address], [place], [place] [address]    Special Requests   Final    NONE Performed at [facility], [address] [place], [place] [address]    Culture   Final    NO GROWTH Performed at [facility], [place] [address], [place], [place] [address]    Report Status 05/09/2018 FINAL  Final  Gastrointestinal Panel by PCR , Stool     Status: None   Collection Time: 05/10/18 11:26 AM  Result Value Ref Range Status   Campylobacter species NOT DETECTED NOT DETECTED Final   Plesimonas shigelloides NOT  DETECTED NOT DETECTED Final   Salmonella species NOT DETECTED NOT DETECTED Final   Yersinia enterocolitica NOT DETECTED NOT DETECTED Final   Vibrio species NOT DETECTED NOT DETECTED Final   Vibrio cholerae NOT DETECTED NOT DETECTED Final   Enteroaggregative E coli (EAEC) NOT DETECTED NOT DETECTED Final   Enteropathogenic E coli (EPEC) NOT DETECTED NOT DETECTED Final   Enterotoxigenic E coli (ETEC) NOT DETECTED NOT DETECTED Final   Shiga like toxin producing E coli (STEC) NOT DETECTED NOT DETECTED Final   Shigella/Enteroinvasive E coli (EIEC) NOT DETECTED NOT DETECTED Final   Cryptosporidium NOT DETECTED NOT DETECTED Final   Cyclospora cayetanensis NOT DETECTED NOT DETECTED Final   Entamoeba histolytica NOT DETECTED NOT DETECTED Final   Giardia lamblia NOT DETECTED NOT DETECTED Final   Adenovirus F40/41 NOT DETECTED NOT DETECTED Final   Astrovirus NOT DETECTED NOT DETECTED Final   Norovirus GI/GII NOT DETECTED NOT DETECTED Final   Rotavirus A NOT DETECTED NOT DETECTED Final   Sapovirus (I, II, IV, and V) NOT DETECTED NOT DETECTED Final    Comment: Performed at [facility], [address] [place], [place] [address]  Respiratory Panel by PCR     Status: None   Collection Time: 05/11/18  2:38 PM  Result Value Ref Range Status   Adenovirus NOT DETECTED NOT DETECTED Final   Coronavirus 229E NOT DETECTED NOT DETECTED Final   Coronavirus HKU1 NOT DETECTED NOT DETECTED Final   Coronavirus NL63 NOT DETECTED NOT DETECTED Final   Coronavirus OC43 NOT DETECTED NOT DETECTED Final   Metapneumovirus NOT DETECTED NOT DETECTED Final   Rhinovirus / Enterovirus NOT DETECTED NOT DETECTED Final   Influenza A NOT DETECTED NOT DETECTED Final   Influenza B NOT DETECTED NOT DETECTED Final   Parainfluenza Virus 1 NOT DETECTED NOT DETECTED Final   Parainfluenza Virus 2 NOT DETECTED NOT DETECTED Final   Parainfluenza Virus 3 NOT DETECTED NOT DETECTED Final   Parainfluenza Virus 4 NOT DETECTED NOT  DETECTED Final   Respiratory Syncytial Virus NOT DETECTED NOT DETECTED Final   Bordetella pertussis NOT DETECTED NOT DETECTED Final   Chlamydophila pneumoniae NOT DETECTED NOT DETECTED Final   Mycoplasma pneumoniae NOT DETECTED NOT DETECTED Final    Comment: Performed at [facility], [place]. [address], [place], [place] [address]    Medical History: Past Medical History:  Diagnosis Date  . Benign essential hypertension 05/31/2014  . Cancer ([place]) 06/2017   right lung   . Cerebral infarction ([place]) 05/31/2014  . Cerebrovascular disease 05/31/2014  . CKD (chronic kidney disease) stage 3, GFR 30-59 ml/min (HCC) 02/23/2016  . CVA (cerebral infarction)   . Diabetes mellitus without complication ([place])   . Difficult intubation   . DVT (deep venous thrombosis) ([place])   . Esophageal reflux 05/31/2014  . H/O: CVA (cerebrovascular accident)   .  Hyperlipidemia   . Hypertension   . ICAO (internal carotid artery occlusion) March 01, 2014   Left  . 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 Pleural effusion 04/23/2017  . Stroke [facility]) April, 4,2015  . Type 2 diabetes mellitus with peripheral neuropathy ([place]) 02/23/2016  . Weakness     Medications:  Scheduled:  . enoxaparin (LOVENOX) injection  40 mg Subcutaneous Q24H   Assessment: CrCl = 49 ml/min Ke = 0.045 hr-1 T1/2 = 15.4 hrs Vd = 61.6 L   Goal of Therapy:  Vancomycin trough level 15-20 mcg/ml  Plan:  Expected duration 7 days with resolution of temperature and/or normalization of WBC   Zosyn 3.375 gm IV X 1 given on 6/26 @ 22:00. Cefepime 2 gm IV Q12H given on 6/27 @ 0400.  Vancomycin 1750 mg IV X 1 given in ED on 6/26 @ 22:00. Vancomycin 1250 mg IV Q18H ordered to start on 6/27 @ 10:00, ~ 12 hrs after 1st dose (stacked dosing). Pt will reach Css by 6/29 @ 22:00.  Will draw 1st trough on 6/29 @ 15:30, which will be close to Css.   05/21/18 08:56 vancomycin dose decreased to 1 gm IV Q18H due to very slight bump  in SCr, PMH CKD3, and Vd estimate on IBW. Estimated trough 16 mcg/ml. Ovid Curd A. [place], [place].D., BCPS   Robbins,Jason D 05/21/2018,12:17 AM

## 2018-05-21 NOTE — Progress Notes (Signed)
Patient ID: Ian Shaffer, male   DOB: [birth year], 72 y.o.   MRN: [id number]  [facility] PROGRESS NOTE  Ian Shaffer KXF:[id number] DOB: [birth year] DOA: 05/20/2018 PCP: Ian Late, MD  HPI/Subjective: Patient feels well besides a fever.  Had some shaking chills.  No cough or shortness of breath.  No diarrhea.  No nausea or vomiting.  No abdominal pain.  No burning on urination.  He has had chemo 2 times and each time had a fever.  Objective: Vitals:   05/21/18 0646 05/21/18 0800  BP:  115/64  Pulse:  90  Resp:  16  Temp: (!) 102 F (38.9 C) 97.9 F (36.6 C)  SpO2:  97%    Filed Weights   05/20/18 2125 05/21/18 0052  Weight: 88 kg (194 lb) 87.4 kg (192 lb 9.6 oz)    ROS: Review of Systems  Constitutional: Negative for chills and fever.  Eyes: Negative for blurred vision.  Respiratory: Negative for cough and shortness of breath.   Cardiovascular: Negative for chest pain.  Gastrointestinal: Negative for abdominal pain, constipation, diarrhea, nausea and vomiting.  Genitourinary: Negative for dysuria.  Musculoskeletal: Negative for joint pain.  Neurological: Negative for dizziness and headaches.   Exam: Physical Exam  Constitutional: He is oriented to person, place, and time.  HENT:  Nose: No mucosal edema.  Mouth/Throat: No oropharyngeal exudate or posterior oropharyngeal edema.  Eyes: Pupils are equal, round, and reactive to light. Conjunctivae, EOM and lids are normal.  Neck: No JVD present. Carotid bruit is not present. No edema present. No thyroid mass and no thyromegaly present.  Cardiovascular: S1 normal and S2 normal. Exam reveals no gallop.  No murmur heard. Pulses:      Dorsalis pedis pulses are 2+ on the right side, and 2+ on the left side.  Respiratory: No respiratory distress. He has no wheezes. He has no rhonchi. He has no rales.  GI: Soft. Bowel sounds are normal. There is no tenderness.  Musculoskeletal:       Right ankle: He exhibits  no swelling.       Left ankle: He exhibits no swelling.  Lymphadenopathy:    He has no cervical adenopathy.  Neurological: He is alert and oriented to person, place, and time. No cranial nerve deficit.  Skin: Skin is warm. No rash noted. Nails show no clubbing.  Psychiatric: He has a normal mood and affect.      Data Reviewed: Basic Metabolic Panel: Recent Labs  Lab 05/20/18 0821 05/20/18 2151 05/21/18 0500  NA 141 137 138  K 4.1 4.5 4.0  CL 104 104 109  CO2 26 20* 20*  GLUCOSE 154* 144* 143*  BUN 19 20 18   CREATININE 1.56* 1.50* 1.55*  CALCIUM 9.5 8.9 8.1*   Liver Function Tests: Recent Labs  Lab 05/20/18 0821 05/20/18 2151  AST 34 39  ALT 33 31  ALKPHOS 178* 179*  BILITOT 0.3 0.6  PROT 6.8 6.8  ALBUMIN 3.1* 3.2*   CBC: Recent Labs  Lab 05/20/18 0821 05/20/18 2151 05/21/18 0500  WBC 6.1 9.7 6.4  NEUTROABS 3.5 7.8*  --   HGB 11.1* 11.2* 9.7*  HCT 31.7* 32.9* 27.7*  MCV 88.2 89.1 89.3  PLT 457* 535* 430   Cardiac Enzymes: Recent Labs  Lab 05/20/18 2151  TROPONINI <0.03    CBG: Recent Labs  Lab 05/21/18 0731 05/21/18 1158  GLUCAP 126* 115*    Recent Results (from the past 240 hour(s))  Blood Culture (routine x 2)  Status: None (Preliminary result)   Collection Time: 05/20/18  9:51 PM  Result Value Ref Range Status   Specimen Description BLOOD LEFT HAND  Final   Special Requests   Final    BOTTLES DRAWN AEROBIC AND ANAEROBIC Blood Culture adequate volume   Culture   Final    NO GROWTH < 12 HOURS Performed at [facility], [address], [place], [place] [address]    Report Status PENDING  Incomplete  Blood Culture (routine x 2)     Status: None (Preliminary result)   Collection Time: 05/20/18  9:56 PM  Result Value Ref Range Status   Specimen Description BLOOD RIGHT AC  Final   Special Requests   Final    BOTTLES DRAWN AEROBIC AND ANAEROBIC Blood Culture results may not be optimal due to an inadequate volume of blood  received in culture bottles   Culture   Final    NO GROWTH < 12 HOURS Performed at [facility], [address], [place], [place] [address]    Report Status PENDING  Incomplete     Studies: Dg Chest 2 View  Result Date: 05/20/2018 CLINICAL DATA:  Sepsis and fever to 102. Chemotherapy treatment this morning. History of right lung cancer. EXAM: CHEST - 2 VIEW COMPARISON:  Chest CT 05/06/2018, CXR 05/07/2018 FINDINGS: Right basilar confluent opacities identified with blunting the right lateral costophrenic angle. This is not definitively identified on the lateral view. Findings could reflect a small effusion with adjacent right basilar pulmonary consolidation versus superimposition of right basilar pleural fat. Nodular density on the lateral view anteriorly would be in keeping with the patient's known pulmonary nodule at the right lung base. This measures 13 mm on the lateral view. No aggressive osseous lesions. IMPRESSION: 1. Homogeneous opacity at the right lung base best seen on the frontal view but not clearly confirmed on the lateral projection. Findings could be related to a layering effusion, pulmonary consolidation or combination thereof. Short-term interval follow-up is recommended after treating for pneumonia. 2. Small nodular opacity at the right lung base anteriorly best seen on the lateral view would be in keeping with one of the patient's known pulmonary nodules. Electronically Signed   By: Ian Royalty M.D.   On: 05/20/2018 23:45    Scheduled Meds: . enoxaparin (LOVENOX) injection  40 mg Subcutaneous Q24H  . insulin aspart  0-5 Units Subcutaneous QHS  . insulin aspart  0-9 Units Subcutaneous TID WC   Continuous Infusions: . [START ON 05/22/2018] ceFEPime (MAXIPIME) IV    . vancomycin Stopped (05/21/18 1258)    Assessment/Plan:  1. Fever after chemotherapy.  Unclear if this is an infection but given aggressive antibiotics.  So far cultures are negative.  Had a temperature  of 102 this morning but currently afebrile.  Case discussed with Dr. Rogue Shaffer oncology and he may have to consider a different chemotherapy agent since this happened after each chemotherapy. 2. History of stroke on aspirin and Plavix 3. Essential hypertension.  Blood pressure little bit on the lower side.  Will monitor off antihypertensives at this time. 4. Hyperlipidemia unspecified on pravastatin 5. History of mesothelioma.  Oncology to evaluate. 6. Chronic kidney disease stage III.  Code Status:     Code Status Orders  (From admission, onward)        Start     Ordered   05/21/18 0017  Full code  Continuous     05/21/18 0016    Code Status History  Date Active Date Inactive Code Status Order ID Comments User Context   05/07/2018 2248 05/12/2018 2006 Full Code [id number]  Harrie Foreman, MD Inpatient   09/09/2017 0242 09/09/2017 1722 Full Code [id number]  Demetrios Loll, MD Inpatient   06/25/2017 1347 06/27/2017 1659 Full Code [id number]  Nestor Lewandowsky, MD Inpatient   04/23/2017 0134 04/23/2017 1657 Full Code [id number]  Hugelmeyer, Ubaldo Glassing, DO Inpatient     Family Communication: Family at the bedside Disposition Plan: To be determined  Consultants:  Oncology  Antibiotics:  Vancomycin  Cefepime  Time spent: 28 minutes, case discussed with oncology  Navaeh Kehres [employer]

## 2018-05-21 NOTE — ED Notes (Signed)
Report called to [employer]

## 2018-05-22 LAB — GLUCOSE, CAPILLARY: GLUCOSE-CAPILLARY: 99 mg/dL (ref 70–99)

## 2018-05-22 MED ORDER — ACETAMINOPHEN 325 MG PO TABS: 650.0000 mg | ORAL_TABLET | Freq: Four times a day (QID) | ORAL | Status: DC | PRN

## 2018-05-22 MED ORDER — DIPHENHYDRAMINE HCL 25 MG PO CAPS: 25.0000 mg | ORAL_CAPSULE | Freq: Once | ORAL | Status: DC

## 2018-05-22 MED ORDER — LEVOFLOXACIN 500 MG PO TABS: 500.0000 mg | ORAL_TABLET | Freq: Every day | ORAL | 0 refills | Status: AC

## 2018-05-22 MED ORDER — LEVOFLOXACIN 500 MG PO TABS: 500.0000 mg | ORAL_TABLET | Freq: Every day | ORAL | Status: DC

## 2018-05-22 MED ORDER — TRIAMCINOLONE ACETONIDE 0.1 % EX CREA
TOPICAL_CREAM | Freq: Two times a day (BID) | CUTANEOUS | Status: DC
  Filled 2018-05-22: qty 15

## 2018-05-22 MED ORDER — LEVOFLOXACIN 500 MG PO TABS
500.0000 mg | ORAL_TABLET | Freq: Every day | ORAL | Status: DC
  Administered 2018-05-22: 500 mg via ORAL
  Filled 2018-05-22 (×2): qty 1

## 2018-05-22 MED ORDER — TRIAMCINOLONE ACETONIDE 0.1 % EX CREA: TOPICAL_CREAM | Freq: Two times a day (BID) | CUTANEOUS | 0 refills | Status: DC

## 2018-05-22 NOTE — Discharge Instructions (Signed)
Fever, Adult A fever is an increase in the body's temperature. It is often defined as a temperature of 100 F (38C) or higher. Short mild or moderate fevers often have no long-term effects. They also often do not need treatment. Moderate or high fevers may make you feel uncomfortable. Sometimes, they can also be a sign of a serious illness or disease. The sweating that may happen with repeated fevers or fevers that last a while may also cause you to not have enough fluid in your body (dehydration). You can take your temperature with a thermometer to see if you have a fever. A measured temperature can change with:  Age.  Time of day.  Where the thermometer is placed: ? Mouth (oral). ? Rectum (rectal). ? Ear (tympanic). ? Underarm (axillary). ? Forehead (temporal).  Follow these instructions at home: Pay attention to any changes in your symptoms. Take these actions to help with your condition:  Take over-the-counter and prescription medicines only as told by your doctor. Follow the dosing instructions carefully.  If you were prescribed an antibiotic medicine, take it as told by your doctor. Do not stop taking the antibiotic even if you start to feel better.  Rest as needed.  Drink enough fluid to keep your pee (urine) clear or pale yellow.  Sponge yourself or bathe with room-temperature water as needed. This helps to lower your body temperature . Do not use ice water.  Do not wear too many blankets or heavy clothes.  Contact a doctor if:  You throw up (vomit).  You cannot eat or drink without throwing up.  You have watery poop (diarrhea).  It hurts when you pee.  Your symptoms do not get better with treatment.  You have new symptoms.  You feel very weak. Get help right away if:  You are short of breath or have trouble breathing.  You are dizzy or you pass out (faint).  You feel confused.  You have signs of not having enough fluid in your body, such as: ? A dry  mouth. ? Peeing less. ? Looking pale.  You have very bad pain in your belly (abdomen).  You keep throwing up or having water poop.  You have a skin rash.  Your symptoms suddenly get worse. This information is not intended to replace advice given to you by your health care provider. Make sure you discuss any questions you have with your health care provider. Document Released: 08/20/2008 Document Revised: 04/18/2016 Document Reviewed: 01/05/2015 Elsevier Interactive Patient Education  2018 Elsevier Inc.  

## 2018-05-22 NOTE — Progress Notes (Signed)
Patient discharged with spouse, verbalized understanding of discharge instructions. Patient with no complaints.

## 2018-05-22 NOTE — Progress Notes (Signed)
Ian Shaffer   DOB:08-22-1946   PX#:[id number]    Subjective: Patient has not had any fever since yesterday.  He is feeling better.  No cough or chills.  No shortness of breath.  No skin rash.  Objective:  Vitals:   05/22/18 0550 05/22/18 0945  BP: (!) 127/59 (!) 142/77  Pulse: 86 93  Resp: 18 16  Temp: 99.1 F (37.3 C) 99.8 F (37.7 C)  SpO2: 95% 97%    No intake or output data in the 24 hours ending 05/24/18 1809  GENERAL Alert, no distress and comfortable.  She is alone. EYES: no pallor or icterus OROPHARYNX: no thrush or ulceration. NECK: supple, no masses felt LYMPH:  no palpable lymphadenopathy in the cervical, axillary or inguinal regions LUNGS: decreased breath sounds to auscultation at bases and  No wheeze or crackles HEART/CVS: regular rate & rhythm and no murmurs; No lower extremity edema ABDOMEN: abdomen soft, tender  on deep palpation. and normal bowel sounds Musculoskeletal:no cyanosis of digits and no clubbing  PSYCH: alert & oriented x 3 with fluent speech NEURO: no focal motor/sensory deficits SKIN:  no rashes or significant lesions   Labs:  Lab Results  Component Value Date   WBC 6.4 05/21/2018   HGB 9.7 (L) 05/21/2018   HCT 27.7 (L) 05/21/2018   MCV 89.3 05/21/2018   PLT 430 05/21/2018   NEUTROABS 7.8 (H) 05/20/2018    Lab Results  Component Value Date   NA 138 05/21/2018   K 4.0 05/21/2018   CL 109 05/21/2018   CO2 20 (L) 05/21/2018    Studies:  No results found.  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  2 orally to assess for response-however I suspect patient's fever is related to gemcitabine [see discussion below].  Patient's further chemotherapy options limited.  #Fever-up to 103 status post each dose of gemcitabine  within 24 hours-highly suspicious for drug fever; however need to rule out infectious etiology.  Blood cultures negative so far.  Clinically improved.  Recommend discharge on p.o. Levaquin.  #I will plan of care was discussed with the patient in detail.  Also discussed with Dr. Bobbye Charleston.  He agrees.  Patient follow-up with me as planned in the clinic.    Cammie Sickle, MD

## 2018-05-22 NOTE — Discharge Summary (Signed)
[place] [facility] [place] NAME: Ian Shaffer    MR#:  [id number]  DATE OF BIRTH:  [birth year]  DATE OF ADMISSION:  05/20/2018 ADMITTING PHYSICIAN: Lance Coon, MD  DATE OF DISCHARGE: 05/22/2018 10:30 AM  PRIMARY CARE PHYSICIAN: Derinda Late, MD    ADMISSION DIAGNOSIS:  HCAP (healthcare-associated pneumonia) [J18.9] Fever, unspecified fever cause [R50.9]  DISCHARGE DIAGNOSIS:  Principal Problem:   SIRS (systemic inflammatory response syndrome) (HCC) Active Problems:   CKD (chronic kidney disease) stage 3, GFR 30-59 ml/min (HCC)   Esophageal reflux   Benign essential hypertension   Type 2 diabetes mellitus with peripheral neuropathy ([place])   [name], malignant ([place])   SECONDARY DIAGNOSIS:   Past Medical History:  Diagnosis Date  . Benign essential hypertension 05/31/2014  . Cancer ([place]) 06/2017   right lung   . Cerebral infarction ([place]) 05/31/2014  . Cerebrovascular disease 05/31/2014  . CKD (chronic kidney disease) stage 3, GFR 30-59 ml/min (HCC) 02/23/2016  . CVA (cerebral infarction)   . Diabetes mellitus without complication ([place])   . Difficult intubation   . DVT (deep venous thrombosis) ([place])   . Esophageal reflux 05/31/2014  . H/O: CVA (cerebrovascular accident)   . Hyperlipidemia   . Hypertension   . ICAO (internal carotid artery occlusion) March 01, 2014   Left  . 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 Pleural effusion 04/23/2017  . Stroke [facility]) April, 4,2015  . Type 2 diabetes mellitus with peripheral neuropathy ([place]) 02/23/2016  . Weakness     HOSPITAL COURSE:   1.  Fever after chemotherapy.  Unclear if this is infection but she was given aggressive antibiotics in the emergency room and hospital course.  Patient had a maximum temperature of 103.  Temperature 99 upon discharge.  Case discussed with Dr. Rogue Bussing switch over to p.o. Levaquin upon going home 2.  Rash left arm after infusion of  vancomycin.  Patient refused steroids.  Prescribed Benadryl and triamcinolone cream. 3.  History of stroke on aspirin and Plavix 4.  Hyperlipidemia unspecified on pravastatin 5.  History of mesothelioma 6.  Chronic kidney disease stage III 7.  Anemia of chronic disease  DISCHARGE CONDITIONS:   Satisfactory   CONSULTS OBTAINED:  Treatment Team:  Cammie Sickle, MD  DRUG ALLERGIES:   Allergies  Allergen Reactions  . Sulfa Antibiotics Swelling    Facial swelling No tongue or lips swelling, no difficulty breathing.  . Adhesive [Tape] Itching and Rash  . Amoxicillin Nausea Only and Other (See Comments)    Has patient had a PCN reaction causing immediate rash, facial/tongue/throat swelling, SOB or lightheadedness with hypotension: No Has patient had a PCN reaction causing severe rash involving mucus membranes or skin necrosis: No Has patient had a PCN reaction that required hospitalization: No Has patient had a PCN reaction occurring within the last 10 years: Yes If all of the above answers are "NO", then may proceed with Cephalosporin use.   . Other Rash    Surgical tape    DISCHARGE MEDICATIONS:   Allergies as of 05/22/2018      Reactions   Sulfa Antibiotics Swelling   Facial swelling No tongue or lips swelling, no difficulty breathing.   Adhesive [tape] Itching, Rash   Amoxicillin Nausea Only, Other (See Comments)   Has patient had a PCN reaction causing immediate rash, facial/tongue/throat swelling, SOB or lightheadedness with hypotension: No Has patient had a PCN reaction causing severe rash involving mucus membranes or  skin necrosis: No Has patient had a PCN reaction that required hospitalization: No Has patient had a PCN reaction occurring within the last 10 years: Yes If all of the above answers are "NO", then may proceed with Cephalosporin use.   Other Rash   Surgical tape      Medication List    STOP taking these medications   amLODipine 5 MG  tablet Commonly known as:  NORVASC   lisinopril 10 MG tablet Commonly known as:  PRINIVIL,ZESTRIL     TAKE these medications   acetaminophen 325 MG tablet Commonly known as:  TYLENOL Take 2 tablets (650 mg total) by mouth every 6 (six) hours as needed for mild pain (or Fever >/= 101).   aspirin EC 81 MG tablet Take 81 mg by mouth daily.   CENTRUM SILVER PO Take 1 tablet by mouth daily.   clopidogrel 75 MG tablet Commonly known as:  PLAVIX Take 75 mg by mouth daily.   fluticasone 50 MCG/ACT nasal spray Commonly known as:  FLONASE Place 1 spray into both nostrils daily at 2 PM.   levofloxacin 500 MG tablet Commonly known as:  LEVAQUIN Take 1 tablet (500 mg total) by mouth daily for 5 days. Start taking on:  05/23/2018   metoprolol succinate 25 MG 24 hr tablet Commonly known as:  TOPROL-XL Take 25 mg by mouth daily.   ondansetron 8 MG tablet Commonly known as:  ZOFRAN Take 8 mg by mouth every 8 (eight) hours as needed for nausea or refractory nausea / vomiting.   polyethylene glycol powder powder Commonly known as:  GLYCOLAX/MIRALAX Take 17 g by mouth daily as needed (for constipation.).   pravastatin 40 MG tablet Commonly known as:  PRAVACHOL Take 40 mg by mouth daily.   prochlorperazine 10 MG tablet Commonly known as:  COMPAZINE Take 10 mg by mouth every 8 (eight) hours as needed for vomiting.   triamcinolone cream 0.1 % Commonly known as:  KENALOG Apply topically 2 (two) times daily.   VICTOZA 18 MG/3ML Sopn Generic drug:  liraglutide Inject 1.2 mg into the skin daily.        DISCHARGE INSTRUCTIONS:   Follow up PMD 6 days  Dr. Rogue Bussing  follow-up 1 to 2 weeks as scheduled   If you experience worsening of your admission symptoms, develop shortness of breath, life threatening emergency, suicidal or homicidal thoughts you must seek medical attention immediately by calling 911 or calling your MD immediately  if symptoms less severe.  You Must read  complete instructions/literature along with all the possible adverse reactions/side effects for all the Medicines you take and that have been prescribed to you. Take any new Medicines after you have completely understood and accept all the possible adverse reactions/side effects.   Please note  You were cared for by a hospitalist during your hospital stay. If you have any questions about your discharge medications or the care you received while you were in the hospital after you are discharged, you can call the unit and asked to speak with the hospitalist on call if the hospitalist that took care of you is not available. Once you are discharged, your primary care physician will handle any further medical issues. Please note that NO REFILLS for any discharge medications will be authorized once you are discharged, as it is imperative that you return to your primary care physician (or establish a relationship with a primary care physician if you do not have one) for your aftercare needs so that  they can reassess your need for medications and monitor your lab values.    Today   CHIEF COMPLAINT:   Chief Complaint  Patient presents with  . Fever    HISTORY OF PRESENT ILLNESS:  Ian Shaffer  is a 72 y.o. male with a known history of mesothelioma presents with fever after chemotherapy   VITAL SIGNS:  Blood pressure (!) 142/77, pulse 93, temperature 99.8 F (37.7 C), temperature source Oral, resp. rate 16, height 5\' 10"  (1.778 m), weight 87.4 kg (192 lb 9.6 oz), SpO2 97 %.    PHYSICAL EXAMINATION:  GENERAL:  72 y.o.-year-old patient lying in the bed with no acute distress.  EYES: Pupils equal, round, reactive to light and accommodation. No scleral icterus. Extraocular muscles intact.  HEENT: Head atraumatic, normocephalic. Oropharynx and nasopharynx clear.  NECK:  Supple, no jugular venous distention. No thyroid enlargement, no tenderness.  LUNGS: Normal breath sounds bilaterally, no wheezing,  rales,rhonchi or crepitation. No use of accessory muscles of respiration.  CARDIOVASCULAR: S1, S2 normal. No murmurs, rubs, or gallops.  ABDOMEN: Soft, non-tender, non-distended. Bowel sounds present. No organomegaly or mass.  EXTREMITIES: No pedal edema, cyanosis, or clubbing.  NEUROLOGIC: Cranial nerves II through XII are intact. Muscle strength 5/5 in all extremities. Sensation intact. Gait not checked.  PSYCHIATRIC: The patient is alert and oriented x 3.  SKIN:  slight rash left arm after vancomycin infusion  DATA REVIEW:   CBC Recent Labs  Lab 05/21/18 0500  WBC 6.4  HGB 9.7*  HCT 27.7*  PLT 430    Chemistries  Recent Labs  Lab 05/20/18 2151 05/21/18 0500  NA 137 138  K 4.5 4.0  CL 104 109  CO2 20* 20*  GLUCOSE 144* 143*  BUN 20 18  CREATININE 1.50* 1.55*  CALCIUM 8.9 8.1*  AST 39  --   ALT 31  --   ALKPHOS 179*  --   BILITOT 0.6  --     Cardiac Enzymes Recent Labs  Lab 05/20/18 2151  TROPONINI <0.03    Microbiology Results  Results for orders placed or performed during the hospital encounter of 05/20/18  Blood Culture (routine x 2)     Status: None (Preliminary result)   Collection Time: 05/20/18  9:51 PM  Result Value Ref Range Status   Specimen Description BLOOD LEFT HAND  Final   Special Requests   Final    BOTTLES DRAWN AEROBIC AND ANAEROBIC Blood Culture adequate volume   Culture   Final    NO GROWTH 2 DAYS Performed at [facility], [address], [place], [place] [address]    Report Status PENDING  Incomplete  Blood Culture (routine x 2)     Status: None (Preliminary result)   Collection Time: 05/20/18  9:56 PM  Result Value Ref Range Status   Specimen Description BLOOD RIGHT AC  Final   Special Requests   Final    BOTTLES DRAWN AEROBIC AND ANAEROBIC Blood Culture results may not be optimal due to an inadequate volume of blood received in culture bottles   Culture   Final    NO GROWTH 2 DAYS Performed at [facility],  [address] [place], [place] [address]    Report Status PENDING  Incomplete    RADIOLOGY:  Dg Chest 2 View  Result Date: 05/20/2018 CLINICAL DATA:  Sepsis and fever to 102. Chemotherapy treatment this morning. History of right lung cancer. EXAM: CHEST - 2 VIEW COMPARISON:  Chest CT 05/06/2018, CXR  05/07/2018 FINDINGS: Right basilar confluent opacities identified with blunting the right lateral costophrenic angle. This is not definitively identified on the lateral view. Findings could reflect a small effusion with adjacent right basilar pulmonary consolidation versus superimposition of right basilar pleural fat. Nodular density on the lateral view anteriorly would be in keeping with the patient's known pulmonary nodule at the right lung base. This measures 13 mm on the lateral view. No aggressive osseous lesions. IMPRESSION: 1. Homogeneous opacity at the right lung base best seen on the frontal view but not clearly confirmed on the lateral projection. Findings could be related to a layering effusion, pulmonary consolidation or combination thereof. Short-term interval follow-up is recommended after treating for pneumonia. 2. Small nodular opacity at the right lung base anteriorly best seen on the lateral view would be in keeping with one of the patient's known pulmonary nodules. Electronically Signed   By: Ashley Royalty M.D.   On: 05/20/2018 23:45      Management plans discussed with the patient, family and they are in agreement.  CODE STATUS:  Code Status History    Date Active Date Inactive Code Status Order ID Comments User Context   05/21/2018 0016 05/22/2018 1343 Full Code [id number]  Lance Coon, MD ED   05/07/2018 2248 05/12/2018 2006 Full Code [id number]  Harrie Foreman, MD Inpatient   09/09/2017 0242 09/09/2017 1722 Full Code [id number]  Demetrios Loll, MD Inpatient   06/25/2017 1347 06/27/2017 1659 Full Code [id number]  Nestor Lewandowsky, MD Inpatient   04/23/2017 0134 04/23/2017 1657 Full Code  [id number]  Hugelmeyer, Ubaldo Glassing, DO Inpatient      TOTAL TIME TAKING CARE OF THIS PATIENT: 35 minutes.    Loletha Grayer M.D on 05/22/2018 at 2:37 PM  Between 7am to 6pm - Pager - [phone]  After 6pm go to [email or web address] - password [email or web address] [facility]  [phone]  CC: Primary care physician; Derinda Late, MD

## 2018-05-25 LAB — CULTURE, BLOOD (ROUTINE X 2)
CULTURE: NO GROWTH
Culture: NO GROWTH
Special Requests: ADEQUATE

## 2018-06-02 ENCOUNTER — Telehealth: Payer: Self-pay | Admitting: Internal Medicine

## 2018-06-02 MED ORDER — MONTELUKAST SODIUM 10 MG PO TABS: 10.0000 mg | ORAL_TABLET | Freq: Every day | ORAL | 3 refills | Status: DC

## 2018-06-02 NOTE — Telephone Encounter (Signed)
Spoke with patient. Teach back process performed with patient instructions.

## 2018-06-02 NOTE — Telephone Encounter (Signed)
I tried to reach pt re: pre-medications prior to gemcitabine chemo planned for 7/10.  Heather- please reach out to pt/family to stress about the below plan to avoid "gemcitabine fevers"  # I would recommend the following strategy:  A] Take singulair 1 tablet-10 mg night prior [starting tonite] x 3 days. [prescrpition sent to phramacy]  B] Take tylenol 650 mg 1 tablet nightprior [starting tonite] x 3 days.   C] Take Benadryl 25 mg1 tablet-night prior [starting tonite] x 3 days.  -----------------------------------------------------

## 2018-06-03 ENCOUNTER — Inpatient Hospital Stay: Payer: Medicare Other

## 2018-06-03 ENCOUNTER — Other Ambulatory Visit: Payer: Self-pay

## 2018-06-03 ENCOUNTER — Inpatient Hospital Stay: Payer: Medicare Other | Attending: Internal Medicine

## 2018-06-03 ENCOUNTER — Encounter: Payer: Self-pay | Admitting: Internal Medicine

## 2018-06-03 ENCOUNTER — Inpatient Hospital Stay (HOSPITAL_BASED_OUTPATIENT_CLINIC_OR_DEPARTMENT_OTHER): Payer: Medicare Other | Admitting: Internal Medicine

## 2018-06-03 VITALS — BP 137/79 | HR 101 | Temp 97.9°F | Resp 20 | Ht 70.0 in | Wt 192.0 lb

## 2018-06-03 DIAGNOSIS — K529 Noninfective gastroenteritis and colitis, unspecified: Secondary | ICD-10-CM

## 2018-06-03 DIAGNOSIS — N183 Chronic kidney disease, stage 3 (moderate): Secondary | ICD-10-CM | POA: Diagnosis not present

## 2018-06-03 DIAGNOSIS — E119 Type 2 diabetes mellitus without complications: Secondary | ICD-10-CM

## 2018-06-03 DIAGNOSIS — Z5111 Encounter for antineoplastic chemotherapy: Secondary | ICD-10-CM | POA: Diagnosis present

## 2018-06-03 DIAGNOSIS — R509 Fever, unspecified: Secondary | ICD-10-CM

## 2018-06-03 DIAGNOSIS — Z79899 Other long term (current) drug therapy: Secondary | ICD-10-CM | POA: Diagnosis not present

## 2018-06-03 DIAGNOSIS — Z8673 Personal history of transient ischemic attack (TIA), and cerebral infarction without residual deficits: Secondary | ICD-10-CM | POA: Diagnosis not present

## 2018-06-03 DIAGNOSIS — C459 Mesothelioma, unspecified: Secondary | ICD-10-CM | POA: Diagnosis not present

## 2018-06-03 DIAGNOSIS — I129 Hypertensive chronic kidney disease with stage 1 through stage 4 chronic kidney disease, or unspecified chronic kidney disease: Secondary | ICD-10-CM | POA: Insufficient documentation

## 2018-06-03 DIAGNOSIS — K219 Gastro-esophageal reflux disease without esophagitis: Secondary | ICD-10-CM | POA: Insufficient documentation

## 2018-06-03 DIAGNOSIS — E785 Hyperlipidemia, unspecified: Secondary | ICD-10-CM | POA: Diagnosis not present

## 2018-06-03 LAB — CBC WITH DIFFERENTIAL/PLATELET
Basophils Absolute: 0.1 10*3/uL (ref 0–0.1)
Basophils Relative: 1 %
EOS ABS: 0.1 10*3/uL (ref 0–0.7)
EOS PCT: 1 %
HCT: 32.5 % — ABNORMAL LOW (ref 40.0–52.0)
Hemoglobin: 11.2 g/dL — ABNORMAL LOW (ref 13.0–18.0)
LYMPHS ABS: 1.1 10*3/uL (ref 1.0–3.6)
Lymphocytes Relative: 12 %
MCH: 30.5 pg (ref 26.0–34.0)
MCHC: 34.4 g/dL (ref 32.0–36.0)
MCV: 88.8 fL (ref 80.0–100.0)
MONO ABS: 1 10*3/uL (ref 0.2–1.0)
Monocytes Relative: 11 %
Neutro Abs: 7.3 10*3/uL — ABNORMAL HIGH (ref 1.4–6.5)
Neutrophils Relative %: 75 %
PLATELETS: 258 10*3/uL (ref 150–440)
RBC: 3.65 MIL/uL — AB (ref 4.40–5.90)
RDW: 15.2 % — AB (ref 11.5–14.5)
WBC: 9.6 10*3/uL (ref 3.8–10.6)

## 2018-06-03 LAB — COMPREHENSIVE METABOLIC PANEL
ALBUMIN: 3.5 g/dL (ref 3.5–5.0)
ALT: 18 U/L (ref 0–44)
AST: 29 U/L (ref 15–41)
Alkaline Phosphatase: 169 U/L — ABNORMAL HIGH (ref 38–126)
Anion gap: 11 (ref 5–15)
BILIRUBIN TOTAL: 0.6 mg/dL (ref 0.3–1.2)
BUN: 25 mg/dL — AB (ref 8–23)
CHLORIDE: 103 mmol/L (ref 98–111)
CO2: 23 mmol/L (ref 22–32)
CREATININE: 1.59 mg/dL — AB (ref 0.61–1.24)
Calcium: 9.2 mg/dL (ref 8.9–10.3)
GFR calc Af Amer: 48 mL/min — ABNORMAL LOW (ref >60)
GFR calc non Af Amer: 41 mL/min — ABNORMAL LOW (ref >60)
GLUCOSE: 169 mg/dL — AB (ref 70–99)
POTASSIUM: 4.2 mmol/L (ref 3.5–5.1)
Sodium: 137 mmol/L (ref 135–145)
TOTAL PROTEIN: 7.5 g/dL (ref 6.5–8.1)

## 2018-06-03 MED ORDER — ACETAMINOPHEN 325 MG PO TABS
650.0000 mg | ORAL_TABLET | Freq: Once | ORAL | Status: AC
  Administered 2018-06-03: 650 mg via ORAL
  Filled 2018-06-03: qty 2

## 2018-06-03 MED ORDER — PROCHLORPERAZINE MALEATE 10 MG PO TABS
10.0000 mg | ORAL_TABLET | Freq: Once | ORAL | Status: AC
  Administered 2018-06-03: 10 mg via ORAL

## 2018-06-03 MED ORDER — SODIUM CHLORIDE 0.9 % IV SOLN
Freq: Once | INTRAVENOUS | Status: AC
  Administered 2018-06-03: 11:00:00 via INTRAVENOUS
  Filled 2018-06-03: qty 1000

## 2018-06-03 MED ORDER — SODIUM CHLORIDE 0.9 % IV SOLN
2000.0000 mg | Freq: Once | INTRAVENOUS | Status: AC
  Administered 2018-06-03: 2000 mg via INTRAVENOUS
  Filled 2018-06-03: qty 52.6

## 2018-06-03 MED ORDER — DIPHENHYDRAMINE HCL 50 MG/ML IJ SOLN
50.0000 mg | Freq: Once | INTRAMUSCULAR | Status: AC
  Administered 2018-06-03: 50 mg via INTRAVENOUS
  Filled 2018-06-03: qty 1

## 2018-06-03 NOTE — Progress Notes (Signed)
[place] OFFICE PROGRESS NOTE  Patient Care Team: Derinda Late, MD as PCP - General (Family Medicine) Margaretha Sheffield, MD (Otolaryngology) Telford Nab, RN as Registered Nurse Nestor Lewandowsky, MD as Referring Physician (Cardiothoracic Surgery) Cammie Sickle, MD as Consulting Physician (Internal Medicine)  Cancer Staging No matching staging information was found for the patient.   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   # JUNE 2019- GEM  # Hx of CVA Right hand; no residual deficits [left sided carotid block; no surgery done]- on Asa/plavix; CKD- stage III; DM-2; ? PN  # MOLECULAR TESTING- PDL-1- 1% [LOW]; [facility]- No actionable**  -----------------------------------   DIAGNOSIS: [AUG 2019 ] ? [name]   STAGE:  IV  ;GOALS: Palliative  CURRENT/MOST RECENT THERAPY- June 2019- GEM     [name], malignant ([place])   03/19/2018 -  Chemotherapy    The patient had gemcitabine (GEMZAR) 2,000 mg in sodium chloride 0.9 % 250 mL chemo infusion, 2,128 mg, Intravenous,  Once, 2 of 4 cycles Administration: 2,000 mg (05/07/2018), 2,000 mg (05/20/2018), 2,000 mg (06/03/2018)  for chemotherapy treatment.          INTERVAL HISTORY:  Ian Shaffer  72 y.o.  male pleasant patient above history of unresectable mesothelioma is here to proceed with gemcitabine.  Patient was admitted twice to the hospital recently after each episode of IV gemcitabine infusion; infectious work-up in the hospital has been negative.  Patient has been treated with antibiotics.  Patient currently feels improved.  Complains of mild fatigue.  Patient has started the premedications [Singulair/Benadryl/Tylenol] yesterday.  Review of Systems  Constitutional: Positive for malaise/fatigue. Negative for chills, diaphoresis, fever (Fever chills resolved.) and weight loss.  HENT: Negative for nosebleeds and sore throat.   Eyes: Negative for double vision.  Respiratory: Negative for cough, hemoptysis, sputum production, shortness of breath and wheezing.   Cardiovascular: Negative for chest pain, palpitations, orthopnea and leg swelling.  Gastrointestinal: Negative for abdominal pain, blood in stool, constipation, diarrhea, heartburn, melena, nausea and vomiting.  Genitourinary: Negative for dysuria, frequency and urgency.  Musculoskeletal: Negative for back pain and joint pain.  Skin: Negative.  Negative for itching and rash.  Neurological: Negative for dizziness, tingling, focal weakness, weakness and headaches.  Endo/Heme/Allergies: Does not bruise/bleed easily.  Psychiatric/Behavioral: Negative for depression. The patient is not nervous/anxious and does not have insomnia.       PAST MEDICAL HISTORY :  Past Medical History:  Diagnosis Date  . Benign essential hypertension 05/31/2014  . Cancer ([place]) 06/2017   right lung   . Cerebral infarction ([place]) 05/31/2014  . Cerebrovascular disease 05/31/2014  . CKD (chronic kidney disease) stage 3, GFR 30-59 ml/min (HCC) 02/23/2016  . CVA (cerebral infarction)   . Diabetes mellitus without complication ([place])   . Difficult intubation   . DVT (deep venous thrombosis) ([place])   . Esophageal reflux 05/31/2014  .  H/O: CVA  (cerebrovascular accident)   . Hyperlipidemia   . Hypertension   . ICAO (internal carotid artery occlusion) March 01, 2014   Left  . 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 Pleural effusion 04/23/2017  . Stroke [facility]) April, 4,2015  . Type 2 diabetes mellitus with peripheral neuropathy ([place]) 02/23/2016  . Weakness     PAST SURGICAL HISTORY :   Past Surgical History:  Procedure Laterality Date  . CHEST TUBE INSERTION N/A 06/25/2017   Procedure: SFKCLE CATHETER INSERTION;  Surgeon: Nestor Lewandowsky, MD;  Location: [facility] ORS;  Service: Thoracic;  Laterality: N/A;  . CHEST TUBE INSERTION Right 08/07/2017   Procedure: REMOVAL OF PLEUR X CATHETER;  Surgeon: Nestor Lewandowsky, MD;  Location: [facility] ORS;  Service: General;  Laterality: Right;  . NASAL SINUS SURGERY  2000  . TONSILLECTOMY    . VIDEO ASSISTED THORACOSCOPY Right 06/25/2017   Procedure: VIDEO ASSISTED THORACOSCOPY WITH TALC PLEURODESIS, CHEST TUBE INSERTION;  Surgeon: Nestor Lewandowsky, MD;  Location: [facility] ORS;  Service: Thoracic;  Laterality: Right;    FAMILY HISTORY :   Family History  Problem Relation Age of Onset  . Hypertension Mother   . Hypertension Father     SOCIAL HISTORY:   Social History   Tobacco Use  . Smoking status: Former Smoker    Types: Pipe    Last attempt to quit: 05/07/1974    Years since quitting: 44.1  . Smokeless tobacco: Never Used  . Tobacco comment: pt states he smoked a pipe a long time ago  Substance Use Topics  . Alcohol use: No  . Drug use: No    ALLERGIES:  is allergic to sulfa antibiotics; adhesive [tape]; amoxicillin; and other.  MEDICATIONS:  Current Outpatient Medications  Medication Sig Dispense Refill  . acetaminophen (TYLENOL) 325 MG tablet Take 2 tablets (650 mg total) by mouth every 6 (six) hours as needed for mild pain (or Fever >/= 101).    Marland Kitchen amLODipine (NORVASC) 5 MG tablet Take 1 tablet by mouth daily.    Marland Kitchen aspirin EC 81 MG tablet Take 81 mg by mouth daily.     . clopidogrel (PLAVIX) 75 MG tablet Take 75 mg by mouth daily.    . fluticasone (FLONASE) 50 MCG/ACT nasal spray Place 1 spray into both nostrils daily at 2 PM.     . liraglutide (VICTOZA) 18 MG/3ML SOPN Inject 1.2 mg into the skin daily.    . metoprolol succinate (TOPROL-XL) 25 MG 24 hr tablet Take 25 mg by mouth daily.    . montelukast (SINGULAIR) 10 MG tablet Take 1 tablet (10 mg total) by mouth at bedtime. Take 1 tablet-10 mg night prior x 3 days. 40 tablet 3  . Multiple Vitamins-Minerals (CENTRUM SILVER PO) Take 1 tablet by mouth daily.    . ondansetron (ZOFRAN) 8 MG tablet Take 8 mg by mouth every 8 (eight) hours as needed for nausea or refractory nausea / vomiting.     . polyethylene glycol powder (GLYCOLAX/MIRALAX) powder Take 17 g by mouth daily as needed (for constipation.).    Marland Kitchen pravastatin (PRAVACHOL) 40 MG tablet Take 40 mg by mouth daily.    . prochlorperazine (COMPAZINE) 10 MG tablet Take 10 mg by mouth every 8 (eight) hours as needed for vomiting.     . triamcinolone cream (KENALOG) 0.1 % Apply topically 2 (two) times daily. 30 g 0   No current facility-administered medications for this visit.     PHYSICAL EXAMINATION: ECOG PERFORMANCE  STATUS: 1 - Symptomatic but completely ambulatory  BP 137/79 (BP Location: Right Arm, Patient Position: Sitting)   Pulse (!) 101   Temp 97.9 F (36.6 C) (Tympanic)   Resp 20   Ht _0  (1.778 m)   Wt 192 lb (87.1 kg)   BMI 27.55 kg/m   Filed Weights   06/03/18 0918  Weight: 192 lb (87.1 kg)    GENERAL: Well-nourished well-developed; Alert, no distress and comfortable.  Accompanied by family.  EYES: no pallor or icterus OROPHARYNX: no thrush or ulceration; NECK: supple; no lymph nodes felt. LYMPH:  no palpable lymphadenopathy in the axillary or inguinal regions LUNGS: Decreased breath sounds auscultation bilaterally. No wheeze or crackles HEART/CVS: regular rate & rhythm and no murmurs; No lower extremity edema ABDOMEN:abdomen  soft, non-tender and normal bowel sounds. No hepatomegaly or splenomegaly.  Musculoskeletal:no cyanosis of digits and no clubbing  PSYCH: alert & oriented x 3 with fluent speech NEURO: no focal motor/sensory deficits SKIN:  no rashes or significant lesions    LABORATORY DATA:  I have reviewed the data as listed    Component Value Date/Time   NA 137 06/03/2018 0857   K 4.2 06/03/2018 0857   CL 103 06/03/2018 0857   CO2 23 06/03/2018 0857   GLUCOSE 169 (H) 06/03/2018 0857   BUN 25 (H) 06/03/2018 0857   CREATININE 1.59 (H) 06/03/2018 0857   CALCIUM 9.2 06/03/2018 0857   PROT 7.5 06/03/2018 0857   ALBUMIN 3.5 06/03/2018 0857   AST 29 06/03/2018 0857   ALT 18 06/03/2018 0857   ALKPHOS 169 (H) 06/03/2018 0857   BILITOT 0.6 06/03/2018 0857   GFRNONAA 41 (L) 06/03/2018 0857   GFRAA 48 (L) 06/03/2018 0857    No results found for: SPEP, UPEP  Lab Results  Component Value Date   WBC 9.6 06/03/2018   NEUTROABS 7.3 (H) 06/03/2018   HGB 11.2 (L) 06/03/2018   HCT 32.5 (L) 06/03/2018   MCV 88.8 06/03/2018   PLT 258 06/03/2018      Chemistry      Component Value Date/Time   NA 137 06/03/2018 0857   K 4.2 06/03/2018 0857   CL 103 06/03/2018 0857   CO2 23 06/03/2018 0857   BUN 25 (H) 06/03/2018 0857   CREATININE 1.59 (H) 06/03/2018 0857      Component Value Date/Time   CALCIUM 9.2 06/03/2018 0857   ALKPHOS 169 (H) 06/03/2018 0857   AST 29 06/03/2018 0857   ALT 18 06/03/2018 0857   BILITOT 0.6 06/03/2018 0857       RADIOGRAPHIC STUDIES: I have personally reviewed the radiological images as listed and agreed with the findings in the report. No results found.   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  Labs adequate.  #Fevers x2 post gemcitabine;  negative infectious work-up.  Clinically suspect this is gemcitabine induced fevers.  Patient has been started on premedication [Benadryl/Singulair and Tylenol; avoid steroids because of poorly controlled diabetes].  Discussed options of alternative chemotherapy-however other chemotherapy agents like anthracyclines have further limited efficacy in mesothelioma.  Unfortunately not too many chemotherapy options available.  #Colitis/secondary to [facility]; status post infliximab infusions x2; most recent June 3rd.  Resolved.  # CKD- stage III creat stable at baseline creatinine of 1.4/stable  # treatment today; gem/labs-cbc-bmp in 1 week; follow up 3  weeks/labs/gem.     Orders Placed This Encounter  Procedures  . CBC with Differential/Platelet    Standing Status:   Future    Standing Expiration Date:   06/04/2019  . Comprehensive metabolic panel    Standing Status:   Future    Standing Expiration Date:   06/04/2019  . CBC with Differential/Platelet    Standing Status:   Future    Standing Expiration Date:   06/04/2019  . Comprehensive metabolic panel    Standing Status:   Future    Standing Expiration Date:   06/04/2019   All questions were answered. The patient knows to call the clinic with any problems, questions or concerns.      Cammie Sickle, MD 06/06/2018 9:29 AM

## 2018-06-03 NOTE — Progress Notes (Signed)
Proceed with treatment today per Dr. Brahmanday 

## 2018-06-03 NOTE — Assessment & Plan Note (Addendum)
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  Labs adequate.  #Fevers x2 post gemcitabine; negative infectious work-up.  Clinically suspect this is gemcitabine induced fevers.  Patient has been started on premedication [Benadryl/Singulair and Tylenol; avoid steroids because of poorly controlled diabetes].  Discussed options of alternative chemotherapy-however other chemotherapy agents like anthracyclines have further limited efficacy in mesothelioma.  Unfortunately not too many chemotherapy options available.  #Colitis/secondary to [facility]; status post infliximab infusions x2; most recent June 3rd.  Resolved.  # CKD- stage III creat stable at baseline creatinine of 1.4/stable  # treatment today; gem/labs-cbc-bmp in 1 week; follow up 3 weeks/labs/gem.

## 2018-06-04 ENCOUNTER — Telehealth: Payer: Self-pay | Admitting: Internal Medicine

## 2018-06-04 NOTE — Telephone Encounter (Signed)
FYI- Called pt to check on pt- had one "temp"- 99 yesterday afternoon- post chemo/ resoled with tylenol; took medication as recommended; slept well last night. Feels good this AM.  Follow up as planned.

## 2018-06-05 ENCOUNTER — Telehealth: Payer: Self-pay | Admitting: *Deleted

## 2018-06-05 NOTE — Telephone Encounter (Signed)
Per md- patient informed to hold Tylenol at this time unless running a Fever greater than 100. Patient may continue to take singular and benadryl this weekend at bedtime tonight, Saturday and Sunday. Pt instructed to contact our office back if fever occurs. He will also contact our office on Monday to f/u on symptoms.

## 2018-06-05 NOTE — Telephone Encounter (Signed)
Patient called triage to say that he is concerned because he has not been feeling the best today (very tired) and his temperature has been elevated today. His temp was 99.4 and 99.1 when he checked it today. He took 500 mg of Tylenol at 6:00 AM and again at 3:00 PM. He wants to know if he should do anything else. Please advise.

## 2018-06-10 ENCOUNTER — Inpatient Hospital Stay: Payer: Medicare Other

## 2018-06-10 ENCOUNTER — Other Ambulatory Visit: Payer: Self-pay

## 2018-06-10 VITALS — BP 145/80 | HR 62 | Temp 96.2°F | Resp 20 | Wt 186.5 lb

## 2018-06-10 DIAGNOSIS — C459 Mesothelioma, unspecified: Secondary | ICD-10-CM

## 2018-06-10 DIAGNOSIS — Z5111 Encounter for antineoplastic chemotherapy: Secondary | ICD-10-CM | POA: Diagnosis not present

## 2018-06-10 LAB — CBC WITH DIFFERENTIAL/PLATELET
BASOS ABS: 0 10*3/uL (ref 0–0.1)
Basophils Relative: 0 %
EOS ABS: 0.1 10*3/uL (ref 0–0.7)
EOS PCT: 2 %
HCT: 29.2 % — ABNORMAL LOW (ref 40.0–52.0)
HEMOGLOBIN: 10 g/dL — AB (ref 13.0–18.0)
LYMPHS ABS: 1.1 10*3/uL (ref 1.0–3.6)
LYMPHS PCT: 30 %
MCH: 30.6 pg (ref 26.0–34.0)
MCHC: 34.3 g/dL (ref 32.0–36.0)
MCV: 89.1 fL (ref 80.0–100.0)
Monocytes Absolute: 0.6 10*3/uL (ref 0.2–1.0)
Monocytes Relative: 16 %
NEUTROS PCT: 52 %
Neutro Abs: 1.9 10*3/uL (ref 1.4–6.5)
Platelets: 280 10*3/uL (ref 150–440)
RBC: 3.28 MIL/uL — AB (ref 4.40–5.90)
RDW: 14.2 % (ref 11.5–14.5)
WBC: 3.6 10*3/uL — AB (ref 3.8–10.6)

## 2018-06-10 LAB — COMPREHENSIVE METABOLIC PANEL
ALT: 42 U/L (ref 0–44)
AST: 40 U/L (ref 15–41)
Albumin: 3.3 g/dL — ABNORMAL LOW (ref 3.5–5.0)
Alkaline Phosphatase: 238 U/L — ABNORMAL HIGH (ref 38–126)
Anion gap: 10 (ref 5–15)
BILIRUBIN TOTAL: 0.4 mg/dL (ref 0.3–1.2)
BUN: 21 mg/dL (ref 8–23)
CHLORIDE: 108 mmol/L (ref 98–111)
CO2: 24 mmol/L (ref 22–32)
Calcium: 9.6 mg/dL (ref 8.9–10.3)
Creatinine, Ser: 1.44 mg/dL — ABNORMAL HIGH (ref 0.61–1.24)
GFR calc Af Amer: 54 mL/min — ABNORMAL LOW (ref >60)
GFR, EST NON AFRICAN AMERICAN: 46 mL/min — AB (ref >60)
Glucose, Bld: 168 mg/dL — ABNORMAL HIGH (ref 70–99)
POTASSIUM: 4.4 mmol/L (ref 3.5–5.1)
Sodium: 142 mmol/L (ref 135–145)
TOTAL PROTEIN: 7.1 g/dL (ref 6.5–8.1)

## 2018-06-10 MED ORDER — SODIUM CHLORIDE 0.9 % IV SOLN
2000.0000 mg | Freq: Once | INTRAVENOUS | Status: AC
  Administered 2018-06-10: 2000 mg via INTRAVENOUS
  Filled 2018-06-10: qty 52.6

## 2018-06-10 MED ORDER — ACETAMINOPHEN 325 MG PO TABS
650.0000 mg | ORAL_TABLET | Freq: Once | ORAL | Status: AC
  Administered 2018-06-10: 650 mg via ORAL
  Filled 2018-06-10: qty 2

## 2018-06-10 MED ORDER — PROCHLORPERAZINE MALEATE 10 MG PO TABS
10.0000 mg | ORAL_TABLET | Freq: Once | ORAL | Status: AC
  Administered 2018-06-10: 10 mg via ORAL
  Filled 2018-06-10: qty 1

## 2018-06-10 MED ORDER — DIPHENHYDRAMINE HCL 50 MG/ML IJ SOLN
50.0000 mg | Freq: Once | INTRAMUSCULAR | Status: AC
  Administered 2018-06-10: 50 mg via INTRAVENOUS
  Filled 2018-06-10: qty 1

## 2018-06-10 MED ORDER — SODIUM CHLORIDE 0.9 % IV SOLN
Freq: Once | INTRAVENOUS | Status: AC
  Administered 2018-06-10: 09:00:00 via INTRAVENOUS
  Filled 2018-06-10: qty 1000

## 2018-06-24 ENCOUNTER — Inpatient Hospital Stay: Payer: Medicare Other

## 2018-06-24 ENCOUNTER — Other Ambulatory Visit: Payer: Self-pay

## 2018-06-24 ENCOUNTER — Inpatient Hospital Stay (HOSPITAL_BASED_OUTPATIENT_CLINIC_OR_DEPARTMENT_OTHER): Payer: Medicare Other | Admitting: Internal Medicine

## 2018-06-24 VITALS — BP 133/78 | HR 73 | Temp 97.1°F | Resp 16 | Wt 190.2 lb

## 2018-06-24 DIAGNOSIS — E119 Type 2 diabetes mellitus without complications: Secondary | ICD-10-CM

## 2018-06-24 DIAGNOSIS — I129 Hypertensive chronic kidney disease with stage 1 through stage 4 chronic kidney disease, or unspecified chronic kidney disease: Secondary | ICD-10-CM | POA: Diagnosis not present

## 2018-06-24 DIAGNOSIS — Z79899 Other long term (current) drug therapy: Secondary | ICD-10-CM

## 2018-06-24 DIAGNOSIS — C459 Mesothelioma, unspecified: Secondary | ICD-10-CM

## 2018-06-24 DIAGNOSIS — K529 Noninfective gastroenteritis and colitis, unspecified: Secondary | ICD-10-CM

## 2018-06-24 DIAGNOSIS — Z5111 Encounter for antineoplastic chemotherapy: Secondary | ICD-10-CM | POA: Diagnosis not present

## 2018-06-24 DIAGNOSIS — N183 Chronic kidney disease, stage 3 (moderate): Secondary | ICD-10-CM | POA: Diagnosis not present

## 2018-06-24 DIAGNOSIS — R509 Fever, unspecified: Secondary | ICD-10-CM

## 2018-06-24 LAB — CBC WITH DIFFERENTIAL/PLATELET
Basophils Absolute: 0.1 10*3/uL (ref 0–0.1)
Basophils Relative: 1 %
EOS ABS: 0.1 10*3/uL (ref 0–0.7)
EOS PCT: 2 %
HCT: 30.8 % — ABNORMAL LOW (ref 40.0–52.0)
Hemoglobin: 10.5 g/dL — ABNORMAL LOW (ref 13.0–18.0)
LYMPHS ABS: 1 10*3/uL (ref 1.0–3.6)
Lymphocytes Relative: 15 %
MCH: 30.7 pg (ref 26.0–34.0)
MCHC: 34 g/dL (ref 32.0–36.0)
MCV: 90.3 fL (ref 80.0–100.0)
MONOS PCT: 10 %
Monocytes Absolute: 0.7 10*3/uL (ref 0.2–1.0)
Neutro Abs: 4.6 10*3/uL (ref 1.4–6.5)
Neutrophils Relative %: 72 %
PLATELETS: 409 10*3/uL (ref 150–440)
RBC: 3.41 MIL/uL — ABNORMAL LOW (ref 4.40–5.90)
RDW: 17.2 % — ABNORMAL HIGH (ref 11.5–14.5)
WBC: 6.5 10*3/uL (ref 3.8–10.6)

## 2018-06-24 LAB — COMPREHENSIVE METABOLIC PANEL
ALT: 20 U/L (ref 0–44)
ANION GAP: 11 (ref 5–15)
AST: 30 U/L (ref 15–41)
Albumin: 3.6 g/dL (ref 3.5–5.0)
Alkaline Phosphatase: 161 U/L — ABNORMAL HIGH (ref 38–126)
BUN: 19 mg/dL (ref 8–23)
CHLORIDE: 106 mmol/L (ref 98–111)
CO2: 25 mmol/L (ref 22–32)
Calcium: 9.5 mg/dL (ref 8.9–10.3)
Creatinine, Ser: 1.45 mg/dL — ABNORMAL HIGH (ref 0.61–1.24)
GFR calc non Af Amer: 46 mL/min — ABNORMAL LOW (ref >60)
GFR, EST AFRICAN AMERICAN: 53 mL/min — AB (ref >60)
Glucose, Bld: 154 mg/dL — ABNORMAL HIGH (ref 70–99)
Potassium: 4.4 mmol/L (ref 3.5–5.1)
SODIUM: 142 mmol/L (ref 135–145)
Total Bilirubin: 0.5 mg/dL (ref 0.3–1.2)
Total Protein: 7.4 g/dL (ref 6.5–8.1)

## 2018-06-24 MED ORDER — ACETAMINOPHEN 325 MG PO TABS
650.0000 mg | ORAL_TABLET | Freq: Once | ORAL | Status: AC
  Administered 2018-06-24: 650 mg via ORAL
  Filled 2018-06-24: qty 2

## 2018-06-24 MED ORDER — PROCHLORPERAZINE MALEATE 10 MG PO TABS
10.0000 mg | ORAL_TABLET | Freq: Once | ORAL | Status: AC
  Administered 2018-06-24: 10 mg via ORAL
  Filled 2018-06-24: qty 1

## 2018-06-24 MED ORDER — SODIUM CHLORIDE 0.9 % IV SOLN
Freq: Once | INTRAVENOUS | Status: AC
  Administered 2018-06-24: 11:00:00 via INTRAVENOUS
  Filled 2018-06-24: qty 1000

## 2018-06-24 MED ORDER — DIPHENHYDRAMINE HCL 50 MG/ML IJ SOLN
50.0000 mg | Freq: Once | INTRAMUSCULAR | Status: AC
  Administered 2018-06-24: 50 mg via INTRAVENOUS
  Filled 2018-06-24: qty 1

## 2018-06-24 MED ORDER — SODIUM CHLORIDE 0.9 % IV SOLN
2000.0000 mg | Freq: Once | INTRAVENOUS | Status: AC
  Administered 2018-06-24: 2000 mg via INTRAVENOUS
  Filled 2018-06-24: qty 52.6

## 2018-06-24 NOTE — Progress Notes (Signed)
[place] OFFICE PROGRESS NOTE  Patient Care Team: Derinda Late, MD as PCP - General (Family Medicine) Margaretha Sheffield, MD (Otolaryngology) Telford Nab, RN as Registered Nurse Nestor Lewandowsky, MD as Referring Physician (Cardiothoracic Surgery) Cammie Sickle, MD as Consulting Physician (Internal Medicine)  Cancer Staging No matching staging information was found for the patient.   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   # JUNE 2019- GEM  # Hx of CVA Right hand; no residual deficits [left sided carotid block; no surgery done]- on Asa/plavix; CKD- stage III; DM-2; ? PN  # MOLECULAR TESTING- PDL-1- 1% [LOW]; [facility]- No actionable**  -----------------------------------   DIAGNOSIS: [AUG 2019 ] ? [name]   STAGE:  IV  ;GOALS: Palliative  CURRENT/MOST RECENT THERAPY- June 2019- GEM     [name], malignant [facility])      INTERVAL HISTORY:  Ian Shaffer 72 y.o.  male pleasant patient above history of malignant mesothelioma currently on gemcitabine is here for follow-up.  Patient has not had any fevers.  Denies any chills.  Denies any diarrhea.  Appetite is fair.  Review of Systems  Constitutional: Positive for malaise/fatigue.  Negative for chills, diaphoresis, fever and weight loss.  HENT: Negative for nosebleeds and sore throat.   Eyes: Negative for double vision.  Respiratory: Negative for cough, hemoptysis, sputum production, shortness of breath and wheezing.   Cardiovascular: Negative for chest pain, palpitations, orthopnea and leg swelling.  Gastrointestinal: Negative for abdominal pain, blood in stool, constipation, diarrhea, heartburn, melena, nausea and vomiting.  Genitourinary: Negative for dysuria, frequency and urgency.  Musculoskeletal: Negative for back pain and joint pain.  Skin: Negative.  Negative for itching and rash.  Neurological: Negative for dizziness, tingling, focal weakness, weakness and headaches.  Endo/Heme/Allergies: Does not bruise/bleed easily.  Psychiatric/Behavioral: Negative for depression. The patient is not nervous/anxious and does not have insomnia.       PAST MEDICAL HISTORY :  Past Medical History:  Diagnosis Date  . Benign essential hypertension 05/31/2014  . Cancer ([place]) 06/2017   right lung   . Cerebral infarction ([place]) 05/31/2014  . Cerebrovascular disease 05/31/2014  . CKD (chronic kidney disease) stage 3, GFR 30-59 ml/min (HCC) 02/23/2016  . CVA (cerebral infarction)   . Diabetes mellitus without complication ([place])   . Difficult intubation   . DVT (deep venous thrombosis) ([place])   . Esophageal reflux 05/31/2014  . H/O: CVA (cerebrovascular accident)   . Hyperlipidemia   . Hypertension   . ICAO (internal carotid artery occlusion) March 01, 2014   Left  . 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 Pleural effusion 04/23/2017  . Stroke [facility]) April, 4,2015  . Type 2 diabetes mellitus with peripheral neuropathy ([place]) 02/23/2016  . Weakness     PAST SURGICAL HISTORY :   Past Surgical History:  Procedure Laterality Date  . CHEST TUBE INSERTION  N/A 06/25/2017   Procedure: HENIDP CATHETER INSERTION;  Surgeon: Nestor Lewandowsky, MD;  Location: [facility] ORS;   Service: Thoracic;  Laterality: N/A;  . CHEST TUBE INSERTION Right 08/07/2017   Procedure: REMOVAL OF PLEUR X CATHETER;  Surgeon: Nestor Lewandowsky, MD;  Location: [facility] ORS;  Service: General;  Laterality: Right;  . NASAL SINUS SURGERY  2000  . TONSILLECTOMY    . VIDEO ASSISTED THORACOSCOPY Right 06/25/2017   Procedure: VIDEO ASSISTED THORACOSCOPY WITH TALC PLEURODESIS, CHEST TUBE INSERTION;  Surgeon: Nestor Lewandowsky, MD;  Location: [facility] ORS;  Service: Thoracic;  Laterality: Right;    FAMILY HISTORY :   Family History  Problem Relation Age of Onset  . Hypertension Mother   . Hypertension Father     SOCIAL HISTORY:   Social History   Tobacco Use  . Smoking status: Former Smoker    Types: Pipe    Last attempt to quit: 05/07/1974    Years since quitting: 44.1  . Smokeless tobacco: Never Used  . Tobacco comment: pt states he smoked a pipe a long time ago  Substance Use Topics  . Alcohol use: No  . Drug use: No    ALLERGIES:  is allergic to sulfa antibiotics; adhesive [tape]; amoxicillin; and other.  MEDICATIONS:  Current Outpatient Medications  Medication Sig Dispense Refill  . acetaminophen (TYLENOL) 325 MG tablet Take 2 tablets (650 mg total) by mouth every 6 (six) hours as needed for mild pain (or Fever >/= 101).    Marland Kitchen amLODipine (NORVASC) 5 MG tablet Take 1 tablet by mouth daily.    Marland Kitchen aspirin EC 81 MG tablet Take 81 mg by mouth daily.    . clopidogrel (PLAVIX) 75 MG tablet Take 75 mg by mouth daily.    . fluticasone (FLONASE) 50 MCG/ACT nasal spray Place 1 spray into both nostrils daily at 2 PM.     . liraglutide (VICTOZA) 18 MG/3ML SOPN Inject 1.2 mg into the skin daily.    . metoprolol succinate (TOPROL-XL) 25 MG 24 hr tablet Take 25 mg by mouth daily.    . montelukast (SINGULAIR) 10 MG tablet Take 1 tablet (10 mg total) by mouth at bedtime. Take 1 tablet-10 mg night prior x 3 days. 40 tablet 3  . Multiple Vitamins-Minerals (CENTRUM SILVER PO) Take 1 tablet by mouth daily.    .  ondansetron (ZOFRAN) 8 MG tablet Take 8 mg by mouth every 8 (eight) hours as needed for nausea or refractory nausea / vomiting.     . polyethylene glycol powder (GLYCOLAX/MIRALAX) powder Take 17 g by mouth daily as needed (for constipation.).    Marland Kitchen pravastatin (PRAVACHOL) 40 MG tablet Take 40 mg by mouth daily.    . prochlorperazine (COMPAZINE) 10 MG tablet Take 10 mg by mouth every 8 (eight) hours as needed for vomiting.     . triamcinolone cream (KENALOG) 0.1 % Apply topically 2 (two) times daily. 30 g 0  . lansoprazole (PREVACID) 30 MG capsule TAKE 1 CAPSULE EVERY DAY BEFORE BREAKFAST 30 capsule 4   No current facility-administered medications for this visit.     PHYSICAL EXAMINATION: ECOG PERFORMANCE STATUS: 1 - Symptomatic but completely ambulatory  BP 133/78 (BP Location: Left Arm, Patient Position: Sitting)   Pulse 73   Temp (!) 97.1 F (36.2 C) (Tympanic)   Resp 16   Wt 190 lb 3.2 oz (86.3 kg)   BMI 27.29 kg/m   Filed Weights   06/24/18 0851  Weight: 190 lb 3.2 oz (86.3 kg)  GENERAL: Well-nourished well-developed; Alert, no distress and comfortable.  Accompanied by his wife. EYES: no pallor or icterus OROPHARYNX: no thrush or ulceration; NECK: supple; no lymph nodes felt. LYMPH:  no palpable lymphadenopathy in the axillary or inguinal regions LUNGS: Decreased breath sounds auscultation bilaterally. No wheeze or crackles HEART/CVS: regular rate & rhythm and no murmurs; No lower extremity edema ABDOMEN:abdomen soft, non-tender and normal bowel sounds. No hepatomegaly or splenomegaly.  Musculoskeletal:no cyanosis of digits and no clubbing  PSYCH: alert & oriented x 3 with fluent speech NEURO: no focal motor/sensory deficits SKIN:  no rashes or significant lesions    LABORATORY DATA:  I have reviewed the data as listed    Component Value Date/Time   NA 136 07/01/2018 1301   K 4.6 07/01/2018 1301   CL 103 07/01/2018 1301   CO2 23 07/01/2018 1301   GLUCOSE 210 (H)  07/01/2018 1301   BUN 34 (H) 07/01/2018 1301   CREATININE 1.52 (H) 07/01/2018 1301   CALCIUM 9.4 07/01/2018 1301   PROT 7.4 06/24/2018 0833   ALBUMIN 3.6 06/24/2018 0833   AST 30 06/24/2018 0833   ALT 20 06/24/2018 0833   ALKPHOS 161 (H) 06/24/2018 0833   BILITOT 0.5 06/24/2018 0833   GFRNONAA 43 (L) 07/01/2018 1301   GFRAA 50 (L) 07/01/2018 1301    No results found for: SPEP, UPEP  Lab Results  Component Value Date   WBC 5.7 07/01/2018   NEUTROABS 4.1 07/01/2018   HGB 10.4 (L) 07/01/2018   HCT 31.1 (L) 07/01/2018   MCV 91.8 07/01/2018   PLT 401 07/01/2018      Chemistry      Component Value Date/Time   NA 136 07/01/2018 1301   K 4.6 07/01/2018 1301   CL 103 07/01/2018 1301   CO2 23 07/01/2018 1301   BUN 34 (H) 07/01/2018 1301   CREATININE 1.52 (H) 07/01/2018 1301      Component Value Date/Time   CALCIUM 9.4 07/01/2018 1301   ALKPHOS 161 (H) 06/24/2018 0833   AST 30 06/24/2018 0833   ALT 20 06/24/2018 0833   BILITOT 0.5 06/24/2018 0833       RADIOGRAPHIC STUDIES: I have personally reviewed the radiological images as listed and agreed with the findings in the report. No results found.   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  Labs adequate.  We will plan to get a CT scan after 4 cycles.  # Fevers x2 post gemcitabine-stable.  # Restless leg sec to benadryl-new.  Recommend decreasing to 80m.   # CKD- stage III creat-stable  # treatment today; gem/labs-cbc-bmp in 1 week; follow up 3 weeks/labs/gem.     Orders Placed This Encounter  Procedures  . CBC with Differential    Standing Status:   Future    Number of Occurrences:   1    Standing Expiration Date:   06/24/2019  . Basic metabolic panel    Standing Status:   Future    Number of Occurrences:   1    Standing Expiration Date:   06/24/2019  . CBC with Differential    Standing  Status:   Future    Standing Expiration Date:   06/24/2019  . Comprehensive metabolic panel    Standing Status:   Future    Standing Expiration Date:   06/24/2019   All questions were answered. The patient knows to call the clinic with any  problems, questions or concerns.      Cammie Sickle, MD 07/05/2018 7:01 PM

## 2018-06-24 NOTE — Assessment & Plan Note (Addendum)
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  Labs adequate.  We will plan to get a CT scan after 4 cycles.  # Fevers x2 post gemcitabine-stable.  # Restless leg sec to benadryl-new.  Recommend decreasing to 25mg .   # CKD- stage III creat-stable  # treatment today; gem/labs-cbc-bmp in 1 week; follow up 3 weeks/labs/gem.

## 2018-06-26 ENCOUNTER — Other Ambulatory Visit: Payer: Self-pay | Admitting: Internal Medicine

## 2018-07-01 ENCOUNTER — Inpatient Hospital Stay: Payer: Medicare Other | Attending: Internal Medicine

## 2018-07-01 ENCOUNTER — Inpatient Hospital Stay: Payer: Medicare Other

## 2018-07-01 VITALS — BP 176/68 | HR 70 | Temp 98.2°F | Resp 20 | Wt 189.1 lb

## 2018-07-01 DIAGNOSIS — Z87891 Personal history of nicotine dependence: Secondary | ICD-10-CM | POA: Diagnosis not present

## 2018-07-01 DIAGNOSIS — E785 Hyperlipidemia, unspecified: Secondary | ICD-10-CM | POA: Diagnosis not present

## 2018-07-01 DIAGNOSIS — E1122 Type 2 diabetes mellitus with diabetic chronic kidney disease: Secondary | ICD-10-CM | POA: Insufficient documentation

## 2018-07-01 DIAGNOSIS — Z7982 Long term (current) use of aspirin: Secondary | ICD-10-CM | POA: Insufficient documentation

## 2018-07-01 DIAGNOSIS — I129 Hypertensive chronic kidney disease with stage 1 through stage 4 chronic kidney disease, or unspecified chronic kidney disease: Secondary | ICD-10-CM | POA: Insufficient documentation

## 2018-07-01 DIAGNOSIS — Z8673 Personal history of transient ischemic attack (TIA), and cerebral infarction without residual deficits: Secondary | ICD-10-CM | POA: Insufficient documentation

## 2018-07-01 DIAGNOSIS — C459 Mesothelioma, unspecified: Secondary | ICD-10-CM

## 2018-07-01 DIAGNOSIS — Z86718 Personal history of other venous thrombosis and embolism: Secondary | ICD-10-CM | POA: Diagnosis not present

## 2018-07-01 DIAGNOSIS — Z79899 Other long term (current) drug therapy: Secondary | ICD-10-CM | POA: Diagnosis not present

## 2018-07-01 DIAGNOSIS — Z5111 Encounter for antineoplastic chemotherapy: Secondary | ICD-10-CM | POA: Diagnosis not present

## 2018-07-01 DIAGNOSIS — N183 Chronic kidney disease, stage 3 (moderate): Secondary | ICD-10-CM | POA: Diagnosis not present

## 2018-07-01 DIAGNOSIS — K219 Gastro-esophageal reflux disease without esophagitis: Secondary | ICD-10-CM | POA: Insufficient documentation

## 2018-07-01 LAB — CBC WITH DIFFERENTIAL/PLATELET
BASOS ABS: 0 10*3/uL (ref 0–0.1)
Basophils Relative: 0 %
Eosinophils Absolute: 0 10*3/uL (ref 0–0.7)
Eosinophils Relative: 1 %
HEMATOCRIT: 31.1 % — AB (ref 40.0–52.0)
HEMOGLOBIN: 10.4 g/dL — AB (ref 13.0–18.0)
LYMPHS PCT: 11 %
Lymphs Abs: 0.6 10*3/uL — ABNORMAL LOW (ref 1.0–3.6)
MCH: 30.7 pg (ref 26.0–34.0)
MCHC: 33.5 g/dL (ref 32.0–36.0)
MCV: 91.8 fL (ref 80.0–100.0)
MONOS PCT: 15 %
Monocytes Absolute: 0.9 10*3/uL (ref 0.2–1.0)
NEUTROS ABS: 4.1 10*3/uL (ref 1.4–6.5)
Neutrophils Relative %: 73 %
Platelets: 401 10*3/uL (ref 150–440)
RBC: 3.38 MIL/uL — ABNORMAL LOW (ref 4.40–5.90)
RDW: 17.3 % — ABNORMAL HIGH (ref 11.5–14.5)
WBC: 5.7 10*3/uL (ref 3.8–10.6)

## 2018-07-01 LAB — BASIC METABOLIC PANEL
ANION GAP: 10 (ref 5–15)
BUN: 34 mg/dL — ABNORMAL HIGH (ref 8–23)
CO2: 23 mmol/L (ref 22–32)
Calcium: 9.4 mg/dL (ref 8.9–10.3)
Chloride: 103 mmol/L (ref 98–111)
Creatinine, Ser: 1.52 mg/dL — ABNORMAL HIGH (ref 0.61–1.24)
GFR calc Af Amer: 50 mL/min — ABNORMAL LOW (ref >60)
GFR, EST NON AFRICAN AMERICAN: 43 mL/min — AB (ref >60)
GLUCOSE: 210 mg/dL — AB (ref 70–99)
POTASSIUM: 4.6 mmol/L (ref 3.5–5.1)
Sodium: 136 mmol/L (ref 135–145)

## 2018-07-01 MED ORDER — ACETAMINOPHEN 325 MG PO TABS
650.0000 mg | ORAL_TABLET | Freq: Once | ORAL | Status: AC
  Administered 2018-07-01: 650 mg via ORAL
  Filled 2018-07-01: qty 2

## 2018-07-01 MED ORDER — SODIUM CHLORIDE 0.9 % IV SOLN
2000.0000 mg | Freq: Once | INTRAVENOUS | Status: AC
  Administered 2018-07-01: 2000 mg via INTRAVENOUS
  Filled 2018-07-01: qty 52.6

## 2018-07-01 MED ORDER — PROCHLORPERAZINE MALEATE 10 MG PO TABS
10.0000 mg | ORAL_TABLET | Freq: Once | ORAL | Status: AC
  Administered 2018-07-01: 10 mg via ORAL
  Filled 2018-07-01: qty 1

## 2018-07-01 MED ORDER — SODIUM CHLORIDE 0.9 % IV SOLN
Freq: Once | INTRAVENOUS | Status: AC
  Administered 2018-07-01: 14:00:00 via INTRAVENOUS
  Filled 2018-07-01: qty 1000

## 2018-07-01 MED ORDER — DIPHENHYDRAMINE HCL 50 MG/ML IJ SOLN
50.0000 mg | Freq: Once | INTRAMUSCULAR | Status: AC
  Administered 2018-07-01: 50 mg via INTRAVENOUS
  Filled 2018-07-01: qty 1

## 2018-07-15 ENCOUNTER — Inpatient Hospital Stay (HOSPITAL_BASED_OUTPATIENT_CLINIC_OR_DEPARTMENT_OTHER): Payer: Medicare Other | Admitting: Internal Medicine

## 2018-07-15 ENCOUNTER — Ambulatory Visit: Payer: Medicare Other | Admitting: Internal Medicine

## 2018-07-15 ENCOUNTER — Inpatient Hospital Stay: Payer: Medicare Other

## 2018-07-15 ENCOUNTER — Encounter: Payer: Self-pay | Admitting: Internal Medicine

## 2018-07-15 ENCOUNTER — Ambulatory Visit: Payer: Medicare Other

## 2018-07-15 ENCOUNTER — Other Ambulatory Visit: Payer: Medicare Other

## 2018-07-15 VITALS — BP 152/64 | Temp 97.8°F | Resp 16 | Wt 189.6 lb

## 2018-07-15 DIAGNOSIS — C459 Mesothelioma, unspecified: Secondary | ICD-10-CM

## 2018-07-15 DIAGNOSIS — K219 Gastro-esophageal reflux disease without esophagitis: Secondary | ICD-10-CM | POA: Diagnosis not present

## 2018-07-15 DIAGNOSIS — I129 Hypertensive chronic kidney disease with stage 1 through stage 4 chronic kidney disease, or unspecified chronic kidney disease: Secondary | ICD-10-CM

## 2018-07-15 DIAGNOSIS — N183 Chronic kidney disease, stage 3 (moderate): Secondary | ICD-10-CM

## 2018-07-15 DIAGNOSIS — C801 Malignant (primary) neoplasm, unspecified: Secondary | ICD-10-CM

## 2018-07-15 DIAGNOSIS — Z5111 Encounter for antineoplastic chemotherapy: Secondary | ICD-10-CM | POA: Diagnosis not present

## 2018-07-15 DIAGNOSIS — Z79899 Other long term (current) drug therapy: Secondary | ICD-10-CM

## 2018-07-15 LAB — COMPREHENSIVE METABOLIC PANEL
ALT: 24 U/L (ref 0–44)
AST: 25 U/L (ref 15–41)
Albumin: 3.6 g/dL (ref 3.5–5.0)
Alkaline Phosphatase: 195 U/L — ABNORMAL HIGH (ref 38–126)
Anion gap: 6 (ref 5–15)
BUN: 33 mg/dL — ABNORMAL HIGH (ref 8–23)
CO2: 24 mmol/L (ref 22–32)
Calcium: 9.5 mg/dL (ref 8.9–10.3)
Chloride: 108 mmol/L (ref 98–111)
Creatinine, Ser: 1.4 mg/dL — ABNORMAL HIGH (ref 0.61–1.24)
GFR calc Af Amer: 56 mL/min — ABNORMAL LOW (ref >60)
GFR calc non Af Amer: 48 mL/min — ABNORMAL LOW (ref >60)
Glucose, Bld: 215 mg/dL — ABNORMAL HIGH (ref 70–99)
Potassium: 4.4 mmol/L (ref 3.5–5.1)
Sodium: 138 mmol/L (ref 135–145)
Total Bilirubin: 0.5 mg/dL (ref 0.3–1.2)
Total Protein: 7.4 g/dL (ref 6.5–8.1)

## 2018-07-15 LAB — CBC WITH DIFFERENTIAL/PLATELET
Basophils Absolute: 0 10*3/uL (ref 0–0.1)
Basophils Relative: 0 %
Eosinophils Absolute: 0 10*3/uL (ref 0–0.7)
Eosinophils Relative: 0 %
HCT: 30.8 % — ABNORMAL LOW (ref 40.0–52.0)
Hemoglobin: 10.6 g/dL — ABNORMAL LOW (ref 13.0–18.0)
Lymphocytes Relative: 7 %
Lymphs Abs: 0.8 10*3/uL — ABNORMAL LOW (ref 1.0–3.6)
MCH: 31.6 pg (ref 26.0–34.0)
MCHC: 34.3 g/dL (ref 32.0–36.0)
MCV: 92.1 fL (ref 80.0–100.0)
Monocytes Absolute: 0.7 10*3/uL (ref 0.2–1.0)
Monocytes Relative: 6 %
Neutro Abs: 9.8 10*3/uL — ABNORMAL HIGH (ref 1.4–6.5)
Neutrophils Relative %: 87 %
Platelets: 328 10*3/uL (ref 150–440)
RBC: 3.35 MIL/uL — ABNORMAL LOW (ref 4.40–5.90)
RDW: 19.9 % — ABNORMAL HIGH (ref 11.5–14.5)
WBC: 11.3 10*3/uL — ABNORMAL HIGH (ref 3.8–10.6)

## 2018-07-15 MED ORDER — SODIUM CHLORIDE 0.9 % IV SOLN
Freq: Once | INTRAVENOUS | Status: AC
  Administered 2018-07-15: 11:00:00 via INTRAVENOUS
  Filled 2018-07-15: qty 250

## 2018-07-15 MED ORDER — DIPHENHYDRAMINE HCL 50 MG/ML IJ SOLN
50.0000 mg | Freq: Once | INTRAMUSCULAR | Status: AC
  Administered 2018-07-15: 50 mg via INTRAVENOUS
  Filled 2018-07-15: qty 1

## 2018-07-15 MED ORDER — ACETAMINOPHEN 325 MG PO TABS
650.0000 mg | ORAL_TABLET | Freq: Once | ORAL | Status: AC
  Administered 2018-07-15: 650 mg via ORAL
  Filled 2018-07-15: qty 2

## 2018-07-15 MED ORDER — SODIUM CHLORIDE 0.9 % IV SOLN
2000.0000 mg | Freq: Once | INTRAVENOUS | Status: AC
  Administered 2018-07-15: 2000 mg via INTRAVENOUS
  Filled 2018-07-15: qty 52.6

## 2018-07-15 MED ORDER — SODIUM CHLORIDE 0.9 % IV SOLN
Freq: Once | INTRAVENOUS | Status: AC
  Administered 2018-07-15: 10:00:00 via INTRAVENOUS
  Filled 2018-07-15: qty 250

## 2018-07-15 MED ORDER — PROCHLORPERAZINE MALEATE 10 MG PO TABS
10.0000 mg | ORAL_TABLET | Freq: Once | ORAL | Status: AC
  Administered 2018-07-15: 10 mg via ORAL
  Filled 2018-07-15: qty 1

## 2018-07-15 NOTE — Assessment & Plan Note (Addendum)
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 STABLE  # Proceed with cycle number 4-day 1 of gemcitabine today.  Labs adequate.  We will plan to get a CT scan after this cycle.   # Reflux- recommend PPI.  Improved.  # CKD- stage III creat-STABLE.   # treatment today; gem/labs-cbc-bmp in 1 week; follow up 3 weeks/labs/gem.  CT scan prior.

## 2018-07-15 NOTE — Progress Notes (Signed)
[place] OFFICE PROGRESS NOTE  Patient Care Team: Derinda Late, MD as PCP - General (Family Medicine) Margaretha Sheffield, MD (Otolaryngology) Telford Nab, RN as Registered Nurse Nestor Lewandowsky, MD as Referring Physician (Cardiothoracic Surgery) Cammie Sickle, MD as Consulting Physician (Internal Medicine)  Cancer Staging No matching staging information was found for the patient.   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   # JUNE 2019- GEM  # Hx of CVA Right hand; no residual deficits [left sided carotid block; no surgery done]- on Asa/plavix; CKD- stage III; DM-2; ? PN  # MOLECULAR TESTING- PDL-1- 1% [LOW]; [facility]- No actionable**  -----------------------------------   DIAGNOSIS: [AUG 2019 ] ? 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  male pleasant patient above history of likely mesothelioma currently on gemcitabine chemotherapy status post cycle #3 is here for follow-up.  In the interim patient had a beach trip-trip was uneventful.  Patient had episode of heartburn for which she took Nexium twice a day  currently improved.  One episode of fever can resolve.  Review of Systems  Constitutional: Positive for fever. Negative for chills, diaphoresis, malaise/fatigue and weight loss.  HENT: Negative for nosebleeds and sore throat.   Eyes: Negative for double vision.  Respiratory: Negative for cough, hemoptysis, sputum production, shortness of breath and wheezing.   Cardiovascular: Negative for chest pain, palpitations, orthopnea and leg swelling.  Gastrointestinal: Positive for heartburn. Negative for abdominal pain, blood in stool, constipation, diarrhea, melena and vomiting.  Genitourinary: Negative for dysuria, frequency and urgency.  Musculoskeletal: Negative for back pain and joint pain.  Skin: Negative.  Negative for itching and rash.  Neurological: Negative for dizziness, tingling, focal weakness, weakness and headaches.  Endo/Heme/Allergies: Does not bruise/bleed easily.  Psychiatric/Behavioral: Negative for depression. The patient is not nervous/anxious and does not have insomnia.       PAST MEDICAL HISTORY :  Past Medical History:  Diagnosis Date  . Benign essential hypertension 05/31/2014  . Cancer ([place]) 06/2017   right lung   . Cerebral infarction ([place]) 05/31/2014  . Cerebrovascular disease 05/31/2014  . CKD (chronic kidney disease) stage 3, GFR 30-59 ml/min (HCC) 02/23/2016  . CVA (cerebral infarction)   . Diabetes mellitus without complication ([place])   . Difficult intubation   . DVT (deep venous thrombosis) ([place])   . Esophageal reflux 05/31/2014  . H/O: CVA (cerebrovascular accident)   . Hyperlipidemia   . Hypertension   . ICAO (internal carotid artery occlusion) March 01, 2014   Left  . 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 Pleural effusion 04/23/2017  . Stroke [facility]) April, 4,2015  . Type 2 diabetes mellitus with peripheral neuropathy ([place]) 02/23/2016  . Weakness  PAST SURGICAL HISTORY :   Past Surgical History:  Procedure Laterality Date  .  CHEST TUBE INSERTION N/A 06/25/2017   Procedure: PFYTWK CATHETER INSERTION;  Surgeon: Nestor Lewandowsky, MD;  Location: [facility] ORS;  Service: Thoracic;  Laterality: N/A;  . CHEST TUBE INSERTION Right 08/07/2017   Procedure: REMOVAL OF PLEUR X CATHETER;  Surgeon: Nestor Lewandowsky, MD;  Location: [facility] ORS;  Service: General;  Laterality: Right;  . NASAL SINUS SURGERY  2000  . TONSILLECTOMY    . VIDEO ASSISTED THORACOSCOPY Right 06/25/2017   Procedure: VIDEO ASSISTED THORACOSCOPY WITH TALC PLEURODESIS, CHEST TUBE INSERTION;  Surgeon: Nestor Lewandowsky, MD;  Location: [facility] ORS;  Service: Thoracic;  Laterality: Right;    FAMILY HISTORY :   Family History  Problem Relation Age of Onset  . Hypertension Mother   . Hypertension Father     SOCIAL HISTORY:   Social History   Tobacco Use  . Smoking status: Former Smoker    Types: Pipe    Last attempt to quit: 05/07/1974    Years since quitting: 44.2  . Smokeless tobacco: Never Used  . Tobacco comment: pt states he smoked a pipe a long time ago  Substance Use Topics  . Alcohol use: No  . Drug use: No    ALLERGIES:  is allergic to sulfa antibiotics; adhesive [tape]; amoxicillin; and other.  MEDICATIONS:  Current Outpatient Medications  Medication Sig Dispense Refill  . acetaminophen (TYLENOL) 325 MG tablet Take 2 tablets (650 mg total) by mouth every 6 (six) hours as needed for mild pain (or Fever >/= 101).    Marland Kitchen amLODipine (NORVASC) 5 MG tablet Take 1 tablet by mouth daily.    Marland Kitchen aspirin EC 81 MG tablet Take 81 mg by mouth daily.    . clopidogrel (PLAVIX) 75 MG tablet Take 75 mg by mouth daily.    . fluticasone (FLONASE) 50 MCG/ACT nasal spray Place 1 spray into both nostrils daily at 2 PM.     . lansoprazole (PREVACID) 30 MG capsule TAKE 1 CAPSULE EVERY DAY BEFORE BREAKFAST 30 capsule 4  . liraglutide (VICTOZA) 18 MG/3ML SOPN Inject 1.2 mg into the skin daily.    . metoprolol succinate (TOPROL-XL) 25 MG 24 hr tablet Take 25 mg by mouth daily.    .  montelukast (SINGULAIR) 10 MG tablet Take 1 tablet (10 mg total) by mouth at bedtime. Take 1 tablet-10 mg night prior x 3 days. 40 tablet 3  . Multiple Vitamins-Minerals (CENTRUM SILVER PO) Take 1 tablet by mouth daily.    . ondansetron (ZOFRAN) 8 MG tablet Take 8 mg by mouth every 8 (eight) hours as needed for nausea or refractory nausea / vomiting.     . polyethylene glycol powder (GLYCOLAX/MIRALAX) powder Take 17 g by mouth daily as needed (for constipation.).    Marland Kitchen pravastatin (PRAVACHOL) 40 MG tablet Take 40 mg by mouth daily.    . prochlorperazine (COMPAZINE) 10 MG tablet Take 10 mg by mouth every 8 (eight) hours as needed for vomiting.     . triamcinolone cream (KENALOG) 0.1 % Apply topically 2 (two) times daily. 30 g 0   No current facility-administered medications for this visit.     PHYSICAL EXAMINATION: ECOG PERFORMANCE STATUS: 0 - Asymptomatic  BP (!) 152/64 (BP Location: Left Arm, Patient Position: Sitting)   Temp 97.8 F (36.6 C) (Tympanic)   Resp 16   Wt 189 lb 9.6 oz (86 kg)   BMI 27.20 kg/m   [employer]  07/15/18 0845  Weight: 189 lb 9.6 oz (86 kg)    Physical Exam  Constitutional: He is oriented to person, place, and time and well-developed, well-nourished, and in no distress.  HENT:  Head: Normocephalic and atraumatic.  Mouth/Throat: Oropharynx is clear and moist. No oropharyngeal exudate.  Eyes: Pupils are equal, round, and reactive to light.  Neck: Normal range of motion. Neck supple.  Cardiovascular: Normal rate and regular rhythm.  Pulmonary/Chest: No respiratory distress. He has no wheezes.  Abdominal: Soft. Bowel sounds are normal. He exhibits no distension and no mass. There is no tenderness. There is no rebound and no guarding.  Musculoskeletal: Normal range of motion. He exhibits no edema or tenderness.  Neurological: He is alert and oriented to person, place, and time.  Skin: Skin is warm.  Psychiatric: Affect normal.       LABORATORY  DATA:  I have reviewed the data as listed    Component Value Date/Time   NA 138 07/15/2018 0800   K 4.4 07/15/2018 0800   CL 108 07/15/2018 0800   CO2 24 07/15/2018 0800   GLUCOSE 215 (H) 07/15/2018 0800   BUN 33 (H) 07/15/2018 0800   CREATININE 1.40 (H) 07/15/2018 0800   CALCIUM 9.5 07/15/2018 0800   PROT 7.4 07/15/2018 0800   ALBUMIN 3.6 07/15/2018 0800   AST 25 07/15/2018 0800   ALT 24 07/15/2018 0800   ALKPHOS 195 (H) 07/15/2018 0800   BILITOT 0.5 07/15/2018 0800   GFRNONAA 48 (L) 07/15/2018 0800   GFRAA 56 (L) 07/15/2018 0800    No results found for: SPEP, UPEP  Lab Results  Component Value Date   WBC 11.3 (H) 07/15/2018   NEUTROABS 9.8 (H) 07/15/2018   HGB 10.6 (L) 07/15/2018   HCT 30.8 (L) 07/15/2018   MCV 92.1 07/15/2018   PLT 328 07/15/2018      Chemistry      Component Value Date/Time   NA 138 07/15/2018 0800   K 4.4 07/15/2018 0800   CL 108 07/15/2018 0800   CO2 24 07/15/2018 0800   BUN 33 (H) 07/15/2018 0800   CREATININE 1.40 (H) 07/15/2018 0800      Component Value Date/Time   CALCIUM 9.5 07/15/2018 0800   ALKPHOS 195 (H) 07/15/2018 0800   AST 25 07/15/2018 0800   ALT 24 07/15/2018 0800   BILITOT 0.5 07/15/2018 0800       RADIOGRAPHIC STUDIES: I have personally reviewed the radiological images as listed and agreed with the findings in the report. No results found.   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 STABLE  # Proceed with cycle number 4-day 1 of gemcitabine today.  Labs adequate.  We will plan to get a CT scan after this cycle.   # Reflux- recommend PPI.  Improved.  # CKD- stage III creat-STABLE.   # treatment today; gem/labs-cbc-bmp in 1 week; follow up 3 weeks/labs/gem.  CT scan prior.    Orders Placed This Encounter  Procedures  . CT CHEST WO CONTRAST    Standing Status:   Future    Standing Expiration Date:   07/16/2019    Order Specific Question:   Preferred  imaging location?    Answer:   [facility]    Order Specific Question:   Radiology Contrast Protocol - do NOT remove file path    Answer:   [email or web address]    Order Specific Question:   ** REASON FOR EXAM (FREE TEXT)  Answer:   mesothelioma on chemo   All questions were answered. The patient knows to call the clinic with any problems, questions or concerns.      Cammie Sickle, MD 07/15/2018 9:28 AM

## 2018-07-22 ENCOUNTER — Inpatient Hospital Stay: Payer: Medicare Other

## 2018-07-22 ENCOUNTER — Other Ambulatory Visit: Payer: Self-pay | Admitting: *Deleted

## 2018-07-22 VITALS — BP 156/76 | Temp 97.5°F | Resp 18 | Wt 189.8 lb

## 2018-07-22 DIAGNOSIS — C459 Mesothelioma, unspecified: Secondary | ICD-10-CM

## 2018-07-22 DIAGNOSIS — Z5111 Encounter for antineoplastic chemotherapy: Secondary | ICD-10-CM | POA: Diagnosis not present

## 2018-07-22 LAB — CBC WITH DIFFERENTIAL/PLATELET
BASOS PCT: 1 %
Basophils Absolute: 0.1 10*3/uL (ref 0–0.1)
Eosinophils Absolute: 0.1 10*3/uL (ref 0–0.7)
Eosinophils Relative: 2 %
HCT: 30.7 % — ABNORMAL LOW (ref 40.0–52.0)
Hemoglobin: 10.4 g/dL — ABNORMAL LOW (ref 13.0–18.0)
Lymphocytes Relative: 22 %
Lymphs Abs: 1 10*3/uL (ref 1.0–3.6)
MCH: 31.3 pg (ref 26.0–34.0)
MCHC: 33.9 g/dL (ref 32.0–36.0)
MCV: 92.3 fL (ref 80.0–100.0)
MONOS PCT: 15 %
Monocytes Absolute: 0.7 10*3/uL (ref 0.2–1.0)
Neutro Abs: 2.8 10*3/uL (ref 1.4–6.5)
Neutrophils Relative %: 60 %
PLATELETS: 351 10*3/uL (ref 150–440)
RBC: 3.33 MIL/uL — ABNORMAL LOW (ref 4.40–5.90)
RDW: 19.1 % — ABNORMAL HIGH (ref 11.5–14.5)
WBC: 4.7 10*3/uL (ref 3.8–10.6)

## 2018-07-22 LAB — COMPREHENSIVE METABOLIC PANEL
ALK PHOS: 178 U/L — AB (ref 38–126)
ALT: 38 U/L (ref 0–44)
AST: 29 U/L (ref 15–41)
Albumin: 3.7 g/dL (ref 3.5–5.0)
Anion gap: 10 (ref 5–15)
BUN: 32 mg/dL — ABNORMAL HIGH (ref 8–23)
CALCIUM: 9.6 mg/dL (ref 8.9–10.3)
CO2: 24 mmol/L (ref 22–32)
CREATININE: 1.4 mg/dL — AB (ref 0.61–1.24)
Chloride: 108 mmol/L (ref 98–111)
GFR calc non Af Amer: 48 mL/min — ABNORMAL LOW (ref >60)
GFR, EST AFRICAN AMERICAN: 56 mL/min — AB (ref >60)
Glucose, Bld: 184 mg/dL — ABNORMAL HIGH (ref 70–99)
Potassium: 4.8 mmol/L (ref 3.5–5.1)
SODIUM: 142 mmol/L (ref 135–145)
Total Bilirubin: 0.4 mg/dL (ref 0.3–1.2)
Total Protein: 7.2 g/dL (ref 6.5–8.1)

## 2018-07-22 MED ORDER — DIPHENHYDRAMINE HCL 50 MG/ML IJ SOLN
50.0000 mg | Freq: Once | INTRAMUSCULAR | Status: AC
  Administered 2018-07-22: 50 mg via INTRAVENOUS
  Filled 2018-07-22: qty 1

## 2018-07-22 MED ORDER — SODIUM CHLORIDE 0.9 % IV SOLN
Freq: Once | INTRAVENOUS | Status: AC
  Administered 2018-07-22: 10:00:00 via INTRAVENOUS
  Filled 2018-07-22: qty 250

## 2018-07-22 MED ORDER — SODIUM CHLORIDE 0.9 % IV SOLN
2000.0000 mg | Freq: Once | INTRAVENOUS | Status: AC
  Administered 2018-07-22: 2000 mg via INTRAVENOUS
  Filled 2018-07-22: qty 52.6

## 2018-07-22 MED ORDER — PROCHLORPERAZINE MALEATE 5 MG PO TABS
10.0000 mg | ORAL_TABLET | Freq: Once | ORAL | Status: AC
  Administered 2018-07-22: 10 mg via ORAL
  Filled 2018-07-22: qty 2

## 2018-07-22 MED ORDER — ACETAMINOPHEN 325 MG PO TABS
650.0000 mg | ORAL_TABLET | Freq: Once | ORAL | Status: AC
  Administered 2018-07-22: 650 mg via ORAL
  Filled 2018-07-22: qty 2

## 2018-08-03 ENCOUNTER — Ambulatory Visit
Admission: RE | Admit: 2018-08-03 | Discharge: 2018-08-03 | Disposition: A | Discharge status: Home / Self Care | Payer: Medicare Other | Source: Ambulatory Visit | Attending: Internal Medicine | Admitting: Internal Medicine

## 2018-08-03 DIAGNOSIS — I700 Atherosclerosis of aorta: Secondary | ICD-10-CM | POA: Insufficient documentation

## 2018-08-03 DIAGNOSIS — K7689 Other specified diseases of liver: Secondary | ICD-10-CM | POA: Insufficient documentation

## 2018-08-03 DIAGNOSIS — C7801 Secondary malignant neoplasm of right lung: Secondary | ICD-10-CM | POA: Insufficient documentation

## 2018-08-03 DIAGNOSIS — C459 Mesothelioma, unspecified: Secondary | ICD-10-CM | POA: Diagnosis not present

## 2018-08-03 DIAGNOSIS — I2510 Atherosclerotic heart disease of native coronary artery without angina pectoris: Secondary | ICD-10-CM | POA: Diagnosis not present

## 2018-08-03 DIAGNOSIS — C7802 Secondary malignant neoplasm of left lung: Secondary | ICD-10-CM | POA: Insufficient documentation

## 2018-08-05 ENCOUNTER — Inpatient Hospital Stay: Payer: Medicare Other

## 2018-08-05 ENCOUNTER — Inpatient Hospital Stay: Payer: Medicare Other | Attending: Internal Medicine

## 2018-08-05 ENCOUNTER — Inpatient Hospital Stay (HOSPITAL_BASED_OUTPATIENT_CLINIC_OR_DEPARTMENT_OTHER): Payer: Medicare Other | Admitting: Internal Medicine

## 2018-08-05 ENCOUNTER — Encounter: Payer: Self-pay | Admitting: Internal Medicine

## 2018-08-05 VITALS — BP 160/73 | HR 84 | Temp 97.9°F | Resp 16 | Wt 185.2 lb

## 2018-08-05 DIAGNOSIS — C459 Mesothelioma, unspecified: Secondary | ICD-10-CM | POA: Insufficient documentation

## 2018-08-05 DIAGNOSIS — Z23 Encounter for immunization: Secondary | ICD-10-CM | POA: Diagnosis not present

## 2018-08-05 DIAGNOSIS — N183 Chronic kidney disease, stage 3 (moderate): Secondary | ICD-10-CM | POA: Insufficient documentation

## 2018-08-05 DIAGNOSIS — I129 Hypertensive chronic kidney disease with stage 1 through stage 4 chronic kidney disease, or unspecified chronic kidney disease: Secondary | ICD-10-CM

## 2018-08-05 DIAGNOSIS — M25511 Pain in right shoulder: Secondary | ICD-10-CM | POA: Insufficient documentation

## 2018-08-05 DIAGNOSIS — R509 Fever, unspecified: Secondary | ICD-10-CM | POA: Insufficient documentation

## 2018-08-05 DIAGNOSIS — E1122 Type 2 diabetes mellitus with diabetic chronic kidney disease: Secondary | ICD-10-CM | POA: Diagnosis not present

## 2018-08-05 DIAGNOSIS — R1011 Right upper quadrant pain: Secondary | ICD-10-CM | POA: Diagnosis not present

## 2018-08-05 DIAGNOSIS — Z87891 Personal history of nicotine dependence: Secondary | ICD-10-CM

## 2018-08-05 LAB — COMPREHENSIVE METABOLIC PANEL
ALT: 22 U/L (ref 0–44)
ANION GAP: 10 (ref 5–15)
AST: 28 U/L (ref 15–41)
Albumin: 3.6 g/dL (ref 3.5–5.0)
Alkaline Phosphatase: 164 U/L — ABNORMAL HIGH (ref 38–126)
BILIRUBIN TOTAL: 0.6 mg/dL (ref 0.3–1.2)
BUN: 27 mg/dL — AB (ref 8–23)
CO2: 24 mmol/L (ref 22–32)
Calcium: 9.3 mg/dL (ref 8.9–10.3)
Chloride: 104 mmol/L (ref 98–111)
Creatinine, Ser: 1.33 mg/dL — ABNORMAL HIGH (ref 0.61–1.24)
GFR calc Af Amer: 59 mL/min — ABNORMAL LOW (ref >60)
GFR, EST NON AFRICAN AMERICAN: 51 mL/min — AB (ref >60)
Glucose, Bld: 190 mg/dL — ABNORMAL HIGH (ref 70–99)
POTASSIUM: 4.3 mmol/L (ref 3.5–5.1)
Sodium: 138 mmol/L (ref 135–145)
TOTAL PROTEIN: 7 g/dL (ref 6.5–8.1)

## 2018-08-05 LAB — CBC WITH DIFFERENTIAL/PLATELET
BASOS ABS: 0.1 10*3/uL (ref 0–0.1)
Basophils Relative: 1 %
EOS PCT: 1 %
Eosinophils Absolute: 0.1 10*3/uL (ref 0–0.7)
HCT: 31.7 % — ABNORMAL LOW (ref 40.0–52.0)
HEMOGLOBIN: 11 g/dL — AB (ref 13.0–18.0)
LYMPHS PCT: 8 %
Lymphs Abs: 0.7 10*3/uL — ABNORMAL LOW (ref 1.0–3.6)
MCH: 31.6 pg (ref 26.0–34.0)
MCHC: 34.6 g/dL (ref 32.0–36.0)
MCV: 91.5 fL (ref 80.0–100.0)
Monocytes Absolute: 0.9 10*3/uL (ref 0.2–1.0)
Monocytes Relative: 10 %
NEUTROS ABS: 6.8 10*3/uL — AB (ref 1.4–6.5)
NEUTROS PCT: 80 %
PLATELETS: 317 10*3/uL (ref 150–440)
RBC: 3.47 MIL/uL — ABNORMAL LOW (ref 4.40–5.90)
RDW: 20 % — ABNORMAL HIGH (ref 11.5–14.5)
WBC: 8.6 10*3/uL (ref 3.8–10.6)

## 2018-08-05 NOTE — Progress Notes (Signed)
DISCONTINUE ON PATHWAY REGIMEN - [name]     A cycle is every 21 days:     Gemcitabine   **Always confirm dose/schedule in your pharmacy ordering system**  REASON: Disease Progression PRIOR TREATMENT: [id number]: Gemcitabine 1,000 mg/m2 D1,8 q21 Days x 4 Cycles TREATMENT RESPONSE: Progressive Disease (PD)  START ON PATHWAY REGIMEN - [name]     A cycle is every 21 days:     Pemetrexed   **Always confirm dose/schedule in your pharmacy ordering system**  Patient Characteristics: Relapsed or Progressive Disease, Second Line and Beyond Therapeutic Status: Relapsed or Progressive Disease Intent of Therapy: Non-Curative / Palliative Intent, Discussed with Patient

## 2018-08-05 NOTE — Progress Notes (Signed)
[place] OFFICE PROGRESS NOTE  Patient Care Team: Derinda Late, MD as PCP - General (Family Medicine) Margaretha Sheffield, MD (Otolaryngology) Telford Nab, RN as Registered Nurse Nestor Lewandowsky, MD as Referring Physician (Cardiothoracic Surgery) Cammie Sickle, MD as Consulting Physician (Internal Medicine)  Cancer Staging No matching staging information was found for the patient.   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   # JUNE 2019- GEM; SEP 9th CT scan- Progression  # Hx of CVA Right hand; no residual deficits [left sided carotid block; no surgery done]- on Asa/plavix; CKD- stage III; DM-2; ? PN  # MOLECULAR TESTING- PDL-1- 1% [LOW]; [facility]- No actionable**  ----------------------------------------------------   DIAGNOSIS: [AUG 2019 ] ? [name]   STAGE:  IV  ;GOALS: Palliative  CURRENT/MOST RECENT THERAPY- June 2019- GEM     [name], malignant ([place])   08/11/2018 -  Chemotherapy    The patient had vinorelbine (NAVELBINE) 60 mg in sodium chloride 0.9 % 50 mL chemo infusion, 60 mg (original dose ), Intravenous,  Once, 0 of 4 cycles Dose modification: 30 mg/m2 (Cycle 1, Reason: Provider Judgment), 60 mg (Cycle 1, Reason: Provider  Judgment)  for chemotherapy treatment.        INTERVAL HISTORY:  Ian Shaffer 72 y.o.  male pleasant patient above history of likely mesothelioma currently on gemcitabine chemotherapy status post cycle #4 is here for follow-up/review the results of his restaging CAT scan.  Patient denies any unusual shortness of breath or cough.  Denies any worsening heartburn or bloating.  No fevers or chills.  Appetite is fair.  Review of Systems  Constitutional: Negative for chills, diaphoresis, malaise/fatigue and weight loss.  HENT: Negative for nosebleeds and sore throat.   Eyes: Negative for double vision.  Respiratory: Negative for cough, hemoptysis, sputum production, shortness of breath and wheezing.   Cardiovascular: Negative for chest pain, palpitations, orthopnea and leg swelling.  Gastrointestinal: Positive for heartburn. Negative for abdominal pain, blood in stool, constipation, diarrhea, melena and vomiting.  Genitourinary: Negative for dysuria, frequency and urgency.  Musculoskeletal: Negative for back pain and joint pain.  Skin: Negative.  Negative for itching and rash.  Neurological: Negative for dizziness, tingling, focal weakness, weakness and headaches.  Endo/Heme/Allergies: Does not bruise/bleed easily.  Psychiatric/Behavioral: Negative for depression. The patient is not nervous/anxious and does not have insomnia.       PAST MEDICAL HISTORY :  Past Medical History:  Diagnosis Date  . Benign essential hypertension 05/31/2014  . Cancer ([place]) 06/2017   right lung   . Cerebral infarction ([place]) 05/31/2014  . Cerebrovascular disease 05/31/2014  . CKD (chronic kidney disease) stage 3, GFR 30-59 ml/min (HCC) 02/23/2016  . CVA (cerebral infarction)   . Diabetes mellitus without complication ([place])   . Difficult intubation   . DVT (deep venous thrombosis) ([place])   . Esophageal reflux 05/31/2014  . H/O:  CVA (cerebrovascular accident)   . Hyperlipidemia   . Hypertension   . ICAO  (internal carotid artery occlusion) March 01, 2014   Left  . 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 Pleural effusion 04/23/2017  . Stroke [facility]) April, 4,2015  . Type 2 diabetes mellitus with peripheral neuropathy ([place]) 02/23/2016  . Weakness     PAST SURGICAL HISTORY :   Past Surgical History:  Procedure Laterality Date  . CHEST TUBE INSERTION N/A 06/25/2017   Procedure: ACZYSA CATHETER INSERTION;  Surgeon: Nestor Lewandowsky, MD;  Location: [facility] ORS;  Service: Thoracic;  Laterality: N/A;  . CHEST TUBE INSERTION Right 08/07/2017   Procedure: REMOVAL OF PLEUR X CATHETER;  Surgeon: Nestor Lewandowsky, MD;  Location: [facility] ORS;  Service: General;  Laterality: Right;  . NASAL SINUS SURGERY  2000  . TONSILLECTOMY    . VIDEO ASSISTED THORACOSCOPY Right 06/25/2017   Procedure: VIDEO ASSISTED THORACOSCOPY WITH TALC PLEURODESIS, CHEST TUBE INSERTION;  Surgeon: Nestor Lewandowsky, MD;  Location: [facility] ORS;  Service: Thoracic;  Laterality: Right;    FAMILY HISTORY :   Family History  Problem Relation Age of Onset  . Hypertension Mother   . Hypertension Father     SOCIAL HISTORY:   Social History   Tobacco Use  . Smoking status: Former Smoker    Types: Pipe    Last attempt to quit: 05/07/1974    Years since quitting: 44.2  . Smokeless tobacco: Never Used  . Tobacco comment: pt states he smoked a pipe a long time ago  Substance Use Topics  . Alcohol use: No  . Drug use: No    ALLERGIES:  is allergic to sulfa antibiotics; adhesive [tape]; amoxicillin; and other.  MEDICATIONS:  Current Outpatient Medications  Medication Sig Dispense Refill  . acetaminophen (TYLENOL) 325 MG tablet Take 2 tablets (650 mg total) by mouth every 6 (six) hours as needed for mild pain (or Fever >/= 101).    Marland Kitchen amLODipine (NORVASC) 5 MG tablet Take 1 tablet by mouth daily.    Marland Kitchen aspirin EC 81 MG tablet Take 81 mg by mouth daily.    . clopidogrel (PLAVIX) 75 MG tablet Take 75 mg by mouth daily.    .  fluticasone (FLONASE) 50 MCG/ACT nasal spray Place 1 spray into both nostrils daily at 2 PM.     . lansoprazole (PREVACID) 30 MG capsule TAKE 1 CAPSULE EVERY DAY BEFORE BREAKFAST 30 capsule 4  . liraglutide (VICTOZA) 18 MG/3ML SOPN Inject 1.2 mg into the skin daily.    . metoprolol succinate (TOPROL-XL) 25 MG 24 hr tablet Take 25 mg by mouth daily.    . montelukast (SINGULAIR) 10 MG tablet Take 1 tablet (10 mg total) by mouth at bedtime. Take 1 tablet-10 mg night prior x 3 days. 40 tablet 3  . Multiple Vitamins-Minerals (CENTRUM SILVER PO) Take 1 tablet by mouth daily.    . ondansetron (ZOFRAN) 8 MG tablet Take 8 mg by mouth every 8 (eight) hours as needed for nausea or refractory nausea / vomiting.     . polyethylene glycol powder (GLYCOLAX/MIRALAX) powder Take 17 g by mouth daily as needed (for constipation.).    Marland Kitchen pravastatin (PRAVACHOL) 40 MG tablet Take 40 mg by mouth daily.    . prochlorperazine (COMPAZINE) 10 MG tablet Take 10 mg by mouth every 8 (eight) hours as needed for vomiting.     . triamcinolone cream (KENALOG) 0.1 % Apply topically 2 (two) times daily. 30 g 0  No current facility-administered medications for this visit.     PHYSICAL EXAMINATION: ECOG PERFORMANCE STATUS: 0 - Asymptomatic  BP (!) 160/73 (BP Location: Left Arm, Patient Position: Sitting)   Pulse 84   Temp 97.9 F (36.6 C) (Tympanic)   Resp 16   Wt 185 lb 3.2 oz (84 kg)   BMI 26.57 kg/m   [employer]   08/05/18 0839  Weight: 185 lb 3.2 oz (84 kg)    Physical Exam  Constitutional: He is oriented to person, place, and time and well-developed, well-nourished, and in no distress.  Accompanied by his wife.  Is walking by himself.  HENT:  Head: Normocephalic and atraumatic.  Mouth/Throat: Oropharynx is clear and moist. No oropharyngeal exudate.  Eyes: Pupils are equal, round, and reactive to light.  Neck: Normal range of motion. Neck supple.  Cardiovascular: Normal rate and regular rhythm.   Pulmonary/Chest: No respiratory distress. He has no wheezes.  Abdominal: Soft. Bowel sounds are normal. He exhibits no distension and no mass. There is no tenderness. There is no rebound and no guarding.  Musculoskeletal: Normal range of motion. He exhibits no edema or tenderness.  Neurological: He is alert and oriented to person, place, and time.  Skin: Skin is warm.  Psychiatric: Affect normal.       LABORATORY DATA:  I have reviewed the data as listed    Component Value Date/Time   NA 138 08/05/2018 0819   K 4.3 08/05/2018 0819   CL 104 08/05/2018 0819   CO2 24 08/05/2018 0819   GLUCOSE 190 (H) 08/05/2018 0819   BUN 27 (H) 08/05/2018 0819   CREATININE 1.33 (H) 08/05/2018 0819   CALCIUM 9.3 08/05/2018 0819   PROT 7.0 08/05/2018 0819   ALBUMIN 3.6 08/05/2018 0819   AST 28 08/05/2018 0819   ALT 22 08/05/2018 0819   ALKPHOS 164 (H) 08/05/2018 0819   BILITOT 0.6 08/05/2018 0819   GFRNONAA 51 (L) 08/05/2018 0819   GFRAA 59 (L) 08/05/2018 0819    No results found for: SPEP, UPEP  Lab Results  Component Value Date   WBC 8.6 08/05/2018   NEUTROABS 6.8 (H) 08/05/2018   HGB 11.0 (L) 08/05/2018   HCT 31.7 (L) 08/05/2018   MCV 91.5 08/05/2018   PLT 317 08/05/2018      Chemistry      Component Value Date/Time   NA 138 08/05/2018 0819   K 4.3 08/05/2018 0819   CL 104 08/05/2018 0819   CO2 24 08/05/2018 0819   BUN 27 (H) 08/05/2018 0819   CREATININE 1.33 (H) 08/05/2018 0819      Component Value Date/Time   CALCIUM 9.3 08/05/2018 0819   ALKPHOS 164 (H) 08/05/2018 0819   AST 28 08/05/2018 0819   ALT 22 08/05/2018 0819   BILITOT 0.6 08/05/2018 0819       RADIOGRAPHIC STUDIES: I have personally reviewed the radiological images as listed and agreed with the findings in the report. No results found.   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   # HOLD gemcitabine chemotherapy.  Discussed unfortunately very limited options.  In terms of conventional chemotherapy I would recommend a trial of vinorelbine 30 mg/m q. Weekly.   Discussed the potential side effects including but not limited to tingling and numbness cytopenias fatigue.  Understands treatments are  palliative; not curative.  #I have also reached out to Dr. Durenda Hurt at [facility] regarding any clinical trial options.  Patient not keen on traveling; will check regarding options at [facility] in [place].   # Reflux- recommend PPI.  Improved.  # CKD- stage III creat-STABLE.   # HOLD chemo today; 1 week/vinorelbine; follow up MD/labs/Vino in 2 weeks.   # I reviewed the blood work- with the patient in detail; also reviewed the imaging independently [as summarized above]; and with the patient in detail.   # 40 minutes face-to-face with the patient discussing the above plan of care; more than 50% of time spent on prognosis/ natural history; counseling and coordination.     Orders Placed This Encounter  Procedures  . CBC with Differential/Platelet    Standing Status:   Future    Standing Expiration Date:   08/06/2019  . Comprehensive metabolic panel    Standing Status:   Future    Standing Expiration Date:   08/06/2019   All questions were answered. The patient knows to call the clinic with any problems, questions or concerns.      Cammie Sickle, MD 08/05/2018 5:24 PM

## 2018-08-05 NOTE — Assessment & Plan Note (Addendum)
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   # HOLD gemcitabine chemotherapy.  Discussed unfortunately very limited options.  In terms of conventional chemotherapy I would recommend a trial of vinorelbine 30 mg/m q. Weekly.   Discussed the potential side effects including but not limited to tingling and numbness cytopenias fatigue.  Understands treatments are palliative; not curative.  #I have also reached out to Dr. Durenda Hurt at [facility] regarding any clinical trial options.  Patient not keen on traveling; will check regarding options at [facility] in [place].   # Reflux- recommend PPI.  Improved.  # CKD- stage III creat-STABLE.   # HOLD chemo today; 1 week/vinorelbine; follow up MD/labs/Vino in 2 weeks.   # I reviewed the blood work- with the patient in detail; also reviewed the imaging independently [as summarized above]; and with the patient in detail.   # 40 minutes face-to-face with the patient discussing the above plan of care; more than 50% of time spent on prognosis/ natural history; counseling and coordination.

## 2018-08-10 ENCOUNTER — Telehealth: Payer: Self-pay | Admitting: *Deleted

## 2018-08-10 DIAGNOSIS — R071 Chest pain on breathing: Secondary | ICD-10-CM

## 2018-08-10 DIAGNOSIS — C459 Mesothelioma, unspecified: Secondary | ICD-10-CM

## 2018-08-10 DIAGNOSIS — R509 Fever, unspecified: Secondary | ICD-10-CM

## 2018-08-10 NOTE — Telephone Encounter (Signed)
Patient called requested at 1600. Call returned at 1622  "my right side hurting all day when I change positions and occasionally with inspiration. I have been sleeping all day. I was also feeling flushed. I just checked my temp. 101.5. I also have a sore throat all day." Has not taken any Tylenol. No signs of dysuria. He is scheduled to start Vinorelbine Wednesday. He would like to get these symptoms resolved before his new start chemotherapy. Dr. B already left for the day. I spoke with Ander Purpura, NP. She would like to order a chest xray to r/o infection and cbc, metc, and ua. Pt given an apt for 1045 am for labs and 11 am with NP. Pt has an apt with orthopedic md-Dr. Sabra Heck in am at 915 and preferred a mid-morning apt. He will try to obtain the cxr prior to the apt with ortho, so that results are available.

## 2018-08-11 ENCOUNTER — Inpatient Hospital Stay (HOSPITAL_BASED_OUTPATIENT_CLINIC_OR_DEPARTMENT_OTHER): Payer: Medicare Other | Admitting: Nurse Practitioner

## 2018-08-11 ENCOUNTER — Encounter: Payer: Self-pay | Admitting: Nurse Practitioner

## 2018-08-11 ENCOUNTER — Ambulatory Visit
Admission: RE | Admit: 2018-08-11 | Discharge: 2018-08-11 | Disposition: A | Discharge status: Home / Self Care | Payer: Medicare Other | Source: Ambulatory Visit | Attending: Nurse Practitioner | Admitting: Nurse Practitioner

## 2018-08-11 ENCOUNTER — Inpatient Hospital Stay: Payer: Medicare Other

## 2018-08-11 VITALS — BP 120/75 | HR 85 | Temp 100.5°F | Resp 16 | Wt 184.0 lb

## 2018-08-11 DIAGNOSIS — C459 Mesothelioma, unspecified: Secondary | ICD-10-CM

## 2018-08-11 DIAGNOSIS — R509 Fever, unspecified: Secondary | ICD-10-CM

## 2018-08-11 DIAGNOSIS — C7800 Secondary malignant neoplasm of unspecified lung: Secondary | ICD-10-CM | POA: Diagnosis not present

## 2018-08-11 DIAGNOSIS — R071 Chest pain on breathing: Secondary | ICD-10-CM | POA: Diagnosis not present

## 2018-08-11 DIAGNOSIS — R1011 Right upper quadrant pain: Secondary | ICD-10-CM | POA: Diagnosis present

## 2018-08-11 DIAGNOSIS — C787 Secondary malignant neoplasm of liver and intrahepatic bile duct: Secondary | ICD-10-CM | POA: Insufficient documentation

## 2018-08-11 LAB — COMPREHENSIVE METABOLIC PANEL
ALBUMIN: 3.5 g/dL (ref 3.5–5.0)
ALK PHOS: 195 U/L — AB (ref 38–126)
ALT: 22 U/L (ref 0–44)
AST: 30 U/L (ref 15–41)
Anion gap: 12 (ref 5–15)
BILIRUBIN TOTAL: 0.6 mg/dL (ref 0.3–1.2)
BUN: 24 mg/dL — AB (ref 8–23)
CALCIUM: 9.2 mg/dL (ref 8.9–10.3)
CO2: 23 mmol/L (ref 22–32)
CREATININE: 1.62 mg/dL — AB (ref 0.61–1.24)
Chloride: 104 mmol/L (ref 98–111)
GFR calc Af Amer: 47 mL/min — ABNORMAL LOW (ref >60)
GFR calc non Af Amer: 40 mL/min — ABNORMAL LOW (ref >60)
GLUCOSE: 155 mg/dL — AB (ref 70–99)
Potassium: 4.3 mmol/L (ref 3.5–5.1)
Sodium: 139 mmol/L (ref 135–145)
TOTAL PROTEIN: 7 g/dL (ref 6.5–8.1)

## 2018-08-11 LAB — CBC WITH DIFFERENTIAL/PLATELET
BASOS ABS: 0.1 10*3/uL (ref 0–0.1)
BASOS PCT: 1 %
EOS PCT: 1 %
Eosinophils Absolute: 0.1 10*3/uL (ref 0–0.7)
HCT: 32 % — ABNORMAL LOW (ref 40.0–52.0)
Hemoglobin: 10.7 g/dL — ABNORMAL LOW (ref 13.0–18.0)
Lymphocytes Relative: 7 %
Lymphs Abs: 0.9 10*3/uL — ABNORMAL LOW (ref 1.0–3.6)
MCH: 30.3 pg (ref 26.0–34.0)
MCHC: 33.3 g/dL (ref 32.0–36.0)
MCV: 91 fL (ref 80.0–100.0)
MONO ABS: 2 10*3/uL — AB (ref 0.2–1.0)
MONOS PCT: 16 %
Neutro Abs: 9.7 10*3/uL — ABNORMAL HIGH (ref 1.4–6.5)
Neutrophils Relative %: 75 %
PLATELETS: 386 10*3/uL (ref 150–440)
RBC: 3.52 MIL/uL — ABNORMAL LOW (ref 4.40–5.90)
RDW: 18.6 % — AB (ref 11.5–14.5)
WBC: 12.8 10*3/uL — ABNORMAL HIGH (ref 3.8–10.6)

## 2018-08-11 LAB — URINALYSIS, COMPLETE (UACMP) WITH MICROSCOPIC
BILIRUBIN URINE: NEGATIVE
Glucose, UA: NEGATIVE mg/dL
Hgb urine dipstick: NEGATIVE
Ketones, ur: 5 mg/dL — AB
LEUKOCYTES UA: NEGATIVE
NITRITE: NEGATIVE
PH: 5 (ref 5.0–8.0)
Protein, ur: 100 mg/dL — AB
SPECIFIC GRAVITY, URINE: 1.034 — AB (ref 1.005–1.030)

## 2018-08-11 MED ORDER — LEVOFLOXACIN 750 MG PO TABS: 750.0000 mg | ORAL_TABLET | ORAL | 0 refills | Status: DC

## 2018-08-11 MED ORDER — OXYCODONE HCL 5 MG PO TABS: 5.0000 mg | ORAL_TABLET | ORAL | 0 refills | Status: DC | PRN

## 2018-08-11 NOTE — Progress Notes (Signed)
Symptom Management [place]  Telephone:([phone]) [phone] [phone]) [phone]  Patient Care Team: Derinda Late, MD as PCP - General (Family Medicine) Margaretha Sheffield, MD (Otolaryngology) Telford Nab, RN as Registered Nurse Nestor Lewandowsky, MD as Referring Physician (Cardiothoracic Surgery) Cammie Sickle, MD as Consulting Physician (Internal Medicine)   Name of the patient: Ian Shaffer  [id number]  [birth year]   Date of visit: 08/11/18  Diagnosis- Malignant [name]  Chief complaint/ Reason for visit- Fever & RUQ abdominal pain  Heme/Onc history:  Oncology History   # July-AUG 2018- right pleural based mass ~10 cm [ above & below diaphragm]; s/p VATS- Dr.[facility]aks-Bx- 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; status post infliximab infusions x2; most recent June 3rd 2019.   # JUNE 2019- GEM; SEP 9th CT scan- Progression  # Hx of CVA Right hand; no residual deficits [left sided carotid block; no surgery done]- on Asa/plavix; CKD- stage III; DM-2; ? PN  # MOLECULAR TESTING- PDL-1- 1% [LOW]; [facility]- No actionable**  ----------------------------------------------------   DIAGNOSIS: [AUG 2019 ] ? [name]   STAGE:  IV  ;GOALS: Palliative  CURRENT/MOST RECENT THERAPY- June 2019- GEM     [name], malignant ([place])   08/11/2018 -  Chemotherapy    The patient had vinorelbine (NAVELBINE) 60 mg in sodium chloride  0.9 % 50 mL chemo infusion, 60 mg (original dose ), Intravenous,  Once, 0 of 4 cycles Dose modification: 30 mg/m2 (Cycle 1, Reason: Provider Judgment), 60 mg (Cycle 1, Reason: Provider Judgment)  for chemotherapy treatment.      Interval history- Ian Shaffer, 72 year old male, presents with fevers up to 101.5 degrees. He has had the fever for 1 day.  Symptoms have been unchanged.  He continued to have fever this morning and took Tylenol.  Associated symptoms: Sore throat, worsening right upper quadrant pain (rates 6/10 in intensity.  Describes as sharp to dull ache; worse with movement and worse with inspiration), flushed, chills.  He is unsure if abdominal pain is better or worse with food.  Is unsure if pain is better or worse with bowel movements.  Pain interferes with sleep.  He had diarrhea last week but symptoms resolve spontaneously.  Last BM 2 days ago.   Appetite has been stable.  Urine output has been normal.  Denies sick contacts.   He denies any chest pain, nausea, vomiting, constipation.  He is scheduled to start Vinorelbine tomorrow; previously on gemcitabine but recent imaging showed progression and known disease and liver. He has a history of seasonal allergies but denies sneezing, coughing, or increased sputum production.  No recent blood transfusions.  Last transfusion of gemcitabine on 07/22/2018.  Per patient, did not experience fever with gemcitabine.  ECOG FS:1 - Symptomatic but completely ambulatory  Review of systems- Review of Systems  Constitutional: Positive for chills, diaphoresis (not currently but overnight), fever and malaise/fatigue. Negative for weight loss.  HENT: Positive for sore throat. Negative for congestion, ear discharge, ear pain, sinus pain and tinnitus.   Eyes: Negative.   Respiratory: Positive  for cough (Chronic, unchanged). Negative for hemoptysis, sputum production and shortness of breath.   Cardiovascular: Negative for chest pain, palpitations,  orthopnea, claudication and leg swelling.  Gastrointestinal: Positive for abdominal pain (per hpi) and diarrhea (per hpi). Negative for blood in stool, constipation, heartburn, nausea and vomiting.  Genitourinary: Negative.   Musculoskeletal: Negative.   Skin: Negative.   Neurological: Negative for dizziness, tingling, weakness and headaches.  Endo/Heme/Allergies: Positive for environmental allergies.  Psychiatric/Behavioral: Negative.      Current treatment- Vinorelbine (start on 08/12/18); previously on gemcitabine (last 07/22/18)  Allergies  Allergen Reactions  . Sulfa Antibiotics Swelling    Facial swelling No tongue or lips swelling, no difficulty breathing.  . Adhesive [Tape] Itching and Rash  . Amoxicillin Nausea Only and Other (See Comments)    Has patient had a PCN reaction causing immediate rash, facial/tongue/throat swelling, SOB or lightheadedness with hypotension: No Has patient had a PCN reaction causing severe rash involving mucus membranes or skin necrosis: No Has patient had a PCN reaction that required hospitalization: No Has patient had a PCN reaction occurring within the last 10 years: Yes If all of the above answers are "NO", then may proceed with Cephalosporin use.   . Other Rash    Surgical tape    Past Medical History:  Diagnosis Date  . Benign essential hypertension 05/31/2014  . Cancer ([place]) 06/2017   right lung   . Cerebral infarction ([place]) 05/31/2014  . Cerebrovascular disease 05/31/2014  . CKD (chronic kidney disease) stage 3, GFR 30-59 ml/min (HCC) 02/23/2016  . CVA (cerebral infarction)   . Diabetes mellitus without complication ([place])   . Difficult intubation   . DVT (deep venous thrombosis) ([place])   . Esophageal reflux 05/31/2014  . H/O: CVA (cerebrovascular accident)   . Hyperlipidemia   . Hypertension   . ICAO (internal carotid artery occlusion) March 01, 2014   Left  . 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 Pleural  effusion 04/23/2017  . Stroke [facility]) April, 4,2015  . Type 2 diabetes mellitus with peripheral neuropathy ([place]) 02/23/2016  . Weakness     Past Surgical History:  Procedure Laterality Date  . CHEST TUBE INSERTION N/A 06/25/2017   Procedure: GURKYH CATHETER INSERTION;  Surgeon: Nestor Lewandowsky, MD;  Location: [facility] ORS;  Service: Thoracic;  Laterality: N/A;  . CHEST TUBE INSERTION Right 08/07/2017   Procedure: REMOVAL OF PLEUR X CATHETER;  Surgeon: Nestor Lewandowsky, MD;  Location: [facility] ORS;  Service: General;  Laterality: Right;  . NASAL SINUS SURGERY  2000  . TONSILLECTOMY    . VIDEO ASSISTED THORACOSCOPY Right 06/25/2017   Procedure: VIDEO ASSISTED THORACOSCOPY WITH TALC PLEURODESIS, CHEST TUBE INSERTION;  Surgeon: Nestor Lewandowsky, MD;  Location: [facility] ORS;  Service: Thoracic;  Laterality: Right;    Social History   Socioeconomic History  . Marital status: Married    Spouse name: Vaughan Basta  . Number of children: Not on file  . Years of education: Not on file  . Highest education level: Not on file  Occupational History  . Not on file  Social Needs  . Financial resource strain: Not hard at all  . Food insecurity:    Worry: Patient refused    Inability: Patient refused  . Transportation needs:    Medical: Patient refused    Non-medical: Patient refused  Tobacco Use  . Smoking status: Former Smoker    Types: Pipe    Last attempt to quit: 05/07/1974    Years since quitting:  44.2  . Smokeless tobacco: Never Used  . Tobacco comment: pt states he smoked a pipe a long time ago  Substance and Sexual Activity  . Alcohol use: No  . Drug use: No  . Sexual activity: Not Currently  Lifestyle  . Physical activity:    Days per week: Patient refused    Minutes per session: Patient refused  . Stress: Only a little  Relationships  . Social connections:    Talks on phone: Patient refused    Gets together: Patient refused    Attends religious service: Patient refused    Active member of club or  organization: Patient refused    Attends meetings of clubs or organizations: Patient refused    Relationship status: Patient refused  . Intimate partner violence:    Fear of current or ex partner: Patient refused    Emotionally abused: Patient refused    Physically abused: Patient refused    Forced sexual activity: Patient refused  Other Topics Concern  . Not on file  Social History Narrative  . Not on file    Family History  Problem Relation Age of Onset  . Hypertension Mother   . Hypertension Father      Current Outpatient Medications:  .  acetaminophen (TYLENOL) 325 MG tablet, Take 2 tablets (650 mg total) by mouth every 6 (six) hours as needed for mild pain (or Fever >/= 101)., Disp: , Rfl:  .  amLODipine (NORVASC) 5 MG tablet, Take 1 tablet by mouth daily., Disp: , Rfl:  .  aspirin EC 81 MG tablet, Take 81 mg by mouth daily., Disp: , Rfl:  .  clopidogrel (PLAVIX) 75 MG tablet, Take 75 mg by mouth daily., Disp: , Rfl:  .  fluticasone (FLONASE) 50 MCG/ACT nasal spray, Place 1 spray into both nostrils daily at 2 PM. , Disp: , Rfl:  .  lansoprazole (PREVACID) 30 MG capsule, TAKE 1 CAPSULE EVERY DAY BEFORE BREAKFAST, Disp: 30 capsule, Rfl: 4 .  liraglutide (VICTOZA) 18 MG/3ML SOPN, Inject 1.2 mg into the skin daily., Disp: , Rfl:  .  metoprolol succinate (TOPROL-XL) 25 MG 24 hr tablet, Take 25 mg by mouth daily., Disp: , Rfl:  .  Multiple Vitamins-Minerals (CENTRUM SILVER PO), Take 1 tablet by mouth daily., Disp: , Rfl:  .  ondansetron (ZOFRAN) 8 MG tablet, Take 8 mg by mouth every 8 (eight) hours as needed for nausea or refractory nausea / vomiting. , Disp: , Rfl:  .  pravastatin (PRAVACHOL) 40 MG tablet, Take 40 mg by mouth daily., Disp: , Rfl:  .  prochlorperazine (COMPAZINE) 10 MG tablet, Take 10 mg by mouth every 8 (eight) hours as needed for vomiting. , Disp: , Rfl:  .  triamcinolone cream (KENALOG) 0.1 %, Apply topically 2 (two) times daily., Disp: 30 g, Rfl: 0 .   montelukast (SINGULAIR) 10 MG tablet, Take 1 tablet (10 mg total) by mouth at bedtime. Take 1 tablet-10 mg night prior x 3 days. (Patient not taking: Reported on 08/11/2018), Disp: 40 tablet, Rfl: 3 .  oxyCODONE (OXY IR/ROXICODONE) 5 MG immediate release tablet, Take 1 tablet (5 mg total) by mouth every 4 (four) hours as needed for severe pain., Disp: 30 tablet, Rfl: 0 .  polyethylene glycol powder (GLYCOLAX/MIRALAX) powder, Take 17 g by mouth daily as needed (for constipation.)., Disp: , Rfl:   Physical exam:  Vitals:   08/11/18 1026  BP: 120/75  Pulse: 85  Resp: 16  Temp: (!) 100.5 F (38.1 C)  TempSrc: Tympanic  SpO2: 98%  Weight: 184 lb (83.5 kg)   Physical Exam  Constitutional: He is oriented to person, place, and time. He appears well-developed and well-nourished. No distress.  accompanied  HENT:  Head: Normocephalic and atraumatic.  Mouth/Throat: Oropharynx is clear and moist. No oropharyngeal exudate.  Eyes: Pupils are equal, round, and reactive to light. EOM are normal. No scleral icterus.  Cardiovascular: Normal rate and regular rhythm.  Pulmonary/Chest: Effort normal. No respiratory distress. He has decreased breath sounds in the right middle field and the right lower field. He has no wheezes. He has no rhonchi. He has no rales.  Abdominal: Soft. Normal appearance and bowel sounds are normal. He exhibits no distension and no ascites. There is tenderness (grimacing RUQ TTP) in the right upper quadrant. There is no rebound and negative Murphy's sign.  Neurological: He is alert and oriented to person, place, and time.  Skin: Skin is warm and dry.  Psychiatric: He has a normal mood and affect. His behavior is normal.     CMP Latest Ref Rng & Units 08/11/2018  Glucose 70 - 99 mg/dL 155(H)  BUN 8 - 23 mg/dL 24(H)  Creatinine 0.61 - 1.24 mg/dL 1.62(H)  Sodium 135 - 145 mmol/L 139  Potassium 3.5 - 5.1 mmol/L 4.3  Chloride 98 - 111 mmol/L 104  CO2 22 - 32 mmol/L 23  Calcium 8.9  - 10.3 mg/dL 9.2  Total Protein 6.5 - 8.1 g/dL 7.0  Total Bilirubin 0.3 - 1.2 mg/dL 0.6  Alkaline Phos 38 - 126 U/L 195(H)  AST 15 - 41 U/L 30  ALT 0 - 44 U/L 22   CBC Latest Ref Rng & Units 08/11/2018  WBC 3.8 - 10.6 K/uL 12.8(H)  Hemoglobin 13.0 - 18.0 g/dL 10.7(L)  Hematocrit 40.0 - 52.0 % 32.0(L)  Platelets 150 - 440 K/uL 386    No images are attached to the encounter.  Dg Chest 2 View  Result Date: 08/11/2018 CLINICAL DATA:  History of mesothelioma, chest pain and fever EXAM: CHEST - 2 VIEW COMPARISON:  CT chest of 08/03/2018 and chest x-ray of 05/20/2017 FINDINGS: Multiple lung nodules again are noted throughout the lungs consistent with diffuse metastatic involvement of the lungs. The largest nodule at the left lung base currently measures 36 mm in diameter compared to prior measurement of approximately 30 mm on recent CT. This may be some discrepancy in matter measurement, but the lesions have certainly not diminished in size. A tiny right pleural effusion cannot be excluded. Mediastinal and hilar contours are unchanged and heart size is stable. No bony abnormality is noted other than degenerative change throughout the thoracic spine. IMPRESSION: Diffuse lung metastases again are noted possibly slightly larger. No pneumonia is seen. A tiny right pleural effusion cannot be excluded Electronically Signed   By: Ivar Drape M.D.   On: 08/11/2018 10:00   Ct Chest Wo Contrast  Result Date: 08/03/2018 CLINICAL DATA:  History of mesothelioma diagnosed in August of 2018. Follow-up imaging. EXAM: CT CHEST WITHOUT CONTRAST TECHNIQUE: Multidetector CT imaging of the chest was performed following the standard protocol without IV contrast. COMPARISON:  05/06/2018 FINDINGS: Cardiovascular: The heart size appears within normal limits. No pericardial effusion. Aortic atherosclerosis. Calcification in the RCA, LAD and left circumflex coronary arteries. Mediastinum/Nodes: Normal appearance of the thyroid  gland. The trachea appears patent and is midline. Normal appearance of the esophagus. No axillary or supraclavicular adenopathy. No mediastinal adenopathy identified. Lungs/Pleura: No pleural effusion. Again seen is pleural  thickening overlying the posterior right lower lobe. The pleural base lung mass within the posterior right lower lobe measures 4.3 by 3.0 cm, image 117/3. Previously 4.9 x 2.7 cm. Multiple pulmonary parenchymal nodules are identified in both lungs compatible with metastatic disease. The index lesion within the right middle lobe measures 3.4 cm, image 100/4. Previously 1.0 cm. Index lesion within the left lower lobe measures 2.9 cm, image 89/4. Previously 1.5 cm. New lesion within the left upper lobe measures 1.2 cm, image 55/4. Anterior right upper lobe nodule measures 0.9 cm, image number 68/4. Previously 0.7 cm. Upper Abdomen: The large mass involving the posterior right lobe of liver measures 12.6 x 10.3 cm, image 24/3. Previously 11.0 x 7.9 cm. Soft tissue nodularity along the surface of the posterior liver has progressed. Dominant lesion measures 5.2 cm versus 4.9 cm previously. Adjacent nodule measures 2.2 cm, image 149/3. Previously 1.1 cm. Musculoskeletal: No chest wall mass or suspicious bone lesions identified. IMPRESSION: 1. Overall interval progression of disease. Although the primary lesion within the posterior right hemithorax is not significantly changed, bilateral pulmonary metastasis have increased in size and number when compared with previous exam. The mass involving the posterior right lobe of liver has also increased in size in the interval. Two subcapsular lesions along the posterior aspect of the right lobe of liver have increased in the interval. 2.  Aortic Atherosclerosis (ICD10-I70.0). 3. Multi vessel coronary artery atherosclerotic calcifications. Electronically Signed   By: Kerby Moors M.D.   On: 08/03/2018 14:15    Assessment and plan- Patient is a 72 y.o. male  diagnosed with metastatic mesothelioma who presents to symptom management clinic for fever.  1.  Metastatic malignant mesothelioma-CT imaging from 07/04/2018 showed progression of bilateral lung nodules and new lung nodules, infra diaphragmatic/posterior liver mass worsening.  Intrathoracic lesions stable.  Previously on gemcitabine. Plan to start Vinorelbine 30 mg/m2 weekly on 08/12/18. Discussed acute fever with Dr. Rogue Bussing who has discussed patient with [facility] Aniceto Boss at [facility], patient may be candidate for trial. Dr. Jacinto Reap, recommends holding venorelbine and starting antibiotics (see below) given potential trial.    2.  Fever- etiology unclear; Chest x-ray reviewed; no evidence of pneumonia; progressive disease. UA negative for infection, kidney function slightly worse. WBC 12.8, ANC 9.7. Discussed with Dr. Rogue Bussing who recommends levaquin 735m PO renally dosed q48h x 1 week. Encouraged increased fluid intake in setting of fever. Can continue Tylenol for fever, not to exceed 3g/day.   3.  Right upper quadrant abdominal pain-acutely worse right upper quadrant abdominal pain.  Prior CT on 08/03/2018 revealed progression in the liver.  In the setting of fever will get ultrasound to further evaluate.  Ultrasound revealed: no biliary duct dilatation, normal gallbladder, widespread metastatic disease throughout right lobe of liver and somewhat infiltrative in appearance.  No evidence of gallstones.  Suspect pain related to malignancy. Start oxycodone 524mPO q4h prn. Risk vs benefits discussed.   Hold tx tomorrow. Follow up with Dr. StDurenda Hurtegarding possible clinical trial. Follow up with Dr. BrRogue Bussingext week. Patient advised to notify the clinic if there is no improvement in symptoms or if symptoms worsen in next 3-4 days.    Visit Diagnosis 1. [name]  2. Abdominal pain, right upper quadrant   3. Fever, unspecified fever cause     Patient expressed understanding and was in  agreement with this plan. He also understands that He can call clinic at any time with any questions, concerns, or complaints.  Thank you for allowing me to participate in the care of this very pleasant patient.   Beckey Rutter, DNP, AGNP-C [place] at [facility] [phone] (work cell) [phone] (office)

## 2018-08-12 ENCOUNTER — Inpatient Hospital Stay: Payer: Medicare Other

## 2018-08-12 LAB — URINE CULTURE: Culture: NO GROWTH

## 2018-08-17 ENCOUNTER — Encounter: Payer: Self-pay | Admitting: Internal Medicine

## 2018-08-17 ENCOUNTER — Telehealth: Payer: Self-pay | Admitting: *Deleted

## 2018-08-17 ENCOUNTER — Inpatient Hospital Stay (HOSPITAL_BASED_OUTPATIENT_CLINIC_OR_DEPARTMENT_OTHER): Payer: Medicare Other | Admitting: Internal Medicine

## 2018-08-17 ENCOUNTER — Inpatient Hospital Stay: Payer: Medicare Other

## 2018-08-17 VITALS — BP 143/75 | HR 87 | Temp 98.6°F | Resp 16 | Wt 184.6 lb

## 2018-08-17 DIAGNOSIS — M25511 Pain in right shoulder: Secondary | ICD-10-CM

## 2018-08-17 DIAGNOSIS — E1122 Type 2 diabetes mellitus with diabetic chronic kidney disease: Secondary | ICD-10-CM

## 2018-08-17 DIAGNOSIS — C459 Mesothelioma, unspecified: Secondary | ICD-10-CM | POA: Diagnosis not present

## 2018-08-17 DIAGNOSIS — N183 Chronic kidney disease, stage 3 (moderate): Secondary | ICD-10-CM

## 2018-08-17 DIAGNOSIS — Z87891 Personal history of nicotine dependence: Secondary | ICD-10-CM

## 2018-08-17 DIAGNOSIS — I129 Hypertensive chronic kidney disease with stage 1 through stage 4 chronic kidney disease, or unspecified chronic kidney disease: Secondary | ICD-10-CM

## 2018-08-17 DIAGNOSIS — Z23 Encounter for immunization: Secondary | ICD-10-CM

## 2018-08-17 MED ORDER — INFLUENZA VAC SPLIT HIGH-DOSE 0.5 ML IM SUSY
0.5000 mL | PREFILLED_SYRINGE | Freq: Once | INTRAMUSCULAR | Status: AC
  Administered 2018-08-17: 0.5 mL via INTRAMUSCULAR
  Filled 2018-08-17: qty 0.5

## 2018-08-17 NOTE — Telephone Encounter (Signed)
Patient contacted. Patient states that he is feeling much better from last week; however, he still has 'generalized weakness.' He is having "second thoughts about this new chemotherapy. I am still not up to par right now. I also have a tremendous amount on my plate. I have to go back to see Dr. Sabra Heck this week as well for a f/u on MRI results of my shoulder. This is apt is also on Wednesday at 10."  Per md, he still wants to see the patient in the office. Patient offered an apt this afternoon at 1:45 p to discuss his concerns regarding treatment.

## 2018-08-17 NOTE — Progress Notes (Signed)
[facility] OFFICE PROGRESS NOTE  Patient Care Team: Derinda Late, MD as PCP - General (Family Medicine) Margaretha Sheffield, MD (Otolaryngology) Telford Nab, RN as Registered Nurse Nestor Lewandowsky, MD as Referring Physician (Cardiothoracic Surgery) Cammie Sickle, MD as Consulting Physician (Internal Medicine)  Cancer Staging No matching staging information was found for the patient.   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   # JUNE 2019- GEM; SEP 9th CT scan- Progression  # Hx of CVA Right hand; no residual deficits [left sided carotid block; no surgery done]- on Asa/plavix; CKD- stage III; DM-2; ? PN  # MOLECULAR TESTING- PDL-1- 1% [LOW]; [facility]- No actionable**  ----------------------------------------------------   DIAGNOSIS: [AUG 2019 ] ? [name]   STAGE:  IV  ;GOALS: Palliative  CURRENT/MOST RECENT THERAPY- June 2019- GEM     [name], [name]   08/11/2018 -  Chemotherapy    The patient had vinorelbine (NAVELBINE) 60 mg in sodium chloride 0.9 % 50 mL chemo infusion, 60 mg (original dose ), Intravenous,  Once, 0 of 4 cycles Dose modification: 30 mg/m2 (Cycle 1, Reason: Provider Judgment), 60 mg (Cycle 1, Reason: Provider  Judgment)  for chemotherapy treatment.        INTERVAL HISTORY:  Ian Shaffer 72 y.o.  male pleasant patient above history of likely mesothelioma most recently progressed on third line therapy on gemcitabine is here for follow-up.  In the interim patient was evaluated at [facility] for possible clinical trial.  Unfortunately no trials available.  In the interim patient was also diagnosed with bronchitis-treated with antibiotics overall seems to be improved in terms of breathing and cough.  He complains of worsening fatigue.  Denies any nausea vomiting.  Possible weight loss.  Shortness of breath on exertion.  Review of Systems  Constitutional: Positive for malaise/fatigue and weight loss. Negative for chills and diaphoresis.  HENT: Negative for nosebleeds and sore throat.   Eyes: Negative for double vision.  Respiratory: Positive for shortness of breath. Negative for cough, hemoptysis, sputum production and wheezing.   Cardiovascular: Negative for chest pain, palpitations, orthopnea and leg swelling.  Gastrointestinal: Negative for abdominal pain, blood in stool, constipation, diarrhea, melena and vomiting.  Genitourinary: Negative for dysuria, frequency and urgency.  Musculoskeletal: Negative for back pain and joint pain.  Skin: Negative.  Negative for itching and rash.  Neurological: Negative for dizziness, tingling, focal weakness, weakness and headaches.  Endo/Heme/Allergies: Does not bruise/bleed easily.  Psychiatric/Behavioral: Negative for depression. The patient is not nervous/anxious and does not have insomnia.       PAST MEDICAL HISTORY :  Past Medical History:  Diagnosis Date  . Benign essential hypertension 05/31/2014  . Cancer ([place]) 06/2017   right lung   . Cerebral infarction ([place]) 05/31/2014  . Cerebrovascular disease 05/31/2014  . CKD (chronic kidney disease) stage 3, GFR 30-59 ml/min (HCC) 02/23/2016  . CVA (  cerebral infarction)   . Diabetes mellitus without  complication ([place])   . Difficult intubation   . DVT (deep venous thrombosis) ([place])   . Esophageal reflux 05/31/2014  . H/O: CVA (cerebrovascular accident)   . Hyperlipidemia   . Hypertension   . ICAO (internal carotid artery occlusion) March 01, 2014   Left  . 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 Pleural effusion 04/23/2017  . Stroke [facility]) April, 4,2015  . Type 2 diabetes mellitus with peripheral neuropathy ([place]) 02/23/2016  . Weakness     PAST SURGICAL HISTORY :   Past Surgical History:  Procedure Laterality Date  . CHEST TUBE INSERTION N/A 06/25/2017   Procedure: XTGGYI CATHETER INSERTION;  Surgeon: Nestor Lewandowsky, MD;  Location: [facility] ORS;  Service: Thoracic;  Laterality: N/A;  . CHEST TUBE INSERTION Right 08/07/2017   Procedure: REMOVAL OF PLEUR X CATHETER;  Surgeon: Nestor Lewandowsky, MD;  Location: [facility] ORS;  Service: General;  Laterality: Right;  . NASAL SINUS SURGERY  2000  . TONSILLECTOMY    . VIDEO ASSISTED THORACOSCOPY Right 06/25/2017   Procedure: VIDEO ASSISTED THORACOSCOPY WITH TALC PLEURODESIS, CHEST TUBE INSERTION;  Surgeon: Nestor Lewandowsky, MD;  Location: [facility] ORS;  Service: Thoracic;  Laterality: Right;    FAMILY HISTORY :   Family History  Problem Relation Age of Onset  . Hypertension Mother   . Hypertension Father     SOCIAL HISTORY:   Social History   Tobacco Use  . Smoking status: Former Smoker    Types: Pipe    Last attempt to quit: 05/07/1974    Years since quitting: 44.3  . Smokeless tobacco: Never Used  . Tobacco comment: pt states he smoked a pipe a long time ago  Substance Use Topics  . Alcohol use: No  . Drug use: No    ALLERGIES:  is allergic to sulfa antibiotics; adhesive [tape]; amoxicillin; and other.  MEDICATIONS:  Current Outpatient Medications  Medication Sig Dispense Refill  . acetaminophen (TYLENOL) 325 MG tablet Take 2 tablets (650 mg total) by mouth every 6 (six) hours as needed for mild pain (or Fever >/= 101).     Marland Kitchen amLODipine (NORVASC) 5 MG tablet Take 1 tablet by mouth daily.    Marland Kitchen aspirin EC 81 MG tablet Take 81 mg by mouth daily.    . clopidogrel (PLAVIX) 75 MG tablet Take 75 mg by mouth daily.    . fluticasone (FLONASE) 50 MCG/ACT nasal spray Place 1 spray into both nostrils daily at 2 PM.     . lansoprazole (PREVACID) 30 MG capsule TAKE 1 CAPSULE EVERY DAY BEFORE BREAKFAST 30 capsule 4  . levofloxacin (LEVAQUIN) 750 MG tablet Take 1 tablet (750 mg total) by mouth every other day. 4 tablet 0  . liraglutide (VICTOZA) 18 MG/3ML SOPN Inject 1.2 mg into the skin daily.    . metoprolol succinate (TOPROL-XL) 25 MG 24 hr tablet Take 25 mg by mouth daily.    . montelukast (SINGULAIR) 10 MG tablet Take 1 tablet (10 mg total) by mouth at bedtime. Take 1 tablet-10 mg night prior x 3 days. 40 tablet 3  . Multiple Vitamins-Minerals (CENTRUM SILVER PO) Take 1 tablet by mouth daily.    . ondansetron (ZOFRAN) 8 MG tablet Take 8 mg by mouth every 8 (eight) hours as needed for nausea or refractory nausea / vomiting.     Marland Kitchen oxyCODONE (OXY IR/ROXICODONE) 5 MG immediate release tablet Take 1 tablet (5 mg total) by mouth every 4 (four)  hours as needed for severe pain. 30 tablet 0  . polyethylene glycol powder (GLYCOLAX/MIRALAX) powder Take 17 g by mouth daily as needed (for constipation.).    Marland Kitchen pravastatin (PRAVACHOL) 40 MG tablet Take 40 mg by mouth daily.    . prochlorperazine (COMPAZINE) 10 MG tablet Take 10 mg by mouth every 8 (eight) hours as needed for vomiting.     . triamcinolone cream (KENALOG) 0.1 % Apply topically 2 (two) times daily. 30 g 0   No current facility-administered medications for this visit.     PHYSICAL EXAMINATION: ECOG PERFORMANCE STATUS: 0 - Asymptomatic  BP (!) 143/75 (BP Location: Left Arm, Patient Position: Sitting)   Pulse 87   Temp 98.6 F (37 C) (Tympanic)   Resp 16   Wt 184 lb 9.6 oz (83.7 kg)   BMI 26.49 kg/m   Filed Weights   08/17/18 1350  Weight: 184 lb 9.6 oz (83.7 kg)     Physical Exam  Constitutional: He is oriented to person, place, and time and well-developed, well-nourished, and in no distress.  He is walking by himself.  He is alone.  HENT:  Head: Normocephalic and atraumatic.  Mouth/Throat: Oropharynx is clear and moist. No oropharyngeal exudate.  Eyes: Pupils are equal, round, and reactive to light.  Neck: Normal range of motion. Neck supple.  Cardiovascular: Normal rate and regular rhythm.  Pulmonary/Chest: No respiratory distress. He has no wheezes.  Decreased breath sounds bilaterally.  Abdominal: Soft. Bowel sounds are normal. He exhibits no distension and no mass. There is no tenderness. There is no rebound and no guarding.  Musculoskeletal: Normal range of motion. He exhibits no edema or tenderness.  Neurological: He is alert and oriented to person, place, and time.  Skin: Skin is warm.  Psychiatric: Affect normal.       LABORATORY DATA:  I have reviewed the data as listed    Component Value Date/Time   NA 139 08/11/2018 0959   K 4.3 08/11/2018 0959   CL 104 08/11/2018 0959   CO2 23 08/11/2018 0959   GLUCOSE 155 (H) 08/11/2018 0959   BUN 24 (H) 08/11/2018 0959   CREATININE 1.62 (H) 08/11/2018 0959   CALCIUM 9.2 08/11/2018 0959   PROT 7.0 08/11/2018 0959   ALBUMIN 3.5 08/11/2018 0959   AST 30 08/11/2018 0959   ALT 22 08/11/2018 0959   ALKPHOS 195 (H) 08/11/2018 0959   BILITOT 0.6 08/11/2018 0959   GFRNONAA 40 (L) 08/11/2018 0959   GFRAA 47 (L) 08/11/2018 0959    No results found for: SPEP, UPEP  Lab Results  Component Value Date   WBC 12.8 (H) 08/11/2018   NEUTROABS 9.7 (H) 08/11/2018   HGB 10.7 (L) 08/11/2018   HCT 32.0 (L) 08/11/2018   MCV 91.0 08/11/2018   PLT 386 08/11/2018      Chemistry      Component Value Date/Time   NA 139 08/11/2018 0959   K 4.3 08/11/2018 0959   CL 104 08/11/2018 0959   CO2 23 08/11/2018 0959   BUN 24 (H) 08/11/2018 0959   CREATININE 1.62 (H) 08/11/2018 0959      Component  Value Date/Time   CALCIUM 9.2 08/11/2018 0959   ALKPHOS 195 (H) 08/11/2018 0959   AST 30 08/11/2018 0959   ALT 22 08/11/2018 0959   BILITOT 0.6 08/11/2018 0959       RADIOGRAPHIC STUDIES: I have personally reviewed the radiological images as listed and agreed with the findings in the report. No results  found.   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   #Patient most recently progressed on third line gemcitabine chemotherapy.  Unfortunately no clinical trials available at [facility].  Patient not interested in trying to become centers.  Patient has been offered vinorelbine as a next line of therapy.  #Patient understands the treatments are palliative; and unfortunately the responses rates are limited/stable disease at best.  Patient states that he is not ready for chemotherapy yet.  He wants to hold off starting chemotherapy at this time.  # Reflux stable.  # CKD- stage III creat-STABLE.   # Flu shot-okay with flu shot.  #Right shoulder pain question referred pain from the malignancy involving the diaphragm.  Have asked the patient to have the MRI report faxed over to us/ [facility].  #I discussed regarding palliative care evaluation; patient interested.  #Follow-up in 2 weeks/labs possible vinorelbine.    Orders Placed This Encounter  Procedures  . Comprehensive metabolic panel    Standing Status:   Future    Standing Expiration Date:   08/18/2019  . CBC with Differential    Standing Status:   Future    Standing Expiration Date:   08/18/2019   All questions were answered. The patient knows to call the clinic with any problems, questions or concerns.      Cammie Sickle, MD 08/17/2018 10:44 PM

## 2018-08-17 NOTE — Telephone Encounter (Signed)
-----   Message from Festus Holts sent at 08/17/2018 10:40 AM EDT ----- Regarding: Tx  Hi,  The above mentioned patient, is asking to postpone his Treatment. Patient No Showed Tx schd prior. Told patient I will advise MD and team of his request.     Verdis Frederickson

## 2018-08-17 NOTE — Assessment & Plan Note (Addendum)
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   #Patient most recently progressed on third line gemcitabine chemotherapy.  Unfortunately no clinical trials available at [facility].  Patient not interested in trying to become centers.  Patient has been offered vinorelbine as a next line of therapy.  #Patient understands the treatments are palliative; and unfortunately the responses rates are limited/stable disease at best.  Patient states that he is not ready for chemotherapy yet.  He wants to hold off starting chemotherapy at this time.  # Reflux stable.  # CKD- stage III creat-STABLE.   # Flu shot-okay with flu shot.  #Right shoulder pain question referred pain from the malignancy involving the diaphragm.  Have asked the patient to have the MRI report faxed over to us/ [facility].  #I discussed regarding palliative care evaluation; patient interested.  #Follow-up in 2 weeks/labs possible vinorelbine.

## 2018-08-19 ENCOUNTER — Inpatient Hospital Stay: Payer: Medicare Other | Admitting: Internal Medicine

## 2018-08-19 ENCOUNTER — Inpatient Hospital Stay: Payer: Medicare Other

## 2018-08-25 ENCOUNTER — Telehealth: Payer: Self-pay | Admitting: *Deleted

## 2018-08-25 ENCOUNTER — Other Ambulatory Visit: Payer: Self-pay

## 2018-08-25 ENCOUNTER — Inpatient Hospital Stay: Payer: Medicare Other | Attending: Internal Medicine

## 2018-08-25 ENCOUNTER — Inpatient Hospital Stay (HOSPITAL_BASED_OUTPATIENT_CLINIC_OR_DEPARTMENT_OTHER): Payer: Medicare Other | Admitting: Nurse Practitioner

## 2018-08-25 ENCOUNTER — Inpatient Hospital Stay: Payer: Medicare Other

## 2018-08-25 ENCOUNTER — Encounter: Payer: Self-pay | Admitting: Nurse Practitioner

## 2018-08-25 VITALS — BP 128/80 | HR 94 | Temp 98.9°F | Resp 16 | Wt 179.7 lb

## 2018-08-25 DIAGNOSIS — I129 Hypertensive chronic kidney disease with stage 1 through stage 4 chronic kidney disease, or unspecified chronic kidney disease: Secondary | ICD-10-CM | POA: Insufficient documentation

## 2018-08-25 DIAGNOSIS — G893 Neoplasm related pain (acute) (chronic): Secondary | ICD-10-CM

## 2018-08-25 DIAGNOSIS — D649 Anemia, unspecified: Secondary | ICD-10-CM

## 2018-08-25 DIAGNOSIS — K921 Melena: Secondary | ICD-10-CM | POA: Insufficient documentation

## 2018-08-25 DIAGNOSIS — C459 Mesothelioma, unspecified: Secondary | ICD-10-CM | POA: Diagnosis not present

## 2018-08-25 DIAGNOSIS — F5089 Other specified eating disorder: Secondary | ICD-10-CM | POA: Insufficient documentation

## 2018-08-25 DIAGNOSIS — Z9221 Personal history of antineoplastic chemotherapy: Secondary | ICD-10-CM | POA: Insufficient documentation

## 2018-08-25 DIAGNOSIS — Z87891 Personal history of nicotine dependence: Secondary | ICD-10-CM | POA: Insufficient documentation

## 2018-08-25 DIAGNOSIS — R530 Neoplastic (malignant) related fatigue: Secondary | ICD-10-CM | POA: Insufficient documentation

## 2018-08-25 DIAGNOSIS — E1122 Type 2 diabetes mellitus with diabetic chronic kidney disease: Secondary | ICD-10-CM | POA: Insufficient documentation

## 2018-08-25 DIAGNOSIS — E1142 Type 2 diabetes mellitus with diabetic polyneuropathy: Secondary | ICD-10-CM | POA: Insufficient documentation

## 2018-08-25 DIAGNOSIS — Z79899 Other long term (current) drug therapy: Secondary | ICD-10-CM | POA: Insufficient documentation

## 2018-08-25 DIAGNOSIS — Z8673 Personal history of transient ischemic attack (TIA), and cerebral infarction without residual deficits: Secondary | ICD-10-CM | POA: Insufficient documentation

## 2018-08-25 DIAGNOSIS — Z7982 Long term (current) use of aspirin: Secondary | ICD-10-CM | POA: Insufficient documentation

## 2018-08-25 DIAGNOSIS — Z7902 Long term (current) use of antithrombotics/antiplatelets: Secondary | ICD-10-CM | POA: Insufficient documentation

## 2018-08-25 DIAGNOSIS — I951 Orthostatic hypotension: Secondary | ICD-10-CM | POA: Insufficient documentation

## 2018-08-25 DIAGNOSIS — R531 Weakness: Secondary | ICD-10-CM

## 2018-08-25 DIAGNOSIS — R1011 Right upper quadrant pain: Secondary | ICD-10-CM | POA: Insufficient documentation

## 2018-08-25 DIAGNOSIS — C787 Secondary malignant neoplasm of liver and intrahepatic bile duct: Secondary | ICD-10-CM | POA: Diagnosis not present

## 2018-08-25 DIAGNOSIS — K219 Gastro-esophageal reflux disease without esophagitis: Secondary | ICD-10-CM | POA: Insufficient documentation

## 2018-08-25 DIAGNOSIS — Z8711 Personal history of peptic ulcer disease: Secondary | ICD-10-CM | POA: Insufficient documentation

## 2018-08-25 DIAGNOSIS — Z791 Long term (current) use of non-steroidal anti-inflammatories (NSAID): Secondary | ICD-10-CM | POA: Insufficient documentation

## 2018-08-25 DIAGNOSIS — N189 Chronic kidney disease, unspecified: Secondary | ICD-10-CM | POA: Insufficient documentation

## 2018-08-25 LAB — CBC WITH DIFFERENTIAL/PLATELET
BASOS ABS: 0.1 10*3/uL (ref 0–0.1)
Basophils Relative: 0 %
EOS PCT: 1 %
Eosinophils Absolute: 0.1 10*3/uL (ref 0–0.7)
HEMATOCRIT: 26.9 % — AB (ref 40.0–52.0)
Hemoglobin: 9 g/dL — ABNORMAL LOW (ref 13.0–18.0)
LYMPHS ABS: 0.9 10*3/uL — AB (ref 1.0–3.6)
Lymphocytes Relative: 7 %
MCH: 29.6 pg (ref 26.0–34.0)
MCHC: 33.3 g/dL (ref 32.0–36.0)
MCV: 88.6 fL (ref 80.0–100.0)
MONO ABS: 0.8 10*3/uL (ref 0.2–1.0)
Monocytes Relative: 6 %
NEUTROS ABS: 11.4 10*3/uL — AB (ref 1.4–6.5)
Neutrophils Relative %: 86 %
PLATELETS: 350 10*3/uL (ref 150–440)
RBC: 3.04 MIL/uL — AB (ref 4.40–5.90)
RDW: 18.9 % — AB (ref 11.5–14.5)
WBC: 13.3 10*3/uL — ABNORMAL HIGH (ref 3.8–10.6)

## 2018-08-25 LAB — COMPREHENSIVE METABOLIC PANEL
ALT: 22 U/L (ref 0–44)
ANION GAP: 11 (ref 5–15)
AST: 31 U/L (ref 15–41)
Albumin: 3.2 g/dL — ABNORMAL LOW (ref 3.5–5.0)
Alkaline Phosphatase: 294 U/L — ABNORMAL HIGH (ref 38–126)
BILIRUBIN TOTAL: 0.2 mg/dL — AB (ref 0.3–1.2)
BUN: 22 mg/dL (ref 8–23)
CHLORIDE: 103 mmol/L (ref 98–111)
CO2: 25 mmol/L (ref 22–32)
Calcium: 9.6 mg/dL (ref 8.9–10.3)
Creatinine, Ser: 1.38 mg/dL — ABNORMAL HIGH (ref 0.61–1.24)
GFR, EST AFRICAN AMERICAN: 57 mL/min — AB (ref >60)
GFR, EST NON AFRICAN AMERICAN: 49 mL/min — AB (ref >60)
Glucose, Bld: 176 mg/dL — ABNORMAL HIGH (ref 70–99)
POTASSIUM: 4.4 mmol/L (ref 3.5–5.1)
Sodium: 139 mmol/L (ref 135–145)
TOTAL PROTEIN: 6.9 g/dL (ref 6.5–8.1)

## 2018-08-25 MED ORDER — DRONABINOL 2.5 MG PO CAPS: 2.5000 mg | ORAL_CAPSULE | Freq: Two times a day (BID) | ORAL | 0 refills | Status: DC

## 2018-08-25 MED ORDER — PANTOPRAZOLE SODIUM 20 MG PO TBEC: 20.0000 mg | DELAYED_RELEASE_TABLET | Freq: Two times a day (BID) | ORAL | 0 refills | Status: DC

## 2018-08-25 MED ORDER — MORPHINE SULFATE (PF) 2 MG/ML IV SOLN
2.0000 mg | Freq: Once | INTRAVENOUS | Status: AC
  Administered 2018-08-25: 2 mg via INTRAMUSCULAR
  Filled 2018-08-25: qty 1

## 2018-08-25 NOTE — Patient Instructions (Signed)
Gastrointestinal Bleeding °Gastrointestinal (GI) bleeding is bleeding somewhere along the digestive tract, between the mouth and anus. This can be caused by various problems. The severity of these problems can range from mild to serious or even life-threatening. If you have GI bleeding, you may find blood in your stools (feces), you may have black stools, or you may vomit blood. If there is a lot of bleeding, you may need to stay in the hospital. °What are the causes? °This condition may be caused by: °· Esophagitis. This is inflammation, irritation, or swelling of the esophagus. °· Hemorrhoids. These are swollen veins in the rectum. °· Anal fissures. These are areas of painful tearing that are often caused by passing hard stool. °· Diverticulosis. These are pouches that form on the colon over time, with age, and may bleed a lot. °· Diverticulitis. This is inflammation in areas with diverticulosis. It can cause pain, fever, and bloody stools, although bleeding may be mild. °· Polyps and cancer. Colon cancer often starts out as precancerous polyps. °· Gastritis and ulcers. With these, bleeding may come from the upper GI tract, near the stomach. ° °What are the signs or symptoms? °Symptoms of this condition may include: °· Bright red blood in your vomit, or vomit that looks like coffee grounds. °· Bloody, black, or tarry stools. °? Bleeding from the lower GI tract will usually cause red or maroon blood in the stools. °? Bleeding from the upper GI tract may cause black, tarry, often bad-smelling stools. °? In certain cases, if the bleeding is fast enough, the stools may be red. °· Pain or cramping in the abdomen. ° °How is this diagnosed? °This condition may be diagnosed based on: °· Medical history and physical exam. °· Various tests, such as: °? Blood tests. °? X-rays and other imaging tests. °? Esophagogastroduodenoscopy (EGD). In this test, a flexible, lighted tube is used to look at your esophagus, stomach, and  small intestine. °? Colonoscopy. In this test, a flexible, lighted tube is used to look at your colon. ° °How is this treated? °Treatment for this condition depends on the cause of the bleeding. For example: °· For bleeding from the esophagus, stomach, small intestine, or colon, the health care provider doing your EGD or colonoscopy may be able to stop the bleeding as part of the procedure. °· Inflammation or infection of the colon can be treated with medicines. °· Certain rectal problems can be treated with creams, suppositories, or warm baths. °· Surgery is sometimes needed. °· Blood transfusions are sometimes needed if a lot of blood has been lost. ° °If bleeding is slow, you may be allowed to go home. If there is a lot of bleeding, you will need to stay in the hospital for observation. °Follow these instructions at home: °· Take over-the-counter and prescription medicines only as told by your health care provider. °· Eat foods that are high in fiber. This will help to keep your stools soft. These foods include whole grains, legumes, fruits, and vegetables. Eating 1-3 prunes each day works well for many people. °· Drink enough fluid to keep your urine clear or pale yellow. °· Keep all follow-up visits as told by your health care provider. This is important. °Contact a health care provider if: °· Your symptoms do not improve. °Get help right away if: °· Your bleeding increases. °· You feel light-headed or you faint. °· You feel weak. °· You have severe cramps in your back or abdomen. °· You pass large blood clots in your stool. °·   Your symptoms are getting worse. °This information is not intended to replace advice given to you by your health care provider. Make sure you discuss any questions you have with your health care provider. °Document Released: 11/08/2000 Document Revised: 04/10/2016 Document Reviewed: 05/01/2015 °Elsevier Interactive Patient Education © 2018 [employer] ° °

## 2018-08-25 NOTE — Progress Notes (Signed)
Symptom Management [place]  Telephone:([phone]) [phone] Fax:(336) [phone]  Patient Care Team: Derinda Late, MD as PCP - General (Family Medicine) Margaretha Sheffield, MD (Otolaryngology) Telford Nab, RN as Registered Nurse Nestor Lewandowsky, MD as Referring Physician (Cardiothoracic Surgery) Cammie Sickle, MD as Consulting Physician (Internal Medicine)   Name of the patient: Ian Shaffer  [id number]  05-19-1946   Date of visit: 08/25/18  Diagnosis-malignant mesothelioma  Chief complaint/ Reason for visit- Melena  Heme/Onc history:  Oncology History   # July-AUG 2018- right pleural based mass ~10 cm [ above & below diaphragm]; s/p VATS- Dr.Oaks-Bx- 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   # JUNE 2019- GEM; SEP 9th CT scan- Progression  # Hx of CVA Right hand; no residual deficits [left sided carotid block; no surgery done]- on Asa/plavix; CKD- stage III; DM-2; ? PN  # MOLECULAR TESTING- PDL-1- 1% [LOW]; [facility]- No actionable**  ----------------------------------------------------   DIAGNOSIS: [AUG 2019 ] ? [name]   STAGE:  IV  ;GOALS: Palliative  CURRENT/MOST RECENT THERAPY- June 2019- GEM     [name], malignant ([place])   08/11/2018 -  Chemotherapy    The patient had vinorelbine (NAVELBINE) 60 mg in sodium chloride 0.9 % 50 mL chemo  infusion, 60 mg (original dose ), Intravenous,  Once, 0 of 4 cycles Dose modification: 30 mg/m2 (Cycle 1, Reason: Provider Judgment), 60 mg (Cycle 1, Reason: Provider Judgment)  for chemotherapy treatment.      Interval history- Ian Shaffer reports melena. Symptoms have been present for approximately 2 weeks. The symptoms are unchanged. This has been associated with early satiety, melena and upper abdominal discomfort.   He denies abdominal bloating, belching and eructation, bilious reflux, chest pain, choking on food, hematemesis and laryngitis. He has not used nonsteroidal anti-inflammatory drugs on a regular bases. he is on plavix and aspirin for prior cva in 2015.   The patient admits to abdominal pain, located in the RUQ. The pain is described as aching, and is 6/10 in intensity.  There is a past history of duodenal ulcers in the 1980s.  He is only taken 2 doses of oxycodone 5 mg for pain which he says improves his pain to 3 of 10.  Expresses concern over being addicted.  Has been using CBD oil for poor sleep with some improvement in his symptoms.  Denies any fevers, vomiting, constipation, or diarrhea.  Denies urinary complaints.  Wife who accompanies patient today expresses concern for his significant and marked fatigue which she says is worsening. He met with Dr. Rogue Bussing on 08/17/2018 and discussed initiation of fourth line vinorelbine.  He says that given his current symptoms he does not feel well enough to start treatment.  Unfortunately no clinical trials were available to him at [facility].  ECOG FS:1 - Symptomatic but completely ambulatory  Review of systems- Review of Systems  Constitutional: Positive for malaise/fatigue. Negative for chills, fever and weight loss.  HENT: Negative for congestion, ear discharge, ear pain, sinus  pain, sore throat and tinnitus.   Eyes: Negative.   Respiratory: Negative.  Negative for cough, sputum production and shortness of breath.   Cardiovascular:  Negative for chest pain, palpitations, orthopnea, claudication and leg swelling.  Gastrointestinal: Positive for melena and nausea (intermittent, mild. early satiety.). Negative for abdominal pain, blood in stool, constipation, diarrhea, heartburn and vomiting.  Genitourinary: Negative.   Musculoskeletal: Negative.   Skin: Negative.   Neurological: Positive for weakness. Negative for dizziness, tingling and headaches.  Endo/Heme/Allergies: Negative.   Psychiatric/Behavioral: The patient has insomnia (poor sleep w/ daytime drowsiness).      Current treatment- s/p gemcitabine; considering initiation of vinorelbine  Allergies  Allergen Reactions  . Sulfa Antibiotics Swelling    Facial swelling No tongue or lips swelling, no difficulty breathing.  . Adhesive [Tape] Itching and Rash  . Amoxicillin Nausea Only and Other (See Comments)    Has patient had a PCN reaction causing immediate rash, facial/tongue/throat swelling, SOB or lightheadedness with hypotension: No Has patient had a PCN reaction causing severe rash involving mucus membranes or skin necrosis: No Has patient had a PCN reaction that required hospitalization: No Has patient had a PCN reaction occurring within the last 10 years: Yes If all of the above answers are "NO", then may proceed with Cephalosporin use.   . Other Rash    Surgical tape    Past Medical History:  Diagnosis Date  . Benign essential hypertension 05/31/2014  . Cancer ([place]) 06/2017   right lung   . Cerebral infarction ([place]) 05/31/2014  . Cerebrovascular disease 05/31/2014  . CKD (chronic kidney disease) stage 3, GFR 30-59 ml/min (HCC) 02/23/2016  . CVA (cerebral infarction)   . Diabetes mellitus without complication ([place])   . Difficult intubation   . DVT (deep venous thrombosis) ([place])   . Esophageal reflux 05/31/2014  . H/O: CVA (cerebrovascular accident)   . Hyperlipidemia   . Hypertension   . ICAO (internal carotid artery occlusion) March 01, 2014   Left    . 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 Pleural effusion 04/23/2017  . Stroke [facility]) April, 4,2015  . Type 2 diabetes mellitus with peripheral neuropathy ([place]) 02/23/2016  . Weakness     Past Surgical History:  Procedure Laterality Date  . CHEST TUBE INSERTION N/A 06/25/2017   Procedure: BWGYKZ CATHETER INSERTION;  Surgeon: Nestor Lewandowsky, MD;  Location: [facility] ORS;  Service: Thoracic;  Laterality: N/A;  . CHEST TUBE INSERTION Right 08/07/2017   Procedure: REMOVAL OF PLEUR X CATHETER;  Surgeon: Nestor Lewandowsky, MD;  Location: [facility] ORS;  Service: General;  Laterality: Right;  . NASAL SINUS SURGERY  2000  . TONSILLECTOMY    . VIDEO ASSISTED THORACOSCOPY Right 06/25/2017   Procedure: VIDEO ASSISTED THORACOSCOPY WITH TALC PLEURODESIS, CHEST TUBE INSERTION;  Surgeon: Nestor Lewandowsky, MD;  Location: [facility] ORS;  Service: Thoracic;  Laterality: Right;    Social History   Socioeconomic History  . Marital status: Married    Spouse name: Vaughan Basta  . Number of children: Not on file  . Years of education: Not on file  . Highest education level: Not on file  Occupational History  . Not on file  Social Needs  . Financial resource strain: Not hard at all  . Food insecurity:    Worry: Patient refused    Inability: Patient refused  . Transportation needs:    Medical: Patient refused    Non-medical: Patient refused  Tobacco Use  . Smoking status: Former Smoker  Types: Pipe    Last attempt to quit: 05/07/1974    Years since quitting: 44.3  . Smokeless tobacco: Never Used  . Tobacco comment: pt states he smoked a pipe a long time ago  Substance and Sexual Activity  . Alcohol use: No  . Drug use: No  . Sexual activity: Not Currently  Lifestyle  . Physical activity:    Days per week: Patient refused    Minutes per session: Patient refused  . Stress: Only a little  Relationships  . Social connections:    Talks on phone: Patient refused    Gets together: Patient refused     Attends religious service: Patient refused    Active member of club or organization: Patient refused    Attends meetings of clubs or organizations: Patient refused    Relationship status: Patient refused  . Intimate partner violence:    Fear of current or ex partner: Patient refused    Emotionally abused: Patient refused    Physically abused: Patient refused    Forced sexual activity: Patient refused  Other Topics Concern  . Not on file  Social History Narrative  . Not on file    Family History  Problem Relation Age of Onset  . Hypertension Mother   . Hypertension Father     Current Outpatient Medications:  .  acetaminophen (TYLENOL) 325 MG tablet, Take 2 tablets (650 mg total) by mouth every 6 (six) hours as needed for mild pain (or Fever >/= 101)., Disp: , Rfl:  .  amLODipine (NORVASC) 5 MG tablet, Take 1 tablet by mouth daily., Disp: , Rfl:  .  aspirin EC 81 MG tablet, Take 81 mg by mouth daily., Disp: , Rfl:  .  clopidogrel (PLAVIX) 75 MG tablet, Take 75 mg by mouth daily., Disp: , Rfl:  .  fluticasone (FLONASE) 50 MCG/ACT nasal spray, Place 1 spray into both nostrils daily at 2 PM. , Disp: , Rfl:  .  lansoprazole (PREVACID) 30 MG capsule, TAKE 1 CAPSULE EVERY DAY BEFORE BREAKFAST, Disp: 30 capsule, Rfl: 4 .  liraglutide (VICTOZA) 18 MG/3ML SOPN, Inject 1.2 mg into the skin daily., Disp: , Rfl:  .  metoprolol succinate (TOPROL-XL) 25 MG 24 hr tablet, Take 25 mg by mouth daily., Disp: , Rfl:  .  montelukast (SINGULAIR) 10 MG tablet, Take 1 tablet (10 mg total) by mouth at bedtime. Take 1 tablet-10 mg night prior x 3 days., Disp: 40 tablet, Rfl: 3 .  Multiple Vitamins-Minerals (CENTRUM SILVER PO), Take 1 tablet by mouth daily., Disp: , Rfl:  .  ondansetron (ZOFRAN) 8 MG tablet, Take 8 mg by mouth every 8 (eight) hours as needed for nausea or refractory nausea / vomiting. , Disp: , Rfl:  .  oxyCODONE (OXY IR/ROXICODONE) 5 MG immediate release tablet, Take 1 tablet (5 mg total) by  mouth every 4 (four) hours as needed for severe pain., Disp: 30 tablet, Rfl: 0 .  polyethylene glycol powder (GLYCOLAX/MIRALAX) powder, Take 17 g by mouth daily as needed (for constipation.)., Disp: , Rfl:  .  pravastatin (PRAVACHOL) 40 MG tablet, Take 40 mg by mouth daily., Disp: , Rfl:  .  prochlorperazine (COMPAZINE) 10 MG tablet, Take 10 mg by mouth every 8 (eight) hours as needed for vomiting. , Disp: , Rfl:  .  triamcinolone cream (KENALOG) 0.1 %, Apply topically 2 (two) times daily., Disp: 30 g, Rfl: 0 .  dronabinol (MARINOL) 2.5 MG capsule, Take 1 capsule (2.5 mg total) by mouth 2 (  two) times daily before a meal., Disp: 60 capsule, Rfl: 0 .  pantoprazole (PROTONIX) 20 MG tablet, Take 1 tablet (20 mg total) by mouth 2 (two) times daily., Disp: 60 tablet, Rfl: 0  Physical exam:  Vitals:   08/25/18 1325  BP: 128/80  Pulse: 94  Resp: 16  Temp: 98.9 F (37.2 C)  TempSrc: Tympanic  SpO2: 97%  Weight: 179 lb 11.2 oz (81.5 kg)   Physical Exam  Constitutional: He is oriented to person, place, and time. He appears well-developed and well-nourished. He appears lethargic. No distress.  Accompanied.  HENT:  Head: Normocephalic and atraumatic.  Nose: Nose normal.  Mouth/Throat: Oropharynx is clear and moist. No oropharyngeal exudate.  Eyes: Pupils are equal, round, and reactive to light. Conjunctivae and EOM are normal. No scleral icterus.  Neck: Normal range of motion. Neck supple.  Cardiovascular: Normal rate, regular rhythm and normal heart sounds.  Pulmonary/Chest: Effort normal. He has decreased breath sounds. He has no wheezes.  Abdominal: Soft. Bowel sounds are normal. He exhibits no distension. There is no tenderness.  Genitourinary: Guaiac stool: insufficient sample.  Musculoskeletal: Normal range of motion. He exhibits no edema.  Neurological: He is oriented to person, place, and time. He appears lethargic.  Skin: Skin is warm and dry.  Psychiatric: He has a normal mood and  affect. His behavior is normal.     CMP Latest Ref Rng & Units 08/25/2018  Glucose 70 - 99 mg/dL 176(H)  BUN 8 - 23 mg/dL 22  Creatinine 0.61 - 1.24 mg/dL 1.38(H)  Sodium 135 - 145 mmol/L 139  Potassium 3.5 - 5.1 mmol/L 4.4  Chloride 98 - 111 mmol/L 103  CO2 22 - 32 mmol/L 25  Calcium 8.9 - 10.3 mg/dL 9.6  Total Protein 6.5 - 8.1 g/dL 6.9  Total Bilirubin 0.3 - 1.2 mg/dL 0.2(L)  Alkaline Phos 38 - 126 U/L 294(H)  AST 15 - 41 U/L 31  ALT 0 - 44 U/L 22   CBC Latest Ref Rng & Units 08/25/2018  WBC 3.8 - 10.6 K/uL 13.3(H)  Hemoglobin 13.0 - 18.0 g/dL 9.0(L)  Hematocrit 40.0 - 52.0 % 26.9(L)  Platelets 150 - 440 K/uL 350    No images are attached to the encounter.  Dg Chest 2 View  Result Date: 08/11/2018 CLINICAL DATA:  History of mesothelioma, chest pain and fever EXAM: CHEST - 2 VIEW COMPARISON:  CT chest of 08/03/2018 and chest x-ray of 05/20/2017 FINDINGS: Multiple lung nodules again are noted throughout the lungs consistent with diffuse metastatic involvement of the lungs. The largest nodule at the left lung base currently measures 36 mm in diameter compared to prior measurement of approximately 30 mm on recent CT. This may be some discrepancy in matter measurement, but the lesions have certainly not diminished in size. A tiny right pleural effusion cannot be excluded. Mediastinal and hilar contours are unchanged and heart size is stable. No bony abnormality is noted other than degenerative change throughout the thoracic spine. IMPRESSION: Diffuse lung metastases again are noted possibly slightly larger. No pneumonia is seen. A tiny right pleural effusion cannot be excluded Electronically Signed   By: Ivar Drape M.D.   On: 08/11/2018 10:00   Ct Chest Wo Contrast  Result Date: 08/03/2018 CLINICAL DATA:  History of mesothelioma diagnosed in August of 2018. Follow-up imaging. EXAM: CT CHEST WITHOUT CONTRAST TECHNIQUE: Multidetector CT imaging of the chest was performed following the  standard protocol without IV contrast. COMPARISON:  05/06/2018 FINDINGS: Cardiovascular: The  heart size appears within normal limits. No pericardial effusion. Aortic atherosclerosis. Calcification in the RCA, LAD and left circumflex coronary arteries. Mediastinum/Nodes: Normal appearance of the thyroid gland. The trachea appears patent and is midline. Normal appearance of the esophagus. No axillary or supraclavicular adenopathy. No mediastinal adenopathy identified. Lungs/Pleura: No pleural effusion. Again seen is pleural thickening overlying the posterior right lower lobe. The pleural base lung mass within the posterior right lower lobe measures 4.3 by 3.0 cm, image 117/3. Previously 4.9 x 2.7 cm. Multiple pulmonary parenchymal nodules are identified in both lungs compatible with metastatic disease. The index lesion within the right middle lobe measures 3.4 cm, image 100/4. Previously 1.0 cm. Index lesion within the left lower lobe measures 2.9 cm, image 89/4. Previously 1.5 cm. New lesion within the left upper lobe measures 1.2 cm, image 55/4. Anterior right upper lobe nodule measures 0.9 cm, image number 68/4. Previously 0.7 cm. Upper Abdomen: The large mass involving the posterior right lobe of liver measures 12.6 x 10.3 cm, image 24/3. Previously 11.0 x 7.9 cm. Soft tissue nodularity along the surface of the posterior liver has progressed. Dominant lesion measures 5.2 cm versus 4.9 cm previously. Adjacent nodule measures 2.2 cm, image 149/3. Previously 1.1 cm. Musculoskeletal: No chest wall mass or suspicious bone lesions identified. IMPRESSION: 1. Overall interval progression of disease. Although the primary lesion within the posterior right hemithorax is not significantly changed, bilateral pulmonary metastasis have increased in size and number when compared with previous exam. The mass involving the posterior right lobe of liver has also increased in size in the interval. Two subcapsular lesions along the  posterior aspect of the right lobe of liver have increased in the interval. 2.  Aortic Atherosclerosis (ICD10-I70.0). 3. Multi vessel coronary artery atherosclerotic calcifications. Electronically Signed   By: Kerby Moors M.D.   On: 08/03/2018 14:15   US Abdomen Limited Ruq  Result Date: 08/11/2018 CLINICAL DATA:  Upper abdominal pain. History of malignant mesothelioma EXAM: ULTRASOUND ABDOMEN LIMITED RIGHT UPPER QUADRANT COMPARISON:  PET-CT July 09, 2017. Chest CT including portions of the upper abdomen August 03, 2018 FINDINGS: Gallbladder: No gallstones or wall thickening visualized. There is no pericholecystic fluid. No sonographic Murphy sign noted by sonographer. Common bile duct: Diameter: 3 mm. No intrahepatic or extrahepatic biliary duct dilatation. Liver: There are mass lesions in the liver, felt to represent metastatic foci. The largest of these lesions is seen in the anterior segment right lobe of the liver measuring 14.8 x 8.5 x 13.8 cm. A second lesion in the right lobe measures 6.8 x 3.8 x 5.8 cm. A third lesion arising eccentrically from the right lobe of the liver measures 2.2 x 2.0 x 1.9 cm. Within normal limits in parenchymal echogenicity. Portal vein is patent on color Doppler imaging with normal direction of blood flow towards the liver. Right kidney appears somewhat echogenic. IMPRESSION: 1. Widespread metastatic disease throughout the right lobe of the liver. The larger lesions have a somewhat infiltrative appearance. 2. Right kidney appears somewhat echogenic. Question a degree of medical renal disease. 3. No demonstrable gallbladder pathology or biliary duct dilatation. Electronically Signed   By: Lowella Grip III M.D.   On: 08/11/2018 13:35    Assessment and plan- Patient is a 72 y.o. male diagnosed with malignant mesothelioma who presents to symptom management clinic for melena.  1.  Malignant mesothelioma-metastatic s/p 4 cycles of third line gemcitabine.  Imaging on  07/04/2018 showed progression of lung nodules, stable intrathoracic lesion, worsening liver  mass.  Unfortunately no clinical trials available at [facility].  Discussed vinorelbine as next line of therapy given with palliative intent.  Due to his current fatigue, he states he is not yet ready to start treatment and verbalizes understanding that responses are limited.  2.  Melena-new onset melena. Hmg today 9.0, previously 10.7 (08/11/18). Not tachycardic or hypotensive today. History of duodenal ulcers in 1980s.  Currently anticoagulated with Plavix and aspirin for CVA in 2015.  Discussed with Dr. Baldemar Lenis who agrees with holding Plavix.  Continue aspirin 81 mg daily.  Start Protonix 20 mg twice daily and refer to GI for evaluation consideration of scope; patient requests Dr. Vira Agar. Will recheck hemoglobin and hematocrit later this week for possible transfusion/hospitalization. Discussed ER precautions.   3. Pain associated with neoplasm-right upper quadrant abdominal pain consistent with worsening disease burden and liver. Pain interferes with quality of life and impairs sleep (see below).  Impaired appetite and fatigue as well. Morphine 68m IM given in clinic with mild improvement in pain. Start Marinol 2.5 mg twice daily.  4. Goal of Care-  We again discussed the incurable nature of this cancer with overall poor prognosis. The patient understands the goal of care is palliative. Introduced palliative care as specialized medical care for people living with serious illness and that it focuses on providing relief from symptoms and stress of serious illness. Discussed that the goal is to improve quality of life for patient and family while continuing to receive treatment.  Patient states he has discussed palliative care with Dr. BRogue Bussingbut is unsure on current status of referral.  We will follow-up with primary team clarification.  History and findings discussed with Dr. BRogue Bussingwho independently reviewed  patient's labs in contributed to and agreed upon plan of care.  rtc on 08/27/18 for labs & re-evaluation.    Visit Diagnosis 1. [name]   2. Anemia, unspecified type   3. Melena   4. Neoplasm related pain     Patient expressed understanding and was in agreement with this plan. He also understands that He can call clinic at any time with any questions, concerns, or complaints.   Thank you for allowing me to participate in the care of this very pleasant patient.   LBeckey Rutter DNP, AGNP-C CVal[place]at [phone](work cell) [phone](office)

## 2018-08-25 NOTE — Telephone Encounter (Signed)
Wife called asking that patient be seen today. She reports that he is very weak, sleeping all the time and that Lauren had mentioned that he may need some IV fluids. Discussed with Lauren Allen,NP and appointment for this afternoon with labs and IV fluids accepted. Requested to see Ander Purpura

## 2018-08-25 NOTE — Telephone Encounter (Signed)
Per Beckey Rutter, NP, called patient and told him that she spoke to dr. Baldemar Lenis and he agrees to stop the plavix for now, and to proceed with aspirin. He also recommended we add protonix twice a day to protect his stomach. He can stop the  Prevacid. Protonix RX was sent to his pharmacy. Patient verbalized understanding.     dhs

## 2018-08-26 ENCOUNTER — Telehealth: Payer: Self-pay | Admitting: *Deleted

## 2018-08-26 NOTE — Telephone Encounter (Signed)
Dronabinol needs prior authorization.

## 2018-08-26 NOTE — Telephone Encounter (Signed)
Dronabinol PA has been submitted and approved.

## 2018-08-27 ENCOUNTER — Inpatient Hospital Stay: Payer: Medicare Other

## 2018-08-27 ENCOUNTER — Inpatient Hospital Stay
Admission: AD | Admit: 2018-08-27 | Discharge: 2018-08-30 | DRG: 377 | Disposition: A | Discharge status: Home / Self Care | Payer: Medicare Other | Source: Ambulatory Visit | Attending: Specialist | Admitting: Specialist

## 2018-08-27 ENCOUNTER — Inpatient Hospital Stay (HOSPITAL_BASED_OUTPATIENT_CLINIC_OR_DEPARTMENT_OTHER): Payer: Medicare Other | Admitting: Nurse Practitioner

## 2018-08-27 ENCOUNTER — Encounter: Payer: Self-pay | Admitting: Nurse Practitioner

## 2018-08-27 ENCOUNTER — Other Ambulatory Visit: Payer: Self-pay

## 2018-08-27 VITALS — BP 137/86 | HR 77 | Temp 98.8°F | Resp 20

## 2018-08-27 DIAGNOSIS — Z7982 Long term (current) use of aspirin: Secondary | ICD-10-CM

## 2018-08-27 DIAGNOSIS — D649 Anemia, unspecified: Secondary | ICD-10-CM

## 2018-08-27 DIAGNOSIS — Z79899 Other long term (current) drug therapy: Secondary | ICD-10-CM

## 2018-08-27 DIAGNOSIS — R1011 Right upper quadrant pain: Secondary | ICD-10-CM

## 2018-08-27 DIAGNOSIS — K317 Polyp of stomach and duodenum: Secondary | ICD-10-CM | POA: Diagnosis present

## 2018-08-27 DIAGNOSIS — R530 Neoplastic (malignant) related fatigue: Secondary | ICD-10-CM | POA: Insufficient documentation

## 2018-08-27 DIAGNOSIS — Z7902 Long term (current) use of antithrombotics/antiplatelets: Secondary | ICD-10-CM

## 2018-08-27 DIAGNOSIS — Z882 Allergy status to sulfonamides status: Secondary | ICD-10-CM

## 2018-08-27 DIAGNOSIS — D500 Iron deficiency anemia secondary to blood loss (chronic): Secondary | ICD-10-CM | POA: Diagnosis present

## 2018-08-27 DIAGNOSIS — G893 Neoplasm related pain (acute) (chronic): Secondary | ICD-10-CM

## 2018-08-27 DIAGNOSIS — K921 Melena: Secondary | ICD-10-CM | POA: Diagnosis present

## 2018-08-27 DIAGNOSIS — Z881 Allergy status to other antibiotic agents status: Secondary | ICD-10-CM | POA: Diagnosis not present

## 2018-08-27 DIAGNOSIS — I129 Hypertensive chronic kidney disease with stage 1 through stage 4 chronic kidney disease, or unspecified chronic kidney disease: Secondary | ICD-10-CM | POA: Diagnosis present

## 2018-08-27 DIAGNOSIS — C459 Mesothelioma, unspecified: Secondary | ICD-10-CM

## 2018-08-27 DIAGNOSIS — Z8673 Personal history of transient ischemic attack (TIA), and cerebral infarction without residual deficits: Secondary | ICD-10-CM | POA: Diagnosis not present

## 2018-08-27 DIAGNOSIS — C457 Mesothelioma of other sites: Secondary | ICD-10-CM | POA: Diagnosis present

## 2018-08-27 DIAGNOSIS — R509 Fever, unspecified: Secondary | ICD-10-CM

## 2018-08-27 DIAGNOSIS — D62 Acute posthemorrhagic anemia: Secondary | ICD-10-CM | POA: Diagnosis present

## 2018-08-27 DIAGNOSIS — E1122 Type 2 diabetes mellitus with diabetic chronic kidney disease: Secondary | ICD-10-CM | POA: Diagnosis present

## 2018-08-27 DIAGNOSIS — E1142 Type 2 diabetes mellitus with diabetic polyneuropathy: Secondary | ICD-10-CM | POA: Diagnosis present

## 2018-08-27 DIAGNOSIS — E43 Unspecified severe protein-calorie malnutrition: Secondary | ICD-10-CM

## 2018-08-27 DIAGNOSIS — I951 Orthostatic hypotension: Secondary | ICD-10-CM | POA: Diagnosis not present

## 2018-08-27 DIAGNOSIS — Z6826 Body mass index (BMI) 26.0-26.9, adult: Secondary | ICD-10-CM

## 2018-08-27 DIAGNOSIS — K219 Gastro-esophageal reflux disease without esophagitis: Secondary | ICD-10-CM | POA: Diagnosis present

## 2018-08-27 DIAGNOSIS — Z87891 Personal history of nicotine dependence: Secondary | ICD-10-CM | POA: Diagnosis not present

## 2018-08-27 DIAGNOSIS — Z9221 Personal history of antineoplastic chemotherapy: Secondary | ICD-10-CM

## 2018-08-27 DIAGNOSIS — Z791 Long term (current) use of non-steroidal anti-inflammatories (NSAID): Secondary | ICD-10-CM

## 2018-08-27 DIAGNOSIS — E785 Hyperlipidemia, unspecified: Secondary | ICD-10-CM | POA: Diagnosis present

## 2018-08-27 DIAGNOSIS — Z86718 Personal history of other venous thrombosis and embolism: Secondary | ICD-10-CM | POA: Diagnosis not present

## 2018-08-27 DIAGNOSIS — N183 Chronic kidney disease, stage 3 (moderate): Secondary | ICD-10-CM | POA: Diagnosis present

## 2018-08-27 DIAGNOSIS — C787 Secondary malignant neoplasm of liver and intrahepatic bile duct: Secondary | ICD-10-CM | POA: Diagnosis not present

## 2018-08-27 DIAGNOSIS — Z7189 Other specified counseling: Secondary | ICD-10-CM

## 2018-08-27 DIAGNOSIS — R531 Weakness: Secondary | ICD-10-CM

## 2018-08-27 DIAGNOSIS — Z8711 Personal history of peptic ulcer disease: Secondary | ICD-10-CM

## 2018-08-27 LAB — PROTIME-INR
INR: 1.2
Prothrombin Time: 15.1 seconds (ref 11.4–15.2)

## 2018-08-27 LAB — COMPREHENSIVE METABOLIC PANEL
ALK PHOS: 300 U/L — AB (ref 38–126)
ALT: 22 U/L (ref 0–44)
ANION GAP: 11 (ref 5–15)
AST: 37 U/L (ref 15–41)
Albumin: 3.3 g/dL — ABNORMAL LOW (ref 3.5–5.0)
BILIRUBIN TOTAL: 0.5 mg/dL (ref 0.3–1.2)
BUN: 26 mg/dL — ABNORMAL HIGH (ref 8–23)
CALCIUM: 9.8 mg/dL (ref 8.9–10.3)
CO2: 25 mmol/L (ref 22–32)
Chloride: 103 mmol/L (ref 98–111)
Creatinine, Ser: 1.35 mg/dL — ABNORMAL HIGH (ref 0.61–1.24)
GFR, EST AFRICAN AMERICAN: 58 mL/min — AB (ref >60)
GFR, EST NON AFRICAN AMERICAN: 50 mL/min — AB (ref >60)
GLUCOSE: 129 mg/dL — AB (ref 70–99)
Potassium: 4.5 mmol/L (ref 3.5–5.1)
Sodium: 139 mmol/L (ref 135–145)
TOTAL PROTEIN: 7.4 g/dL (ref 6.5–8.1)

## 2018-08-27 LAB — CBC WITH DIFFERENTIAL/PLATELET
BASOS ABS: 0.1 10*3/uL (ref 0–0.1)
BASOS PCT: 1 %
Eosinophils Absolute: 0.1 10*3/uL (ref 0–0.7)
Eosinophils Relative: 1 %
HEMATOCRIT: 26.9 % — AB (ref 40.0–52.0)
Hemoglobin: 8.9 g/dL — ABNORMAL LOW (ref 13.0–18.0)
Lymphocytes Relative: 7 %
Lymphs Abs: 1 10*3/uL (ref 1.0–3.6)
MCH: 29.6 pg (ref 26.0–34.0)
MCHC: 33.1 g/dL (ref 32.0–36.0)
MCV: 89.3 fL (ref 80.0–100.0)
MONO ABS: 1 10*3/uL (ref 0.2–1.0)
Monocytes Relative: 7 %
NEUTROS PCT: 84 %
Neutro Abs: 11.3 10*3/uL — ABNORMAL HIGH (ref 1.4–6.5)
Platelets: 364 10*3/uL (ref 150–440)
RBC: 3.01 MIL/uL — AB (ref 4.40–5.90)
RDW: 18.5 % — AB (ref 11.5–14.5)
WBC: 13.4 10*3/uL — AB (ref 3.8–10.6)

## 2018-08-27 LAB — GLUCOSE, CAPILLARY
Glucose-Capillary: 155 mg/dL — ABNORMAL HIGH (ref 70–99)
Glucose-Capillary: 123 mg/dL — ABNORMAL HIGH (ref 70–99)

## 2018-08-27 LAB — TYPE AND SCREEN
ABO/RH(D): A POS
Antibody Screen: NEGATIVE

## 2018-08-27 LAB — HEMOGLOBIN AND HEMATOCRIT, BLOOD
HCT: 26.7 % — ABNORMAL LOW (ref 40.0–52.0)
Hemoglobin: 8.8 g/dL — ABNORMAL LOW (ref 13.0–18.0)

## 2018-08-27 LAB — MRSA PCR SCREENING: MRSA BY PCR: NEGATIVE

## 2018-08-27 LAB — HEMOGLOBIN: HEMOGLOBIN: 8.1 g/dL — AB (ref 13.0–18.0)

## 2018-08-27 MED ORDER — MONTELUKAST SODIUM 10 MG PO TABS: 10.0000 mg | ORAL_TABLET | Freq: Every day | ORAL | Status: DC

## 2018-08-27 MED ORDER — PNEUMOCOCCAL VAC POLYVALENT 25 MCG/0.5ML IJ INJ: 0.5000 mL | INJECTION | INTRAMUSCULAR | Status: DC

## 2018-08-27 MED ORDER — MORPHINE SULFATE (PF) 2 MG/ML IV SOLN
2.0000 mg | Freq: Once | INTRAVENOUS | Status: AC
  Administered 2018-08-27: 2 mg via INTRAVENOUS
  Filled 2018-08-27: qty 1

## 2018-08-27 MED ORDER — DRONABINOL 2.5 MG PO CAPS
2.5000 mg | ORAL_CAPSULE | Freq: Two times a day (BID) | ORAL | Status: DC
  Administered 2018-08-27 – 2018-08-29 (×4): 2.5 mg via ORAL
  Filled 2018-08-27 (×4): qty 1

## 2018-08-27 MED ORDER — INSULIN ASPART 100 UNIT/ML ~~LOC~~ SOLN
0.0000 [IU] | Freq: Three times a day (TID) | SUBCUTANEOUS | Status: DC
  Administered 2018-08-27: 18:00:00 3 [IU] via SUBCUTANEOUS
  Administered 2018-08-28 – 2018-08-30 (×5): 2 [IU] via SUBCUTANEOUS
  Filled 2018-08-27 (×6): qty 1

## 2018-08-27 MED ORDER — AMLODIPINE BESYLATE 5 MG PO TABS
5.0000 mg | ORAL_TABLET | Freq: Every day | ORAL | Status: DC
  Administered 2018-08-29: 08:00:00 5 mg via ORAL
  Filled 2018-08-27: qty 1

## 2018-08-27 MED ORDER — ONDANSETRON HCL 4 MG PO TABS
8.0000 mg | ORAL_TABLET | Freq: Three times a day (TID) | ORAL | Status: DC | PRN
  Administered 2018-08-29: 8 mg via ORAL
  Filled 2018-08-27: qty 2

## 2018-08-27 MED ORDER — METOPROLOL SUCCINATE ER 25 MG PO TB24
25.0000 mg | ORAL_TABLET | Freq: Every day | ORAL | Status: DC
  Administered 2018-08-29: 25 mg via ORAL
  Filled 2018-08-27: qty 1

## 2018-08-27 MED ORDER — PROCHLORPERAZINE MALEATE 10 MG PO TABS
10.0000 mg | ORAL_TABLET | Freq: Three times a day (TID) | ORAL | Status: DC | PRN
  Filled 2018-08-27: qty 1

## 2018-08-27 MED ORDER — PRAVASTATIN SODIUM 20 MG PO TABS
40.0000 mg | ORAL_TABLET | Freq: Every day | ORAL | Status: DC
  Administered 2018-08-27 – 2018-08-29 (×3): 40 mg via ORAL
  Filled 2018-08-27 (×3): qty 2

## 2018-08-27 MED ORDER — INSULIN ASPART 100 UNIT/ML ~~LOC~~ SOLN: 0.0000 [IU] | Freq: Every day | SUBCUTANEOUS | Status: DC

## 2018-08-27 MED ORDER — ACETAMINOPHEN 325 MG PO TABS
650.0000 mg | ORAL_TABLET | Freq: Four times a day (QID) | ORAL | Status: DC | PRN
  Administered 2018-08-27: 21:00:00 650 mg via ORAL
  Filled 2018-08-27: qty 2

## 2018-08-27 MED ORDER — SODIUM CHLORIDE 0.9 % IV SOLN
250.0000 mL | INTRAVENOUS | Status: DC | PRN
  Administered 2018-08-28: 14:00:00 via INTRAVENOUS

## 2018-08-27 MED ORDER — LIRAGLUTIDE 18 MG/3ML ~~LOC~~ SOPN: 1.2000 mg | PEN_INJECTOR | Freq: Every day | SUBCUTANEOUS | Status: DC

## 2018-08-27 MED ORDER — ADULT MULTIVITAMIN W/MINERALS CH
1.0000 | ORAL_TABLET | Freq: Every day | ORAL | Status: DC
  Administered 2018-08-29: 1 via ORAL
  Filled 2018-08-27: qty 1

## 2018-08-27 MED ORDER — FLUTICASONE PROPIONATE 50 MCG/ACT NA SUSP
1.0000 | Freq: Every day | NASAL | Status: DC | PRN
  Filled 2018-08-27: qty 16

## 2018-08-27 MED ORDER — SUCRALFATE 1 GM/10ML PO SUSP
1.0000 g | Freq: Three times a day (TID) | ORAL | Status: DC
  Administered 2018-08-27 – 2018-08-29 (×8): 1 g via ORAL
  Filled 2018-08-27 (×8): qty 10

## 2018-08-27 MED ORDER — OXYCODONE HCL 5 MG PO TABS: 5.0000 mg | ORAL_TABLET | ORAL | Status: DC | PRN

## 2018-08-27 MED ORDER — SODIUM CHLORIDE 0.9 % IV SOLN
Freq: Once | INTRAVENOUS | Status: AC
  Administered 2018-08-27: 12:00:00 via INTRAVENOUS
  Filled 2018-08-27: qty 250

## 2018-08-27 MED ORDER — SODIUM CHLORIDE 0.9 % IV SOLN
80.0000 mg | Freq: Once | INTRAVENOUS | Status: AC
  Administered 2018-08-27: 17:00:00 80 mg via INTRAVENOUS
  Filled 2018-08-27: qty 80

## 2018-08-27 MED ORDER — SODIUM CHLORIDE 0.9% FLUSH: 3.0000 mL | INTRAVENOUS | Status: DC | PRN

## 2018-08-27 MED ORDER — PANTOPRAZOLE SODIUM 40 MG IV SOLR: 40.0000 mg | Freq: Two times a day (BID) | INTRAVENOUS | Status: DC

## 2018-08-27 MED ORDER — POLYETHYLENE GLYCOL 3350 17 G PO PACK: 17.0000 g | PACK | Freq: Every day | ORAL | Status: DC | PRN

## 2018-08-27 MED ORDER — SODIUM CHLORIDE 0.9 % IV SOLN
8.0000 mg/h | INTRAVENOUS | Status: AC
  Administered 2018-08-27 – 2018-08-30 (×6): 8 mg/h via INTRAVENOUS
  Filled 2018-08-27 (×5): qty 80
  Filled 2018-08-27: qty 40

## 2018-08-27 MED ORDER — TRIAMCINOLONE ACETONIDE 0.1 % EX CREA: TOPICAL_CREAM | Freq: Two times a day (BID) | CUTANEOUS | Status: DC

## 2018-08-27 MED ORDER — PROMETHAZINE HCL 25 MG PO TABS
12.5000 mg | ORAL_TABLET | Freq: Four times a day (QID) | ORAL | Status: DC | PRN
  Filled 2018-08-27: qty 1

## 2018-08-27 MED ORDER — SODIUM CHLORIDE 0.9% FLUSH
3.0000 mL | Freq: Two times a day (BID) | INTRAVENOUS | Status: DC
  Administered 2018-08-27 – 2018-08-29 (×4): 3 mL via INTRAVENOUS

## 2018-08-27 MED ORDER — GI COCKTAIL ~~LOC~~
30.0000 mL | Freq: Once | ORAL | Status: AC
  Administered 2018-08-27: 30 mL via ORAL
  Filled 2018-08-27: qty 30

## 2018-08-27 NOTE — Progress Notes (Signed)
Family Meeting Note  Advance Directive:yes  Today a meeting took place with the Patient.  Patient is able to participate   The following clinical team members were present during this meeting:MD  The following were discussed:Patient's diagnosis:gib, cancer , Patient's progosis: Unable to determine and Goals for treatment: Full Code  Additional follow-up to be provided: prn  Time spent during discussion:20 minutes  Gorden Harms, MD

## 2018-08-27 NOTE — H&P (Signed)
[place] [facility] [place] NAME: Ian Shaffer    MR#:  [id number]  DATE OF BIRTH:  11/20/1946  DATE OF ADMISSION:  08/27/2018  PRIMARY CARE PHYSICIAN: Derinda Late, MD   REQUESTING/REFERRING PHYSICIAN:   CHIEF COMPLAINT:  No chief complaint on file.   HISTORY OF PRESENT ILLNESS: Ian Shaffer  is a 73 y.o. male with a known history per below presenting as a direct admission for 10-day history of black stools, patient with noted history of duodenal ulcer, patient does admit to some epigastric pain, per previous hospitalist-hemoglobin typically around 10-11, now around 8, patient is now being admitted for acute melena suspected due to peptic ulcer disease.  PAST MEDICAL HISTORY:   Past Medical History:  Diagnosis Date  . Benign essential hypertension 05/31/2014  . Cancer ([place]) 06/2017   right lung   . Cerebral infarction ([place]) 05/31/2014  . Cerebrovascular disease 05/31/2014  . CKD (chronic kidney disease) stage 3, GFR 30-59 ml/min ([facility]) 02/23/2016  . CVA (cerebral infarction)   . Diabetes mellitus without complication ([place])   . Difficult intubation   . DVT (deep venous thrombosis) ([place])   . Esophageal reflux 05/31/2014  . H/O: CVA (cerebrovascular accident)   . Hyperlipidemia   . Hypertension   . ICAO (internal carotid artery occlusion) March 01, 2014   Left  . 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 Pleural effusion 04/23/2017  . Stroke [facility]) April, 4,2015  . Type 2 diabetes mellitus with peripheral neuropathy ([place]) 02/23/2016  . Weakness     PAST SURGICAL HISTORY:  Past Surgical History:  Procedure Laterality Date  . CHEST TUBE INSERTION N/A 06/25/2017   Procedure: CZYSAY CATHETER INSERTION;  Surgeon: Nestor Lewandowsky, MD;  Location: [facility] ORS;  Service: Thoracic;  Laterality: N/A;  . CHEST TUBE INSERTION Right 08/07/2017   Procedure: REMOVAL OF PLEUR X CATHETER;  Surgeon: Nestor Lewandowsky, MD;  Location: [facility] ORS;  Service:  General;  Laterality: Right;  . NASAL SINUS SURGERY  2000  . TONSILLECTOMY    . VIDEO ASSISTED THORACOSCOPY Right 06/25/2017   Procedure: VIDEO ASSISTED THORACOSCOPY WITH TALC PLEURODESIS, CHEST TUBE INSERTION;  Surgeon: Nestor Lewandowsky, MD;  Location: [facility] ORS;  Service: Thoracic;  Laterality: Right;    SOCIAL HISTORY:  Social History   Tobacco Use  . Smoking status: Former Smoker    Types: Pipe    Last attempt to quit: 05/07/1974    Years since quitting: 44.3  . Smokeless tobacco: Never Used  . Tobacco comment: pt states he smoked a pipe a long time ago  Substance Use Topics  . Alcohol use: No    FAMILY HISTORY:  Family History  Problem Relation Age of Onset  . Hypertension Mother   . Hypertension Father     DRUG ALLERGIES:  Allergies  Allergen Reactions  . Sulfa Antibiotics Swelling    Facial swelling No tongue or lips swelling, no difficulty breathing.  . Adhesive [Tape] Itching and Rash  . Amoxicillin Nausea Only and Other (See Comments)    Has patient had a PCN reaction causing immediate rash, facial/tongue/throat swelling, SOB or lightheadedness with hypotension: No Has patient had a PCN reaction causing severe rash involving mucus membranes or skin necrosis: No Has patient had a PCN reaction that required hospitalization: No Has patient had a PCN reaction occurring within the last 10 years: Yes If all of the above answers are "NO", then may proceed with Cephalosporin use.   . Other Rash  Surgical tape    REVIEW OF SYSTEMS:   CONSTITUTIONAL: No fever, fatigue or weakness.  EYES: No blurred or double vision.  EARS, NOSE, AND THROAT: No tinnitus or ear pain.  RESPIRATORY: No cough, shortness of breath, wheezing or hemoptysis.  CARDIOVASCULAR: No chest pain, orthopnea, edema.  GASTROINTESTINAL: No nausea, vomiting, diarrhea or abdominal pain.  GENITOURINARY: No dysuria, hematuria.  ENDOCRINE: No polyuria, nocturia,  HEMATOLOGY: No anemia, easy bruising or  bleeding SKIN: No rash or lesion. MUSCULOSKELETAL: No joint pain or arthritis.   NEUROLOGIC: No tingling, numbness, weakness.  PSYCHIATRY: No anxiety or depression.   MEDICATIONS AT HOME:  Prior to Admission medications   Medication Sig Start Date End Date Taking? Authorizing Provider  acetaminophen (TYLENOL) 325 MG tablet Take 2 tablets (650 mg total) by mouth every 6 (six) hours as needed for mild pain (or Fever >/= 101). 05/22/18   Loletha Grayer, MD  amLODipine (NORVASC) 5 MG tablet Take 1 tablet by mouth daily. 05/27/18   [provider]  aspirin EC 81 MG tablet Take 81 mg by mouth daily.    [provider]  clopidogrel (PLAVIX) 75 MG tablet Take 75 mg by mouth daily.    [provider]  dronabinol (MARINOL) 2.5 MG capsule Take 1 capsule (2.5 mg total) by mouth 2 (two) times daily before a meal. 08/25/18   Verlon Au, NP  fluticasone (FLONASE) 50 MCG/ACT nasal spray Place 1 spray into both nostrils daily at 2 PM.     [provider]  liraglutide (VICTOZA) 18 MG/3ML SOPN Inject 1.2 mg into the skin daily.    [provider]  metoprolol succinate (TOPROL-XL) 25 MG 24 hr tablet Take 25 mg by mouth daily.    [provider]  montelukast (SINGULAIR) 10 MG tablet Take 1 tablet (10 mg total) by mouth at bedtime. Take 1 tablet-10 mg night prior x 3 days. 06/02/18   Cammie Sickle, MD  Multiple Vitamins-Minerals (CENTRUM SILVER PO) Take 1 tablet by mouth daily.    [provider]  ondansetron (ZOFRAN) 8 MG tablet Take 8 mg by mouth every 8 (eight) hours as needed for nausea or refractory nausea / vomiting.  07/31/17   [provider]  oxyCODONE (OXY IR/ROXICODONE) 5 MG immediate release tablet Take 1 tablet (5 mg total) by mouth every 4 (four) hours as needed for severe pain. 08/11/18   Verlon Au, NP  polyethylene glycol powder (GLYCOLAX/MIRALAX) powder Take 17 g by mouth daily as needed (for constipation.).     [provider]  pravastatin (PRAVACHOL) 40 MG tablet Take 40 mg by mouth daily.    [provider]  prochlorperazine (COMPAZINE) 10 MG tablet Take 10 mg by mouth every 8 (eight) hours as needed for vomiting.  07/31/17   [provider]  triamcinolone cream (KENALOG) 0.1 % Apply topically 2 (two) times daily. 05/22/18   Loletha Grayer, MD      PHYSICAL EXAMINATION:   VITAL SIGNS: Blood pressure 128/82, pulse 85, temperature 98.2 F (36.8 C), temperature source Oral, SpO2 99 %.  GENERAL:  72 y.o.-year-old patient lying in the bed with no acute distress.  EYES: Pupils equal, round, reactive to light and accommodation. No scleral icterus. Extraocular muscles intact.  HEENT: Head atraumatic, normocephalic. Oropharynx and nasopharynx clear.  NECK:  Supple, no jugular venous distention. No thyroid enlargement, no tenderness.  LUNGS: Normal breath sounds bilaterally, no wheezing, rales,rhonchi or crepitation. No use of accessory muscles of respiration.  CARDIOVASCULAR:  S1, S2 normal. No murmurs, rubs, or gallops.  ABDOMEN: Soft, nontender, nondistended. Bowel sounds present. No organomegaly or mass.  EXTREMITIES: No pedal edema, cyanosis, or clubbing.  NEUROLOGIC: Cranial nerves II through XII are intact. Muscle strength 5/5 in all extremities. Sensation intact. Gait not checked.  PSYCHIATRIC: The patient is alert and oriented x 3.  SKIN: No obvious rash, lesion, or ulcer.   LABORATORY PANEL:   CBC Recent Labs  Lab 08/25/18 1309 08/27/18 1106 08/27/18 1238  WBC 13.3*  --  13.4*  HGB 9.0* 8.8* 8.9*  HCT 26.9* 26.7* 26.9*  PLT 350  --  364  MCV 88.6  --  89.3  MCH 29.6  --  29.6  MCHC 33.3  --  33.1  RDW 18.9*  --  18.5*  LYMPHSABS 0.9*  --  1.0  MONOABS 0.8  --  1.0  EOSABS 0.1  --  0.1  BASOSABS 0.1  --  0.1   ------------------------------------------------------------------------------------------------------------------  Chemistries  Recent Labs   Lab 08/25/18 1309 08/27/18 1238  NA 139 139  K 4.4 4.5  CL 103 103  CO2 25 25  GLUCOSE 176* 129*  BUN 22 26*  CREATININE 1.38* 1.35*  CALCIUM 9.6 9.8  AST 31 37  ALT 22 22  ALKPHOS 294* 300*  BILITOT 0.2* 0.5   ------------------------------------------------------------------------------------------------------------------ estimated creatinine clearance is 49.6 mL/min (A) (by C-G formula based on SCr of 1.35 mg/dL (H)). ------------------------------------------------------------------------------------------------------------------ No results for input(s): TSH, T4TOTAL, T3FREE, THYROIDAB in the last 72 hours.  Invalid input(s): FREET3   Coagulation profile No results for input(s): INR, PROTIME in the last 168 hours. ------------------------------------------------------------------------------------------------------------------- No results for input(s): DDIMER in the last 72 hours. -------------------------------------------------------------------------------------------------------------------  Cardiac Enzymes No results for input(s): CKMB, TROPONINI, MYOGLOBIN in the last 168 hours.  Invalid input(s): CK ------------------------------------------------------------------------------------------------------------------ Invalid input(s): POCBNP  ---------------------------------------------------------------------------------------------------------------  Urinalysis    Component Value Date/Time   COLORURINE AMBER (A) 08/11/2018 1007   APPEARANCEUR TURBID (A) 08/11/2018 1007   LABSPEC 1.034 (H) 08/11/2018 1007   PHURINE 5.0 08/11/2018 1007   GLUCOSEU NEGATIVE 08/11/2018 1007   HGBUR NEGATIVE 08/11/2018 1007   BILIRUBINUR NEGATIVE 08/11/2018 1007   KETONESUR 5 (A) 08/11/2018 1007   PROTEINUR 100 (A) 08/11/2018 1007   NITRITE NEGATIVE 08/11/2018 1007   LEUKOCYTESUR NEGATIVE 08/11/2018 1007     RADIOLOGY: No results found.  EKG: Orders placed or performed  during the hospital encounter of 05/20/18  . ED EKG 12-Lead  . ED EKG 12-Lead  . EKG    IMPRESSION AND PLAN: *Acute melena Noted history of duodenal ulcer, positive epigastric pain, accepted as a direct admission Admit to regular nursing for bed on our GI bleeding protocol, Protonix drip, gastroenterology to see, clear liquid diet, n.p.o. at midnight, avoid NSAIDs/antiplatelet agents/anticoagulants, H&H every 6 hours, CBC daily, transfuse as needed, check admission CMP/CBC/INR, and continue close medical monitoring  *Chronic diabetes mellitus type 2 Sliding scale insulin with Accu-Cheks per routine  *Chronic benign essential hypertension Stable Continue home regiment  *Chronic hyperlipidemia, unspecified Continue statin therapy, check lipids in the morning  *History of CVA Hold DAPT, continue statin therapy  *History of mesothelioma Will need to follow-up with oncology for continued management status post discharge  All the records are reviewed and case discussed with ED provider. Management plans discussed with the patient, family and they are in agreement.  CODE STATUS:full Code Status History    Date Active Date Inactive Code Status Order ID Comments User Context   05/21/2018 0016 05/22/2018  [place] Full Code [id number]  Lance Coon, MD ED   05/07/2018 2248 05/12/2018 2006 Full Code [id number]  Harrie Foreman, MD Inpatient   09/09/2017 0242 09/09/2017 1722 Full Code [id number]  Demetrios Loll, MD Inpatient   06/25/2017 1347 06/27/2017 1659 Full Code [id number]  Nestor Lewandowsky, MD Inpatient   04/23/2017 0134 04/23/2017 1657 Full Code [id number]  Hugelmeyer, Ubaldo Glassing, DO Inpatient    Advance Directive Documentation     Most Recent Value  Type of Advance Directive  Living will, Healthcare Power of Attorney  Pre-existing out of facility DNR order (yellow form or pink MOST form)  -  "MOST" Form in Place?  -       TOTAL TIME TAKING CARE OF THIS PATIENT: 45 minutes.    Avel Peace Salary  M.D on 08/27/2018   Between 7am to 6pm - Pager - [phone]  After 6pm go to [email or web address] - password [email or web address] [facility]  Office  [phone]  CC: Primary care physician; Derinda Late, MD   Note: This dictation was prepared with Dragon dictation along with smaller phrase technology. Any transcriptional errors that result from this process are unintentional.

## 2018-08-27 NOTE — Progress Notes (Signed)
[facility]  [phone] [phone]  Patient Care Team: Derinda Late, MD as PCP - General (Family Medicine) Margaretha Sheffield, MD (Otolaryngology) Telford Nab, RN as Registered Nurse Nestor Lewandowsky, MD as Referring Physician (Cardiothoracic Surgery) Cammie Sickle, MD as Consulting Physician (Internal Medicine)   Name of the patient: Ian Shaffer  [id number]  03/20/1946   Date of visit: 08/27/18  Diagnosis-malignant mesothelioma  Chief complaint/ Reason for visit- Melena  Heme/Onc history:  Oncology History   # July-AUG 2018- right pleural based mass ~10 cm [ above & below diaphragm]; s/p VATS- Dr.Oaks-Bx- 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   # JUNE 2019- GEM; SEP 9th CT scan- Progression  # Hx of CVA Right hand; no residual deficits [left sided carotid block; no surgery done]- on Asa/plavix; CKD- stage III; DM-2; ? PN  # MOLECULAR TESTING- PDL-1- 1% [LOW]; [facility]- No actionable**  ----------------------------------------------------   DIAGNOSIS: [AUG 2019 ] ? [name]   STAGE:  IV  ;GOALS: Palliative  CURRENT/MOST RECENT THERAPY- June 2019- GEM     [name], malignant ([place])   08/11/2018 -  Chemotherapy    The patient had vinorelbine (NAVELBINE) 60 mg in sodium chloride 0.9 % 50 mL chemo  infusion, 60 mg (original dose ), Intravenous,  Once, 0 of 4 cycles Dose modification: 30 mg/m2 (Cycle 1, Reason: Provider Judgment), 60 mg (Cycle 1, Reason: Provider Judgment)  for chemotherapy treatment.      Interval history- Ian Shaffer, 72 year old male, [facility] follow-up for melena.  He was previously seen in clinic on 08/25/18 with report of 2 weeks of black stools with early satiety and upper abdominal pain. Dr. Baldemar Lenis was consulted and gave ok to hold plavix, continue aspirin for prior cva in 2015. Protonix was prescribed but he has not yet started. He has chronic pain associated with malignancy was has been reluctant to take narcotics due to concern of addiction but has had some relief with CBD oil. Has taken oxycodone 38m PO with some improvement in pain. Marinol was added to his regimen and prior auth was submitted; he has not yet started. Fourth line vinorelbine was previously discussed with Dr. BRogue Bussingbut patient felt too weak to proceed. Today he reports continued weakness and says he feels 'terrible'. Has not had any bowel movements. Has continued nausea, no vomiting. Continued right upper quadrant pain, improved with oxycodone but he is reluctant to take. States he has little appetite. Wife and sister in law who accompany him today express concern for his condition and feel he is took weak for them to care for him at home. He is now requiring assistance with his daily care.   ECOG FS:3 - Symptomatic, >50% confined to bed  Review of systems- Review of Systems  Constitutional: Positive for malaise/fatigue and weight loss. Negative for chills and fever.  HENT: Negative for congestion, ear discharge, ear pain, sinus pain, sore throat and tinnitus.  Eyes: Negative.   Respiratory: Negative.  Negative for cough, sputum production and shortness of breath.   Cardiovascular: Negative for chest pain, palpitations, orthopnea, claudication and leg  swelling.  Gastrointestinal: Positive for abdominal pain, constipation, melena and nausea (intermittent, mild. early satiety.). Negative for blood in stool, diarrhea, heartburn and vomiting.  Genitourinary: Negative.   Musculoskeletal: Negative.   Skin: Negative.   Neurological: Positive for dizziness and weakness. Negative for tingling, loss of consciousness and headaches.  Endo/Heme/Allergies: Negative.   Psychiatric/Behavioral: Positive for depression. The patient has insomnia (poor sleep w/ daytime drowsiness).      Current treatment- s/p gemcitabine; considering initiation of vinorelbine  Allergies  Allergen Reactions  . Sulfa Antibiotics Swelling    Facial swelling No tongue or lips swelling, no difficulty breathing.  . Adhesive [Tape] Itching and Rash  . Amoxicillin Nausea Only and Other (See Comments)    Has patient had a PCN reaction causing immediate rash, facial/tongue/throat swelling, SOB or lightheadedness with hypotension: No Has patient had a PCN reaction causing severe rash involving mucus membranes or skin necrosis: No Has patient had a PCN reaction that required hospitalization: No Has patient had a PCN reaction occurring within the last 10 years: Yes If all of the above answers are "NO", then may proceed with Cephalosporin use.   . Other Rash    Surgical tape    Past Medical History:  Diagnosis Date  . Benign essential hypertension 05/31/2014  . Cancer ([place]) 06/2017   right lung   . Cerebral infarction ([place]) 05/31/2014  . Cerebrovascular disease 05/31/2014  . CKD (chronic kidney disease) stage 3, GFR 30-59 ml/min (HCC) 02/23/2016  . CVA (cerebral infarction)   . Diabetes mellitus without complication ([place])   . Difficult intubation   . DVT (deep venous thrombosis) ([place])   . Esophageal reflux 05/31/2014  . H/O: CVA (cerebrovascular accident)   . Hyperlipidemia   . Hypertension   . ICAO (internal carotid artery occlusion) March 01, 2014   Left  . Localized,  primary osteoarthritis of shoulder region 05/09/2017  . Pleural effusion 04/23/2017  . Stroke [facility]) April, 4,2015  . Type 2 diabetes mellitus with peripheral neuropathy ([place]) 02/23/2016  . Weakness     Past Surgical History:  Procedure Laterality Date  . CHEST TUBE INSERTION N/A 06/25/2017   Procedure: MOLMBE CATHETER INSERTION;  Surgeon: Nestor Lewandowsky, MD;  Location: [facility] ORS;  Service: Thoracic;  Laterality: N/A;  . CHEST TUBE INSERTION Right 08/07/2017   Procedure: REMOVAL OF PLEUR X CATHETER;  Surgeon: Nestor Lewandowsky, MD;  Location: [facility] ORS;  Service: General;  Laterality: Right;  . NASAL SINUS SURGERY  2000  . TONSILLECTOMY    . VIDEO ASSISTED THORACOSCOPY Right 06/25/2017   Procedure: VIDEO ASSISTED THORACOSCOPY WITH TALC PLEURODESIS, CHEST TUBE INSERTION;  Surgeon: Nestor Lewandowsky, MD;  Location: [facility] ORS;  Service: Thoracic;  Laterality: Right;    Social History   Socioeconomic History  . Marital status: Married    Spouse name: Vaughan Basta  . Number of children: Not on file  . Years of education: Not on file  . Highest education level: Not on file  Occupational History  . Not on file  Social Needs  . Financial resource strain: Not hard at all  . Food insecurity:    Worry: Patient refused    Inability: Patient refused  . Transportation needs:    Medical: Patient refused    Non-medical: Patient refused  Tobacco Use  . Smoking status: Former Smoker  Types: Pipe    Last attempt to quit: 05/07/1974    Years since quitting: 44.3  . Smokeless tobacco: Never Used  . Tobacco comment: pt states he smoked a pipe a long time ago  Substance and Sexual Activity  . Alcohol use: No  . Drug use: No  . Sexual activity: Not Currently  Lifestyle  . Physical activity:    Days per week: Patient refused    Minutes per session: Patient refused  . Stress: Only a little  Relationships  . Social connections:    Talks on phone: Patient refused    Gets together: Patient refused    Attends  religious service: Patient refused    Active member of club or organization: Patient refused    Attends meetings of clubs or organizations: Patient refused    Relationship status: Patient refused  . Intimate partner violence:    Fear of current or ex partner: Patient refused    Emotionally abused: Patient refused    Physically abused: Patient refused    Forced sexual activity: Patient refused  Other Topics Concern  . Not on file  Social History Narrative  . Not on file    Family History  Problem Relation Age of Onset  . Hypertension Mother   . Hypertension Father     Current Outpatient Medications:  .  acetaminophen (TYLENOL) 325 MG tablet, Take 2 tablets (650 mg total) by mouth every 6 (six) hours as needed for mild pain (or Fever >/= 101)., Disp: , Rfl:  .  amLODipine (NORVASC) 5 MG tablet, Take 1 tablet by mouth daily., Disp: , Rfl:  .  aspirin EC 81 MG tablet, Take 81 mg by mouth daily., Disp: , Rfl:  .  dronabinol (MARINOL) 2.5 MG capsule, Take 1 capsule (2.5 mg total) by mouth 2 (two) times daily before a meal., Disp: 60 capsule, Rfl: 0 .  fluticasone (FLONASE) 50 MCG/ACT nasal spray, Place 1 spray into both nostrils daily at 2 PM. , Disp: , Rfl:  .  liraglutide (VICTOZA) 18 MG/3ML SOPN, Inject 1.2 mg into the skin daily., Disp: , Rfl:  .  metoprolol succinate (TOPROL-XL) 25 MG 24 hr tablet, Take 25 mg by mouth daily., Disp: , Rfl:  .  montelukast (SINGULAIR) 10 MG tablet, Take 1 tablet (10 mg total) by mouth at bedtime. Take 1 tablet-10 mg night prior x 3 days., Disp: 40 tablet, Rfl: 3 .  Multiple Vitamins-Minerals (CENTRUM SILVER PO), Take 1 tablet by mouth daily., Disp: , Rfl:  .  ondansetron (ZOFRAN) 8 MG tablet, Take 8 mg by mouth every 8 (eight) hours as needed for nausea or refractory nausea / vomiting. , Disp: , Rfl:  .  oxyCODONE (OXY IR/ROXICODONE) 5 MG immediate release tablet, Take 1 tablet (5 mg total) by mouth every 4 (four) hours as needed for severe pain., Disp:  30 tablet, Rfl: 0 .  pravastatin (PRAVACHOL) 40 MG tablet, Take 40 mg by mouth daily., Disp: , Rfl:  .  prochlorperazine (COMPAZINE) 10 MG tablet, Take 10 mg by mouth every 8 (eight) hours as needed for vomiting. , Disp: , Rfl:  .  triamcinolone cream (KENALOG) 0.1 %, Apply topically 2 (two) times daily., Disp: 30 g, Rfl: 0 .  clopidogrel (PLAVIX) 75 MG tablet, Take 75 mg by mouth daily., Disp: , Rfl:  .  lansoprazole (PREVACID) 30 MG capsule, TAKE 1 CAPSULE EVERY DAY BEFORE BREAKFAST (Patient not taking: Reported on 08/27/2018), Disp: 30 capsule, Rfl: 4 .  pantoprazole (PROTONIX)  20 MG tablet, Take 1 tablet (20 mg total) by mouth 2 (two) times daily. (Patient not taking: Reported on 08/27/2018), Disp: 60 tablet, Rfl: 0 .  polyethylene glycol powder (GLYCOLAX/MIRALAX) powder, Take 17 g by mouth daily as needed (for constipation.)., Disp: , Rfl:  No current facility-administered medications for this visit.   Facility-Administered Medications Ordered in Other Visits:  .  morphine 2 MG/ML injection 2 mg, 2 mg, Intravenous, Once, Verlon Au, NP  Physical exam:  Vitals:   08/27/18 1128  BP: 137/86  Pulse: 77  Resp: 20  Temp: 98.8 F (37.1 C)  TempSrc: Tympanic  SpO2: 97%   Physical Exam  Constitutional: He is oriented to person, place, and time. He appears lethargic. He has a sickly appearance. No distress.  Appears in pain. Accompanied.  HENT:  Head: Normocephalic and atraumatic.  Nose: Nose normal.  Mouth/Throat: Oropharynx is clear and moist. No oropharyngeal exudate.  Eyes: Pupils are equal, round, and reactive to light. Conjunctivae and EOM are normal. No scleral icterus.  Neck: Normal range of motion. Neck supple.  Cardiovascular: Normal rate, regular rhythm and normal heart sounds.  Pulmonary/Chest: Effort normal. No respiratory distress. He has decreased breath sounds. He has no wheezes.  Abdominal: Soft. Bowel sounds are normal. He exhibits no distension. There is tenderness  in the right upper quadrant. There is no rebound.  Musculoskeletal: Normal range of motion. He exhibits no edema.  Neurological: He is oriented to person, place, and time. He appears lethargic.  Skin: Skin is warm and dry.  Psychiatric: He is withdrawn. He exhibits a depressed mood.     CMP Latest Ref Rng & Units 08/25/2018  Glucose 70 - 99 mg/dL 176(H)  BUN 8 - 23 mg/dL 22  Creatinine 0.61 - 1.24 mg/dL 1.38(H)  Sodium 135 - 145 mmol/L 139  Potassium 3.5 - 5.1 mmol/L 4.4  Chloride 98 - 111 mmol/L 103  CO2 22 - 32 mmol/L 25  Calcium 8.9 - 10.3 mg/dL 9.6  Total Protein 6.5 - 8.1 g/dL 6.9  Total Bilirubin 0.3 - 1.2 mg/dL 0.2(L)  Alkaline Phos 38 - 126 U/L 294(H)  AST 15 - 41 U/L 31  ALT 0 - 44 U/L 22   CBC Latest Ref Rng & Units 08/27/2018  WBC 3.8 - 10.6 K/uL -  Hemoglobin 13.0 - 18.0 g/dL 8.8(L)  Hematocrit 40.0 - 52.0 % 26.7(L)  Platelets 150 - 440 K/uL -    No images are attached to the encounter.  Dg Chest 2 View  Result Date: 08/11/2018 CLINICAL DATA:  History of mesothelioma, chest pain and fever EXAM: CHEST - 2 VIEW COMPARISON:  CT chest of 08/03/2018 and chest x-ray of 05/20/2017 FINDINGS: Multiple lung nodules again are noted throughout the lungs consistent with diffuse metastatic involvement of the lungs. The largest nodule at the left lung base currently measures 36 mm in diameter compared to prior measurement of approximately 30 mm on recent CT. This may be some discrepancy in matter measurement, but the lesions have certainly not diminished in size. A tiny right pleural effusion cannot be excluded. Mediastinal and hilar contours are unchanged and heart size is stable. No bony abnormality is noted other than degenerative change throughout the thoracic spine. IMPRESSION: Diffuse lung metastases again are noted possibly slightly larger. No pneumonia is seen. A tiny right pleural effusion cannot be excluded Electronically Signed   By: Ivar Drape M.D.   On: 08/11/2018 10:00    Ct Chest Wo Contrast  Result Date:  08/03/2018 CLINICAL DATA:  History of mesothelioma diagnosed in August of 2018. Follow-up imaging. EXAM: CT CHEST WITHOUT CONTRAST TECHNIQUE: Multidetector CT imaging of the chest was performed following the standard protocol without IV contrast. COMPARISON:  05/06/2018 FINDINGS: Cardiovascular: The heart size appears within normal limits. No pericardial effusion. Aortic atherosclerosis. Calcification in the RCA, LAD and left circumflex coronary arteries. Mediastinum/Nodes: Normal appearance of the thyroid gland. The trachea appears patent and is midline. Normal appearance of the esophagus. No axillary or supraclavicular adenopathy. No mediastinal adenopathy identified. Lungs/Pleura: No pleural effusion. Again seen is pleural thickening overlying the posterior right lower lobe. The pleural base lung mass within the posterior right lower lobe measures 4.3 by 3.0 cm, image 117/3. Previously 4.9 x 2.7 cm. Multiple pulmonary parenchymal nodules are identified in both lungs compatible with metastatic disease. The index lesion within the right middle lobe measures 3.4 cm, image 100/4. Previously 1.0 cm. Index lesion within the left lower lobe measures 2.9 cm, image 89/4. Previously 1.5 cm. New lesion within the left upper lobe measures 1.2 cm, image 55/4. Anterior right upper lobe nodule measures 0.9 cm, image number 68/4. Previously 0.7 cm. Upper Abdomen: The large mass involving the posterior right lobe of liver measures 12.6 x 10.3 cm, image 24/3. Previously 11.0 x 7.9 cm. Soft tissue nodularity along the surface of the posterior liver has progressed. Dominant lesion measures 5.2 cm versus 4.9 cm previously. Adjacent nodule measures 2.2 cm, image 149/3. Previously 1.1 cm. Musculoskeletal: No chest wall mass or suspicious bone lesions identified. IMPRESSION: 1. Overall interval progression of disease. Although the primary lesion within the posterior right hemithorax is not  significantly changed, bilateral pulmonary metastasis have increased in size and number when compared with previous exam. The mass involving the posterior right lobe of liver has also increased in size in the interval. Two subcapsular lesions along the posterior aspect of the right lobe of liver have increased in the interval. 2.  Aortic Atherosclerosis (ICD10-I70.0). 3. Multi vessel coronary artery atherosclerotic calcifications. Electronically Signed   By: Kerby Moors M.D.   On: 08/03/2018 14:15   US Abdomen Limited Ruq  Result Date: 08/11/2018 CLINICAL DATA:  Upper abdominal pain. History of malignant mesothelioma EXAM: ULTRASOUND ABDOMEN LIMITED RIGHT UPPER QUADRANT COMPARISON:  PET-CT July 09, 2017. Chest CT including portions of the upper abdomen August 03, 2018 FINDINGS: Gallbladder: No gallstones or wall thickening visualized. There is no pericholecystic fluid. No sonographic Murphy sign noted by sonographer. Common bile duct: Diameter: 3 mm. No intrahepatic or extrahepatic biliary duct dilatation. Liver: There are mass lesions in the liver, felt to represent metastatic foci. The largest of these lesions is seen in the anterior segment right lobe of the liver measuring 14.8 x 8.5 x 13.8 cm. A second lesion in the right lobe measures 6.8 x 3.8 x 5.8 cm. A third lesion arising eccentrically from the right lobe of the liver measures 2.2 x 2.0 x 1.9 cm. Within normal limits in parenchymal echogenicity. Portal vein is patent on color Doppler imaging with normal direction of blood flow towards the liver. Right kidney appears somewhat echogenic. IMPRESSION: 1. Widespread metastatic disease throughout the right lobe of the liver. The larger lesions have a somewhat infiltrative appearance. 2. Right kidney appears somewhat echogenic. Question a degree of medical renal disease. 3. No demonstrable gallbladder pathology or biliary duct dilatation. Electronically Signed   By: Lowella Grip III M.D.   On:  08/11/2018 13:35    Assessment and plan- Patient is a  72 y.o. male diagnosed with malignant mesothelioma who presents to symptom management clinic for melena & symptomatic anemia.   1.  Malignant mesothelioma-metastatic s/p 4 cycles of third line gemcitabine.  Imaging on 07/04/2018 showed progression of lung nodules, stable intrathoracic lesion, worsening liver mass.  Unfortunately no clinical trials available at [facility].  Discussed vinorelbine as next line of therapy given with palliative intent.  Due to his acute melena & symptoms, initiation of vinorelbine on hold.   2.  Melena-new onset melena w/ history of duodenal ulcers in 1980s. H&H continues to drop and increasingly symptomatic (see below). Plavix currently held (see below) and was started on Protonix 20 mg BID (patient has not picked up to start). Had referred to Dr. Vira Agar at patient request but now given worsening symptoms and labs recommend hospitalization for further evaluation and EGD. Discussed with Dr. Rogue Bussing who agrees with recommendation.   3. Orthostasis- likely r/t poor oral intake and pain. IV fluids initiated in clinic but recommend close monitoring of labs given underlying gi bleed.    4. Symptomatic anemia. Hmg down trending. 8.8 today. Orthostatic. May require transfusion if hmg < 8. Continue to monitor.   5. Pain associated with neoplasm- RUQ abdominal pain consistent with increased liver involvement but ulcer may also be contributing. On oxycodone 86m PO and marinol 2.553mBID at home. Morphine 53m76mV given in clinic with improvement. Would benefit from continued pain management and medication adjustment in hospital.   6. History of CVA- history of cva in 2015. On plavix and aspirin 89m43miscussed with Dr. BabaBaldemar Lenis ok'd holding of plavix previously. Continue to hold plavix.  Continue statin.  7.  Type 2 diabetes-currently on Victoza. Last hA1c 8.9 (05/07/18). Continue to monitor.   8.  Fatigue-likely combination of  underlying malignancy, progression, acute symptoms, impaired sleep/wake cycle  May consider palliative treatments in the future.   7. Goals of Care-  Overall prognosis is poor. Patient wishes to proceed with fourth line treatment should his acute condition improve and declines hospice at this time. He is however interested in palliative care services. Recommend patient meet with palliative care team while inpatient and consideration of outpatient/home based services.  History and findings discussed with Dr. BrahRogue Bussing recommends hospital admission. Discussed with Dr. SudiDarvin Neighbours accepts for admission.    Visit Diagnosis 1. [name], malignant (HCC)[place]2. Melena   3. Orthostasis   4. Symptomatic anemia   5. Neoplasm related pain   6. History of CVA in adulthood   7. Type 2 diabetes mellitus with peripheral neuropathy (HCC)   8. Neoplastic (malignant) related fatigue   9. Goals of care, counseling/discussion     Patient expressed understanding and was in agreement with this plan. He also understands that He can call clinic at any time with any questions, concerns, or complaints.   Thank you for allowing me to participate in the care of this very pleasant patient.   LaurBeckey RutterP, AGNP-C [facility]-[phone] cell) [phone])

## 2018-08-28 ENCOUNTER — Encounter: Payer: Self-pay | Admitting: Anesthesiology

## 2018-08-28 ENCOUNTER — Encounter
Admission: AD | Disposition: A | Discharge status: Home / Self Care | Payer: Self-pay | Source: Ambulatory Visit | Attending: Specialist

## 2018-08-28 ENCOUNTER — Inpatient Hospital Stay: Payer: Medicare Other | Admitting: Registered Nurse

## 2018-08-28 ENCOUNTER — Inpatient Hospital Stay: Payer: Medicare Other

## 2018-08-28 DIAGNOSIS — K317 Polyp of stomach and duodenum: Secondary | ICD-10-CM

## 2018-08-28 DIAGNOSIS — E43 Unspecified severe protein-calorie malnutrition: Secondary | ICD-10-CM

## 2018-08-28 DIAGNOSIS — K921 Melena: Principal | ICD-10-CM

## 2018-08-28 HISTORY — PX: ESOPHAGOGASTRODUODENOSCOPY: SHX5428

## 2018-08-28 LAB — CBC
HEMATOCRIT: 23.7 % — AB (ref 40.0–52.0)
HEMOGLOBIN: 8 g/dL — AB (ref 13.0–18.0)
MCH: 29.9 pg (ref 26.0–34.0)
MCHC: 33.6 g/dL (ref 32.0–36.0)
MCV: 89.1 fL (ref 80.0–100.0)
Platelets: 316 10*3/uL (ref 150–440)
RBC: 2.66 MIL/uL — ABNORMAL LOW (ref 4.40–5.90)
RDW: 18.6 % — ABNORMAL HIGH (ref 11.5–14.5)
WBC: 13.1 10*3/uL — AB (ref 3.8–10.6)

## 2018-08-28 LAB — GLUCOSE, CAPILLARY
GLUCOSE-CAPILLARY: 111 mg/dL — AB (ref 70–99)
GLUCOSE-CAPILLARY: 127 mg/dL — AB (ref 70–99)
GLUCOSE-CAPILLARY: 206 mg/dL — AB (ref 70–99)
GLUCOSE-CAPILLARY: 184 mg/dL — AB (ref 70–99)
Glucose-Capillary: 110 mg/dL — ABNORMAL HIGH (ref 70–99)

## 2018-08-28 LAB — URINALYSIS, COMPLETE (UACMP) WITH MICROSCOPIC
BACTERIA UA: NONE SEEN
Bilirubin Urine: NEGATIVE
Glucose, UA: NEGATIVE mg/dL
Hgb urine dipstick: NEGATIVE
KETONES UR: NEGATIVE mg/dL
Leukocytes, UA: NEGATIVE
NITRITE: NEGATIVE
PROTEIN: 30 mg/dL — AB
Specific Gravity, Urine: 1.017 (ref 1.005–1.030)
Squamous Epithelial / LPF: NONE SEEN (ref 0–5)
pH: 5 (ref 5.0–8.0)

## 2018-08-28 LAB — BASIC METABOLIC PANEL
ANION GAP: 10 (ref 5–15)
BUN: 23 mg/dL (ref 8–23)
CALCIUM: 8.9 mg/dL (ref 8.9–10.3)
CO2: 26 mmol/L (ref 22–32)
Chloride: 105 mmol/L (ref 98–111)
Creatinine, Ser: 1.44 mg/dL — ABNORMAL HIGH (ref 0.61–1.24)
GFR calc Af Amer: 54 mL/min — ABNORMAL LOW (ref >60)
GFR, EST NON AFRICAN AMERICAN: 46 mL/min — AB (ref >60)
Glucose, Bld: 135 mg/dL — ABNORMAL HIGH (ref 70–99)
POTASSIUM: 4.7 mmol/L (ref 3.5–5.1)
Sodium: 141 mmol/L (ref 135–145)

## 2018-08-28 LAB — HEMOGLOBIN: HEMOGLOBIN: 7.8 g/dL — AB (ref 13.0–18.0)

## 2018-08-28 SURGERY — EGD (ESOPHAGOGASTRODUODENOSCOPY)
Anesthesia: General

## 2018-08-28 MED ORDER — FERROUS SULFATE 325 (65 FE) MG PO TABS
325.0000 mg | ORAL_TABLET | Freq: Two times a day (BID) | ORAL | Status: DC
  Administered 2018-08-28 – 2018-08-29 (×3): 325 mg via ORAL
  Filled 2018-08-28 (×3): qty 1

## 2018-08-28 MED ORDER — PROPOFOL 10 MG/ML IV BOLUS
INTRAVENOUS | Status: DC | PRN
  Administered 2018-08-28: 90 mg via INTRAVENOUS

## 2018-08-28 MED ORDER — PEG 3350-KCL-NA BICARB-NACL 420 G PO SOLR
4000.0000 mL | Freq: Once | ORAL | Status: AC
  Administered 2018-08-28: 4000 mL via ORAL
  Filled 2018-08-28: qty 4000

## 2018-08-28 MED ORDER — PROPOFOL 500 MG/50ML IV EMUL
INTRAVENOUS | Status: DC | PRN
  Administered 2018-08-28: 150 ug/kg/min via INTRAVENOUS

## 2018-08-28 MED ORDER — SODIUM CHLORIDE 0.9 % IV SOLN
1.0000 g | INTRAVENOUS | Status: DC
  Administered 2018-08-28 – 2018-08-29 (×2): 1 g via INTRAVENOUS
  Filled 2018-08-28 (×2): qty 1
  Filled 2018-08-28: qty 10

## 2018-08-28 MED ORDER — OXYCODONE HCL 5 MG PO TABS
5.0000 mg | ORAL_TABLET | ORAL | Status: DC | PRN
  Administered 2018-08-28: 21:00:00 5 mg via ORAL
  Filled 2018-08-28: qty 1

## 2018-08-28 MED ORDER — LIDOCAINE HCL (CARDIAC) PF 100 MG/5ML IV SOSY
PREFILLED_SYRINGE | INTRAVENOUS | Status: DC | PRN
  Administered 2018-08-28: 40 mg via INTRAVENOUS

## 2018-08-28 MED ORDER — SODIUM CHLORIDE 0.9 % IV SOLN: INTRAVENOUS | Status: DC

## 2018-08-28 NOTE — Progress Notes (Signed)
Initial Nutrition Assessment  DOCUMENTATION CODES:   Severe malnutrition in context of chronic illness  INTERVENTION:  Once diet able to be advanced, provide Premier Protein po TID, each supplement provides 160 kcal and 30 grams of protein.  Continue MVI daily with diet advancement.  NUTRITION DIAGNOSIS:   Severe Malnutrition related to chronic illness(stage IV mesothelioma) as evidenced by moderate-severe fat depletion, moderate-severe muscle depletion, 22% weight loss over 1 year.  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   Met with patient at bedside. He reports he is NPO for a possible procedure today but he is ready to eat. He was diagnosed with cancer in August 2018 and since then he has had a decreasing appetite and intake. He still eats 3 meals per day. He is only able to report that he eats smaller amounts at meals than he used to and is not able to provide any further details. He drinks Glucerna but only 1-2 times per week. He thinks Glucerna is too sweet. He is amenable to trying Premier Protein once diet is able to be advanced. He reports he is very weak now and can only tolerate standing for a short amount of time. RD did note significant muscle wasting on lower extremities.   Patient reports his UBW was 230 lbs (104.5 kg) prior to his diagnosis. Per chart patient was 205.2 lbs on 10/08/2017. Patient was 81.5 kg on 08/25/2018 (179.3 lbs). He has lost 23 kg (22% body weight) over the past year, which is significant for  time frame. Unsure if current weight of 85 kg is accurate.  Medications reviewed and include: Marinol 2.5 mg BID, Novolog 0-15 units TID, Novolog 0-5 units QHS, MVI daily, pantoprazole, Carafate 1 gram TID and QHS, pantoprazole gtt.  Labs reviewed: CBG 110-155, Creatinine 1.44.  NUTRITION - FOCUSED PHYSICAL EXAM:    Most Recent Value  Orbital Region  Severe depletion  Upper Arm Region  Moderate depletion  Thoracic and Lumbar Region  Moderate depletion  Buccal Region  Severe depletion  Temple Region  Severe depletion  Clavicle Bone Region  Moderate depletion  Clavicle and Acromion Bone Region  Moderate depletion  Scapular Bone Region  Moderate depletion  Dorsal Hand  Moderate depletion  Patellar Region  Severe depletion  Anterior Thigh Region  Severe depletion  Posterior Calf Region  Severe depletion  Edema (RD Assessment)  None  Hair  Reviewed  Eyes  Reviewed  Mouth  Reviewed  Skin  Reviewed  Nails  Reviewed     Diet Order:   Diet Order            Diet NPO time specified  Diet effective midnight              EDUCATION NEEDS:   Education needs have been addressed  Skin:  Skin Assessment: Reviewed RN Assessment  Last BM:  PTA (08/24/2018 per chart)  Height:   Ht Readings from Last 1 Encounters:  08/28/18 5' 10" (1.778 m)      Weight:   Wt Readings from Last 1 Encounters:  08/28/18 85 kg    Ideal Body Weight:  75.5 kg  BMI:  Body mass index is 26.9 kg/m.  Estimated Nutritional Needs:   Kcal:  2080-2400 (MSJ x 1.3-1.5)  Protein:  100-125 grams (1.2-1.5 grams/kg)  Fluid:  2-2.4 L/day (1 mL/kcal)   Stephens, MS, RD, LDN Office: [phone] Pager: [phone] After Hours/Weekend Pager: [phone]  

## 2018-08-28 NOTE — Anesthesia Post-op Follow-up Note (Signed)
Anesthesia QCDR form completed.        

## 2018-08-28 NOTE — Transfer of Care (Signed)
Immediate Anesthesia Transfer of Care Note  Patient: Ian Shaffer  Procedure(s) Performed: Procedure(s): ESOPHAGOGASTRODUODENOSCOPY (EGD) (N/A)  Patient Location: PACU and Endoscopy Unit  Anesthesia Type:General  Level of Consciousness: sedated  Airway & Oxygen Therapy: Patient Spontanous Breathing and Patient connected to nasal cannula oxygen  Post-op Assessment: Report given to RN and Post -op Vital signs reviewed and stable  Post vital signs: Reviewed and stable  Last Vitals:  Vitals:   08/28/18 0459 08/28/18 1449  BP: 132/76 140/67  Pulse: 81 91  Resp:  (!) 41  Temp: 37.4 C   SpO2: 93% 241%    Complications: No apparent anesthesia complications

## 2018-08-28 NOTE — Anesthesia Preprocedure Evaluation (Signed)
Anesthesia Evaluation  Patient identified by MRN, date of birth, ID band Patient awake    Reviewed: Allergy & Precautions, H&P , NPO status , Patient's Chart, lab work & pertinent test results, reviewed documented beta blocker date and time   History of Anesthesia Complications (+) DIFFICULT AIRWAY and history of anesthetic complications  Airway Mallampati: II  TM Distance: >3 FB Neck ROM: full    Dental no notable dental hx. (+) Caps, Dental Advidsory Given, Poor Dentition, Missing   Pulmonary neg shortness of breath, neg COPD, neg recent URI, former smoker,  Lung cancer s/p resection          Cardiovascular Exercise Tolerance: Poor hypertension, On Medications (-) angina+ Peripheral Vascular Disease  (-) CAD, (-) Past MI, (-) Cardiac Stents and (-) CABG (-) dysrhythmias (-) Valvular Problems/Murmurs     Neuro/Psych neg Seizures  Neuromuscular disease CVA, No Residual Symptoms negative neurological ROS  negative psych ROS   GI/Hepatic negative GI ROS, Neg liver ROS, GERD  Medicated,  Endo/Other  diabetes  Renal/GU CRFRenal disease     Musculoskeletal   Abdominal   Peds  Hematology negative hematology ROS (+)   Anesthesia Other Findings Past Medical History: 05/31/2014: Benign essential hypertension 06/2017: Cancer ([place])     Comment:  right lung  05/31/2014: Cerebral infarction ([place]) 05/31/2014: Cerebrovascular disease 02/23/2016: CKD (chronic kidney disease) stage 3, GFR 30-59 ml/min  (HCC) No date: CVA (cerebral infarction) No date: Diabetes mellitus without complication (HCC) No date: Difficult intubation No date: DVT (deep venous thrombosis) ([place]) 05/31/2014: Esophageal reflux No date: H/O: CVA (cerebrovascular accident) No date: Hyperlipidemia No date: Hypertension March 01, 2014: ICAO (internal carotid artery occlusion)     Comment:  Left 05/09/2017: Localized, primary osteoarthritis of shoulder  region 04/23/2017: Pleural effusion April, 4,2015: Stroke ([place]) 02/23/2016: Type 2 diabetes mellitus with peripheral neuropathy (HCC) No date: Weakness   Reproductive/Obstetrics negative OB ROS                             Anesthesia Physical  Anesthesia Plan  ASA: IV  Anesthesia Plan: General   Post-op Pain Management:    Induction: Intravenous  PONV Risk Score and Plan: 2 and Propofol infusion and TIVA  Airway Management Planned: Natural Airway and Nasal Cannula  Additional Equipment:   Intra-op Plan:   Post-operative Plan:   Informed Consent: I have reviewed the patients History and Physical, chart, labs and discussed the procedure including the risks, benefits and alternatives for the proposed anesthesia with the patient or authorized representative who has indicated his/her understanding and acceptance.     Plan Discussed with: CRNA  Anesthesia Plan Comments:         Anesthesia Quick Evaluation

## 2018-08-28 NOTE — Progress Notes (Signed)
Ian Shaffer at [facility] NAME: Ian Shaffer    MR#:  [id number]  DATE OF BIRTH:  02-02-1946  SUBJECTIVE:  CHIEF COMPLAINT:  No chief complaint on file.  -10 days history of melena, hemoglobin dropping and at 7.8 today.  No further active bleeding. -For EGD this afternoon  REVIEW OF SYSTEMS:  Review of Systems  Constitutional: Positive for malaise/fatigue. Negative for chills and fever.  HENT: Negative for congestion, ear discharge, hearing loss and nosebleeds.   Eyes: Negative for blurred vision and double vision.  Respiratory: Negative for cough, shortness of breath and wheezing.   Cardiovascular: Negative for chest pain, palpitations and leg swelling.  Gastrointestinal: Positive for abdominal pain. Negative for constipation, diarrhea, nausea and vomiting.  Genitourinary: Positive for flank pain. Negative for dysuria.  Musculoskeletal: Negative for myalgias.  Neurological: Negative for dizziness, focal weakness, seizures, weakness and headaches.  Psychiatric/Behavioral: Negative for depression.    DRUG ALLERGIES:   Allergies  Allergen Reactions  . Sulfa Antibiotics Swelling    Facial swelling No tongue or lips swelling, no difficulty breathing.  . Adhesive [Tape] Itching and Rash  . Amoxicillin Nausea Only and Other (See Comments)    Has patient had a PCN reaction causing immediate rash, facial/tongue/throat swelling, SOB or lightheadedness with hypotension: No Has patient had a PCN reaction causing severe rash involving mucus membranes or skin necrosis: No Has patient had a PCN reaction that required hospitalization: No Has patient had a PCN reaction occurring within the last 10 years: Yes If all of the above answers are "NO", then may proceed with Cephalosporin use.   . Other Rash    Surgical tape    VITALS:  Blood pressure 132/76, pulse 81, temperature 99.3 F (37.4 C), temperature source Oral, height 5\' 10"  (1.778 m), weight  85 kg, SpO2 97 %.  PHYSICAL EXAMINATION:  Physical Exam  GENERAL:  72 y.o.-year-old patient lying in the bed with no acute distress.  EYES: Pupils equal, round, reactive to light and accommodation. No scleral icterus. Extraocular muscles intact.  HEENT: Head atraumatic, normocephalic. Oropharynx and nasopharynx clear.  NECK:  Supple, no jugular venous distention. No thyroid enlargement, no tenderness.  LUNGS: Normal breath sounds bilaterally, no wheezing, rales,rhonchi or crepitation. No use of accessory muscles of respiration.  Decreased bibasilar breath sounds CARDIOVASCULAR: S1, S2 normal. No murmurs, rubs, or gallops.  ABDOMEN: Soft, nontender but discomfort in right upper quadrant palpation, nondistended. Bowel sounds present. No organomegaly or mass.  EXTREMITIES: No pedal edema, cyanosis, or clubbing.  NEUROLOGIC: Cranial nerves II through XII are intact. Muscle strength 5/5 in all extremities. Sensation intact. Gait not checked.  PSYCHIATRIC: The patient is alert and oriented x 3.  SKIN: No obvious rash, lesion, or ulcer.    LABORATORY PANEL:   CBC Recent Labs  Lab 08/28/18 0023 08/28/18 0712  WBC 13.1*  --   HGB 8.0* 7.8*  HCT 23.7*  --   PLT 316  --    ------------------------------------------------------------------------------------------------------------------  Chemistries  Recent Labs  Lab 08/27/18 1238 08/28/18 0023  NA 139 141  K 4.5 4.7  CL 103 105  CO2 25 26  GLUCOSE 129* 135*  BUN 26* 23  CREATININE 1.35* 1.44*  CALCIUM 9.8 8.9  AST 37  --   ALT 22  --   ALKPHOS 300*  --   BILITOT 0.5  --    ------------------------------------------------------------------------------------------------------------------  Cardiac Enzymes No results for input(s): TROPONINI in the last 168 hours. ------------------------------------------------------------------------------------------------------------------  RADIOLOGY:  No results found.  EKG:   Orders  placed or performed during the hospital encounter of 05/20/18  . ED EKG 12-Lead  . ED EKG 12-Lead  . EKG    ASSESSMENT AND PLAN:   72 year old male with past medical history significant for mesothelioma, CKD, CVA, diabetes, hypertension presents to hospital secondary to 10-day history of melena and dropping hemoglobin  1.  Acute on chronic anemia-secondary to likely upper GI bleed.  Patient does not take NSAIDs. -On aspirin and Plavix at home.  Last dose of Plavix was 3 days ago -History of duodenal ulcer in the past. -Appreciate GI consult.  For EGD today -Continue IV PPI -Hemoglobin check frequently and transfuse if less than 7  2.  Malignant mesothelioma-follows up with the cancer center -Status post 4 cycles of third line agent.  Most recent imaging with stable lung nodules and intrathoracic lesion but worsening liver mass. -Continue to follow-up with oncology as outpatient  3.  History of CVA-no residual deficits.  Aspirin and Plavix on hold for now with GI bleed and anemia  4.  Diabetes mellitus-only on sliding scale insulin due to being n.p.o. and sugars are well controlled  5.  Hypertension-on Toprol, Norvasc  6.  DVT prophylaxis-teds and SCDs only for now due to GI bleed    Wife updated at bedside.  Patient is independent and ambulatory at baseline   All the records are reviewed and case discussed with Care Management/Social Workerr. Management plans discussed with the patient, family and they are in agreement.  CODE STATUS: Full code  TOTAL TIME TAKING CARE OF THIS PATIENT: 37 minutes.   POSSIBLE D/C IN 1-2 DAYS, DEPENDING ON CLINICAL CONDITION.   Gladstone Lighter M.D on 08/28/2018 at 2:28 PM  Between 7am to 6pm - Pager - [phone]  After 6pm go to [email or web address] - password [email or web address] [facility]  Office  [phone]  CC: Primary care physician; Ian Late, MD

## 2018-08-28 NOTE — Progress Notes (Signed)
Upper GI endoscopy is normal.  Colonoscopy tomorrow -Patient having low-grade fevers.  Urine analysis and chest x-ray ordered.  Also blood cultures ordered. -Based on the results, will start appropriate antibiotics if needed. Also has elevated WBC

## 2018-08-28 NOTE — Op Note (Signed)
[facility] Patient Name: Ian Shaffer Procedure Date: 08/28/2018 2:29 PM MRN: [id number] Account #: [id number] Date of Birth: [birth year] Admit Type: Inpatient Age: 72 Room: [facility] ENDO ROOM 2 Gender: Male Note Status: Finalized Procedure:            Upper GI endoscopy Indications:          Melena Providers:            Jaceon Heiberger B. Bonna Gains MD, MD Referring MD:         Caprice Renshaw MD (Referring MD) Medicines:            Monitored Anesthesia Care Complications:        No immediate complications. Procedure:            Pre-Anesthesia Assessment:                       - The risks and benefits of the procedure and the                        sedation options and risks were discussed with the                        patient. All questions were answered and informed                        consent was obtained.                       - Patient identification and proposed procedure were                        verified prior to the procedure.                       - ASA Grade Assessment: II - A patient with mild                        systemic disease.                       After obtaining informed consent, the endoscope was                        passed under direct vision. Throughout the procedure,                        the patient's blood pressure, pulse, and oxygen                        saturations were monitored continuously. The Endoscope                        was introduced through the mouth, and advanced to the                        third part of duodenum. The upper GI endoscopy was                        accomplished with ease. The patient tolerated the  procedure well. Findings:      The examined esophagus was normal.      Two 3 to 4 mm sessile polyps with no bleeding and no stigmata of recent       bleeding were found in the gastric fundus and in the gastric body.       Biopsies were not performed due melena and anemia  on this admission.      The exam of the stomach was otherwise normal.      The examined duodenum was normal. Impression:           - Normal esophagus.                       - Two gastric polyps.                       - Normal examined duodenum.                       - No specimens collected.                       - No evidence of active or recent bleeding seen                        throughout the exam. Recommendation:       - Return patient to hospital ward for ongoing care.                       - Perform a colonoscopy tomorrow.                       - Clear liquid diet.                       - Continue Serial CBCs and transfuse PRN                       - The findings and recommendations were discussed with                        the patient.                       - The findings and recommendations were discussed with                        the patient's family.                       - Dr. Vicente Males is on call over the weekend and will be                        following the patient and performing the colonoscopy.                        Patient has been signed out to him. Please page with                        him any questions or concerns.                       - Pt may  need small bowel capsule study if colonoscopy                        is normal. Dr. Vicente Males to determine this after colonoscopy                        tomorrow. Procedure Code(s):    --- Professional ---                       [phone], Esophagogastroduodenoscopy, flexible, transoral;                        diagnostic, including collection of specimen(s) by                        brushing or washing, when performed (separate procedure) Diagnosis Code(s):    --- Professional ---                       K31.7, Polyp of stomach and duodenum                       K92.1, Melena (includes Hematochezia) CPT copyright 2017 American Medical Association. All rights reserved. The codes documented in this report are preliminary and upon  coder review may  be revised to meet current compliance requirements.  Vonda Antigua, MD Margretta Sidle B. Bonna Gains MD, MD 08/28/2018 2:53:29 PM This report has been signed electronically. Number of Addenda: 0 Note Initiated On: 08/28/2018 2:29 PM      [facility]

## 2018-08-28 NOTE — Plan of Care (Signed)
  Problem: Education: Goal: Knowledge of General Education information will improve Description: Including pain rating scale, medication(s)/side effects and non-pharmacologic comfort measures Outcome: Progressing   Problem: Health Behavior/Discharge Planning: Goal: Ability to manage health-related needs will improve Outcome: Progressing   Problem: Clinical Measurements: Goal: Ability to maintain clinical measurements within normal limits will improve Outcome: Progressing Goal: Will remain free from infection Outcome: Progressing Goal: Diagnostic test results will improve Outcome: Progressing Goal: Respiratory complications will improve Outcome: Progressing Goal: Cardiovascular complication will be avoided Outcome: Progressing   Problem: Activity: Goal: Risk for activity intolerance will decrease Outcome: Progressing   Problem: Pain Managment: Goal: General experience of comfort will improve Outcome: Progressing   Problem: Safety: Goal: Ability to remain free from injury will improve Outcome: Progressing   

## 2018-08-28 NOTE — Consult Note (Signed)
Ian Antigua, MD [address], [place], [place], [place], [address] [address], [place], [place], [place], [address] Phone: [phone]  Fax: [phone]  Consultation  Referring Provider:     Dr. Jerelyn Charles Primary Care Physician:  Derinda Late, MD Reason for Consultation:     Melena  Date of Admission:  08/27/2018 Date of Consultation:  08/28/2018         HPI:   Ian Shaffer is a 72 y.o. male with 10-day history of melena, on Plavix at home that has been held for 3 days by oncology due to decreasing hemoglobin and melena at home.  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  Patient is on aspirin at home.  No other NSAIDs.  No nausea or vomiting or hematemesis.  No prior EGDs.  No abdominal pain.  Past Medical History:  Diagnosis Date  . Benign essential hypertension 05/31/2014  . Cancer ([facility]) 06/2017   right lung   . Cerebral infarction ([facility]) 05/31/2014  . Cerebrovascular disease 05/31/2014  . CKD (chronic kidney disease) stage 3, GFR 30-59 ml/min (HCC) 02/23/2016  . CVA (cerebral infarction)   . Diabetes mellitus without complication ([facility])   . Difficult intubation   . DVT (deep venous thrombosis) ([facility])   . Esophageal reflux 05/31/2014  . H/O: CVA (cerebrovascular accident)   . Hyperlipidemia   . Hypertension   . ICAO (internal carotid artery occlusion) March 01, 2014   Left  . 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 Pleural effusion 04/23/2017  . Stroke [facility]) April, 4,2015  . Type 2 diabetes mellitus with peripheral neuropathy ([facility]) 02/23/2016  . Weakness     Past Surgical History:  Procedure Laterality Date  . CHEST TUBE INSERTION N/A 06/25/2017   Procedure: JJHERD CATHETER INSERTION;  Surgeon: Nestor Lewandowsky, MD;  Location: [facility] ORS;  Service: Thoracic;  Laterality: N/A;  . CHEST TUBE INSERTION Right 08/07/2017   Procedure: REMOVAL OF  PLEUR X CATHETER;  Surgeon: Nestor Lewandowsky, MD;  Location: [facility] ORS;  Service: General;  Laterality: Right;  . NASAL SINUS SURGERY  2000  . TONSILLECTOMY    . VIDEO ASSISTED THORACOSCOPY Right 06/25/2017   Procedure: VIDEO ASSISTED THORACOSCOPY WITH TALC PLEURODESIS, CHEST TUBE INSERTION;  Surgeon: Nestor Lewandowsky, MD;  Location: [facility] ORS;  Service: Thoracic;  Laterality: Right;    Prior to Admission medications   Medication Sig Start Date End Date Taking? Authorizing Provider  acetaminophen (TYLENOL) 325 MG tablet Take 2 tablets (650 mg total) by mouth every 6 (six) hours as needed for mild pain (or Fever >/= 101). 05/22/18  Yes Wieting, Richard, MD  amLODipine (NORVASC) 5 MG tablet Take 1 tablet by mouth daily. 05/27/18  Yes [provider]  aspirin EC 81 MG tablet Take 81 mg by mouth daily.   Yes [provider]  fluticasone (FLONASE) 50 MCG/ACT nasal spray Place 1 spray into both nostrils daily at 2 PM.    Yes [provider]  liraglutide (VICTOZA) 18 MG/3ML SOPN Inject 1.2 mg into the skin daily.   Yes [provider]  lisinopril (PRINIVIL,ZESTRIL) 10 MG tablet Take 10 mg by mouth daily.   Yes [provider]  metoprolol succinate (TOPROL-XL) 25 MG 24 hr tablet Take 25 mg by mouth daily.   Yes [provider]  Multiple Vitamins-Minerals (CENTRUM SILVER PO) Take 1 tablet by mouth daily.   Yes [provider]  ondansetron (ZOFRAN) 8 MG tablet Take 8 mg by mouth every 8 (eight) hours as needed for nausea or refractory nausea / vomiting.  07/31/17  Yes [provider]  oxyCODONE (OXY IR/ROXICODONE) 5 MG immediate release tablet Take 1 tablet (5 mg total) by mouth every 4 (four) hours as needed for severe pain. 08/11/18  Yes Verlon Au, NP  polyethylene glycol powder (GLYCOLAX/MIRALAX) powder Take 17 g by mouth daily as needed (for constipation.).   Yes [provider]  pravastatin (PRAVACHOL) 40 MG tablet Take 40 mg by  mouth daily.   Yes [provider]  dronabinol (MARINOL) 2.5 MG capsule Take 1 capsule (2.5 mg total) by mouth 2 (two) times daily before a meal. 08/25/18   Verlon Au, NP  montelukast (SINGULAIR) 10 MG tablet Take 1 tablet (10 mg total) by mouth at bedtime. Take 1 tablet-10 mg night prior x 3 days. Patient not taking: Reported on 08/27/2018 06/02/18   Cammie Sickle, MD  pantoprazole (PROTONIX) 20 MG tablet Take 20 mg by mouth 2 (two) times daily.     [provider]  prochlorperazine (COMPAZINE) 10 MG tablet Take 10 mg by mouth every 8 (eight) hours as needed for vomiting.  07/31/17   [provider]  triamcinolone cream (KENALOG) 0.1 % Apply topically 2 (two) times daily. Patient not taking: Reported on 08/27/2018 05/22/18   Loletha Grayer, MD    Family History  Problem Relation Age of Onset  . Hypertension Mother   . Hypertension Father      Social History   Tobacco Use  . Smoking status: Former Smoker    Types: Pipe    Last attempt to quit: 05/07/1974    Years since quitting: 44.3  . Smokeless tobacco: Never Used  . Tobacco comment: pt states he smoked a pipe a long time ago  Substance Use Topics  . Alcohol use: No  . Drug use: No    Allergies as of 08/27/2018 - Review Complete 08/27/2018  Allergen Reaction Noted  . Sulfa antibiotics Swelling 04/25/2014  . Adhesive [tape] Itching and Rash 07/08/2017  . Amoxicillin Nausea Only and Other (See Comments) 05/13/2016  . Other Rash 08/14/2017    Review of Systems:    All systems reviewed and negative except where noted in HPI.   Physical Exam:  Vital signs in last 24 hours: Vitals:   08/27/18 1951 08/27/18 2052 08/28/18 0039 08/28/18 0459  BP: 116/70   132/76  Pulse: (!) 101   81  Temp: (!) 102.7 F (39.3 C) (!) 101.7 F (38.7 C) 98.1 F (36.7 C) 99.3 F (37.4 C)  TempSrc: Oral Oral Oral Oral  SpO2: 97%   97%  Weight:   85 kg   Height:   5\' 10"  (1.778 m)    Last BM Date:  08/24/18 General:   Pleasant, cooperative in NAD Head:  Normocephalic and atraumatic. Eyes:   No icterus.   Conjunctiva pink. PERRLA. Ears:  Normal auditory acuity. Neck:  Supple; no masses or thyroidomegaly Lungs: Respirations even and unlabored. Lungs clear to auscultation bilaterally.   No wheezes, crackles, or rhonchi.  Abdomen:  Soft, nondistended, nontender. Normal bowel sounds. No appreciable masses or hepatomegaly.  No rebound or guarding.  Neurologic:  Alert and oriented x3;  grossly normal neurologically. Skin:  Intact without significant lesions or rashes. Cervical Nodes:  No significant cervical adenopathy. Psych:  Alert and cooperative. Normal affect.  LAB RESULTS: Recent Labs    08/25/18  1309 08/27/18 1106 08/27/18 1238 08/27/18 1739 08/28/18 0023 08/28/18 0712  WBC 13.3*  --  13.4*  --  13.1*  --   HGB 9.0* 8.8* 8.9* 8.1* 8.0* 7.8*  HCT 26.9* 26.7* 26.9*  --  23.7*  --   PLT 350  --  364  --  316  --    BMET Recent Labs    08/25/18 1309 08/27/18 1238 08/28/18 0023  NA 139 139 141  K 4.4 4.5 4.7  CL 103 103 105  CO2 25 25 26   GLUCOSE 176* 129* 135*  BUN 22 26* 23  CREATININE 1.38* 1.35* 1.44*  CALCIUM 9.6 9.8 8.9   LFT Recent Labs    08/27/18 1238  PROT 7.4  ALBUMIN 3.3*  AST 37  ALT 22  ALKPHOS 300*  BILITOT 0.5   PT/INR Recent Labs    08/27/18 1502  LABPROT 15.1  INR 1.20    STUDIES: No results found.    Impression / Plan:   Ian Shaffer is a 72 y.o. y/o male with history of malignant mesothelioma, with melena at home and declining hemoglobin, in the setting of Plavix use  Patient has been off Plavix for 3 days Melena and anemia concerning for possible peptic ulcer disease in the setting of aspirin use Will evaluate with EGD today Continue n.p.o. PPI IV twice daily  Continue serial CBCs and transfuse PRN Avoid NSAIDs Maintain 2 large-bore IV lines Please page GI with any acute hemodynamic changes, or signs of active GI  bleeding  I have discussed alternative options, risks & benefits,  which include, but are not limited to, bleeding, infection, perforation,respiratory complication & drug reaction.  The patient agrees with this plan & written consent will be obtained.     Thank you for involving me in the care of this patient.      LOS: 1 day   Virgel Manifold, MD  08/28/2018, 11:18 AM

## 2018-08-29 ENCOUNTER — Encounter
Admission: AD | Disposition: A | Discharge status: Home / Self Care | Payer: Self-pay | Source: Ambulatory Visit | Attending: Specialist

## 2018-08-29 ENCOUNTER — Encounter: Payer: Self-pay | Admitting: Gastroenterology

## 2018-08-29 LAB — BASIC METABOLIC PANEL
ANION GAP: 8 (ref 5–15)
BUN: 21 mg/dL (ref 8–23)
CHLORIDE: 103 mmol/L (ref 98–111)
CO2: 28 mmol/L (ref 22–32)
Calcium: 8.8 mg/dL — ABNORMAL LOW (ref 8.9–10.3)
Creatinine, Ser: 1.57 mg/dL — ABNORMAL HIGH (ref 0.61–1.24)
GFR calc Af Amer: 48 mL/min — ABNORMAL LOW (ref >60)
GFR, EST NON AFRICAN AMERICAN: 42 mL/min — AB (ref >60)
Glucose, Bld: 139 mg/dL — ABNORMAL HIGH (ref 70–99)
POTASSIUM: 4.5 mmol/L (ref 3.5–5.1)
Sodium: 139 mmol/L (ref 135–145)

## 2018-08-29 LAB — HEMOGLOBIN A1C
HEMOGLOBIN A1C: 6.7 % — AB (ref 4.8–5.6)
Hgb A1c MFr Bld: 6.6 % — ABNORMAL HIGH (ref 4.8–5.6)
MEAN PLASMA GLUCOSE: 143 mg/dL
Mean Plasma Glucose: 146 mg/dL

## 2018-08-29 LAB — CBC
HEMATOCRIT: 24 % — AB (ref 40.0–52.0)
HEMOGLOBIN: 7.8 g/dL — AB (ref 13.0–18.0)
MCH: 29 pg (ref 26.0–34.0)
MCHC: 32.6 g/dL (ref 32.0–36.0)
MCV: 89.1 fL (ref 80.0–100.0)
PLATELETS: 319 10*3/uL (ref 150–440)
RBC: 2.7 MIL/uL — AB (ref 4.40–5.90)
RDW: 18.3 % — ABNORMAL HIGH (ref 11.5–14.5)
WBC: 10.8 10*3/uL — ABNORMAL HIGH (ref 3.8–10.6)

## 2018-08-29 LAB — GLUCOSE, CAPILLARY
Glucose-Capillary: 135 mg/dL — ABNORMAL HIGH (ref 70–99)
Glucose-Capillary: 123 mg/dL — ABNORMAL HIGH (ref 70–99)
Glucose-Capillary: 121 mg/dL — ABNORMAL HIGH (ref 70–99)
Glucose-Capillary: 156 mg/dL — ABNORMAL HIGH (ref 70–99)

## 2018-08-29 SURGERY — COLONOSCOPY
Anesthesia: General | Laterality: Left

## 2018-08-29 MED ORDER — SODIUM CHLORIDE 0.9 % IV SOLN: INTRAVENOUS | Status: DC

## 2018-08-29 MED ORDER — PEG 3350-KCL-NA BICARB-NACL 420 G PO SOLR
4000.0000 mL | Freq: Once | ORAL | Status: DC
  Filled 2018-08-29: qty 4000

## 2018-08-29 MED ORDER — POLYETHYLENE GLYCOL 3350 17 GM/SCOOP PO POWD
1.0000 | Freq: Once | ORAL | Status: AC
  Administered 2018-08-29: 17:00:00 255 g via ORAL
  Filled 2018-08-29: qty 255

## 2018-08-29 MED ORDER — POLYETHYLENE GLYCOL 3350 17 GM/SCOOP PO POWD
1.0000 | Freq: Once | ORAL | Status: DC
  Filled 2018-08-29: qty 255

## 2018-08-29 MED ORDER — SODIUM CHLORIDE 0.9 % IV SOLN
INTRAVENOUS | Status: AC
  Administered 2018-08-29 – 2018-08-30 (×2): via INTRAVENOUS

## 2018-08-29 NOTE — Progress Notes (Signed)
Pt unable to consume entire bowel prep. Drank approximately 3/4 of the 4 liters of Golytely. Stools still dark with some formed pieces. Notified Dr Vicente Males. Order to perform colonoscopy tomorrow instead of today. See new orders. Pt not very happy about not having the colonoscopy today. I told him that if he was not cleaned out that it would be a waste of time to go to the procedure room because the doctor would not be able to see anything. He verbalized understanding.

## 2018-08-29 NOTE — Progress Notes (Signed)
[facility] NAME: Ian Shaffer    MR#:  [id number]  DATE OF BIRTH:  1946/06/10  SUBJECTIVE:   Patient could not tolerate his colonoscopy prep overnight and therefore plan for doing the prep again today.  Hemoglobin remained stable.  Upper GI endoscopy did not show any evidence of acute bleeding.  REVIEW OF SYSTEMS:  Review of Systems  Constitutional: Positive for malaise/fatigue. Negative for chills and fever.  HENT: Negative for congestion, ear discharge, hearing loss and nosebleeds.   Eyes: Negative for blurred vision and double vision.  Respiratory: Negative for cough, shortness of breath and wheezing.   Cardiovascular: Negative for chest pain, palpitations and leg swelling.  Gastrointestinal: Negative for abdominal pain, constipation, diarrhea, nausea and vomiting.  Genitourinary: Negative for dysuria and flank pain.  Musculoskeletal: Negative for myalgias.  Neurological: Positive for weakness (generalized). Negative for dizziness, focal weakness, seizures and headaches.  Psychiatric/Behavioral: Negative for depression.    DRUG ALLERGIES:   Allergies  Allergen Reactions  . Sulfa Antibiotics Swelling    Facial swelling No tongue or lips swelling, no difficulty breathing.  . Adhesive [Tape] Itching and Rash  . Amoxicillin Nausea Only and Other (See Comments)    Has patient had a PCN reaction causing immediate rash, facial/tongue/throat swelling, SOB or lightheadedness with hypotension: No Has patient had a PCN reaction causing severe rash involving mucus membranes or skin necrosis: No Has patient had a PCN reaction that required hospitalization: No Has patient had a PCN reaction occurring within the last 10 years: Yes If all of the above answers are "NO", then may proceed with Cephalosporin use.   . Other Rash    Surgical tape    VITALS:  Blood pressure 138/88, pulse 79, temperature 98.5 F (36.9 C), temperature  source Oral, resp. rate 20, height 5\' 10"  (1.778 m), weight 85 kg, SpO2 98 %.  PHYSICAL EXAMINATION:  Physical Exam  GENERAL:  72 y.o.-year-old patient lying in the bed with no acute distress.  EYES: Pupils equal, round, reactive to light and accommodation. No scleral icterus. Extraocular muscles intact.  HEENT: Head atraumatic, normocephalic. Oropharynx and nasopharynx clear.  NECK:  Supple, no jugular venous distention. No thyroid enlargement, no tenderness.  LUNGS: Normal breath sounds bilaterally, no wheezing, rales, rhonchi or crepitation. No use of accessory muscles of respiration.  Decreased bibasilar breath sounds CARDIOVASCULAR: S1, S2 normal. No murmurs, rubs, or gallops.  ABDOMEN: Soft, nontender but discomfort in right upper quadrant palpation, nondistended. Bowel sounds present. No organomegaly or mass.  EXTREMITIES: No pedal edema, cyanosis, or clubbing.  NEUROLOGIC: Cranial nerves II through XII are intact.  No focal motor and sensory deficits bilaterally PSYCHIATRIC: The patient is alert and oriented x 3.  SKIN: No obvious rash, lesion, or ulcer.    LABORATORY PANEL:   CBC Recent Labs  Lab 08/29/18 0430  WBC 10.8*  HGB 7.8*  HCT 24.0*  PLT 319   ------------------------------------------------------------------------------------------------------------------  Chemistries  Recent Labs  Lab 08/27/18 1238  08/29/18 0430  NA 139   < > 139  K 4.5   < > 4.5  CL 103   < > 103  CO2 25   < > 28  GLUCOSE 129*   < > 139*  BUN 26*   < > 21  CREATININE 1.35*   < > 1.57*  CALCIUM 9.8   < > 8.8*  AST 37  --   --   ALT 22  --   --  ALKPHOS 300*  --   --   BILITOT 0.5  --   --    < > = values in this interval not displayed.   ------------------------------------------------------------------------------------------------------------------  Cardiac Enzymes No results for input(s): TROPONINI in the last 168  hours. ------------------------------------------------------------------------------------------------------------------  RADIOLOGY:  Dg Chest 2 View  Result Date: 08/28/2018 CLINICAL DATA:  Fever today.  History of lung cancer. EXAM: CHEST - 2 VIEW COMPARISON:  Chest x-ray 08/11/2018, chest CT 08/03/2018 FINDINGS: Cardiomediastinal silhouette is normal. Mediastinal contours appear intact. There is no evidence of focal airspace consolidation, pleural effusion or pneumothorax. Interval enlargement of multiple bilateral pulmonary masses. The largest mass in the left lower lung field measures 4.0 cm from prior measurement of 3.6 cm on chest x-ray dated 08/11/2018. Stable volume loss in the right hemithorax. Right peri diaphragmatic mass has also increased in size. Osseous structures are without acute abnormality. Soft tissues are grossly normal. IMPRESSION: Worsening pulmonary metastatic disease. Right pleural thickening versus pleural effusion. Electronically Signed   By: Fidela Salisbury M.D.   On: 08/28/2018 17:30    EKG:   Orders placed or performed during the hospital encounter of 05/20/18  . ED EKG 12-Lead  . ED EKG 12-Lead  . EKG    ASSESSMENT AND PLAN:   72 year old male with past medical history significant for mesothelioma, CKD, CVA, diabetes, hypertension presents to hospital secondary to 10-day history of melena and dropping hemoglobin  1.  Acute on chronic anemia-secondary to likely upper GI bleed.  Patient does not take NSAIDs. -On aspirin and Plavix at home.  Last dose of Plavix was 3 days ago -History of duodenal ulcer in the past. -Appreciate GI consult. S/p EGD yesterday showing no evidence of acute bleeding. - Plan for colonoscopy but patient could not finish the prep overnight and plan for reprepping for colonoscopy tomorrow. - Continue IV PPI - follow Hg. And transfuse if < 7.    2.  Malignant mesothelioma-follows up with the cancer center - Status post 4 cycles of  third line agent.  Most recent imaging with stable lung nodules and intrathoracic lesion but worsening liver mass. - Continue to follow-up with oncology as outpatient  3.  History of CVA- no residual deficits.  Aspirin and Plavix on hold for now with GI bleed and anemia  4.  Diabetes mellitus- cont. only on sliding scale insulin and follow BS  5.  Hypertension-on Toprol, Norvasc  Pt. Wife at bedside and plan of care explained to her and pt.    All the records are reviewed and case discussed with Care Management/Social Workerr. Management plans discussed with the patient, family and they are in agreement.  CODE STATUS: Full code  TOTAL TIME TAKING CARE OF THIS PATIENT: 30 minutes.   POSSIBLE D/C IN 1-2 DAYS, DEPENDING ON CLINICAL CONDITION.   Henreitta Leber M.D on 08/29/2018 at 1:49 PM  Between 7am to 6pm - Pager - [phone]  After 6pm go to [email or web address] - password [email or web address] [facility]  Office  [phone]  CC: Primary care physician; Derinda Late, MD

## 2018-08-29 NOTE — Progress Notes (Signed)
Ian Shaffer , MD [address], [place], [place], [place], [address] [address] [place], [place], [place], [address] Phone: [phone]  Fax: [phone]   Ian Shaffer is being followed for melena  Day   Subjective: Ian Shaffer, no red blood    Objective: Vital signs in last 24 hours: Vitals:   08/28/18 1542 08/28/18 2048 08/29/18 0407 08/29/18 1154  BP: 136/70 135/72 119/70 138/88  Pulse: 86 92 85 79  Resp: 18 20 20    Temp: 99.2 F (37.3 C) 100.1 F (37.8 C) 98.9 F (37.2 C) 98.5 F (36.9 C)  TempSrc: Oral Oral Oral Oral  SpO2: 98% 97% 98% 98%  Weight:      Height:       Weight change:   Intake/Output Summary (Last 24 hours) at 08/29/2018 1256 Last data filed at 08/29/2018 0702 Gross per 24 hour  Intake 651.21 ml  Output 100 ml  Net 551.21 ml     Exam: Heart:: Regular rate and rhythm, S1S2 present or without murmur or extra heart sounds Lungs: normal, clear to auscultation and clear to auscultation and percussion Abdomen: soft, nontender, normal bowel sounds   Lab Results: @LABTEST2 @ Micro Results: Recent Results (from the past 240 hour(s))  MRSA PCR Screening     Status: None   Collection Time: 08/27/18  3:03 PM  Result Value Ref Range Status   MRSA by PCR NEGATIVE NEGATIVE Final    Comment:        The GeneXpert MRSA Assay (FDA approved for NASAL specimens only), is one component of a comprehensive MRSA colonization surveillance program. It is not intended to diagnose MRSA infection nor to guide or monitor treatment for MRSA infections. Performed at [facility], [address] [place], [place] [address]   CULTURE, BLOOD (ROUTINE X 2) w Reflex to ID Panel     Status: None (Preliminary result)   Collection Time: 08/28/18  4:53 PM  Result Value Ref Range Status   Specimen Description BLOOD R ARM  Final   Special Requests   Final    BOTTLES DRAWN AEROBIC AND ANAEROBIC Blood Culture results may not be optimal due to an  inadequate volume of blood received in culture bottles   Culture   Final    NO GROWTH < 24 HOURS Performed at [facility], [address], [place], [place] [address]    Report Status PENDING  Incomplete  CULTURE, BLOOD (ROUTINE X 2) w Reflex to ID Panel     Status: None (Preliminary result)   Collection Time: 08/28/18  6:26 PM  Result Value Ref Range Status   Specimen Description BLOOD R HAND  Final   Special Requests   Final    BOTTLES DRAWN AEROBIC AND ANAEROBIC Blood Culture adequate volume   Culture   Final    NO GROWTH < 24 HOURS Performed at [facility], [address], [place], [place] [address]    Report Status PENDING  Incomplete   Studies/Results: Dg Chest 2 View  Result Date: 08/28/2018 CLINICAL DATA:  Fever today.  History of lung cancer. EXAM: CHEST - 2 VIEW COMPARISON:  Chest x-ray 08/11/2018, chest CT 08/03/2018 FINDINGS: Cardiomediastinal silhouette is normal. Mediastinal contours appear intact. There is no evidence of focal airspace consolidation, pleural effusion or pneumothorax. Interval enlargement of multiple bilateral pulmonary masses. The largest mass in the left lower lung field measures 4.0 cm from prior measurement of 3.6 cm on chest x-ray dated 08/11/2018. Stable volume loss  in the right hemithorax. Right peri diaphragmatic mass has also increased in size. Osseous structures are without acute abnormality. Soft tissues are grossly normal. IMPRESSION: Worsening pulmonary metastatic disease. Right pleural thickening versus pleural effusion. Electronically Signed   By: Fidela Salisbury M.D.   On: 08/28/2018 17:30   Medications: I have reviewed the patient's current medications. Scheduled Meds: . amLODipine  5 mg Oral Daily  . dronabinol  2.5 mg Oral BID AC  . ferrous sulfate  325 mg Oral BID WC  . insulin aspart  0-15 Units Subcutaneous TID WC  . insulin aspart  0-5 Units Subcutaneous QHS  . metoprolol succinate  25 mg Oral Daily  .  multivitamin with minerals  1 tablet Oral Daily  . [START ON 08/31/2018] pantoprazole  40 mg Intravenous Q12H  . pneumococcal 23 valent vaccine  0.5 mL Intramuscular Tomorrow-1000  . polyethylene glycol powder  1 Container Oral Once  . pravastatin  40 mg Oral q1800  . sodium chloride flush  3 mL Intravenous Q12H  . sucralfate  1 g Oral TID WC & HS   Continuous Infusions: . sodium chloride    . sodium chloride 75 mL/hr at 08/29/18 1138  . cefTRIAXone (ROCEPHIN)  IV Stopped (08/28/18 2057)  . pantoprozole (PROTONIX) infusion 8 mg/hr (08/29/18 0702)   PRN Meds:.sodium chloride, acetaminophen, fluticasone, ondansetron, oxyCODONE, polyethylene glycol, prochlorperazine, promethazine, sodium chloride flush   Assessment: Active Problems:   Melena   Protein-calorie malnutrition, severe   Gastric polyp   [place] Le[place] Bodnar 72 y.o. male admitted with melena on 08/27/18 . Had been on Palvix.  Underwent EGD 08/28/18 which was normal. Plan was for colonoscopy today but he didn't complete the prep.   Plan: 1. Colonoscopy tomorrow , if negative outpatient capsule study of the small bowel      LOS: 2 days   Ian Bellows, MD 08/29/2018, 12:56 PM

## 2018-08-30 ENCOUNTER — Inpatient Hospital Stay: Payer: Medicare Other | Admitting: Anesthesiology

## 2018-08-30 ENCOUNTER — Encounter
Admission: AD | Disposition: A | Discharge status: Home / Self Care | Payer: Self-pay | Source: Ambulatory Visit | Attending: Specialist

## 2018-08-30 ENCOUNTER — Encounter: Payer: Self-pay | Admitting: *Deleted

## 2018-08-30 HISTORY — PX: COLONOSCOPY WITH PROPOFOL: SHX5780

## 2018-08-30 LAB — GLUCOSE, CAPILLARY: GLUCOSE-CAPILLARY: 128 mg/dL — AB (ref 70–99)

## 2018-08-30 LAB — URINE CULTURE: CULTURE: NO GROWTH

## 2018-08-30 LAB — HEMOGLOBIN: Hemoglobin: 7.5 g/dL — ABNORMAL LOW (ref 13.0–18.0)

## 2018-08-30 SURGERY — COLONOSCOPY WITH PROPOFOL
Anesthesia: General

## 2018-08-30 MED ORDER — LIDOCAINE 2% (20 MG/ML) 5 ML SYRINGE
INTRAMUSCULAR | Status: DC | PRN
  Administered 2018-08-30: 50 mg via INTRAVENOUS

## 2018-08-30 MED ORDER — PROPOFOL 500 MG/50ML IV EMUL
INTRAVENOUS | Status: DC | PRN
  Administered 2018-08-30: 200 ug/kg/min via INTRAVENOUS

## 2018-08-30 MED ORDER — PROPOFOL 500 MG/50ML IV EMUL
INTRAVENOUS | Status: AC
  Filled 2018-08-30: qty 50

## 2018-08-30 MED ORDER — PANTOPRAZOLE SODIUM 40 MG PO TBEC: 40.0000 mg | DELAYED_RELEASE_TABLET | Freq: Two times a day (BID) | ORAL | 1 refills | Status: DC

## 2018-08-30 MED ORDER — PROPOFOL 10 MG/ML IV BOLUS
INTRAVENOUS | Status: DC | PRN
  Administered 2018-08-30: 70 mg via INTRAVENOUS

## 2018-08-30 NOTE — Progress Notes (Signed)
Pt completed bowel prep and compliant with NPO status. Pt BM is mostly clear with a small amount of with black sediment.  Pt A&O x4. VS stable. Will continue to monitor.

## 2018-08-30 NOTE — Progress Notes (Signed)
MD order received to discharge pt home today in [facility]; verbally reviewed AVS with pt, gave Rx for Protonix to pt; no questions voiced at this time

## 2018-08-30 NOTE — Progress Notes (Signed)
Report called to Tiffany RN.

## 2018-08-30 NOTE — Anesthesia Preprocedure Evaluation (Signed)
Anesthesia Evaluation  Patient identified by MRN, date of birth, ID band Patient awake    Reviewed: Allergy & Precautions, H&P , NPO status , Patient's Chart, lab work & pertinent test results, reviewed documented beta blocker date and time   History of Anesthesia Complications (+) DIFFICULT AIRWAY and history of anesthetic complications  Airway Mallampati: II  TM Distance: >3 FB Neck ROM: full    Dental no notable dental hx. (+) Caps, Dental Advidsory Given, Poor Dentition, Missing   Pulmonary neg shortness of breath, neg COPD, neg recent URI, former smoker,  Lung cancer s/p resection          Cardiovascular Exercise Tolerance: Poor hypertension, On Medications (-) angina+ Peripheral Vascular Disease  (-) CAD, (-) Past MI, (-) Cardiac Stents and (-) CABG (-) dysrhythmias (-) Valvular Problems/Murmurs     Neuro/Psych neg Seizures  Neuromuscular disease CVA, No Residual Symptoms negative neurological ROS  negative psych ROS   GI/Hepatic negative GI ROS, Neg liver ROS, GERD  Medicated,  Endo/Other  diabetes  Renal/GU CRFRenal disease     Musculoskeletal   Abdominal   Peds  Hematology negative hematology ROS (+)   Anesthesia Other Findings Past Medical History: 05/31/2014: Benign essential hypertension 06/2017: Cancer ([facility])     Comment:  right lung  05/31/2014: Cerebral infarction ([facility]) 05/31/2014: Cerebrovascular disease 02/23/2016: CKD (chronic kidney disease) stage 3, GFR 30-59 ml/min  (HCC) No date: CVA (cerebral infarction) No date: Diabetes mellitus without complication (HCC) No date: Difficult intubation No date: DVT (deep venous thrombosis) ([place]) 05/31/2014: Esophageal reflux No date: H/O: CVA (cerebrovascular accident) No date: Hyperlipidemia No date: Hypertension March 01, 2014: ICAO (internal carotid artery occlusion)     Comment:  Left 05/09/2017: Localized, primary osteoarthritis of shoulder  region 04/23/2017: Pleural effusion April, 4,2015: Stroke ([facility]) 02/23/2016: Type 2 diabetes mellitus with peripheral neuropathy (HCC) No date: Weakness   Reproductive/Obstetrics negative OB ROS                             Anesthesia Physical  Anesthesia Plan  ASA: IV  Anesthesia Plan: General   Post-op Pain Management:    Induction: Intravenous  PONV Risk Score and Plan: 2 and Propofol infusion and TIVA  Airway Management Planned: Natural Airway and Nasal Cannula  Additional Equipment:   Intra-op Plan:   Post-operative Plan:   Informed Consent: I have reviewed the patients History and Physical, chart, labs and discussed the procedure including the risks, benefits and alternatives for the proposed anesthesia with the patient or authorized representative who has indicated his/her understanding and acceptance.     Plan Discussed with: CRNA  Anesthesia Plan Comments:         Anesthesia Quick Evaluation

## 2018-08-30 NOTE — Progress Notes (Signed)
[facility] NAME: Ian Shaffer    MR#:  [id number]  DATE OF BIRTH:  August 28, 1946  SUBJECTIVE:   Hg 7.5 this morning, tolerated his colonoscopy prep overnight.  Colonoscopy done today showing no evidence of acute bleeding.  REVIEW OF SYSTEMS:  Review of Systems  Constitutional: Positive for malaise/fatigue. Negative for chills and fever.  HENT: Negative for congestion, ear discharge, hearing loss and nosebleeds.   Eyes: Negative for blurred vision and double vision.  Respiratory: Negative for cough, shortness of breath and wheezing.   Cardiovascular: Negative for chest pain, palpitations and leg swelling.  Gastrointestinal: Negative for abdominal pain, constipation, diarrhea, nausea and vomiting.  Genitourinary: Negative for dysuria and flank pain.  Musculoskeletal: Negative for myalgias.  Neurological: Positive for weakness (generalized). Negative for dizziness, focal weakness, seizures and headaches.  Psychiatric/Behavioral: Negative for depression.    DRUG ALLERGIES:   Allergies  Allergen Reactions  . Sulfa Antibiotics Swelling    Facial swelling No tongue or lips swelling, no difficulty breathing.  . Adhesive [Tape] Itching and Rash  . Amoxicillin Nausea Only and Other (See Comments)    Has patient had a PCN reaction causing immediate rash, facial/tongue/throat swelling, SOB or lightheadedness with hypotension: No Has patient had a PCN reaction causing severe rash involving mucus membranes or skin necrosis: No Has patient had a PCN reaction that required hospitalization: No Has patient had a PCN reaction occurring within the last 10 years: Yes If all of the above answers are "NO", then may proceed with Cephalosporin use.   . Other Rash    Surgical tape    VITALS:  Blood pressure 135/77, pulse 75, temperature 98.3 F (36.8 C), temperature source Oral, resp. rate (!) 22, height 5\' 10"  (1.778 m), weight 85 kg, SpO2 100  %.  PHYSICAL EXAMINATION:  Physical Exam  GENERAL:  73 y.o.-year-old patient lying in the bed with no acute distress.  EYES: Pupils equal, round, reactive to light and accommodation. No scleral icterus. Extraocular muscles intact.  HEENT: Head atraumatic, normocephalic. Oropharynx and nasopharynx clear.  NECK:  Supple, no jugular venous distention. No thyroid enlargement, no tenderness.  LUNGS: Normal breath sounds bilaterally, no wheezing, rales, rhonchi or crepitation. No use of accessory muscles of respiration.  Decreased bibasilar breath sounds CARDIOVASCULAR: S1, S2 normal. No murmurs, rubs, or gallops.  ABDOMEN: Soft, nontender but discomfort in right upper quadrant palpation, nondistended. Bowel sounds present. No organomegaly or mass.  EXTREMITIES: No pedal edema, cyanosis, or clubbing.  NEUROLOGIC: Cranial nerves II through XII are intact.  No focal motor and sensory deficits bilaterally PSYCHIATRIC: The patient is alert and oriented x 3.  SKIN: No obvious rash, lesion, or ulcer.    LABORATORY PANEL:   CBC Recent Labs  Lab 08/29/18 0430 08/30/18 1033  WBC 10.8*  --   HGB 7.8* 7.5*  HCT 24.0*  --   PLT 319  --    ------------------------------------------------------------------------------------------------------------------  Chemistries  Recent Labs  Lab 08/27/18 1238  08/29/18 0430  NA 139   < > 139  K 4.5   < > 4.5  CL 103   < > 103  CO2 25   < > 28  GLUCOSE 129*   < > 139*  BUN 26*   < > 21  CREATININE 1.35*   < > 1.57*  CALCIUM 9.8   < > 8.8*  AST 37  --   --   ALT 22  --   --  ALKPHOS 300*  --   --   BILITOT 0.5  --   --    < > = values in this interval not displayed.   ------------------------------------------------------------------------------------------------------------------  Cardiac Enzymes No results for input(s): TROPONINI in the last 168  hours. ------------------------------------------------------------------------------------------------------------------  RADIOLOGY:  Dg Chest 2 View  Result Date: 08/28/2018 CLINICAL DATA:  Fever today.  History of lung cancer. EXAM: CHEST - 2 VIEW COMPARISON:  Chest x-ray 08/11/2018, chest CT 08/03/2018 FINDINGS: Cardiomediastinal silhouette is normal. Mediastinal contours appear intact. There is no evidence of focal airspace consolidation, pleural effusion or pneumothorax. Interval enlargement of multiple bilateral pulmonary masses. The largest mass in the left lower lung field measures 4.0 cm from prior measurement of 3.6 cm on chest x-ray dated 08/11/2018. Stable volume loss in the right hemithorax. Right peri diaphragmatic mass has also increased in size. Osseous structures are without acute abnormality. Soft tissues are grossly normal. IMPRESSION: Worsening pulmonary metastatic disease. Right pleural thickening versus pleural effusion. Electronically Signed   By: Fidela Salisbury M.D.   On: 08/28/2018 17:30    EKG:   Orders placed or performed during the hospital encounter of 05/20/18  . ED EKG 12-Lead  . ED EKG 12-Lead  . EKG    ASSESSMENT AND PLAN:   72 year old male with past medical history significant for mesothelioma, CKD, CVA, diabetes, hypertension presents to hospital secondary to 10-day history of melena and dropping hemoglobin  1.  Acute on chronic anemia-secondary to likely upper GI bleed.  Patient does not take NSAIDs. -On aspirin and Plavix at home.  Last dose of Plavix was 3 days ago -History of duodenal ulcer in the past. -Appreciate GI consult. S/p EGD  showing no evidence of acute bleeding or any PUD/Duodenal ulcer. -That is post colonoscopy today showing stool through the entire colon but no evidence of acute bleeding. -Hemoglobin 7.5 today. Cont. PPI BID.  2.  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  Most recent imaging with stable lung nodules and intrathoracic lesion but worsening liver mass. - Continue to follow-up with oncology as outpatient  3.  History of CVA- no residual deficits.   - will need to discuss with GI if o.k. To resume ASA, Plavix.   4.  Diabetes mellitus- cont. only on sliding scale insulin and BS Stable.   5.  Hypertension- cont. Toprol, Norvasc  Will discuss with GI about plan of care and possible d/c home today or tomorrow.    All the records are reviewed and case discussed with Care Management/Social Workerr. Management plans discussed with the patient, family and they are in agreement.  CODE STATUS: Full code  TOTAL TIME TAKING CARE OF THIS PATIENT: 30 minutes.   POSSIBLE D/C today or tomorrow, DEPENDING ON CLINICAL CONDITION.   Henreitta Leber M.D on 08/30/2018 at 12:44 PM  Between 7am to 6pm - Pager - [phone]  After 6pm go to [email or web address] - password [email or web address] [facility]  Office  [phone]  CC: Primary care physician; Derinda Late, MD

## 2018-08-30 NOTE — Progress Notes (Signed)
Pt discharged via wheelchair by auxillary to the visitor's entrance

## 2018-08-30 NOTE — Transfer of Care (Signed)
Immediate Anesthesia Transfer of Care Note  Patient: Ian Shaffer  Procedure(s) Performed: COLONOSCOPY WITH PROPOFOL (N/A )  Patient Location: PACU  Anesthesia Type:General  Level of Consciousness: sedated  Airway & Oxygen Therapy: Patient connected to nasal cannula oxygen  Post-op Assessment: Post -op Vital signs reviewed and stable  Post vital signs: stable  Last Vitals:  Vitals Value Taken Time  BP    Temp    Pulse    Resp    SpO2      Last Pain:  Vitals:   08/30/18 0839  TempSrc:   PainSc: 0-No pain         Complications: No apparent anesthesia complications

## 2018-08-30 NOTE — Anesthesia Postprocedure Evaluation (Signed)
Anesthesia Post Note  Patient: Ian Shaffer  Procedure(s) Performed: COLONOSCOPY WITH PROPOFOL (N/A )  Patient location during evaluation: Endoscopy Anesthesia Type: General Level of consciousness: awake and alert Pain management: pain level controlled Vital Signs Assessment: post-procedure vital signs reviewed and stable Respiratory status: spontaneous breathing, nonlabored ventilation, respiratory function stable and patient connected to nasal cannula oxygen Cardiovascular status: blood pressure returned to baseline and stable Postop Assessment: no apparent nausea or vomiting Anesthetic complications: no     Last Vitals:  Vitals:   08/30/18 1222 08/30/18 1253  BP: 135/77 123/86  Pulse: 75 74  Resp: (!) 22 18  Temp:  36.7 C  SpO2: 100% 98%    Last Pain:  Vitals:   08/30/18 1253  TempSrc: Oral  PainSc:                  Martha Clan

## 2018-08-30 NOTE — Anesthesia Post-op Follow-up Note (Signed)
Anesthesia QCDR form completed.        

## 2018-08-30 NOTE — Anesthesia Postprocedure Evaluation (Signed)
Anesthesia Post Note  Patient: Ian Shaffer  Procedure(s) Performed: ESOPHAGOGASTRODUODENOSCOPY (EGD) (N/A )  Patient location during evaluation: Endoscopy Anesthesia Type: General Level of consciousness: awake and alert Pain management: pain level controlled Vital Signs Assessment: post-procedure vital signs reviewed and stable Respiratory status: spontaneous breathing, nonlabored ventilation, respiratory function stable and patient connected to nasal cannula oxygen Cardiovascular status: blood pressure returned to baseline and stable Postop Assessment: no apparent nausea or vomiting Anesthetic complications: no     Last Vitals:  Vitals:   08/30/18 1222 08/30/18 1253  BP: 135/77 123/86  Pulse: 75 74  Resp: (!) 22 18  Temp:  36.7 C  SpO2: 100% 98%    Last Pain:  Vitals:   08/30/18 1253  TempSrc: Oral  PainSc:                  Martha Clan

## 2018-08-30 NOTE — Op Note (Signed)
[facility] Patient Name: Ian Shaffer Procedure Date: 08/30/2018 11:38 AM MRN: [id number] Account #: [id number] Date of Birth: [birth year] Admit Type: Outpatient Age: 72 Room: [facility] ENDO ROOM 4 Gender: Male Note Status: Finalized Procedure:            Colonoscopy Indications:          Melena Providers:            Jonathon Bellows MD, MD Referring MD:         Caprice Renshaw MD (Referring MD) Medicines:            Monitored Anesthesia Care Complications:        No immediate complications. Procedure:            Pre-Anesthesia Assessment:                       - Prior to the procedure, a History and Physical was                        performed, and patient medications, allergies and                        sensitivities were reviewed. The patient's tolerance of                        previous anesthesia was reviewed.                       - The risks and benefits of the procedure and the                        sedation options and risks were discussed with the                        patient. All questions were answered and informed                        consent was obtained.                       - ASA Grade Assessment: II - A patient with mild                        systemic disease.                       After obtaining informed consent, the colonoscope was                        passed under direct vision. Throughout the procedure,                        the patient's blood pressure, pulse, and oxygen                        saturations were monitored continuously. The                        Colonoscope was introduced through the anus and                        advanced to  the the cecum, identified by the                        appendiceal orifice, IC valve and transillumination.                        The colonoscopy was performed with ease. The patient                        tolerated the procedure well. The quality of the bowel    preparation was poor. Findings:      The perianal and digital rectal examinations were normal.      A moderate amount of semi-solid stool was found in the entire colon.      The exam was otherwise without abnormality on direct and retroflexion       views. Impression:           - Preparation of the colon was poor.                       - Stool in the entire examined colon.                       - The examination was otherwise normal on direct and                        retroflexion views.                       - No specimens collected. Recommendation:       - Return patient to hospital ward for ongoing care.                       - Advance diet as tolerated.                       - Continue present medications.                       - 1. Home today- no active bleed. Brown stool in colon                       2. F/u with Dr Bonna Gains in outpatient                       3. PRep inadequate to screen for colon polyps but                        adequate to r/o active GI bleed Procedure Code(s):    --- Professional ---                       [id number], Colonoscopy, flexible; diagnostic, including                        collection of specimen(s) by brushing or washing, when                        performed (separate procedure) Diagnosis Code(s):    --- Professional ---                       K92.1,  Melena (includes Hematochezia) CPT copyright 2017 American Medical Association. All rights reserved. The codes documented in this report are preliminary and upon coder review may  be revised to meet current compliance requirements. Jonathon Bellows, MD Jonathon Bellows MD, MD 08/30/2018 12:01:21 PM This report has been signed electronically. Number of Addenda: 0 Note Initiated On: 08/30/2018 11:38 AM Scope Withdrawal Time: 0 hours 9 minutes 18 seconds  Total Procedure Duration: 0 hours 12 minutes 54 seconds       [facility]

## 2018-08-30 NOTE — H&P (Signed)
Jonathon Bellows, MD [address], [place], [place], [place], [address] [address], [place], [place], [place], [address] Phone: [phone]  Fax: [phone]  Primary Care Physician:  Derinda Late, MD   Pre-Procedure History & Physical: HPI:  Ian Shaffer is a 72 y.o. male is here for an colonoscopy.   Past Medical History:  Diagnosis Date  . Benign essential hypertension 05/31/2014  . Cancer ([place]) 06/2017   right lung   . Cerebral infarction ([place]) 05/31/2014  . Cerebrovascular disease 05/31/2014  . CKD (chronic kidney disease) stage 3, GFR 30-59 ml/min ([facility]) 02/23/2016  . CVA (cerebral infarction)   . Diabetes mellitus without complication ([place])   . Difficult intubation   . DVT (deep venous thrombosis) ([place])   . Esophageal reflux 05/31/2014  . H/O: CVA (cerebrovascular accident)   . Hyperlipidemia   . Hypertension   . ICAO (internal carotid artery occlusion) March 01, 2014   Left  . 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 Pleural effusion 04/23/2017  . Stroke [facility]) April, 4,2015  . Type 2 diabetes mellitus with peripheral neuropathy ([place]) 02/23/2016  . Weakness     Past Surgical History:  Procedure Laterality Date  . CHEST TUBE INSERTION N/A 06/25/2017   Procedure: [id number] CATHETER INSERTION;  Surgeon: Nestor Lewandowsky, MD;  Location: [facility] ORS;  Service: Thoracic;  Laterality: N/A;  . CHEST TUBE INSERTION Right 08/07/2017   Procedure: REMOVAL OF PLEUR X CATHETER;  Surgeon: Nestor Lewandowsky, MD;  Location: [facility] ORS;  Service: General;  Laterality: Right;  . ESOPHAGOGASTRODUODENOSCOPY N/A 08/28/2018   Procedure: ESOPHAGOGASTRODUODENOSCOPY (EGD);  Surgeon: Virgel Manifold, MD;  Location[facility] ENDOSCOPY;  Service: Endoscopy;  Laterality: N/A;  . NASAL SINUS SURGERY  2000  . TONSILLECTOMY    . VIDEO ASSISTED THORACOSCOPY Right 06/25/2017   Procedure: VIDEO ASSISTED THORACOSCOPY WITH TALC PLEURODESIS, CHEST TUBE INSERTION;  Surgeon: Nestor Lewandowsky, MD;  Location: [facility] ORS;  Service: Thoracic;  Laterality: Right;    Prior to Admission medications   Medication Sig Start Date End Date Taking? Authorizing Provider  acetaminophen (TYLENOL) 325 MG tablet Take 2 tablets (650 mg total) by mouth every 6 (six) hours as needed for mild pain (or Fever >/= 101). 05/22/18  Yes Wieting, Richard, MD  amLODipine (NORVASC) 5 MG tablet Take 1 tablet by mouth daily. 05/27/18  Yes [provider]  aspirin EC 81 MG tablet Take 81 mg by mouth daily.   Yes [provider]  fluticasone (FLONASE) 50 MCG/ACT nasal spray Place 1 spray into both nostrils daily at 2 PM.    Yes [provider]  liraglutide (VICTOZA) 18 MG/3ML SOPN Inject 1.2 mg into the skin daily.   Yes [provider]  lisinopril (PRINIVIL,ZESTRIL) 10 MG tablet Take 10 mg by mouth daily.   Yes [provider]  metoprolol succinate (TOPROL-XL) 25 MG 24 hr tablet Take 25 mg by mouth daily.   Yes [provider]  Multiple Vitamins-Minerals (CENTRUM SILVER PO) Take 1 tablet by mouth daily.   Yes [provider]  ondansetron (ZOFRAN) 8 MG tablet Take 8 mg by mouth every 8 (eight) hours as needed for nausea or refractory nausea / vomiting.  07/31/17  Yes [provider]  oxyCODONE (OXY IR/ROXICODONE) 5 MG immediate release tablet Take 1 tablet (5 mg total) by mouth every 4 (four) hours as needed for severe pain. 08/11/18  Yes Verlon Au, NP  polyethylene glycol powder (GLYCOLAX/MIRALAX)  powder Take 17 g by mouth daily as needed (for constipation.).   Yes [provider]  pravastatin (PRAVACHOL) 40 MG tablet Take 40 mg by mouth daily.   Yes [provider]  dronabinol (MARINOL) 2.5 MG capsule Take 1 capsule (2.5 mg total) by mouth 2 (two) times daily before a meal. 08/25/18   Verlon Au, NP  montelukast (SINGULAIR) 10 MG tablet Take 1 tablet (10 mg total) by mouth at bedtime. Take 1 tablet-10 mg night prior x 3  days. Patient not taking: Reported on 08/27/2018 06/02/18   Cammie Sickle, MD  pantoprazole (PROTONIX) 20 MG tablet Take 20 mg by mouth 2 (two) times daily.     [provider]  prochlorperazine (COMPAZINE) 10 MG tablet Take 10 mg by mouth every 8 (eight) hours as needed for vomiting.  07/31/17   [provider]  triamcinolone cream (KENALOG) 0.1 % Apply topically 2 (two) times daily. Patient not taking: Reported on 08/27/2018 05/22/18   Loletha Grayer, MD    Allergies as of 08/27/2018 - Review Complete 08/27/2018  Allergen Reaction Noted  . Sulfa antibiotics Swelling 04/25/2014  . Adhesive [tape] Itching and Rash 07/08/2017  . Amoxicillin Nausea Only and Other (See Comments) 05/13/2016  . Other Rash 08/14/2017    Family History  Problem Relation Age of Onset  . Hypertension Mother   . Hypertension Father     Social History   Socioeconomic History  . Marital status: Married    Spouse name: Vaughan Basta  . Number of children: Not on file  . Years of education: Not on file  . Highest education level: Not on file  Occupational History  . Not on file  Social Needs  . Financial resource strain: Not hard at all  . Food insecurity:    Worry: Patient refused    Inability: Patient refused  . Transportation needs:    Medical: Patient refused    Non-medical: Patient refused  Tobacco Use  . Smoking status: Former Smoker    Types: Pipe    Last attempt to quit: 05/07/1974    Years since quitting: 44.3  . Smokeless tobacco: Never Used  . Tobacco comment: pt states he smoked a pipe a long time ago  Substance and Sexual Activity  . Alcohol use: No  . Drug use: No  . Sexual activity: Not Currently  Lifestyle  . Physical activity:    Days per week: Patient refused    Minutes per session: Patient refused  . Stress: Only a little  Relationships  . Social connections:    Talks on phone: Patient refused    Gets together: Patient refused    Attends religious service:  Patient refused    Active member of club or organization: Patient refused    Attends meetings of clubs or organizations: Patient refused    Relationship status: Patient refused  . Intimate partner violence:    Fear of current or ex partner: Patient refused    Emotionally abused: Patient refused    Physically abused: Patient refused    Forced sexual activity: Patient refused  Other Topics Concern  . Not on file  Social History Narrative  . Not on file    Review of Systems: See HPI, otherwise negative ROS  Physical Exam: BP 125/71 (BP Location: Left Arm)   Pulse 80   Temp 98.3 F (36.8 C) (Oral)   Resp 20   Ht 5\' 10"  (1.778 m)   Wt 85 kg   SpO2 95%  BMI 26.90 kg/m  General:   Alert,  pleasant and cooperative in NAD Head:  Normocephalic and atraumatic. Neck:  Supple; no masses or thyromegaly. Lungs:  Clear throughout to auscultation, normal respiratory effort.    Heart:  +S1, +S2, Regular rate and rhythm, No edema. Abdomen:  Soft, nontender and nondistended. Normal bowel sounds, without guarding, and without rebound.   Neurologic:  Alert and  oriented x4;  grossly normal neurologically.  Impression/Plan: Ian Shaffer is here for an colonoscopy to be performed for GI bleed.   Risks, benefits, limitations, and alternatives regarding  colonoscopy have been reviewed with the patient.  Questions have been answered.  All parties agreeable.   Jonathon Bellows, MD  08/30/2018, 11:24 AM

## 2018-08-31 ENCOUNTER — Encounter: Payer: Self-pay | Admitting: Gastroenterology

## 2018-08-31 NOTE — Discharge Summary (Signed)
[facility] [place] NAME: Ian Shaffer    MR#:  [id number]  DATE OF BIRTH:  1946/10/27  DATE OF ADMISSION:  08/27/2018 ADMITTING PHYSICIAN: Hillary Bow, MD  DATE OF DISCHARGE: 08/30/2018  3:48 PM  PRIMARY CARE PHYSICIAN: Derinda Late, MD    ADMISSION DIAGNOSIS:  melena  DISCHARGE DIAGNOSIS:  Active Problems:   Melena   Protein-calorie malnutrition, severe   Gastric polyp   SECONDARY DIAGNOSIS:   Past Medical History:  Diagnosis Date  . Benign essential hypertension 05/31/2014  . Cancer ([place]) 06/2017   right lung   . Cerebral infarction ([place]) 05/31/2014  . Cerebrovascular disease 05/31/2014  . CKD (chronic kidney disease) stage 3, GFR 30-59 ml/min (HCC) 02/23/2016  . CVA (cerebral infarction)   . Diabetes mellitus without complication ([place])   . Difficult intubation   . DVT (deep venous thrombosis) ([place])   . Esophageal reflux 05/31/2014  . H/O: CVA (cerebrovascular accident)   . Hyperlipidemia   . Hypertension   . ICAO (internal carotid artery occlusion) March 01, 2014   Left  . 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 Pleural effusion 04/23/2017  . Stroke [facility]) April, 4,2015  . Type 2 diabetes mellitus with peripheral neuropathy ([place]) 02/23/2016  . Weakness     HOSPITAL COURSE:   72 year old male with past medical history significant for mesothelioma, CKD, CVA, diabetes, hypertension presents to hospital secondary to 10-day history of melena and dropping hemoglobin  1.  Acute on chronic anemia-due to a GI bleed. - Patient was on aspirin and Plavix and both of them were held.  Patient was seen by gastroenterology underwent a upper GI endoscopy which showed no evidence of peptic ulcer disease or acute bleeding. - Patient underwent colonoscopy which showed no evidence of acute bleeding.  Initially patient could not tolerate the prep but after he finished a different prep his colonoscopy did not show any  evidence of melanotic stools or bleeding. -GI is recommending outpatient capsule endoscopy.  Patient will resume his aspirin, hold his Plavix for now and follow-up with gastroenterology for outpatient capsule endoscopy. -Patient will continue his Protonix twice daily.  Hemoglobin is stable on time of discharge with no evidence of acute bleeding.  2.  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  Most recent imaging with stable lung nodules and intrathoracic lesion but worsening liver mass. - Continue to follow-up with oncology as outpatient  3.  History of CVA- no residual deficits.   - cont. ASA and hold plavix for now until sees GI as outpatient and Capsule Endoscopy is done.   4.  Diabetes mellitus- pt. Will resume his Victoza.  While in hospital pt. Was on SSI.   5.  Hypertension- pt. Will cont. Toprol, Norvasc, Lisinopril.  DISCHARGE CONDITIONS:   Stable  CONSULTS OBTAINED:  Treatment Team:  Virgel Manifold, MD  DRUG ALLERGIES:   Allergies  Allergen Reactions  . Sulfa Antibiotics Swelling    Facial swelling No tongue or lips swelling, no difficulty breathing.  . Adhesive [Tape] Itching and Rash  . Amoxicillin Nausea Only and Other (See Comments)    Has patient had a PCN reaction causing immediate rash, facial/tongue/throat swelling, SOB or lightheadedness with hypotension: No Has patient had a PCN reaction causing severe rash involving mucus membranes or skin necrosis: No Has patient had a PCN reaction that required hospitalization: No Has patient had a PCN reaction  occurring within the last 10 years: Yes If all of the above answers are "NO", then may proceed with Cephalosporin use.   . Other Rash    Surgical tape    DISCHARGE MEDICATIONS:   Allergies as of 08/30/2018      Reactions   Sulfa Antibiotics Swelling   Facial swelling No tongue or lips swelling, no difficulty breathing.   Adhesive [tape]  Itching, Rash   Amoxicillin Nausea Only, Other (See Comments)   Has patient had a PCN reaction causing immediate rash, facial/tongue/throat swelling, SOB or lightheadedness with hypotension: No Has patient had a PCN reaction causing severe rash involving mucus membranes or skin necrosis: No Has patient had a PCN reaction that required hospitalization: No Has patient had a PCN reaction occurring within the last 10 years: Yes If all of the above answers are "NO", then may proceed with Cephalosporin use.   Other Rash   Surgical tape      Medication List    STOP taking these medications   montelukast 10 MG tablet Commonly known as:  SINGULAIR   triamcinolone cream 0.1 % Commonly known as:  KENALOG     TAKE these medications   acetaminophen 325 MG tablet Commonly known as:  TYLENOL Take 2 tablets (650 mg total) by mouth every 6 (six) hours as needed for mild pain (or Fever >/= 101).   amLODipine 5 MG tablet Commonly known as:  NORVASC Take 1 tablet by mouth daily.   aspirin EC 81 MG tablet Take 81 mg by mouth daily.   CENTRUM SILVER PO Take 1 tablet by mouth daily.   dronabinol 2.5 MG capsule Commonly known as:  MARINOL Take 1 capsule (2.5 mg total) by mouth 2 (two) times daily before a meal.   fluticasone 50 MCG/ACT nasal spray Commonly known as:  FLONASE Place 1 spray into both nostrils daily at 2 PM.   lisinopril 10 MG tablet Commonly known as:  PRINIVIL,ZESTRIL Take 10 mg by mouth daily.   metoprolol succinate 25 MG 24 hr tablet Commonly known as:  TOPROL-XL Take 25 mg by mouth daily.   ondansetron 8 MG tablet Commonly known as:  ZOFRAN Take 8 mg by mouth every 8 (eight) hours as needed for nausea or refractory nausea / vomiting.   oxyCODONE 5 MG immediate release tablet Commonly known as:  Oxy IR/ROXICODONE Take 1 tablet (5 mg total) by mouth every 4 (four) hours as needed for severe pain.   pantoprazole 40 MG tablet Commonly known as:  PROTONIX Take 1  tablet (40 mg total) by mouth 2 (two) times daily. What changed:    medication strength  how much to take   polyethylene glycol powder powder Commonly known as:  GLYCOLAX/MIRALAX Take 17 g by mouth daily as needed (for constipation.).   pravastatin 40 MG tablet Commonly known as:  PRAVACHOL Take 40 mg by mouth daily.   prochlorperazine 10 MG tablet Commonly known as:  COMPAZINE Take 10 mg by mouth every 8 (eight) hours as needed for vomiting.   VICTOZA 18 MG/3ML Sopn Generic drug:  liraglutide Inject 1.2 mg into the skin daily.         DISCHARGE INSTRUCTIONS:   DIET:  Cardiac diet and Diabetic diet  DISCHARGE CONDITION:  Stable  ACTIVITY:  Activity as tolerated  OXYGEN:  Home Oxygen: No.   Oxygen Delivery: room air  DISCHARGE LOCATION:  home   If you experience worsening of your admission symptoms, develop shortness of breath, life threatening emergency,  suicidal or homicidal thoughts you must seek medical attention immediately by calling 911 or calling your MD immediately  if symptoms less severe.  You Must read complete instructions/literature along with all the possible adverse reactions/side effects for all the Medicines you take and that have been prescribed to you. Take any new Medicines after you have completely understood and accpet all the possible adverse reactions/side effects.   Please note  You were cared for by a hospitalist during your hospital stay. If you have any questions about your discharge medications or the care you received while you were in the hospital after you are discharged, you can call the unit and asked to speak with the hospitalist on call if the hospitalist that took care of you is not available. Once you are discharged, your primary care physician will handle any further medical issues. Please note that NO REFILLS for any discharge medications will be authorized once you are discharged, as it is imperative that you return to your  primary care physician (or establish a relationship with a primary care physician if you do not have one) for your aftercare needs so that they can reassess your need for medications and monitor your lab values.    DATA REVIEW:   CBC Recent Labs  Lab 08/29/18 0430 08/30/18 1033  WBC 10.8*  --   HGB 7.8* 7.5*  HCT 24.0*  --   PLT 319  --     Chemistries  Recent Labs  Lab 08/27/18 1238  08/29/18 0430  NA 139   < > 139  K 4.5   < > 4.5  CL 103   < > 103  CO2 25   < > 28  GLUCOSE 129*   < > 139*  BUN 26*   < > 21  CREATININE 1.35*   < > 1.57*  CALCIUM 9.8   < > 8.8*  AST 37  --   --   ALT 22  --   --   ALKPHOS 300*  --   --   BILITOT 0.5  --   --    < > = values in this interval not displayed.    Cardiac Enzymes No results for input(s): TROPONINI in the last 168 hours.  Microbiology Results  Results for orders placed or performed during the hospital encounter of 08/27/18  MRSA PCR Screening     Status: None   Collection Time: 08/27/18  3:03 PM  Result Value Ref Range Status   MRSA by PCR NEGATIVE NEGATIVE Final    Comment:        The GeneXpert MRSA Assay (FDA approved for NASAL specimens only), is one component of a comprehensive MRSA colonization surveillance program. It is not intended to diagnose MRSA infection nor to guide or monitor treatment for MRSA infections. Performed at [facility], [address] [place], [place] [address]   CULTURE, BLOOD (ROUTINE X 2) w Reflex to ID Panel     Status: None (Preliminary result)   Collection Time: 08/28/18  4:53 PM  Result Value Ref Range Status   Specimen Description BLOOD R ARM  Final   Special Requests   Final    BOTTLES DRAWN AEROBIC AND ANAEROBIC Blood Culture results may not be optimal due to an inadequate volume of blood received in culture bottles   Culture   Final    NO GROWTH 3 DAYS Performed at [facility], [address] [place], [place] [address]    Report Status  PENDING   Incomplete  CULTURE, BLOOD (ROUTINE X 2) w Reflex to ID Panel     Status: None (Preliminary result)   Collection Time: 08/28/18  6:26 PM  Result Value Ref Range Status   Specimen Description BLOOD R HAND  Final   Special Requests   Final    BOTTLES DRAWN AEROBIC AND ANAEROBIC Blood Culture adequate volume   Culture   Final    NO GROWTH 3 DAYS Performed at [facility], [address] [place], [place] [address]    Report Status PENDING  Incomplete  Urine Culture     Status: None   Collection Time: 08/28/18 10:06 PM  Result Value Ref Range Status   Specimen Description   Final    URINE, RANDOM Performed at [facility], [address] [place], [place] [address]    Special Requests   Final    NONE Performed at [facility], [address], [place], [place] [address]    Culture   Final    NO GROWTH Performed at [facility], [place] [address] [place], [place] [address]    Report Status 08/30/2018 FINAL  Final    RADIOLOGY:  No results found.    Management plans discussed with the patient, family and they are in agreement.  CODE STATUS:  Code Status History    Date Active Date Inactive Code Status Order ID Comments User Context   08/27/2018 1457 08/30/2018 1857 Full Code [id number]  SalaryAvel Peace, MD Inpatient   TOTAL TIME TAKING CARE OF THIS PATIENT: 40 minutes.    Henreitta Leber M.D on 08/31/2018 at 3:49 PM  Between 7am to 6pm - Pager - [phone]  After 6pm go to [email or web address] - Proofreader  [facility]  Office  [phone]  CC: Primary care physician; Derinda Late, MD

## 2018-09-02 ENCOUNTER — Other Ambulatory Visit: Payer: Self-pay

## 2018-09-02 ENCOUNTER — Inpatient Hospital Stay (HOSPITAL_BASED_OUTPATIENT_CLINIC_OR_DEPARTMENT_OTHER): Payer: Medicare Other | Admitting: Internal Medicine

## 2018-09-02 ENCOUNTER — Inpatient Hospital Stay: Payer: Medicare Other

## 2018-09-02 ENCOUNTER — Encounter: Payer: Self-pay | Admitting: Internal Medicine

## 2018-09-02 VITALS — BP 133/78 | HR 102 | Temp 97.8°F | Resp 20 | Ht 70.0 in | Wt 181.0 lb

## 2018-09-02 DIAGNOSIS — Z791 Long term (current) use of non-steroidal anti-inflammatories (NSAID): Secondary | ICD-10-CM | POA: Diagnosis not present

## 2018-09-02 DIAGNOSIS — E1122 Type 2 diabetes mellitus with diabetic chronic kidney disease: Secondary | ICD-10-CM | POA: Diagnosis not present

## 2018-09-02 DIAGNOSIS — K219 Gastro-esophageal reflux disease without esophagitis: Secondary | ICD-10-CM | POA: Diagnosis not present

## 2018-09-02 DIAGNOSIS — Z8711 Personal history of peptic ulcer disease: Secondary | ICD-10-CM | POA: Diagnosis not present

## 2018-09-02 DIAGNOSIS — Z8673 Personal history of transient ischemic attack (TIA), and cerebral infarction without residual deficits: Secondary | ICD-10-CM

## 2018-09-02 DIAGNOSIS — D649 Anemia, unspecified: Secondary | ICD-10-CM | POA: Diagnosis not present

## 2018-09-02 DIAGNOSIS — N189 Chronic kidney disease, unspecified: Secondary | ICD-10-CM | POA: Diagnosis not present

## 2018-09-02 DIAGNOSIS — C459 Mesothelioma, unspecified: Secondary | ICD-10-CM

## 2018-09-02 DIAGNOSIS — F5089 Other specified eating disorder: Secondary | ICD-10-CM | POA: Diagnosis not present

## 2018-09-02 DIAGNOSIS — Z9221 Personal history of antineoplastic chemotherapy: Secondary | ICD-10-CM

## 2018-09-02 DIAGNOSIS — G893 Neoplasm related pain (acute) (chronic): Secondary | ICD-10-CM

## 2018-09-02 DIAGNOSIS — R1011 Right upper quadrant pain: Secondary | ICD-10-CM | POA: Diagnosis not present

## 2018-09-02 DIAGNOSIS — R530 Neoplastic (malignant) related fatigue: Secondary | ICD-10-CM | POA: Diagnosis not present

## 2018-09-02 DIAGNOSIS — Z79899 Other long term (current) drug therapy: Secondary | ICD-10-CM | POA: Diagnosis not present

## 2018-09-02 DIAGNOSIS — I951 Orthostatic hypotension: Secondary | ICD-10-CM | POA: Diagnosis not present

## 2018-09-02 DIAGNOSIS — E1142 Type 2 diabetes mellitus with diabetic polyneuropathy: Secondary | ICD-10-CM | POA: Diagnosis not present

## 2018-09-02 DIAGNOSIS — Z7982 Long term (current) use of aspirin: Secondary | ICD-10-CM

## 2018-09-02 DIAGNOSIS — Z87891 Personal history of nicotine dependence: Secondary | ICD-10-CM | POA: Diagnosis not present

## 2018-09-02 DIAGNOSIS — C787 Secondary malignant neoplasm of liver and intrahepatic bile duct: Secondary | ICD-10-CM

## 2018-09-02 DIAGNOSIS — I129 Hypertensive chronic kidney disease with stage 1 through stage 4 chronic kidney disease, or unspecified chronic kidney disease: Secondary | ICD-10-CM | POA: Diagnosis not present

## 2018-09-02 DIAGNOSIS — Z7902 Long term (current) use of antithrombotics/antiplatelets: Secondary | ICD-10-CM | POA: Diagnosis not present

## 2018-09-02 DIAGNOSIS — K921 Melena: Secondary | ICD-10-CM | POA: Diagnosis present

## 2018-09-02 DIAGNOSIS — N183 Chronic kidney disease, stage 3 (moderate): Secondary | ICD-10-CM

## 2018-09-02 LAB — CBC WITH DIFFERENTIAL/PLATELET
Abs Immature Granulocytes: 0.06 10*3/uL (ref 0.00–0.07)
BASOS ABS: 0 10*3/uL (ref 0.0–0.1)
BASOS PCT: 0 %
EOS ABS: 0.1 10*3/uL (ref 0.0–0.5)
EOS PCT: 1 %
HCT: 25.9 % — ABNORMAL LOW (ref 39.0–52.0)
Hemoglobin: 8.1 g/dL — ABNORMAL LOW (ref 13.0–17.0)
Immature Granulocytes: 1 %
LYMPHS PCT: 7 %
Lymphs Abs: 0.9 10*3/uL (ref 0.7–4.0)
MCH: 27.8 pg (ref 26.0–34.0)
MCHC: 31.3 g/dL (ref 30.0–36.0)
MCV: 89 fL (ref 80.0–100.0)
MONO ABS: 0.7 10*3/uL (ref 0.1–1.0)
Monocytes Relative: 6 %
NRBC: 0 % (ref 0.0–0.2)
Neutro Abs: 10.5 10*3/uL — ABNORMAL HIGH (ref 1.7–7.7)
Neutrophils Relative %: 85 %
PLATELETS: 314 10*3/uL (ref 150–400)
RBC: 2.91 MIL/uL — AB (ref 4.22–5.81)
RDW: 15.9 % — AB (ref 11.5–15.5)
WBC: 12.2 10*3/uL — AB (ref 4.0–10.5)

## 2018-09-02 LAB — IRON AND TIBC
IRON: 15 ug/dL — AB (ref 45–182)
SATURATION RATIOS: 7 % — AB (ref 17.9–39.5)
TIBC: 222 ug/dL — AB (ref 250–450)
UIBC: 207 ug/dL

## 2018-09-02 LAB — COMPREHENSIVE METABOLIC PANEL
ALBUMIN: 3.1 g/dL — AB (ref 3.5–5.0)
ALT: 29 U/L (ref 0–44)
ANION GAP: 10 (ref 5–15)
AST: 39 U/L (ref 15–41)
Alkaline Phosphatase: 323 U/L — ABNORMAL HIGH (ref 38–126)
BUN: 21 mg/dL (ref 8–23)
CALCIUM: 9.2 mg/dL (ref 8.9–10.3)
CO2: 26 mmol/L (ref 22–32)
Chloride: 101 mmol/L (ref 98–111)
Creatinine, Ser: 1.4 mg/dL — ABNORMAL HIGH (ref 0.61–1.24)
GFR calc Af Amer: 56 mL/min — ABNORMAL LOW (ref >60)
GFR calc non Af Amer: 48 mL/min — ABNORMAL LOW (ref >60)
GLUCOSE: 216 mg/dL — AB (ref 70–99)
POTASSIUM: 4.5 mmol/L (ref 3.5–5.1)
SODIUM: 137 mmol/L (ref 135–145)
Total Bilirubin: 0.5 mg/dL (ref 0.3–1.2)
Total Protein: 6.7 g/dL (ref 6.5–8.1)

## 2018-09-02 LAB — CULTURE, BLOOD (ROUTINE X 2)
CULTURE: NO GROWTH
Culture: NO GROWTH
Special Requests: ADEQUATE

## 2018-09-02 LAB — FERRITIN: Ferritin: 527 ng/mL — ABNORMAL HIGH (ref 24–336)

## 2018-09-02 NOTE — Assessment & Plan Note (Addendum)
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   # Discussed regarding starting on Vinorelbine IV weekly; unfortunately-response rates are minimal.  Patient will need Mediport.  Discussed given limited benefit with further chemotherapy, it was agreed to hold further chemotherapy.  Recommend palliative care evaluation.  #Worsening anemia-hemoglobin hemoglobin 8.1.  Recent EGD colonoscopy negative [given melena]; awaiting GI evaluation for capsule study.  Iron studies pending.   # pain- tramdaol as needed.   # Reflux stable.  # CKD- stage III creat-STABLE.   # Prognosis unfortunately poor.   DISPOSITION:   iron studies/ferritin today labs  Refer to [facility]  Follow up in 3 weeks/labs-cbc/cmp/HOld tube-MD-Dr.B  Addendum iron studies: Low iron saturation; elevated ferritin-highly suggestive of anemia of chronic disease from malignancy; rather than iron deficiency.  Cc; Dr.Baboff.

## 2018-09-03 ENCOUNTER — Encounter: Payer: Self-pay | Admitting: Gastroenterology

## 2018-09-03 ENCOUNTER — Ambulatory Visit (INDEPENDENT_AMBULATORY_CARE_PROVIDER_SITE_OTHER): Payer: Medicare Other | Admitting: Gastroenterology

## 2018-09-03 ENCOUNTER — Other Ambulatory Visit: Payer: Self-pay

## 2018-09-03 VITALS — BP 129/77 | Ht 70.0 in | Wt 181.0 lb

## 2018-09-03 DIAGNOSIS — K921 Melena: Secondary | ICD-10-CM

## 2018-09-03 DIAGNOSIS — D649 Anemia, unspecified: Secondary | ICD-10-CM

## 2018-09-03 NOTE — Progress Notes (Signed)
Vonda Antigua, MD [address]  [place]  [place], [place] [address]  Main: [phone]  Fax: [phone]   Primary Care Physician: Derinda Late, MD  Primary Gastroenterologist:  Dr. Vonda Antigua  Chief Complaint  Patient presents with  . Follow-up    Follow up EGD    HPI: Dustine Stickler is a 72 y.o. male here for follow-up of melena and posthospitalization.  Patient underwent EGD and colonoscopy during his hospitalization for melena.  EGD was negative for any sources of bleeding.  Benign-appearing gastric polyps were seen, but these were not biopsies due to melena on that admission.  He then underwent colonoscopy which showed poor prep but was otherwise normal and as per Dr. Georgeann Oppenheim note "PRep inadequate to screen for colon polyps but adequate to r/o active GI bleed".  Patient reports brown stools at home.  No abdominal pain.  No nausea or vomiting.  No weight loss.  No loss of appetite.  No melena or hematochezia since discharge.  Current Outpatient Medications  Medication Sig Dispense Refill  . acetaminophen (TYLENOL) 325 MG tablet Take 2 tablets (650 mg total) by mouth every 6 (six) hours as needed for mild pain (or Fever >/= 101).    Marland Kitchen amLODipine (NORVASC) 5 MG tablet Take 1 tablet by mouth daily.    Marland Kitchen aspirin EC 81 MG tablet Take 81 mg by mouth daily.    Marland Kitchen dronabinol (MARINOL) 2.5 MG capsule Take 1 capsule (2.5 mg total) by mouth 2 (two) times daily before a meal. 60 capsule 0  . fluticasone (FLONASE) 50 MCG/ACT nasal spray Place 1 spray into both nostrils daily at 2 PM.     . liraglutide (VICTOZA) 18 MG/3ML SOPN Inject 1.2 mg into the skin daily.    Marland Kitchen lisinopril (PRINIVIL,ZESTRIL) 10 MG tablet Take 10 mg by mouth daily.    . metoprolol succinate (TOPROL-XL) 25 MG 24 hr tablet Take 25 mg by mouth daily.    . Multiple Vitamins-Minerals (CENTRUM SILVER PO) Take 1 tablet by mouth daily.    . ondansetron (ZOFRAN) 8 MG tablet Take 8 mg by mouth every 8  (eight) hours as needed for nausea or refractory nausea / vomiting.     Marland Kitchen oxyCODONE (OXY IR/ROXICODONE) 5 MG immediate release tablet Take 1 tablet (5 mg total) by mouth every 4 (four) hours as needed for severe pain. 30 tablet 0  . pantoprazole (PROTONIX) 40 MG tablet Take 1 tablet (40 mg total) by mouth 2 (two) times daily. 60 tablet 1  . polyethylene glycol powder (GLYCOLAX/MIRALAX) powder Take 17 g by mouth daily as needed (for constipation.).    Marland Kitchen pravastatin (PRAVACHOL) 40 MG tablet Take 40 mg by mouth daily.    . prochlorperazine (COMPAZINE) 10 MG tablet Take 10 mg by mouth every 8 (eight) hours as needed for vomiting.      No current facility-administered medications for this visit.     Allergies as of 09/03/2018 - Review Complete 09/03/2018  Allergen Reaction Noted  . Sulfa antibiotics Swelling 04/25/2014  . Adhesive [tape] Itching and Rash 07/08/2017  . Amoxicillin Nausea Only and Other (See Comments) 05/13/2016  . Other Rash 08/14/2017    ROS:  General: Negative for anorexia, weight loss, fever, chills, fatigue, weakness. ENT: Negative for hoarseness, difficulty swallowing , nasal congestion. CV: Negative for chest pain, angina, palpitations, dyspnea on exertion, peripheral edema.  Respiratory: Negative for dyspnea at rest, dyspnea on exertion, cough, sputum, wheezing.  GI: See history of present illness.  GU:  Negative for dysuria, hematuria, urinary incontinence, urinary frequency, nocturnal urination.  Endo: Negative for unusual weight change.    Physical Examination:   BP 129/77   Ht 5\' 10"  (1.778 m)   Wt 181 lb (82.1 kg)   BMI 25.97 kg/m   General: Well-nourished, well-developed in no acute distress.  Eyes: No icterus. Conjunctivae pink. Mouth: Oropharyngeal mucosa moist and pink , no lesions erythema or exudate. Neck: Supple, Trachea midline Abdomen: Bowel sounds are normal, nontender, nondistended, no hepatosplenomegaly or masses, no abdominal bruits or hernia  , no rebound or guarding.   Extremities: No lower extremity edema. No clubbing or deformities. Neuro: Alert and oriented x 3.  Grossly intact. Skin: Warm and dry, no jaundice.   Psych: Alert and cooperative, normal mood and affect.   Labs: CMP     Component Value Date/Time   NA 137 09/02/2018 1324   K 4.5 09/02/2018 1324   CL 101 09/02/2018 1324   CO2 26 09/02/2018 1324   GLUCOSE 216 (H) 09/02/2018 1324   BUN 21 09/02/2018 1324   CREATININE 1.40 (H) 09/02/2018 1324   CALCIUM 9.2 09/02/2018 1324   PROT 6.7 09/02/2018 1324   ALBUMIN 3.1 (L) 09/02/2018 1324   AST 39 09/02/2018 1324   ALT 29 09/02/2018 1324   ALKPHOS 323 (H) 09/02/2018 1324   BILITOT 0.5 09/02/2018 1324   GFRNONAA 48 (L) 09/02/2018 1324   GFRAA 56 (L) 09/02/2018 1324   Lab Results  Component Value Date   WBC 12.2 (H) 09/02/2018   HGB 8.1 (L) 09/02/2018   HCT 25.9 (L) 09/02/2018   MCV 89.0 09/02/2018   PLT 314 09/02/2018    Imaging Studies: Dg Chest 2 View  Result Date: 08/28/2018 CLINICAL DATA:  Fever today.  History of lung cancer. EXAM: CHEST - 2 VIEW COMPARISON:  Chest x-ray 08/11/2018, chest CT 08/03/2018 FINDINGS: Cardiomediastinal silhouette is normal. Mediastinal contours appear intact. There is no evidence of focal airspace consolidation, pleural effusion or pneumothorax. Interval enlargement of multiple bilateral pulmonary masses. The largest mass in the left lower lung field measures 4.0 cm from prior measurement of 3.6 cm on chest x-ray dated 08/11/2018. Stable volume loss in the right hemithorax. Right peri diaphragmatic mass has also increased in size. Osseous structures are without acute abnormality. Soft tissues are grossly normal. IMPRESSION: Worsening pulmonary metastatic disease. Right pleural thickening versus pleural effusion. Electronically Signed   By: Fidela Salisbury M.D.   On: 08/28/2018 17:30   Dg Chest 2 View  Result Date: 08/11/2018 CLINICAL DATA:  History of mesothelioma, chest  pain and fever EXAM: CHEST - 2 VIEW COMPARISON:  CT chest of 08/03/2018 and chest x-ray of 05/20/2017 FINDINGS: Multiple lung nodules again are noted throughout the lungs consistent with diffuse metastatic involvement of the lungs. The largest nodule at the left lung base currently measures 36 mm in diameter compared to prior measurement of approximately 30 mm on recent CT. This may be some discrepancy in matter measurement, but the lesions have certainly not diminished in size. A tiny right pleural effusion cannot be excluded. Mediastinal and hilar contours are unchanged and heart size is stable. No bony abnormality is noted other than degenerative change throughout the thoracic spine. IMPRESSION: Diffuse lung metastases again are noted possibly slightly larger. No pneumonia is seen. A tiny right pleural effusion cannot be excluded Electronically Signed   By: Ivar Drape M.D.   On: 08/11/2018 10:00   US Abdomen Limited Ruq  Result Date: 08/11/2018 CLINICAL  DATA:  Upper abdominal pain. History of malignant mesothelioma EXAM: ULTRASOUND ABDOMEN LIMITED RIGHT UPPER QUADRANT COMPARISON:  PET-CT July 09, 2017. Chest CT including portions of the upper abdomen August 03, 2018 FINDINGS: Gallbladder: No gallstones or wall thickening visualized. There is no pericholecystic fluid. No sonographic Murphy sign noted by sonographer. Common bile duct: Diameter: 3 mm. No intrahepatic or extrahepatic biliary duct dilatation. Liver: There are mass lesions in the liver, felt to represent metastatic foci. The largest of these lesions is seen in the anterior segment right lobe of the liver measuring 14.8 x 8.5 x 13.8 cm. A second lesion in the right lobe measures 6.8 x 3.8 x 5.8 cm. A third lesion arising eccentrically from the right lobe of the liver measures 2.2 x 2.0 x 1.9 cm. Within normal limits in parenchymal echogenicity. Portal vein is patent on color Doppler imaging with normal direction of blood flow towards the liver.  Right kidney appears somewhat echogenic. IMPRESSION: 1. Widespread metastatic disease throughout the right lobe of the liver. The larger lesions have a somewhat infiltrative appearance. 2. Right kidney appears somewhat echogenic. Question a degree of medical renal disease. 3. No demonstrable gallbladder pathology or biliary duct dilatation. Electronically Signed   By: Lowella Grip III M.D.   On: 08/11/2018 13:35    Assessment and Plan:   Tarl Cephas is a 72 y.o. y/o male here for posthospitalization follow-up for melena  EGD and colonoscopy unrevealing of etiology of melena Small bowel capsule endoscopy indicated This was discussed with the patient, and risk of procedure, including risks of capsule getting stuck in the small bowel, needing surgery if this occurs was discussed.  Patient agreeable with proceeding. We will schedule.  Possible source could be a small bowel AVMs that caused his symptoms Symptoms are resolved at this time Follow-up with hematology oncology, Dr. Rogue Bussing as you are  Hemoglobin improved to 8.1, checked yesterday compared to 7.5 on discharge  I have discussed alternative options, risks & benefits,  which include, but are not limited to, bleeding, infection, perforation,respiratory complication & drug reaction.  The patient agrees with this plan & written consent will be obtained.    Patient asked to call us or go to the ER immediately if melena or any other alarm symptoms that were discussed with him recurs. he verbalized understanding    Dr Vonda Antigua

## 2018-09-04 NOTE — Progress Notes (Signed)
[facility] [place] [facility] OFFICE PROGRESS NOTE  Patient Care Team: Derinda Late, MD as PCP - General (Family Medicine) Margaretha Sheffield, MD (Otolaryngology) Telford Nab, RN as Registered Nurse Nestor Lewandowsky, MD as Referring Physician (Cardiothoracic Surgery) Cammie Sickle, MD as Consulting Physician (Internal Medicine)  Cancer Staging No matching staging information was found for the patient.   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   # JUNE 2019- GEM; SEP 9th CT scan- Progression;  # SEP 2019- Severe Anemia/malena [EGD/colo-NEG]  # Hx of CVA Right hand; no residual deficits [left sided carotid block; no surgery done]- on Asa/plavix; CKD- stage III; DM-2; ? PN  # MOLECULAR TESTING- PDL-1- 1% [LOW]; [facility]- No actionable**  ----------------------------------------------------   DIAGNOSIS: [AUG 2019 ] ? [name]   STAGE:  IV  ;GOALS: Palliative  CURRENT/MOST RECENT THERAPY-declined vinorelbine     [name], malignant ([place])      INTERVAL HISTORY:  Ian Shaffer 72 y.o.  male pleasant patient above history of likely mesothelioma most recently progressed on third line therapy on gemcitabine is here for follow-up.  Unfortunately patient was  recently admitted the hospital -evaluated for worsening anemia.  She had EGD colonoscopy without any obvious evidence of bleeding.  Patient continues to complain of worsening fatigue.  Denies any blood in stools.  Poor appetite.  Positive for weight loss.  Complains of vague abdominal discomfort in the right upper quadrant  Review of Systems  Constitutional: Positive for malaise/fatigue and weight loss. Negative for chills and diaphoresis.  HENT: Negative for nosebleeds and sore throat.   Eyes: Negative for double vision.  Respiratory: Positive for shortness of breath. Negative for cough, hemoptysis, sputum production and wheezing.   Cardiovascular: Negative for chest pain, palpitations, orthopnea and leg swelling.  Gastrointestinal: Negative for abdominal pain, blood in stool, constipation, diarrhea, melena and vomiting.  Genitourinary: Negative for dysuria, frequency and urgency.  Musculoskeletal: Negative for back pain and joint pain.  Skin: Negative.  Negative for itching and rash.  Neurological: Negative for dizziness, tingling, focal weakness, weakness and headaches.  Endo/Heme/Allergies: Does not bruise/bleed easily.  Psychiatric/Behavioral: Negative for depression. The patient is not nervous/anxious and does not have insomnia.       PAST MEDICAL HISTORY :  Past Medical History:  Diagnosis Date  . Benign essential hypertension 05/31/2014  . Cancer ([place]) 06/2017   right lung   . Cerebral infarction ([place]) 05/31/2014  . Cerebrovascular disease 05/31/2014  . CKD (chronic kidney disease) stage 3, GFR 30-59 ml/min (HCC) 02/23/2016  . CVA (cerebral infarction)   . Diabetes mellitus without complication ([place])   . Difficult intubation   . DVT (deep venous thrombosis) ([place])   . Esophageal reflux 05/31/2014  . H/O: CVA (cerebrovascular accident)   . Hyperlipidemia   . Hypertension   . ICAO (internal carotid artery occlusion) March 01, 2014   Left  .  Localized, primary osteoarthritis of  shoulder region 05/09/2017  . Pleural effusion 04/23/2017  . Stroke [facility]) April, 4,2015  . Type 2 diabetes mellitus with peripheral neuropathy ([place]) 02/23/2016  . Weakness     PAST SURGICAL HISTORY :   Past Surgical History:  Procedure Laterality Date  . CHEST TUBE INSERTION N/A 06/25/2017   Procedure: LFYBOF CATHETER INSERTION;  Surgeon: Nestor Lewandowsky, MD;  Location: [facility] ORS;  Service: Thoracic;  Laterality: N/A;  . CHEST TUBE INSERTION Right 08/07/2017   Procedure: REMOVAL OF PLEUR X CATHETER;  Surgeon: Nestor Lewandowsky, MD;  Location: [facility] ORS;  Service: General;  Laterality: Right;  . COLONOSCOPY WITH PROPOFOL N/A 08/30/2018   Procedure: COLONOSCOPY WITH PROPOFOL;  Surgeon: Jonathon Bellows, MD;  Location: [facility] ENDOSCOPY;  Service: Gastroenterology;  Laterality: N/A;  . ESOPHAGOGASTRODUODENOSCOPY N/A 08/28/2018   Procedure: ESOPHAGOGASTRODUODENOSCOPY (EGD);  Surgeon: Virgel Manifold, MD;  Location: [facility] ENDOSCOPY;  Service: Endoscopy;  Laterality: N/A;  . NASAL SINUS SURGERY  2000  . TONSILLECTOMY    . VIDEO ASSISTED THORACOSCOPY Right 06/25/2017   Procedure: VIDEO ASSISTED THORACOSCOPY WITH TALC PLEURODESIS, CHEST TUBE INSERTION;  Surgeon: Nestor Lewandowsky, MD;  Location: [facility] ORS;  Service: Thoracic;  Laterality: Right;    FAMILY HISTORY :   Family History  Problem Relation Age of Onset  . Hypertension Mother   . Hypertension Father     SOCIAL HISTORY:   Social History   Tobacco Use  . Smoking status: Former Smoker    Types: Pipe    Last attempt to quit: 05/07/1974    Years since quitting: 44.3  . Smokeless tobacco: Never Used  . Tobacco comment: pt states he smoked a pipe a long time ago  Substance Use Topics  . Alcohol use: No  . Drug use: No    ALLERGIES:  is allergic to sulfa antibiotics; adhesive [tape]; amoxicillin; and other.  MEDICATIONS:  Current Outpatient Medications  Medication Sig Dispense Refill  . acetaminophen (TYLENOL) 325 MG tablet Take 2 tablets (650 mg  total) by mouth every 6 (six) hours as needed for mild pain (or Fever >/= 101).    Marland Kitchen amLODipine (NORVASC) 5 MG tablet Take 1 tablet by mouth daily.    Marland Kitchen aspirin EC 81 MG tablet Take 81 mg by mouth daily.    Marland Kitchen dronabinol (MARINOL) 2.5 MG capsule Take 1 capsule (2.5 mg total) by mouth 2 (two) times daily before a meal. 60 capsule 0  . fluticasone (FLONASE) 50 MCG/ACT nasal spray Place 1 spray into both nostrils daily at 2 PM.     . liraglutide (VICTOZA) 18 MG/3ML SOPN Inject 1.2 mg into the skin daily.    Marland Kitchen lisinopril (PRINIVIL,ZESTRIL) 10 MG tablet Take 10 mg by mouth daily.    . metoprolol succinate (TOPROL-XL) 25 MG 24 hr tablet Take 25 mg by mouth daily.    . Multiple Vitamins-Minerals (CENTRUM SILVER PO) Take 1 tablet by mouth daily.    . ondansetron (ZOFRAN) 8 MG tablet Take 8 mg by mouth every 8 (eight) hours as needed for nausea or refractory nausea / vomiting.     Marland Kitchen oxyCODONE (OXY IR/ROXICODONE) 5 MG immediate release tablet Take 1 tablet (5 mg total) by mouth every 4 (four) hours as needed for severe pain. 30 tablet 0  . pantoprazole (PROTONIX) 40 MG tablet Take 1 tablet (40 mg total) by mouth 2 (two) times daily. 60 tablet 1  . polyethylene glycol powder (GLYCOLAX/MIRALAX) powder Take 17 g by mouth daily as needed (for  constipation.).    Marland Kitchen pravastatin (PRAVACHOL) 40 MG tablet Take 40 mg by mouth daily.    . prochlorperazine (COMPAZINE) 10 MG tablet Take 10 mg by mouth every 8 (eight) hours as needed for vomiting.      No current facility-administered medications for this visit.     PHYSICAL EXAMINATION: ECOG PERFORMANCE STATUS: 0 - Asymptomatic  BP 133/78   Pulse (!) 102   Temp 97.8 F (36.6 C) (Tympanic)   Resp 20   Ht 5' 10"  (1.778 m)   Wt 181 lb (82.1 kg)   BMI 25.97 kg/m   Filed Weights   09/02/18 1344  Weight: 181 lb (82.1 kg)    Physical Exam  Constitutional: He is oriented to person, place, and time and well-developed, well-nourished, and in no distress.  He is  walking by himself.  He is accompanied by his wife.  HENT:  Head: Normocephalic and atraumatic.  Mouth/Throat: Oropharynx is clear and moist. No oropharyngeal exudate.  Eyes: Pupils are equal, round, and reactive to light.  Neck: Normal range of motion. Neck supple.  Cardiovascular: Normal rate and regular rhythm.  Pulmonary/Chest: No respiratory distress. He has no wheezes.  Decreased breath sounds bilaterally.  Abdominal: Soft. Bowel sounds are normal. He exhibits no distension and no mass. There is no tenderness. There is no rebound and no guarding.  Musculoskeletal: Normal range of motion. He exhibits no edema or tenderness.  Neurological: He is alert and oriented to person, place, and time.  Skin: Skin is warm.  Psychiatric: Affect normal.       LABORATORY DATA:  I have reviewed the data as listed    Component Value Date/Time   NA 137 09/02/2018 1324   K 4.5 09/02/2018 1324   CL 101 09/02/2018 1324   CO2 26 09/02/2018 1324   GLUCOSE 216 (H) 09/02/2018 1324   BUN 21 09/02/2018 1324   CREATININE 1.40 (H) 09/02/2018 1324   CALCIUM 9.2 09/02/2018 1324   PROT 6.7 09/02/2018 1324   ALBUMIN 3.1 (L) 09/02/2018 1324   AST 39 09/02/2018 1324   ALT 29 09/02/2018 1324   ALKPHOS 323 (H) 09/02/2018 1324   BILITOT 0.5 09/02/2018 1324   GFRNONAA 48 (L) 09/02/2018 1324   GFRAA 56 (L) 09/02/2018 1324    No results found for: SPEP, UPEP  Lab Results  Component Value Date   WBC 12.2 (H) 09/02/2018   NEUTROABS 10.5 (H) 09/02/2018   HGB 8.1 (L) 09/02/2018   HCT 25.9 (L) 09/02/2018   MCV 89.0 09/02/2018   PLT 314 09/02/2018      Chemistry      Component Value Date/Time   NA 137 09/02/2018 1324   K 4.5 09/02/2018 1324   CL 101 09/02/2018 1324   CO2 26 09/02/2018 1324   BUN 21 09/02/2018 1324   CREATININE 1.40 (H) 09/02/2018 1324      Component Value Date/Time   CALCIUM 9.2 09/02/2018 1324   ALKPHOS 323 (H) 09/02/2018 1324   AST 39 09/02/2018 1324   ALT 29 09/02/2018  1324   BILITOT 0.5 09/02/2018 1324       RADIOGRAPHIC STUDIES: I have personally reviewed the radiological images as listed and agreed with the findings in the report. No results found.   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   #  Discussed regarding starting on Vinorelbine IV weekly; unfortunately-response rates are minimal.  Patient will need Mediport.  Discussed given limited benefit with further chemotherapy, it was agreed to hold further chemotherapy.  Recommend palliative care evaluation.  #Worsening anemia-hemoglobin hemoglobin 8.1.  Recent EGD colonoscopy negative [given melena]; awaiting GI evaluation for capsule study.  Iron studies pending.   # pain- tramdaol as needed.   # Reflux stable.  # CKD- stage III creat-STABLE.   # Prognosis unfortunately poor.   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  Cc; Dr.Baboff.    Orders Placed This Encounter  Procedures  . Iron and TIBC    Standing Status:   Future    Number of Occurrences:   1    Standing Expiration Date:   09/03/2019  . Ferritin    Standing Status:   Future    Number of Occurrences:   1    Standing Expiration Date:   09/03/2019  . CBC with Differential/Platelet    Standing Status:   Future    Standing Expiration Date:   09/03/2019  . Comprehensive metabolic panel    Standing Status:   Future    Standing Expiration Date:   09/03/2019  . CBC with Differential    Standing Status:   Future    Standing Expiration  Date:   09/03/2019  . Comprehensive metabolic panel    Standing Status:   Future    Standing Expiration Date:   09/03/2019   All questions were answered. The patient knows to call the clinic with any problems, questions or concerns.      Cammie Sickle, MD 09/04/2018 11:24 PM

## 2018-09-07 ENCOUNTER — Other Ambulatory Visit: Payer: Self-pay

## 2018-09-07 ENCOUNTER — Emergency Department: Payer: Medicare Other

## 2018-09-07 ENCOUNTER — Inpatient Hospital Stay
Admission: EM | Admit: 2018-09-07 | Discharge: 2018-09-10 | DRG: 054 | Disposition: A | Discharge status: Hospice | Payer: Medicare Other | Attending: Internal Medicine | Admitting: Internal Medicine

## 2018-09-07 ENCOUNTER — Inpatient Hospital Stay: Payer: Medicare Other | Admitting: Hospice and Palliative Medicine

## 2018-09-07 DIAGNOSIS — Z79899 Other long term (current) drug therapy: Secondary | ICD-10-CM

## 2018-09-07 DIAGNOSIS — K219 Gastro-esophageal reflux disease without esophagitis: Secondary | ICD-10-CM | POA: Diagnosis present

## 2018-09-07 DIAGNOSIS — Z86718 Personal history of other venous thrombosis and embolism: Secondary | ICD-10-CM

## 2018-09-07 DIAGNOSIS — M19019 Primary osteoarthritis, unspecified shoulder: Secondary | ICD-10-CM | POA: Diagnosis present

## 2018-09-07 DIAGNOSIS — Z888 Allergy status to other drugs, medicaments and biological substances status: Secondary | ICD-10-CM

## 2018-09-07 DIAGNOSIS — Z574 Occupational exposure to toxic agents in agriculture: Secondary | ICD-10-CM

## 2018-09-07 DIAGNOSIS — Z8249 Family history of ischemic heart disease and other diseases of the circulatory system: Secondary | ICD-10-CM

## 2018-09-07 DIAGNOSIS — K5900 Constipation, unspecified: Secondary | ICD-10-CM | POA: Diagnosis not present

## 2018-09-07 DIAGNOSIS — Z8673 Personal history of transient ischemic attack (TIA), and cerebral infarction without residual deficits: Secondary | ICD-10-CM

## 2018-09-07 DIAGNOSIS — M549 Dorsalgia, unspecified: Secondary | ICD-10-CM | POA: Diagnosis present

## 2018-09-07 DIAGNOSIS — Z7982 Long term (current) use of aspirin: Secondary | ICD-10-CM

## 2018-09-07 DIAGNOSIS — E785 Hyperlipidemia, unspecified: Secondary | ICD-10-CM | POA: Diagnosis present

## 2018-09-07 DIAGNOSIS — Z66 Do not resuscitate: Secondary | ICD-10-CM | POA: Diagnosis present

## 2018-09-07 DIAGNOSIS — E872 Acidosis, unspecified: Secondary | ICD-10-CM

## 2018-09-07 DIAGNOSIS — G9340 Encephalopathy, unspecified: Secondary | ICD-10-CM | POA: Diagnosis present

## 2018-09-07 DIAGNOSIS — C7931 Secondary malignant neoplasm of brain: Secondary | ICD-10-CM | POA: Diagnosis present

## 2018-09-07 DIAGNOSIS — Z77090 Contact with and (suspected) exposure to asbestos: Secondary | ICD-10-CM | POA: Diagnosis present

## 2018-09-07 DIAGNOSIS — Z515 Encounter for palliative care: Secondary | ICD-10-CM | POA: Diagnosis not present

## 2018-09-07 DIAGNOSIS — E1165 Type 2 diabetes mellitus with hyperglycemia: Secondary | ICD-10-CM | POA: Diagnosis present

## 2018-09-07 DIAGNOSIS — C787 Secondary malignant neoplasm of liver and intrahepatic bile duct: Secondary | ICD-10-CM | POA: Diagnosis present

## 2018-09-07 DIAGNOSIS — E1122 Type 2 diabetes mellitus with diabetic chronic kidney disease: Secondary | ICD-10-CM | POA: Diagnosis present

## 2018-09-07 DIAGNOSIS — Z79891 Long term (current) use of opiate analgesic: Secondary | ICD-10-CM | POA: Diagnosis not present

## 2018-09-07 DIAGNOSIS — C459 Mesothelioma, unspecified: Secondary | ICD-10-CM

## 2018-09-07 DIAGNOSIS — Z882 Allergy status to sulfonamides status: Secondary | ICD-10-CM

## 2018-09-07 DIAGNOSIS — B379 Candidiasis, unspecified: Secondary | ICD-10-CM | POA: Diagnosis present

## 2018-09-07 DIAGNOSIS — N183 Chronic kidney disease, stage 3 (moderate): Secondary | ICD-10-CM | POA: Diagnosis present

## 2018-09-07 DIAGNOSIS — R569 Unspecified convulsions: Secondary | ICD-10-CM | POA: Diagnosis present

## 2018-09-07 DIAGNOSIS — G936 Cerebral edema: Secondary | ICD-10-CM | POA: Diagnosis present

## 2018-09-07 DIAGNOSIS — Z85118 Personal history of other malignant neoplasm of bronchus and lung: Secondary | ICD-10-CM | POA: Diagnosis not present

## 2018-09-07 DIAGNOSIS — Z91048 Other nonmedicinal substance allergy status: Secondary | ICD-10-CM

## 2018-09-07 LAB — CBC WITH DIFFERENTIAL/PLATELET
Abs Immature Granulocytes: 0.09 10*3/uL — ABNORMAL HIGH (ref 0.00–0.07)
Basophils Absolute: 0.1 10*3/uL (ref 0.0–0.1)
Basophils Relative: 1 %
EOS PCT: 1 %
Eosinophils Absolute: 0.1 10*3/uL (ref 0.0–0.5)
HCT: 28 % — ABNORMAL LOW (ref 39.0–52.0)
Hemoglobin: 8.7 g/dL — ABNORMAL LOW (ref 13.0–17.0)
Immature Granulocytes: 1 %
LYMPHS PCT: 10 %
Lymphs Abs: 1.6 10*3/uL (ref 0.7–4.0)
MCH: 27.5 pg (ref 26.0–34.0)
MCHC: 31.1 g/dL (ref 30.0–36.0)
MCV: 88.6 fL (ref 80.0–100.0)
MONO ABS: 1 10*3/uL (ref 0.1–1.0)
MONOS PCT: 7 %
Neutro Abs: 12.2 10*3/uL — ABNORMAL HIGH (ref 1.7–7.7)
Neutrophils Relative %: 80 %
PLATELETS: 463 10*3/uL — AB (ref 150–400)
RBC: 3.16 MIL/uL — ABNORMAL LOW (ref 4.22–5.81)
RDW: 16.4 % — ABNORMAL HIGH (ref 11.5–15.5)
WBC: 15.1 10*3/uL — ABNORMAL HIGH (ref 4.0–10.5)
nRBC: 0 % (ref 0.0–0.2)

## 2018-09-07 LAB — BLOOD CULTURE ID PANEL (REFLEXED)
ACINETOBACTER BAUMANNII: NOT DETECTED
CANDIDA ALBICANS: NOT DETECTED
Candida glabrata: NOT DETECTED
Candida krusei: NOT DETECTED
Candida parapsilosis: NOT DETECTED
Candida tropicalis: NOT DETECTED
Carbapenem resistance: NOT DETECTED
ENTEROBACTER CLOACAE COMPLEX: NOT DETECTED
Enterobacteriaceae species: NOT DETECTED
Enterococcus species: NOT DETECTED
Escherichia coli: NOT DETECTED
HAEMOPHILUS INFLUENZAE: NOT DETECTED
Klebsiella oxytoca: NOT DETECTED
Klebsiella pneumoniae: NOT DETECTED
LISTERIA MONOCYTOGENES: NOT DETECTED
METHICILLIN RESISTANCE: DETECTED — AB
NEISSERIA MENINGITIDIS: NOT DETECTED
Proteus species: NOT DETECTED
Pseudomonas aeruginosa: NOT DETECTED
STREPTOCOCCUS PNEUMONIAE: NOT DETECTED
STREPTOCOCCUS PYOGENES: NOT DETECTED
STREPTOCOCCUS SPECIES: NOT DETECTED
Serratia marcescens: NOT DETECTED
Staphylococcus aureus (BCID): NOT DETECTED
Staphylococcus species: DETECTED — AB
Streptococcus agalactiae: NOT DETECTED
Vancomycin resistance: NOT DETECTED

## 2018-09-07 LAB — URINALYSIS, ROUTINE W REFLEX MICROSCOPIC
Bilirubin Urine: NEGATIVE
GLUCOSE, UA: 50 mg/dL — AB
HGB URINE DIPSTICK: NEGATIVE
KETONES UR: 5 mg/dL — AB
Leukocytes, UA: NEGATIVE
NITRITE: NEGATIVE
Protein, ur: 100 mg/dL — AB
Specific Gravity, Urine: 1.024 (ref 1.005–1.030)
pH: 5 (ref 5.0–8.0)

## 2018-09-07 LAB — TROPONIN I: Troponin I: 0.03 ng/mL (ref <0.03)

## 2018-09-07 LAB — COMPREHENSIVE METABOLIC PANEL
ALK PHOS: 276 U/L — AB (ref 38–126)
ALT: 22 U/L (ref 0–44)
AST: 37 U/L (ref 15–41)
Albumin: 3.1 g/dL — ABNORMAL LOW (ref 3.5–5.0)
Anion gap: 16 — ABNORMAL HIGH (ref 5–15)
BILIRUBIN TOTAL: 0.5 mg/dL (ref 0.3–1.2)
BUN: 21 mg/dL (ref 8–23)
CALCIUM: 9.4 mg/dL (ref 8.9–10.3)
CO2: 22 mmol/L (ref 22–32)
CREATININE: 1.39 mg/dL — AB (ref 0.61–1.24)
Chloride: 100 mmol/L (ref 98–111)
GFR calc non Af Amer: 48 mL/min — ABNORMAL LOW (ref >60)
GFR, EST AFRICAN AMERICAN: 56 mL/min — AB (ref >60)
Glucose, Bld: 215 mg/dL — ABNORMAL HIGH (ref 70–99)
Potassium: 3.9 mmol/L (ref 3.5–5.1)
Sodium: 138 mmol/L (ref 135–145)
TOTAL PROTEIN: 7.2 g/dL (ref 6.5–8.1)

## 2018-09-07 LAB — AMMONIA: Ammonia: 17 umol/L (ref 9–35)

## 2018-09-07 LAB — PROTIME-INR
INR: 1.18
Prothrombin Time: 14.9 seconds (ref 11.4–15.2)

## 2018-09-07 LAB — PROCALCITONIN: Procalcitonin: 0.36 ng/mL

## 2018-09-07 LAB — LACTIC ACID, PLASMA: LACTIC ACID, VENOUS: 6.3 mmol/L — AB (ref 0.5–1.9)

## 2018-09-07 LAB — LIPASE, BLOOD: Lipase: 22 U/L (ref 11–51)

## 2018-09-07 MED ORDER — LEVETIRACETAM IN NACL 1000 MG/100ML IV SOLN
1000.0000 mg | Freq: Once | INTRAVENOUS | Status: AC
  Administered 2018-09-07: 1000 mg via INTRAVENOUS
  Filled 2018-09-07: qty 100

## 2018-09-07 MED ORDER — ONDANSETRON HCL 4 MG/2ML IJ SOLN: 4.0000 mg | Freq: Four times a day (QID) | INTRAMUSCULAR | Status: DC | PRN

## 2018-09-07 MED ORDER — MORPHINE 100MG IN NS 100ML (1MG/ML) PREMIX INFUSION
10.0000 mg/h | INTRAVENOUS | Status: DC
  Administered 2018-09-07 – 2018-09-08 (×3): 10 mg/h via INTRAVENOUS
  Filled 2018-09-07 (×7): qty 100

## 2018-09-07 MED ORDER — LORAZEPAM 2 MG/ML IJ SOLN: 1.0000 mg | INTRAMUSCULAR | Status: DC | PRN

## 2018-09-07 MED ORDER — SODIUM CHLORIDE 0.9 % IV SOLN
1.0000 g | INTRAVENOUS | Status: DC
  Filled 2018-09-07: qty 10

## 2018-09-07 MED ORDER — ACETAMINOPHEN 650 MG RE SUPP
650.0000 mg | Freq: Once | RECTAL | Status: AC
  Administered 2018-09-07: 650 mg via RECTAL
  Filled 2018-09-07: qty 1

## 2018-09-07 MED ORDER — ALUM & MAG HYDROXIDE-SIMETH 200-200-20 MG/5ML PO SUSP: 30.0000 mL | Freq: Four times a day (QID) | ORAL | Status: DC | PRN

## 2018-09-07 MED ORDER — MORPHINE 100MG IN NS 100ML (1MG/ML) PREMIX INFUSION
10.0000 mg/h | INTRAVENOUS | Status: DC
  Filled 2018-09-07: qty 100

## 2018-09-07 MED ORDER — DEXAMETHASONE SODIUM PHOSPHATE 10 MG/ML IJ SOLN
10.0000 mg | Freq: Once | INTRAMUSCULAR | Status: AC
  Administered 2018-09-07: 10 mg via INTRAVENOUS
  Filled 2018-09-07: qty 1

## 2018-09-07 MED ORDER — ACETAMINOPHEN 325 MG PO TABS: 650.0000 mg | ORAL_TABLET | Freq: Four times a day (QID) | ORAL | Status: DC | PRN

## 2018-09-07 MED ORDER — POLYVINYL ALCOHOL 1.4 % OP SOLN
1.0000 [drp] | Freq: Four times a day (QID) | OPHTHALMIC | Status: DC | PRN
  Filled 2018-09-07: qty 15

## 2018-09-07 MED ORDER — ACETAMINOPHEN 650 MG RE SUPP: 650.0000 mg | Freq: Four times a day (QID) | RECTAL | Status: DC | PRN

## 2018-09-07 MED ORDER — LORAZEPAM 2 MG/ML IJ SOLN
2.0000 mg | Freq: Once | INTRAMUSCULAR | Status: AC
  Administered 2018-09-07: 2 mg via INTRAVENOUS
  Filled 2018-09-07: qty 1

## 2018-09-07 MED ORDER — PANTOPRAZOLE SODIUM 40 MG PO TBEC: 40.0000 mg | DELAYED_RELEASE_TABLET | Freq: Two times a day (BID) | ORAL | Status: DC

## 2018-09-07 MED ORDER — MAGIC MOUTHWASH: 15.0000 mL | Freq: Four times a day (QID) | ORAL | Status: DC | PRN

## 2018-09-07 MED ORDER — HALOPERIDOL LACTATE 5 MG/ML IJ SOLN: 0.5000 mg | INTRAMUSCULAR | Status: DC | PRN

## 2018-09-07 MED ORDER — HYDROMORPHONE HCL 1 MG/ML IJ SOLN
1.0000 mg | Freq: Once | INTRAMUSCULAR | Status: AC
  Administered 2018-09-07: 1 mg via INTRAVENOUS

## 2018-09-07 MED ORDER — SODIUM CHLORIDE 0.9 % IV BOLUS
1000.0000 mL | Freq: Once | INTRAVENOUS | Status: AC
  Administered 2018-09-07: 1000 mL via INTRAVENOUS

## 2018-09-07 MED ORDER — HYDROMORPHONE HCL 1 MG/ML IJ SOLN: 1.0000 mg | INTRAMUSCULAR | Status: DC | PRN

## 2018-09-07 MED ORDER — SENNA 8.6 MG PO TABS: 1.0000 | ORAL_TABLET | Freq: Every evening | ORAL | Status: DC | PRN

## 2018-09-07 MED ORDER — ONDANSETRON 4 MG PO TBDP
4.0000 mg | ORAL_TABLET | Freq: Four times a day (QID) | ORAL | Status: DC | PRN
  Filled 2018-09-07: qty 1

## 2018-09-07 MED ORDER — SODIUM CHLORIDE 0.9 % IV SOLN
2.0000 g | Freq: Once | INTRAVENOUS | Status: AC
  Administered 2018-09-07: 2 g via INTRAVENOUS
  Filled 2018-09-07: qty 20

## 2018-09-07 MED ORDER — METOPROLOL SUCCINATE ER 25 MG PO TB24: 25.0000 mg | ORAL_TABLET | Freq: Every day | ORAL | Status: DC

## 2018-09-07 MED ORDER — LORAZEPAM 2 MG/ML PO CONC: 1.0000 mg | ORAL | Status: DC | PRN

## 2018-09-07 MED ORDER — MAGIC MOUTHWASH W/LIDOCAINE
15.0000 mL | Freq: Four times a day (QID) | ORAL | Status: DC | PRN
  Filled 2018-09-07: qty 15

## 2018-09-07 MED ORDER — HYDROMORPHONE HCL 1 MG/ML IJ SOLN
INTRAMUSCULAR | Status: AC
  Filled 2018-09-07: qty 1

## 2018-09-07 MED ORDER — BISACODYL 10 MG RE SUPP
10.0000 mg | Freq: Every day | RECTAL | Status: DC | PRN
  Filled 2018-09-07: qty 1

## 2018-09-07 MED ORDER — DRONABINOL 2.5 MG PO CAPS: 2.5000 mg | ORAL_CAPSULE | Freq: Two times a day (BID) | ORAL | Status: DC

## 2018-09-07 MED ORDER — LISINOPRIL 10 MG PO TABS: 10.0000 mg | ORAL_TABLET | Freq: Every day | ORAL | Status: DC

## 2018-09-07 MED ORDER — AMLODIPINE BESYLATE 5 MG PO TABS: 5.0000 mg | ORAL_TABLET | Freq: Every day | ORAL | Status: DC

## 2018-09-07 MED ORDER — HALOPERIDOL LACTATE 2 MG/ML PO CONC
0.5000 mg | ORAL | Status: DC | PRN
  Filled 2018-09-07: qty 0.3

## 2018-09-07 MED ORDER — DEXAMETHASONE SODIUM PHOSPHATE 10 MG/ML IJ SOLN
4.0000 mg | Freq: Four times a day (QID) | INTRAMUSCULAR | Status: DC
  Administered 2018-09-07 – 2018-09-09 (×10): 4 mg via INTRAVENOUS
  Filled 2018-09-07 (×10): qty 1

## 2018-09-07 MED ORDER — IPRATROPIUM-ALBUTEROL 0.5-2.5 (3) MG/3ML IN SOLN
3.0000 mL | Freq: Four times a day (QID) | RESPIRATORY_TRACT | Status: DC
  Administered 2018-09-07: 08:00:00 3 mL via RESPIRATORY_TRACT
  Filled 2018-09-07: qty 3

## 2018-09-07 MED ORDER — IPRATROPIUM-ALBUTEROL 0.5-2.5 (3) MG/3ML IN SOLN: 3.0000 mL | Freq: Four times a day (QID) | RESPIRATORY_TRACT | Status: DC | PRN

## 2018-09-07 MED ORDER — HALOPERIDOL 0.5 MG PO TABS
0.5000 mg | ORAL_TABLET | ORAL | Status: DC | PRN
  Filled 2018-09-07: qty 1

## 2018-09-07 MED ORDER — ASPIRIN EC 81 MG PO TBEC: 81.0000 mg | DELAYED_RELEASE_TABLET | Freq: Every day | ORAL | Status: DC

## 2018-09-07 MED ORDER — BIOTENE DRY MOUTH MT LIQD: 15.0000 mL | OROMUCOSAL | Status: DC | PRN

## 2018-09-07 MED ORDER — LORAZEPAM 1 MG PO TABS: 1.0000 mg | ORAL_TABLET | ORAL | Status: DC | PRN

## 2018-09-07 MED ORDER — GLYCOPYRROLATE 1 MG PO TABS
1.0000 mg | ORAL_TABLET | ORAL | Status: DC | PRN
  Filled 2018-09-07: qty 1

## 2018-09-07 MED ORDER — GLYCOPYRROLATE 0.2 MG/ML IJ SOLN
0.2000 mg | INTRAMUSCULAR | Status: DC | PRN
  Filled 2018-09-07: qty 1

## 2018-09-07 MED ORDER — LEVETIRACETAM IN NACL 1000 MG/100ML IV SOLN: 1000.0000 mg | Freq: Two times a day (BID) | INTRAVENOUS | Status: DC

## 2018-09-07 MED ORDER — DIPHENHYDRAMINE HCL 50 MG/ML IJ SOLN
12.5000 mg | INTRAMUSCULAR | Status: DC | PRN
  Filled 2018-09-07: qty 0.25

## 2018-09-07 NOTE — ED Triage Notes (Signed)
Pt arrived via ems with AMS from home. Pt's wife does not remember the LKW. Pt is febrile and had possible seizure. Pt is not coherant but is lethargic.

## 2018-09-07 NOTE — Consult Note (Signed)
[name]  [phone] [phone]   Name: Ian Shaffer Date: 09/07/2018 MRN: [id number]  DOB: 1946-03-02  Patient Care Team: Ian Late, MD as PCP - General (Family Medicine) Ian Sheffield, MD (Otolaryngology) Ian Nab, RN as Registered Nurse Ian Lewandowsky, MD as Referring Physician (Cardiothoracic Surgery) Ian Sickle, MD as Consulting Physician (Internal Medicine)    REASON FOR CONSULTATION: Palliative Care consult requested for this 72 y.o. 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  PMH also notable for diabetes, hypertension, hyperlipidemia, history of CVA, DVT, and CKD.  Patient was admitted on 09/07/2018 with new onset seizures with a prolonged post ictal state.  CT of the head revealed bilateral supratentorial and infratentorial intra-axial masses with vasogenic edema consistent with metastatic disease.  Family opted to pursue comfort measures and patient was admitted for same.  Palliative care was consulted to help ensure patient's comfort and address goals.   SOCIAL HISTORY:    Patient is married.  He lives at home with wife.  He has no children.  There are several siblings who are involved in his care.  ADVANCE DIRECTIVES:  On file.  CODE STATUS: DNR  PAST MEDICAL HISTORY: Past Medical History:  Diagnosis Date  . Benign essential hypertension 05/31/2014  . Cancer ([place]) 06/2017   right lung   . Cerebral infarction ([place]) 05/31/2014  . Cerebrovascular disease 05/31/2014  . CKD (chronic kidney disease) stage 3, GFR 30-59 ml/min (HCC) 02/23/2016  . CVA (cerebral infarction)   . Diabetes mellitus without complication ([place])   . Difficult intubation   . DVT (deep venous thrombosis) ([place])   . Esophageal reflux 05/31/2014  . H/O: CVA (cerebrovascular accident)   . Hyperlipidemia   . Hypertension   . ICAO  (internal carotid artery occlusion) March 01, 2014   Left  . 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 Pleural effusion 04/23/2017  . Stroke [facility]) April, 4,2015  . Type 2 diabetes mellitus with peripheral neuropathy ([place]) 02/23/2016  . Weakness     PAST SURGICAL HISTORY:  Past Surgical History:  Procedure Laterality Date  . CHEST TUBE INSERTION N/A 06/25/2017   Procedure: WIOXBD CATHETER INSERTION;  Surgeon: Ian Lewandowsky, MD;  Location: [facility] ORS;  Service: Thoracic;  Laterality: N/A;  . CHEST TUBE INSERTION Right 08/07/2017   Procedure: REMOVAL OF PLEUR X CATHETER;  Surgeon: Ian Lewandowsky, MD;  Location: [facility] ORS;  Service: General;  Laterality: Right;  . COLONOSCOPY WITH PROPOFOL N/A 08/30/2018   Procedure: COLONOSCOPY WITH PROPOFOL;  Surgeon: Ian Bellows, MD;  Location: [facility] ENDOSCOPY;  Service: Gastroenterology;  Laterality: N/A;  . ESOPHAGOGASTRODUODENOSCOPY N/A 08/28/2018   Procedure: ESOPHAGOGASTRODUODENOSCOPY (EGD);  Surgeon: Ian Manifold, MD;  Location: [facility] ENDOSCOPY;  Service: Endoscopy;  Laterality: N/A;  . NASAL SINUS SURGERY  2000  . TONSILLECTOMY    . VIDEO ASSISTED THORACOSCOPY Right 06/25/2017   Procedure: VIDEO ASSISTED THORACOSCOPY WITH TALC PLEURODESIS, CHEST TUBE INSERTION;  Surgeon: Ian Lewandowsky, MD;  Location: [facility] ORS;  Service: Thoracic;  Laterality: Right;    HEMATOLOGY/ONCOLOGY HISTORY:  Oncology History   # July-AUG 2018- right pleural based mass ~10 cm [ above & below diaphragm]; s/p VATS- Dr.Oaks-Bx- 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   # JUNE 2019- GEM; SEP 9th CT scan- Progression;  # SEP 2019- Severe Anemia/malena [EGD/colo-NEG]  # Hx of CVA Right hand; no residual deficits [left sided carotid block; no surgery done]- on Asa/plavix; CKD- stage III; DM-2; ? PN  # MOLECULAR TESTING- PDL-1- 1% [LOW]; [facility]- No actionable**  ----------------------------------------------------   DIAGNOSIS: [AUG 2019 ] ? [name]   STAGE:  IV  ;GOALS: Palliative  CURRENT/MOST RECENT THERAPY-declined vinorelbine     [name], malignant (Ian Shaffer)    ALLERGIES:  is allergic to sulfa antibiotics; adhesive [tape]; amoxicillin; and other.  MEDICATIONS:  Current Facility-Administered Medications  Medication Dose Route Frequency Provider Last Rate Last Dose  . antiseptic oral rinse (BIOTENE) solution 15 mL  15 mL Topical PRN Ian Silence, MD      . bisacodyl (DULCOLAX) suppository 10 mg  10 mg Rectal Daily PRN Ian Silence, MD      . Derrill Memo ON 09/08/2018] cefTRIAXone (ROCEPHIN) 1 g in sodium chloride 0.9 % 100 mL IVPB  1 g Intravenous Q24H Sridharan, Prasanna, MD      . dexamethasone (DECADRON) injection 4 mg  4 mg Intravenous Q6H Ian Silence, MD   4 mg at 09/07/18 0856  . diphenhydrAMINE (BENADRYL) injection 12.5 mg  12.5 mg Intravenous Q4H PRN Ian Silence, MD      . glycopyrrolate (ROBINUL) injection 0.2 mg  0.2 mg Intravenous Q4H PRN Ian Silence, MD      . haloperidol (HALDOL) 2 MG/ML solution 0.5 mg  0.5 mg Sublingual Q4H PRN Ian Silence, MD       Or  . haloperidol lactate (HALDOL) injection 0.5 mg  0.5 mg Intravenous Q4H PRN Ian Silence, MD      . HYDROmorphone (DILAUDID) injection 1-2 mg  1-2 mg Intravenous Q3H PRN Ian Silence, MD      .  ipratropium-albuterol (DUONEB) 0.5-2.5 (3) MG/3ML nebulizer solution 3 mL  3 mL Nebulization Q6H PRN Ian Shaffer, Ian Boys, NP      . LORazepam (ATIVAN) injection 1-2 mg  1-2 mg Intravenous Q2H PRN Ian Shaffer, Ian Kotyk R, NP      . morphine 170m in NS 1028m(33m37mL) infusion - premix  10 mg/hr Intravenous Continuous SriArta SilenceD 10 mL/hr at 09/07/18 1254 10 mg/hr at 09/07/18 1254  . ondansetron (ZOFRAN) injection 4 mg  4 mg Intravenous Q6H PRN SriArta SilenceD      . polyvinyl alcohol (LIQUIFILM TEARS) 1.4 % ophthalmic solution 1 drop  1 drop Both Eyes QID PRN SriArta SilenceD        VITAL SIGNS: BP 116/72 (BP Location: Left Arm)   Pulse 72   Temp (!) 97.5 F (36.4 C) (Axillary)   Resp (!) 24   Ht _0  (1.778 m)   Wt 181 lb (82.1 kg)   SpO2 100%   BMI 25.97 kg/m  Filed Weights   09/07/18 0302  Weight: 181 lb (82.1 kg)    Estimated body mass index is 25.97 kg/m as calculated from the following:   Height as of this encounter: _1  (1.778 m).   Weight as of this encounter: 181 lb (82.1 kg).  LABS: CBC:    Component Value Date/Time  WBC 15.1 (H) 09/07/2018 0305   HGB 8.7 (L) 09/07/2018 0305   HCT 28.0 (L) 09/07/2018 0305   PLT 463 (H) 09/07/2018 0305   MCV 88.6 09/07/2018 0305   NEUTROABS 12.2 (H) 09/07/2018 0305   LYMPHSABS 1.6 09/07/2018 0305   MONOABS 1.0 09/07/2018 0305   EOSABS 0.1 09/07/2018 0305   BASOSABS 0.1 09/07/2018 0305   Comprehensive Metabolic Panel:    Component Value Date/Time   NA 138 09/07/2018 0305   K 3.9 09/07/2018 0305   CL 100 09/07/2018 0305   CO2 22 09/07/2018 0305   BUN 21 09/07/2018 0305   CREATININE 1.39 (H) 09/07/2018 0305   GLUCOSE 215 (H) 09/07/2018 0305   CALCIUM 9.4 09/07/2018 0305   AST 37 09/07/2018 0305   ALT 22 09/07/2018 0305   ALKPHOS 276 (H) 09/07/2018 0305   BILITOT 0.5 09/07/2018 0305   PROT 7.2 09/07/2018 0305   ALBUMIN 3.1 (L) 09/07/2018 0305    RADIOGRAPHIC STUDIES: Dg Chest 2 View  Result  Date: 08/28/2018 CLINICAL DATA:  Fever today.  History of lung cancer. EXAM: CHEST - 2 VIEW COMPARISON:  Chest x-ray 08/11/2018, chest CT 08/03/2018 FINDINGS: Cardiomediastinal silhouette is normal. Mediastinal contours appear intact. There is no evidence of focal airspace consolidation, pleural effusion or pneumothorax. Interval enlargement of multiple bilateral pulmonary masses. The largest mass in the left lower lung field measures 4.0 cm from prior measurement of 3.6 cm on chest x-ray dated 08/11/2018. Stable volume loss in the right hemithorax. Right peri diaphragmatic mass has also increased in size. Osseous structures are without acute abnormality. Soft tissues are grossly normal. IMPRESSION: Worsening pulmonary metastatic disease. Right pleural thickening versus pleural effusion. Electronically Signed   By: Fidela Salisbury M.D.   On: 08/28/2018 17:30   Dg Chest 2 View  Result Date: 08/11/2018 CLINICAL DATA:  History of mesothelioma, chest pain and fever EXAM: CHEST - 2 VIEW COMPARISON:  CT chest of 08/03/2018 and chest x-ray of 05/20/2017 FINDINGS: Multiple lung nodules again are noted throughout the lungs consistent with diffuse metastatic involvement of the lungs. The largest nodule at the left lung base currently measures 36 mm in diameter compared to prior measurement of approximately 30 mm on recent CT. This may be some discrepancy in matter measurement, but the lesions have certainly not diminished in size. A tiny right pleural effusion cannot be excluded. Mediastinal and hilar contours are unchanged and heart size is stable. No bony abnormality is noted other than degenerative change throughout the thoracic spine. IMPRESSION: Diffuse lung metastases again are noted possibly slightly larger. No pneumonia is seen. A tiny right pleural effusion cannot be excluded Electronically Signed   By: Ivar Drape M.D.   On: 08/11/2018 10:00   Ct Head Wo Contrast  Result Date: 09/07/2018 CLINICAL DATA:   Altered mental status, fever and possible seizures. EXAM: CT HEAD WITHOUT CONTRAST TECHNIQUE: Contiguous axial images were obtained from the base of the skull through the vertex without intravenous contrast. COMPARISON:  None. FINDINGS: Brain: Bilateral nonhemorrhagic intra-axial lesions with vasogenic edema are identified. Representative lesions are identified in the left occipital lobe measuring 1.5 x 1.9 by 1.6 cm, right temporal lobe measuring approximately 1.1 cm in diameter, left frontal lobe measuring 0.7 cm in diameter, near the GU of the right lateral ventricle measuring 0.6 cm in diameter, and the largest in the right high parietal lobe measuring approximately 2.4 x 1.7 x 1.8 cm. Bilateral cerebellar vasogenic edema is also noted left more prominent than right lobe  masses are less discretely visualized on this unenhanced study. No hydrocephalus. No extra-axial fluid. No midline shift or evidence of herniation. Midline fourth ventricle and basal cisterns without effacement. Vascular: No hyperdense vessel sign. Skull: No fracture or aggressive osseous lesions. Sinuses/Orbits: Polypoid mucosal thickening/mucous retention cyst of the right maxillary sinus. Intact orbits and globes. Other: None IMPRESSION: Bilateral supratentorial and infratentorial intra-axial masses with vasogenic edema consistent with metastatic disease. No midline shift or herniation identified. No hydrocephalus. Electronically Signed   By: Ashley Royalty M.D.   On: 09/07/2018 04:02   Dg Chest Port 1 View  Result Date: 09/07/2018 CLINICAL DATA:  Altered mental status. Fever and possible seizure. History of lung cancer. EXAM: PORTABLE CHEST 1 VIEW COMPARISON:  None. FINDINGS: Heart size and mediastinal contours are stable and within normal limits. No pulmonary consolidation nor edema. Redemonstration of bilateral pulmonary masses, the largest approximately 5.3 cm at the right lung base and 4.1 cm in diameter at the left lung base. Trace  right effusion/blunting of the right lateral costophrenic angle is again noted. No aggressive osseous lesions. IMPRESSION: Bilateral pulmonary masses compatible with metastasis. No acute pulmonary consolidation or edema. Electronically Signed   By: Ashley Royalty M.D.   On: 09/07/2018 03:43   US Abdomen Limited Ruq  Result Date: 08/11/2018 CLINICAL DATA:  Upper abdominal pain. History of malignant mesothelioma EXAM: ULTRASOUND ABDOMEN LIMITED RIGHT UPPER QUADRANT COMPARISON:  PET-CT July 09, 2017. Chest CT including portions of the upper abdomen August 03, 2018 FINDINGS: Gallbladder: No gallstones or wall thickening visualized. There is no pericholecystic fluid. No sonographic Murphy sign noted by sonographer. Common bile duct: Diameter: 3 mm. No intrahepatic or extrahepatic biliary duct dilatation. Liver: There are mass lesions in the liver, felt to represent metastatic foci. The largest of these lesions is seen in the anterior segment right lobe of the liver measuring 14.8 x 8.5 x 13.8 cm. A second lesion in the right lobe measures 6.8 x 3.8 x 5.8 cm. A third lesion arising eccentrically from the right lobe of the liver measures 2.2 x 2.0 x 1.9 cm. Within normal limits in parenchymal echogenicity. Portal vein is patent on color Doppler imaging with normal direction of blood flow towards the liver. Right kidney appears somewhat echogenic. IMPRESSION: 1. Widespread metastatic disease throughout the right lobe of the liver. The larger lesions have a somewhat infiltrative appearance. 2. Right kidney appears somewhat echogenic. Question a degree of medical renal disease. 3. No demonstrable gallbladder pathology or biliary duct dilatation. Electronically Signed   By: Lowella Grip III M.D.   On: 08/11/2018 13:35    PERFORMANCE STATUS (ECOG) : 4 - Bedbound  Review of Systems As noted above. Otherwise, a complete review of systems is negative.  Physical Exam General: critically ill  appearing Cardiovascular: regular rate and rhythm Pulmonary: clear ant fields Abdomen: soft, nontender, + bowel sounds Extremities: no edema Skin: no rashes Neurological: unresponsive  IMPRESSION: I met with patient's wife to discuss medical goals.  Also present were patient's brother, sister, and pastor.  Wife says she recognizes that patient is likely approaching end-of-life.  She says her primary goal is to ensure patient is comfortable.  We discussed symptoms that might be anticipated as patient approaches end of life and possible management strategies.    Patient is currently comfortable appearing on a morphine drip.  He is on a nonrebreather.  After discussion with patient's wife, will discontinue nonrebreather mask and place patient on oxygen via nasal cannula.  Patient would  not benefit from frequent checking of vital signs or titrating oxygen to ensure a particular SPO2.  I discussed with wife the option of transitioning patient to hospice care at the hospice home.  She asked about taking patient home with hospice.  This could certainly be arranged.  However, I worry that patient's care might be too much for her to handle even with hospice support.  PLAN: 1.  Continue comfort measures 2.  Recommend discontinuing non-comfort meds 3.  Consider hospice referral if clinically stable tomorrow 4.  DNR   Time Total: 30 minutes  Visit consisted of counseling and education dealing with the complex and emotionally intense issues of symptom management and palliative care in the setting of serious and potentially life-threatening illness.Greater than 50%  of this time was spent counseling and coordinating care related to the above assessment and plan.  Signed by: Altha Harm, [place], NP-C, [place] (Work Cell)

## 2018-09-07 NOTE — Progress Notes (Signed)
Chaplain was rounding and saw comfort cart. Chaplain introduced self and let the family know we are available throughout the night. Family said the have their pastor and chaplin services were not needed. Chaplain let them know we would be praying for them.    09/07/18 1700  Clinical Encounter Type  Visited With Patient and family together  Visit Type Initial;Patient actively dying  Referral From Nurse  Spiritual Encounters  Spiritual Needs Grief support

## 2018-09-07 NOTE — Progress Notes (Signed)
Patient ID: Ian Shaffer, male   DOB: [birth year], 72 y.o.   MRN: [id number]  ACP note  Patient unable to participate in conversation Patient's wife and lots of family members present.  Diagnosis: Metastatic brain lesions for malignant mesothelioma, seizure, UTI, lactic acidosis. history of type 2 diabetes, hypertension, hyperlipidemia and CVA.  CODE STATUS.  Patient is a DNR  Plan.  I confirmed comfort care measures and the family would like to keep the morphine drip going and keep him comfortable.  The patient is barely responsive to sternal rub at this point in time.  Opened his eyes to sternal rub and palpating right upper quadrant.  Family states that he has pain there anyway.  Continue Decadron for edema and seizure medications to prevent seizure.  Continue morphine drip.  Depending on his hospital course will depend on what further decisions need to be made.  Being on this higher dose of morphine drip, things may progress quickly.  Time spent on ACP discussion 25 minutes Dr. Loletha Grayer

## 2018-09-07 NOTE — ED Provider Notes (Signed)
[facility] Emergency Department Provider Note  ____________________________________________   First MD Initiated Contact with Patient 09/07/18 0256     (approximate)  I have reviewed the triage vital signs and the nursing notes.   HISTORY  Chief Complaint Altered Mental Status  Level 5 exemption history limited by the patient's clinical condition  HPI Ian Shaffer is a 72 y.o. male who comes to the emergency department via EMS after possible seizure-like activity and persistent bizarre behavior.  The patient has a complex past medical history including CKD, mesothelioma, diabetes mellitus.    Past Medical History:  Diagnosis Date  . Benign essential hypertension 05/31/2014  . Cancer ([place]) 06/2017   right lung   . Cerebral infarction ([place]) 05/31/2014  . Cerebrovascular disease 05/31/2014  . CKD (chronic kidney disease) stage 3, GFR 30-59 ml/min (HCC) 02/23/2016  . CVA (cerebral infarction)   . Diabetes mellitus without complication ([place])   . Difficult intubation   . DVT (deep venous thrombosis) ([place])   . Esophageal reflux 05/31/2014  . H/O: CVA (cerebrovascular accident)   . Hyperlipidemia   . Hypertension   . ICAO (internal carotid artery occlusion) March 01, 2014   Left  . 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 Pleural effusion 04/23/2017  . Stroke [facility]) April, 4,2015  . Type 2 diabetes mellitus with peripheral neuropathy ([place]) 02/23/2016  . Weakness     Patient Active Problem List   Diagnosis Date Noted  . Brain metastases ([place]) 09/07/2018  . Protein-calorie malnutrition, severe 08/28/2018  . Gastric polyp   . Melena 08/27/2018  . Neoplastic (malignant) related fatigue 08/27/2018  . Malnutrition of moderate degree 05/09/2018  . Colitis, acute 04/15/2018  . Goals of care, counseling/discussion 02/28/2018  . Chemotherapy induced neutropenia ([place]) 09/17/2017  . 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 Lung  mass 06/25/2017  . 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 Pleural effusion 04/23/2017  . CKD (chronic kidney disease) stage 3, GFR 30-59 ml/min (HCC) 02/23/2016  . Type 2 diabetes mellitus with peripheral neuropathy ([place]) 02/23/2016  . Cerebrovascular disease 05/31/2014  . Cerebral infarction ([place]) 05/31/2014  . Esophageal reflux 05/31/2014  . Benign essential hypertension 05/31/2014  . Other and unspecified hyperlipidemia 05/31/2014  . Type II or unspecified type diabetes mellitus without mention of complication, not stated as uncontrolled 05/31/2014    Past Surgical History:  Procedure Laterality Date  . CHEST TUBE INSERTION N/A 06/25/2017   Procedure: QQVZDG CATHETER INSERTION;  Surgeon: Nestor Lewandowsky, MD;  Location: [facility] ORS;  Service: Thoracic;  Laterality: N/A;  . CHEST TUBE INSERTION Right 08/07/2017   Procedure: REMOVAL OF PLEUR X CATHETER;  Surgeon: Nestor Lewandowsky, MD;  Location: [facility] ORS;  Service: General;  Laterality: Right;  . COLONOSCOPY WITH PROPOFOL N/A 08/30/2018   Procedure: COLONOSCOPY WITH PROPOFOL;  Surgeon: Jonathon Bellows, MD;  Location: [facility] ENDOSCOPY;  Service: Gastroenterology;  Laterality: N/A;  . ESOPHAGOGASTRODUODENOSCOPY N/A 08/28/2018   Procedure: ESOPHAGOGASTRODUODENOSCOPY (EGD);  Surgeon: Virgel Manifold, MD;  Location: [facility] ENDOSCOPY;  Service: Endoscopy;  Laterality: N/A;  . NASAL SINUS SURGERY  2000  . TONSILLECTOMY    . VIDEO ASSISTED THORACOSCOPY Right 06/25/2017   Procedure: VIDEO ASSISTED THORACOSCOPY WITH TALC PLEURODESIS, CHEST TUBE INSERTION;  Surgeon: Nestor Lewandowsky, MD;  Location: [facility] ORS;  Service: Thoracic;  Laterality: Right;    Prior to Admission medications   Medication Sig Start Date End Date Taking? Authorizing Provider  acetaminophen (TYLENOL) 325 MG tablet Take 2 tablets (  650 mg total) by mouth every 6 (six) hours as needed for mild pain (or Fever >/= 101). 05/22/18   Loletha Grayer, MD  amLODipine (NORVASC) 5  MG tablet Take 1 tablet by mouth daily. 05/27/18   [provider]  aspirin EC 81 MG tablet Take 81 mg by mouth daily.    [provider]  dronabinol (MARINOL) 2.5 MG capsule Take 1 capsule (2.5 mg total) by mouth 2 (two) times daily before a meal. 08/25/18   Verlon Au, NP  fluticasone (FLONASE) 50 MCG/ACT nasal spray Place 1 spray into both nostrils daily at 2 PM.     [provider]  liraglutide (VICTOZA) 18 MG/3ML SOPN Inject 1.2 mg into the skin daily.    [provider]  lisinopril (PRINIVIL,ZESTRIL) 10 MG tablet Take 10 mg by mouth daily.    [provider]  metoprolol succinate (TOPROL-XL) 25 MG 24 hr tablet Take 25 mg by mouth daily.    [provider]  Multiple Vitamins-Minerals (CENTRUM SILVER PO) Take 1 tablet by mouth daily.    [provider]  ondansetron (ZOFRAN) 8 MG tablet Take 8 mg by mouth every 8 (eight) hours as needed for nausea or refractory nausea / vomiting.  07/31/17   [provider]  oxyCODONE (OXY IR/ROXICODONE) 5 MG immediate release tablet Take 1 tablet (5 mg total) by mouth every 4 (four) hours as needed for severe pain. 08/11/18   Verlon Au, NP  pantoprazole (PROTONIX) 40 MG tablet Take 1 tablet (40 mg total) by mouth 2 (two) times daily. 08/30/18 10/29/18  Henreitta Leber, MD  polyethylene glycol powder (GLYCOLAX/MIRALAX) powder Take 17 g by mouth daily as needed (for constipation.).    [provider]  pravastatin (PRAVACHOL) 40 MG tablet Take 40 mg by mouth daily.    [provider]  prochlorperazine (COMPAZINE) 10 MG tablet Take 10 mg by mouth every 8 (eight) hours as needed for vomiting.  07/31/17   [provider]    Allergies Sulfa antibiotics; Adhesive [tape]; Amoxicillin; and Other  Family History  Problem Relation Age of Onset  . Hypertension Mother   . Hypertension Father     Social History Social History   Tobacco Use  . Smoking status: Former  Smoker    Types: Pipe    Last attempt to quit: 05/07/1974    Years since quitting: 44.3  . Smokeless tobacco: Never Used  . Tobacco comment: pt states he smoked a pipe a long time ago  Substance Use Topics  . Alcohol use: No  . Drug use: No    Review of Systems Level 5 exemption history limited by the patient's clinical condition  ____________________________________________   PHYSICAL EXAM:  VITAL SIGNS: ED Triage Vitals  Enc Vitals Group     BP      Pulse      Resp      Temp      Temp src      SpO2      Weight      Height      Head Circumference      Peak Flow      Pain Score      Pain Loc      Pain Edu?      Excl. in [place]?     Constitutional: Alert and oriented x1 to name only.  Quite confused agitated and fidgeting Eyes: PERRL EOMI. Head: Atraumatic. Nose: No congestion/rhinnorhea. Mouth/Throat: No trismus Neck: No  stridor.  No meningismus Cardiovascular: Tachycardic rate, regular rhythm. Grossly normal heart sounds.  Good peripheral circulation. Respiratory: Normal respiratory effort.  No retractions. Lungs CTAB and moving good air Gastrointestinal: Soft nontender Musculoskeletal: No lower extremity edema   Neurologic: Moves all 4 Skin:  Skin is warm, dry and intact. No rash noted. Psychiatric: Encephalopathic   ____________________________________________   DIFFERENTIAL includes but not limited to  Encephalitis, seizure, intracerebral hemorrhage, metabolic derangement ____________________________________________   LABS (all labs ordered are listed, but only abnormal results are displayed)  Labs Reviewed  COMPREHENSIVE METABOLIC PANEL - Abnormal; Notable for the following components:      Result Value   Glucose, Bld 215 (*)    Creatinine, Ser 1.39 (*)    Albumin 3.1 (*)    Alkaline Phosphatase 276 (*)    GFR calc non Af Amer 48 (*)    GFR calc Af Amer 56 (*)    Anion gap 16 (*)    All other components within normal limits  TROPONIN I -  Abnormal; Notable for the following components:   Troponin I 0.03 (*)    All other components within normal limits  CBC WITH DIFFERENTIAL/PLATELET - Abnormal; Notable for the following components:   WBC 15.1 (*)    RBC 3.16 (*)    Hemoglobin 8.7 (*)    HCT 28.0 (*)    RDW 16.4 (*)    Platelets 463 (*)    Neutro Abs 12.2 (*)    Abs Immature Granulocytes 0.09 (*)    All other components within normal limits  URINALYSIS, ROUTINE W REFLEX MICROSCOPIC - Abnormal; Notable for the following components:   Color, Urine AMBER (*)    APPearance CLOUDY (*)    Glucose, UA 50 (*)    Ketones, ur 5 (*)    Protein, ur 100 (*)    Bacteria, UA MANY (*)    All other components within normal limits  LACTIC ACID, PLASMA - Abnormal; Notable for the following components:   Lactic Acid, Venous 6.3 (*)    All other components within normal limits  CULTURE, BLOOD (ROUTINE X 2)  CULTURE, BLOOD (ROUTINE X 2)  URINE CULTURE  LIPASE, BLOOD  PROCALCITONIN  PROTIME-INR  AMMONIA  PROTEIN AND GLUCOSE, CSF    Lab work reviewed by me with a number of abnormalities.  Elevated lactic acid likely secondary to seizure and not sepsis Elevated troponin likely secondary to demand __________________________________________  EKG  ED ECG REPORT I, Darel Hong, the attending physician, personally viewed and interpreted this ECG.  Date: 09/07/2018 EKG Time:  Rate: 120 Rhythm: Sinus tachycardia QRS Axis: normal Intervals: Prolonged QTC ST/T Wave abnormalities: normal Narrative Interpretation: no evidence of acute ischemia  ____________________________________________  RADIOLOGY  Chest x-ray reviewed by me consistent with bilateral pulmonary malignancy Head CT reviewed by me consistent with multiple brain metastases with vasogenic edema ____________________________________________   PROCEDURES  Procedure(s) performed: no  .Critical Care Performed by: Darel Hong, MD Authorized by: Darel Hong, MD   Critical care provider statement:    Critical care time (minutes):  30   Critical care time was exclusive of:  Separately billable procedures and treating other patients   Critical care was necessary to treat or prevent imminent or life-threatening deterioration of the following conditions:  CNS failure or compromise   Critical care was time spent personally by me on the following activities:  Development of treatment plan with patient or surrogate, discussions with consultants, evaluation of patient's response to treatment, examination  of patient, obtaining history from patient or surrogate, ordering and performing treatments and interventions, ordering and review of laboratory studies, ordering and review of radiographic studies, pulse oximetry, re-evaluation of patient's condition and review of old charts    Critical Care performed: Yes  ____________________________________________   INITIAL IMPRESSION / ASSESSMENT AND PLAN / ED COURSE  Pertinent labs & imaging results that were available during my care of the patient were reviewed by me and considered in my medical decision making (see chart for details).   As part of my medical decision making, I reviewed the following data within the [place] History obtained from family if available, nursing notes, old chart and ekg, as well as notes from prior ED visits.  The patient comes to the emergency department tachycardic, tachypnea, and profoundly confused.  Quick rectal temperature was 100.3 degrees raising concern for sepsis.  He is alert and oriented x1 to name only.  Differential is broad but includes sepsis versus intracerebral pathology.  Labs are pending and will send him off to head CT and if it is negative he will require a lumbar puncture.  10 mg of Decadron and 2 g of ceftriaxone now for possible meningitis encephalitis.  Unfortunately the patient's head CT is back showing multiple brain metastases with  vasogenic edema.  I had a lengthy discussion with the patient's wife and family at bedside and the patient is actually palliative for his lung cancer and was scheduled to have a hospice evaluation tomorrow.  He wants no further treatment and he is DO NOT RESUSCITATE and DO NOT INTUBATE.  Decision was made to give Keppra, continue dexamethasone, and admit to our hospital on a morphine infusion.  I discussed with the hospitalist who has graciously agreed to admit the patient to his service.  I do not believe the patient is septic and I think his elevated temperature and lactic acidosis is secondary to seizure.      ____________________________________________   FINAL CLINICAL IMPRESSION(S) / ED DIAGNOSES  Final diagnoses:  Lactic acidosis  Encephalopathy acute  Brain metastases (HCC)      NEW MEDICATIONS STARTED DURING THIS VISIT:  New Prescriptions   No medications on file     Note:  This document was prepared using Dragon voice recognition software and may include unintentional dictation errors.     Darel Hong, MD 09/07/18 [phone]

## 2018-09-07 NOTE — Progress Notes (Signed)
CODE SEPSIS - PHARMACY COMMUNICATION  **Broad Spectrum Antibiotics should be administered within 1 hour of Sepsis diagnosis**  Time Code Sepsis Called/Page Received: 0258  Antibiotics Ordered: ceftriaxone  Time of 1st antibiotic administration: 0322  Additional action taken by pharmacy:   If necessary, Name of Provider/Nurse Contacted:     Tobie Lords ,PharmD Clinical Pharmacist  09/07/2018  3:54 AM

## 2018-09-07 NOTE — H&P (Addendum)
Ian Shaffer at [place] NAME: Ian Shaffer    MR#:  [id number]  DATE OF BIRTH:  February 06, 1946  DATE OF ADMISSION:  09/07/2018  PRIMARY CARE PHYSICIAN: Derinda Late, MD   REQUESTING/REFERRING PHYSICIAN: Darel Hong, MD  CHIEF COMPLAINT:   Chief Complaint  Patient presents with  . Altered Mental Status    HISTORY OF PRESENT ILLNESS:  Ian Shaffer  is a 72 y.o. male with numerous chronic medical issues, including but not limited to T2NIDDM, HTN, HLD, CVA, DVT, CKD, GERD, and stage IV malignant mesothelioma (most recently on third-line therapy, w/ continued disease progression). Per my understanding of the situation, pt's malignant mesothelioma is due to Hx of asbestos and Agent Orange exposure during the [place] era. His Oncologist is Dr. Rogue Bussing. He was recently referred out for academic/tertiary center evaluation for salvage/trial therapy. A most recent office visit note from 09/02/2018 (Dr. Rogue Bussing) reflects that the decision was made to pursue palliative care (in lieu of fourth-line therapy). I am told this evaluation was slated to happen today (Monday 09/07/2018).  Pt p/w seizure, and is altered at the time of my assessment. He does not answer my questions properly, but is able to follow some basic commands. Pt's wife and family at bedside, provide Hx. They tell me pt was in his usual state of health throughout the weekend, and was doing fairly well. Pt's wife states that she was awoken from sleep in the middle of the night by strange noises. She woke to find her husband shaking. She tried to speak to him, but states he was not responding. EMS was called. CT head performed in ED demonstrates, "Bilateral supratentorial and infratentorial intra-axial masses with vasogenic edema consistent with metastatic disease. No midline shift or herniation identified. No hydrocephalus." Pt's wife and family informed, and are justifiably emotional, but state  that they wish to have Ian Shaffer made comfortable.  Pt complains of abdominal pain and back pain at the time of my assessment. He is not able to provide any other Hx or ROS. He is restless/agitated, but cooperative for exam.  PAST MEDICAL HISTORY:   Past Medical History:  Diagnosis Date  . Benign essential hypertension 05/31/2014  . Cancer ([place]) 06/2017   right lung   . Cerebral infarction ([place]) 05/31/2014  . Cerebrovascular disease 05/31/2014  . CKD (chronic kidney disease) stage 3, GFR 30-59 ml/min ([facility]) 02/23/2016  . CVA (cerebral infarction)   . Diabetes mellitus without complication ([place])   . Difficult intubation   . DVT (deep venous thrombosis) ([place])   . Esophageal reflux 05/31/2014  . H/O: CVA (cerebrovascular accident)   . Hyperlipidemia   . Hypertension   . ICAO (internal carotid artery occlusion) March 01, 2014   Left  . 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 Pleural effusion 04/23/2017  . Stroke [facility]) April, 4,2015  . Type 2 diabetes mellitus with peripheral neuropathy ([place]) 02/23/2016  . Weakness     PAST SURGICAL HISTORY:   Past Surgical History:  Procedure Laterality Date  . CHEST TUBE INSERTION N/A 06/25/2017   Procedure: JSEGBT CATHETER INSERTION;  Surgeon: Nestor Lewandowsky, MD;  Location: [facility] ORS;  Service: Thoracic;  Laterality: N/A;  . CHEST TUBE INSERTION Right 08/07/2017   Procedure: REMOVAL OF PLEUR X CATHETER;  Surgeon: Nestor Lewandowsky, MD;  Location: [facility] ORS;  Service: General;  Laterality: Right;  . COLONOSCOPY WITH PROPOFOL N/A 08/30/2018   Procedure: COLONOSCOPY WITH PROPOFOL;  Surgeon: Jonathon Bellows,  MD;  Location: [facility] ENDOSCOPY;  Service: Gastroenterology;  Laterality: N/A;  . ESOPHAGOGASTRODUODENOSCOPY N/A 08/28/2018   Procedure: ESOPHAGOGASTRODUODENOSCOPY (EGD);  Surgeon: Virgel Manifold, MD;  Location: [facility] ENDOSCOPY;  Service: Endoscopy;  Laterality: N/A;  . NASAL SINUS SURGERY  2000  . TONSILLECTOMY    . VIDEO ASSISTED  THORACOSCOPY Right 06/25/2017   Procedure: VIDEO ASSISTED THORACOSCOPY WITH TALC PLEURODESIS, CHEST TUBE INSERTION;  Surgeon: Nestor Lewandowsky, MD;  Location: [facility] ORS;  Service: Thoracic;  Laterality: Right;    SOCIAL HISTORY:   Social History   Tobacco Use  . Smoking status: Former Smoker    Types: Pipe    Last attempt to quit: 05/07/1974    Years since quitting: 44.3  . Smokeless tobacco: Never Used  . Tobacco comment: pt states he smoked a pipe a long time ago  Substance Use Topics  . Alcohol use: No    FAMILY HISTORY:   Family History  Problem Relation Age of Onset  . Hypertension Mother   . Hypertension Father     DRUG ALLERGIES:   Allergies  Allergen Reactions  . Sulfa Antibiotics Swelling    Facial swelling No tongue or lips swelling, no difficulty breathing.  . Adhesive [Tape] Itching and Rash  . Amoxicillin Nausea Only and Other (See Comments)    Has patient had a PCN reaction causing immediate rash, facial/tongue/throat swelling, SOB or lightheadedness with hypotension: No Has patient had a PCN reaction causing severe rash involving mucus membranes or skin necrosis: No Has patient had a PCN reaction that required hospitalization: No Has patient had a PCN reaction occurring within the last 10 years: Yes If all of the above answers are "NO", then may proceed with Cephalosporin use.   . Other Rash    Surgical tape    REVIEW OF SYSTEMS:   Review of Systems  Unable to perform ROS: Mental status change  Gastrointestinal: Positive for abdominal pain.  Musculoskeletal: Positive for back pain.  Neurological: Positive for seizures.   MEDICATIONS AT HOME:   Prior to Admission medications   Medication Sig Start Date End Date Taking? Authorizing Provider  acetaminophen (TYLENOL) 325 MG tablet Take 2 tablets (650 mg total) by mouth every 6 (six) hours as needed for mild pain (or Fever >/= 101). 05/22/18   Loletha Grayer, MD  amLODipine (NORVASC) 5 MG tablet Take 1  tablet by mouth daily. 05/27/18   [provider]  aspirin EC 81 MG tablet Take 81 mg by mouth daily.    [provider]  dronabinol (MARINOL) 2.5 MG capsule Take 1 capsule (2.5 mg total) by mouth 2 (two) times daily before a meal. 08/25/18   Verlon Au, NP  fluticasone (FLONASE) 50 MCG/ACT nasal spray Place 1 spray into both nostrils daily at 2 PM.     [provider]  liraglutide (VICTOZA) 18 MG/3ML SOPN Inject 1.2 mg into the skin daily.    [provider]  lisinopril (PRINIVIL,ZESTRIL) 10 MG tablet Take 10 mg by mouth daily.    [provider]  metoprolol succinate (TOPROL-XL) 25 MG 24 hr tablet Take 25 mg by mouth daily.    [provider]  Multiple Vitamins-Minerals (CENTRUM SILVER PO) Take 1 tablet by mouth daily.    [provider]  ondansetron (ZOFRAN) 8 MG tablet Take 8 mg by mouth every 8 (eight) hours as needed for nausea or refractory nausea / vomiting.  07/31/17   [provider]  oxyCODONE (OXY IR/ROXICODONE) 5 MG immediate release  tablet Take 1 tablet (5 mg total) by mouth every 4 (four) hours as needed for severe pain. 08/11/18   Verlon Au, NP  pantoprazole (PROTONIX) 40 MG tablet Take 1 tablet (40 mg total) by mouth 2 (two) times daily. 08/30/18 10/29/18  Henreitta Leber, MD  polyethylene glycol powder (GLYCOLAX/MIRALAX) powder Take 17 g by mouth daily as needed (for constipation.).    [provider]  pravastatin (PRAVACHOL) 40 MG tablet Take 40 mg by mouth daily.    [provider]  prochlorperazine (COMPAZINE) 10 MG tablet Take 10 mg by mouth every 8 (eight) hours as needed for vomiting.  07/31/17   [provider]      VITAL SIGNS:  Blood pressure 132/77, pulse (!) 103, temperature 100.3 F (37.9 C), temperature source Rectal, resp. rate (!) 23, height 5\' 10"  (1.778 m), weight 82.1 kg, SpO2 100 %.  PHYSICAL EXAMINATION:  Physical Exam  Constitutional: He appears  well-developed and well-nourished. He is active and cooperative.  Non-toxic appearance. No distress. He is not intubated.  HENT:  Head: Atraumatic.  Mouth/Throat: Oropharynx is clear and moist. No oropharyngeal exudate.  Eyes: Conjunctivae, EOM and lids are normal. No scleral icterus.  Neck: Neck supple. No JVD present. No thyromegaly present.  Cardiovascular: Regular rhythm, S1 normal, S2 normal and normal heart sounds.  No extrasystoles are present. Tachycardia present. Exam reveals no gallop, no S3, no S4, no distant heart sounds and no friction rub.  No murmur heard. Pulmonary/Chest: Effort normal. No accessory muscle usage or stridor. Tachypnea noted. No apnea and no bradypnea. He is not intubated. No respiratory distress. He has decreased breath sounds in the right upper field, the right middle field, the right lower field, the left upper field, the left middle field and the left lower field. He has no wheezes. He has no rhonchi. He has no rales.  Abdominal: Soft. He exhibits no distension. Bowel sounds are decreased. There is generalized tenderness. There is no rigidity, no rebound and no guarding.  Musculoskeletal: Normal range of motion. He exhibits no edema or tenderness.  Lymphadenopathy:    He has no cervical adenopathy.  Neurological: He is alert. He is disoriented.  Skin: Skin is warm, dry and intact. No rash noted. He is not diaphoretic. No erythema.  Psychiatric: His speech is delayed. He is agitated. Cognition and memory are impaired. He is noncommunicative. He is inattentive.   LABORATORY PANEL:   CBC Recent Labs  Lab 09/07/18 0305  WBC 15.1*  HGB 8.7*  HCT 28.0*  PLT 463*   ------------------------------------------------------------------------------------------------------------------  Chemistries  Recent Labs  Lab 09/07/18 0305  NA 138  K 3.9  CL 100  CO2 22  GLUCOSE 215*  BUN 21  CREATININE 1.39*  CALCIUM 9.4  AST 37  ALT 22  ALKPHOS 276*  BILITOT  0.5   ------------------------------------------------------------------------------------------------------------------  Cardiac Enzymes Recent Labs  Lab 09/07/18 0305  TROPONINI 0.03*   ------------------------------------------------------------------------------------------------------------------  RADIOLOGY:  Ct Head Wo Contrast  Result Date: 09/07/2018 CLINICAL DATA:  Altered mental status, fever and possible seizures. EXAM: CT HEAD WITHOUT CONTRAST TECHNIQUE: Contiguous axial images were obtained from the base of the skull through the vertex without intravenous contrast. COMPARISON:  None. FINDINGS: Brain: Bilateral nonhemorrhagic intra-axial lesions with vasogenic edema are identified. Representative lesions are identified in the left occipital lobe measuring 1.5 x 1.9 by 1.6 cm, right temporal lobe measuring approximately 1.1 cm in diameter, left frontal lobe measuring 0.7 cm in diameter, near the GU of  the right lateral ventricle measuring 0.6 cm in diameter, and the largest in the right high parietal lobe measuring approximately 2.4 x 1.7 x 1.8 cm. Bilateral cerebellar vasogenic edema is also noted left more prominent than right lobe masses are less discretely visualized on this unenhanced study. No hydrocephalus. No extra-axial fluid. No midline shift or evidence of herniation. Midline fourth ventricle and basal cisterns without effacement. Vascular: No hyperdense vessel sign. Skull: No fracture or aggressive osseous lesions. Sinuses/Orbits: Polypoid mucosal thickening/mucous retention cyst of the right maxillary sinus. Intact orbits and globes. Other: None IMPRESSION: Bilateral supratentorial and infratentorial intra-axial masses with vasogenic edema consistent with metastatic disease. No midline shift or herniation identified. No hydrocephalus. Electronically Signed   By: Ashley Royalty M.D.   On: 09/07/2018 04:02   Dg Chest Port 1 View  Result Date: 09/07/2018 CLINICAL DATA:  Altered  mental status. Fever and possible seizure. History of lung cancer. EXAM: PORTABLE CHEST 1 VIEW COMPARISON:  None. FINDINGS: Heart size and mediastinal contours are stable and within normal limits. No pulmonary consolidation nor edema. Redemonstration of bilateral pulmonary masses, the largest approximately 5.3 cm at the right lung base and 4.1 cm in diameter at the left lung base. Trace right effusion/blunting of the right lateral costophrenic angle is again noted. No aggressive osseous lesions. IMPRESSION: Bilateral pulmonary masses compatible with metastasis. No acute pulmonary consolidation or edema. Electronically Signed   By: Ashley Royalty M.D.   On: 09/07/2018 03:43   IMPRESSION AND PLAN:   A/P: 89E w/ malignant mesothelioma p/w new-onset Sz, found to have multiple brain metastases on CT head. U/A (+) UTI, SIRS (+), sepsis (+). Comfort care/comfort measures. Other active issues (which are not likely to be addressed) include hyperglycemia (w/ T2NIDDM), Cr elevation/CKD III, elevated APhos, hypoalbuminemia, lactate elevation, leukocytosis, normocytic anemia, thrombocytosis. -Per pt's family, priority will be to keep pt as comfortable as possible. -Started on Decadron and Keppra in ED. These have been continued in hopes of preventing further epileptic activity, such that the pt can retain mental function/status to enough of a degree to which he is able to spend what remaining time he has remaining engaged with his family. Seizure precautions. -For much the same reasons, I have continued pt's antihypertensives. I do not wish for his encephalopathy to be worsened by additional confounders, such as elevated BP. That said, these medications are not essential in the context of the pt's overall global condition, and can be held based on preference and tolerance. -U/A (+) UTI. Ceftriaxone. This is again to prevent delirium and optimize mentation. -My overall goal is to let this gentleman talk to his family as  much and as well as he can before he departs [place]. I do not know how realistic this goal is, but it is what I would want if I were in his position. -Foam wedge, turn q2h. -PRN medications (pain, nausea, indigestion, constipation, agitation, etc.). -Palliative Care consult. Hospice appropriate. -Pleasure foods. -Home meds as tolerated. -DNR/DNI.   All the records are reviewed and case discussed with ED provider. Management plans discussed with the patient, family and they are in agreement.  CODE STATUS: DNR/DNI.  TOTAL TIME TAKING CARE OF THIS PATIENT: 75 minutes.    Arta Silence M.D on 09/07/2018 at 5:11 AM  Between 7am to 6pm - Pager - [phone]  After 6pm go to [email or web address] - Proofreader  [facility]  Office  [phone]  CC: Primary care physician; Derinda Late, MD  Note: This dictation was prepared with Dragon dictation along with smaller phrase technology. Any transcriptional errors that result from this process are unintentional.

## 2018-09-08 LAB — URINE CULTURE: Culture: NO GROWTH

## 2018-09-08 MED ORDER — IPRATROPIUM-ALBUTEROL 0.5-2.5 (3) MG/3ML IN SOLN
3.0000 mL | Freq: Four times a day (QID) | RESPIRATORY_TRACT | Status: DC
  Administered 2018-09-08 – 2018-09-10 (×7): 3 mL via RESPIRATORY_TRACT
  Filled 2018-09-08 (×7): qty 3

## 2018-09-08 MED ORDER — LEVETIRACETAM IN NACL 500 MG/100ML IV SOLN
500.0000 mg | Freq: Two times a day (BID) | INTRAVENOUS | Status: DC
  Filled 2018-09-08 (×2): qty 100

## 2018-09-08 MED ORDER — MORPHINE SULFATE (PF) 2 MG/ML IV SOLN: 2.0000 mg | INTRAVENOUS | Status: DC | PRN

## 2018-09-08 MED ORDER — ORAL CARE MOUTH RINSE
15.0000 mL | Freq: Two times a day (BID) | OROMUCOSAL | Status: DC
  Administered 2018-09-09 – 2018-09-10 (×3): 15 mL via OROMUCOSAL

## 2018-09-08 MED ORDER — SODIUM CHLORIDE 0.9 % IV SOLN
500.0000 mg | Freq: Two times a day (BID) | INTRAVENOUS | Status: DC
  Administered 2018-09-08 – 2018-09-09 (×3): 500 mg via INTRAVENOUS
  Filled 2018-09-08: qty 5
  Filled 2018-09-08: qty 525
  Filled 2018-09-08: qty 5
  Filled 2018-09-08 (×2): qty 525

## 2018-09-08 MED ORDER — SODIUM CHLORIDE 0.9 % IV SOLN
3.0000 g | Freq: Four times a day (QID) | INTRAVENOUS | Status: DC
  Administered 2018-09-08 – 2018-09-09 (×4): 3 g via INTRAVENOUS
  Filled 2018-09-08 (×8): qty 3

## 2018-09-08 NOTE — Plan of Care (Signed)
  Problem: Spiritual Needs Goal: Ability to function at adequate level Outcome: Progressing   Problem: Coping: Goal: Ability to identify and develop effective coping behavior will improve Outcome: Progressing   Problem: Clinical Measurements: Goal: Quality of life will improve Outcome: Progressing   Problem: Respiratory: Goal: Verbalizations of increased ease of respirations will increase Outcome: Progressing   Problem: Role Relationship: Goal: Family's ability to cope with current situation will improve Outcome: Progressing Goal: Ability to verbalize concerns, feelings, and thoughts to partner or family member will improve Outcome: Progressing   Problem: Pain Management: Goal: Satisfaction with pain management regimen will improve Outcome: Progressing   Problem: Health Behavior/Discharge Planning: Goal: Ability to manage health-related needs will improve Outcome: Progressing

## 2018-09-08 NOTE — Progress Notes (Signed)
Patient ID: Ian Shaffer, male   DOB: 11-10-1946, 72 y.o.   MRN: [id number]  ACP time  Patient's wife and son at the bedside Patient unable to participate in conversation  Diagnosis: Metastatic mesothelioma to brain with edema, seizure  Since patient is now responding to me, the family would like to take off the morphine drip so we can assess his mental status better.  Low threshold to put him back on morphine drip and comfort care if patient declines.  Patient is a DNR.  Time spent on ACP discussion 20 minutes Dr. Loletha Grayer

## 2018-09-08 NOTE — Progress Notes (Signed)
[place]  [phone] [phone]   Name: Ian Shaffer Date: 09/08/2018 MRN: [id number]  DOB: Feb 09, 1946  Patient Care Team: Derinda Late, MD as PCP - General (Family Medicine) Margaretha Sheffield, MD (Otolaryngology) Telford Nab, RN as Registered Nurse Nestor Lewandowsky, MD as Referring Physician (Cardiothoracic Surgery) Cammie Sickle, MD as Consulting Physician (Internal Medicine)    REASON FOR CONSULTATION: Palliative Care consult requested for this 72 y.o. 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  PMH also notable for diabetes, hypertension, hyperlipidemia, history of CVA, DVT, and CKD.  Patient was admitted on 09/07/2018 with new onset seizures with a prolonged post ictal state.  CT of the head revealed bilateral supratentorial and infratentorial intra-axial masses with vasogenic edema consistent with metastatic disease.  Family opted to pursue comfort measures and patient was admitted for same.  Palliative care was consulted to help ensure patient's comfort and address goals.  CODE STATUS: DNR  PAST MEDICAL HISTORY: Past Medical History:  Diagnosis Date  . Benign essential hypertension 05/31/2014  . Cancer ([place]) 06/2017   right lung   . Cerebral infarction ([place]) 05/31/2014  . Cerebrovascular disease 05/31/2014  . CKD (chronic kidney disease) stage 3, GFR 30-59 ml/min (HCC) 02/23/2016  . CVA (cerebral infarction)   . Diabetes mellitus without complication ([place])   . Difficult intubation   . DVT (deep venous thrombosis) ([place])   . Esophageal reflux 05/31/2014  . H/O: CVA (cerebrovascular accident)   . Hyperlipidemia   . Hypertension   . ICAO (internal carotid artery occlusion) March 01, 2014   Left  . 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 Pleural effusion 04/23/2017  . Stroke [facility]) April, 4,2015    . Type 2 diabetes mellitus with peripheral neuropathy ([place]) 02/23/2016  . Weakness     PAST SURGICAL HISTORY:  Past Surgical History:  Procedure Laterality Date  . CHEST TUBE INSERTION N/A 06/25/2017   Procedure: VQMGQQ CATHETER INSERTION;  Surgeon: Nestor Lewandowsky, MD;  Location: [facility] ORS;  Service: Thoracic;  Laterality: N/A;  . CHEST TUBE INSERTION Right 08/07/2017   Procedure: REMOVAL OF PLEUR X CATHETER;  Surgeon: Nestor Lewandowsky, MD;  Location: [facility] ORS;  Service: General;  Laterality: Right;  . COLONOSCOPY WITH PROPOFOL N/A 08/30/2018   Procedure: COLONOSCOPY WITH PROPOFOL;  Surgeon: Jonathon Bellows, MD;  Location: [facility] ENDOSCOPY;  Service: Gastroenterology;  Laterality: N/A;  . ESOPHAGOGASTRODUODENOSCOPY N/A 08/28/2018   Procedure: ESOPHAGOGASTRODUODENOSCOPY (EGD);  Surgeon: Virgel Manifold, MD;  Location: [facility] ENDOSCOPY;  Service: Endoscopy;  Laterality: N/A;  . NASAL SINUS SURGERY  2000  . TONSILLECTOMY    . VIDEO ASSISTED THORACOSCOPY Right 06/25/2017   Procedure: VIDEO ASSISTED THORACOSCOPY WITH TALC PLEURODESIS, CHEST TUBE INSERTION;  Surgeon: Nestor Lewandowsky, MD;  Location: [facility] ORS;  Service: Thoracic;  Laterality: Right;    HEMATOLOGY/ONCOLOGY HISTORY:  Oncology History   # July-AUG 2018- right pleural based mass ~10 cm [ above & below diaphragm]; s/p VATS- Dr.Oaks-Bx- 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   # JUNE 2019- GEM; SEP 9th CT scan- Progression;  # SEP 2019- Severe Anemia/malena [EGD/colo-NEG]  # Hx of CVA Right hand; no residual deficits [left sided carotid block; no surgery done]- on Asa/plavix; CKD- stage III; DM-2; ? PN  # MOLECULAR TESTING- PDL-1- 1% [LOW]; [facility]- No actionable**  ----------------------------------------------------   DIAGNOSIS: [AUG 2019 ] ? [name]   STAGE:  IV  ;GOALS: Palliative  CURRENT/MOST RECENT THERAPY-declined vinorelbine     [name], malignant ([place])    ALLERGIES:  is allergic to sulfa antibiotics; adhesive [tape]; amoxicillin; and other.  MEDICATIONS:  Current Facility-Administered Medications  Medication Dose Route Frequency Provider Last Rate Last Dose  . Ampicillin-Sulbactam (UNASYN) 3 g in sodium chloride 0.9 % 100 mL IVPB  3 g Intravenous Q6H Hallaji, Sheema M, RPH      . antiseptic oral rinse (BIOTENE) solution 15 mL  15 mL Topical PRN Arta Silence, MD      . bisacodyl (DULCOLAX) suppository 10 mg  10 mg Rectal Daily PRN Arta Silence, MD      . dexamethasone (DECADRON) injection 4 mg  4 mg Intravenous Q6H Arta Silence, MD   4 mg at 09/08/18 0913  . diphenhydrAMINE (BENADRYL) injection 12.5 mg  12.5 mg Intravenous Q4H PRN Arta Silence, MD      . glycopyrrolate (ROBINUL) injection 0.2 mg  0.2 mg Intravenous Q4H PRN Jodell Cipro, Prasanna, MD      . ipratropium-albuterol (DUONEB) 0.5-2.5 (3) MG/3ML nebulizer solution 3 mL  3 mL Nebulization Q6H Wieting, Richard, MD      . levETIRAcetam (KEPPRA) IVPB 500 mg/100 mL premix  500 mg Intravenous Q12H Wieting, Richard, MD      . LORazepam (ATIVAN) injection 1-2 mg  1-2 mg Intravenous Q2H PRN Borders, Kirt Boys, NP      . morphine 2 MG/ML injection 2 mg  2 mg Intravenous Q4H PRN Wieting, Richard, MD      . ondansetron (ZOFRAN) injection 4 mg  4 mg Intravenous Q6H PRN Arta Silence, MD      . polyvinyl alcohol (LIQUIFILM TEARS) 1.4 %  ophthalmic solution 1 drop  1 drop Both Eyes QID PRN Arta Silence, MD        VITAL SIGNS: BP 98/65 (BP Location: Right Arm)   Pulse 74   Temp (!) 96.8 F (36 C) (Axillary)   Resp 18   Ht _0  (1.778 m)   Wt 181 lb (82.1 kg)   SpO2 90%   BMI 25.97 kg/m  Filed Weights   09/07/18 0302  Weight: 181 lb (82.1 kg)    Estimated body mass index is 25.97 kg/m as calculated from the following:   Height as of this encounter: _1  (1.778 m).   Weight as of this encounter: 181 lb (82.1 kg).  LABS: CBC:    Component Value Date/Time   WBC 15.1 (H) 09/07/2018 0305   HGB 8.7 (L) 09/07/2018 0305   HCT 28.0 (L) 09/07/2018 0305   PLT 463 (H) 09/07/2018 0305   MCV 88.6 09/07/2018 0305   NEUTROABS 12.2 (H) 09/07/2018 0305   LYMPHSABS 1.6 09/07/2018 0305   MONOABS 1.0 09/07/2018 0305   EOSABS 0.1 09/07/2018 0305   BASOSABS 0.1 09/07/2018 0305   Comprehensive Metabolic Panel:    Component Value Date/Time   NA 138 09/07/2018 0305   K 3.9 09/07/2018 0305   CL 100 09/07/2018 0305   CO2 22  09/07/2018 0305   BUN 21 09/07/2018 0305   CREATININE 1.39 (H) 09/07/2018 0305   GLUCOSE 215 (H) 09/07/2018 0305   CALCIUM 9.4 09/07/2018 0305   AST 37 09/07/2018 0305   ALT 22 09/07/2018 0305   ALKPHOS 276 (H) 09/07/2018 0305   BILITOT 0.5 09/07/2018 0305   PROT 7.2 09/07/2018 0305   ALBUMIN 3.1 (L) 09/07/2018 0305    RADIOGRAPHIC STUDIES: Dg Chest 2 View  Result Date: 08/28/2018 CLINICAL DATA:  Fever today.  History of lung cancer. EXAM: CHEST - 2 VIEW COMPARISON:  Chest x-ray 08/11/2018, chest CT 08/03/2018 FINDINGS: Cardiomediastinal silhouette is normal. Mediastinal contours appear intact. There is no evidence of focal airspace consolidation, pleural effusion or pneumothorax. Interval enlargement of multiple bilateral pulmonary masses. The largest mass in the left lower lung field measures 4.0 cm from prior measurement of 3.6 cm on chest x-ray dated 08/11/2018. Stable volume loss in  the right hemithorax. Right peri diaphragmatic mass has also increased in size. Osseous structures are without acute abnormality. Soft tissues are grossly normal. IMPRESSION: Worsening pulmonary metastatic disease. Right pleural thickening versus pleural effusion. Electronically Signed   By: Fidela Salisbury M.D.   On: 08/28/2018 17:30   Dg Chest 2 View  Result Date: 08/11/2018 CLINICAL DATA:  History of mesothelioma, chest pain and fever EXAM: CHEST - 2 VIEW COMPARISON:  CT chest of 08/03/2018 and chest x-ray of 05/20/2017 FINDINGS: Multiple lung nodules again are noted throughout the lungs consistent with diffuse metastatic involvement of the lungs. The largest nodule at the left lung base currently measures 36 mm in diameter compared to prior measurement of approximately 30 mm on recent CT. This may be some discrepancy in matter measurement, but the lesions have certainly not diminished in size. A tiny right pleural effusion cannot be excluded. Mediastinal and hilar contours are unchanged and heart size is stable. No bony abnormality is noted other than degenerative change throughout the thoracic spine. IMPRESSION: Diffuse lung metastases again are noted possibly slightly larger. No pneumonia is seen. A tiny right pleural effusion cannot be excluded Electronically Signed   By: Ivar Drape M.D.   On: 08/11/2018 10:00   Ct Head Wo Contrast  Result Date: 09/07/2018 CLINICAL DATA:  Altered mental status, fever and possible seizures. EXAM: CT HEAD WITHOUT CONTRAST TECHNIQUE: Contiguous axial images were obtained from the base of the skull through the vertex without intravenous contrast. COMPARISON:  None. FINDINGS: Brain: Bilateral nonhemorrhagic intra-axial lesions with vasogenic edema are identified. Representative lesions are identified in the left occipital lobe measuring 1.5 x 1.9 by 1.6 cm, right temporal lobe measuring approximately 1.1 cm in diameter, left frontal lobe measuring 0.7 cm in diameter,  near the GU of the right lateral ventricle measuring 0.6 cm in diameter, and the largest in the right high parietal lobe measuring approximately 2.4 x 1.7 x 1.8 cm. Bilateral cerebellar vasogenic edema is also noted left more prominent than right lobe masses are less discretely visualized on this unenhanced study. No hydrocephalus. No extra-axial fluid. No midline shift or evidence of herniation. Midline fourth ventricle and basal cisterns without effacement. Vascular: No hyperdense vessel sign. Skull: No fracture or aggressive osseous lesions. Sinuses/Orbits: Polypoid mucosal thickening/mucous retention cyst of the right maxillary sinus. Intact orbits and globes. Other: None IMPRESSION: Bilateral supratentorial and infratentorial intra-axial masses with vasogenic edema consistent with metastatic disease. No midline shift or herniation identified. No hydrocephalus. Electronically Signed   By: Ashley Royalty M.D.   On: 09/07/2018 04:02  Dg Chest Port 1 View  Result Date: 09/07/2018 CLINICAL DATA:  Altered mental status. Fever and possible seizure. History of lung cancer. EXAM: PORTABLE CHEST 1 VIEW COMPARISON:  None. FINDINGS: Heart size and mediastinal contours are stable and within normal limits. No pulmonary consolidation nor edema. Redemonstration of bilateral pulmonary masses, the largest approximately 5.3 cm at the right lung base and 4.1 cm in diameter at the left lung base. Trace right effusion/blunting of the right lateral costophrenic angle is again noted. No aggressive osseous lesions. IMPRESSION: Bilateral pulmonary masses compatible with metastasis. No acute pulmonary consolidation or edema. Electronically Signed   By: Ashley Royalty M.D.   On: 09/07/2018 03:43   US Abdomen Limited Ruq  Result Date: 08/11/2018 CLINICAL DATA:  Upper abdominal pain. History of malignant mesothelioma EXAM: ULTRASOUND ABDOMEN LIMITED RIGHT UPPER QUADRANT COMPARISON:  PET-CT July 09, 2017. Chest CT including portions of  the upper abdomen August 03, 2018 FINDINGS: Gallbladder: No gallstones or wall thickening visualized. There is no pericholecystic fluid. No sonographic Murphy sign noted by sonographer. Common bile duct: Diameter: 3 mm. No intrahepatic or extrahepatic biliary duct dilatation. Liver: There are mass lesions in the liver, felt to represent metastatic foci. The largest of these lesions is seen in the anterior segment right lobe of the liver measuring 14.8 x 8.5 x 13.8 cm. A second lesion in the right lobe measures 6.8 x 3.8 x 5.8 cm. A third lesion arising eccentrically from the right lobe of the liver measures 2.2 x 2.0 x 1.9 cm. Within normal limits in parenchymal echogenicity. Portal vein is patent on color Doppler imaging with normal direction of blood flow towards the liver. Right kidney appears somewhat echogenic. IMPRESSION: 1. Widespread metastatic disease throughout the right lobe of the liver. The larger lesions have a somewhat infiltrative appearance. 2. Right kidney appears somewhat echogenic. Question a degree of medical renal disease. 3. No demonstrable gallbladder pathology or biliary duct dilatation. Electronically Signed   By: Lowella Grip III M.D.   On: 08/11/2018 13:35    PERFORMANCE STATUS (ECOG) : 3 - Symptomatic, >50% confined to bed  Review of Systems Unable to provide  Physical Exam General: ill appearing Cardiovascular: regular rate and rhythm Pulmonary: coarse BS ant fields Abdomen: soft, nontender, + bowel sounds GU: no suprapubic tenderness Extremities: no edema, no joint deformities Skin: no rashes Neurological: alert, answers simple questions, follows simple commands  IMPRESSION: Patient is more alert today.  He is confused but able to answer simple questions and follow simple commands.  Morphine drip held.  Speech therapy has been consulted to evaluate swallowing.  Patient has some pulmonary congestion concerning for possible aspiration in the setting of  seizure/sedation.  Antibiotics are being restarted.  Continue Keppra and Decadron.  Evaluate for resuming outpatient meds following speech eval.  I spoke with patient's wife and brother.  Both are optimistic regarding patient's change in neurological status.  Case discussed with attending.  Will follow.  PLAN: Continue supportive care.   Time Total: 15 minutes  Visit consisted of counseling and education dealing with the complex and emotionally intense issues of symptom management and palliative care in the setting of serious and potentially life-threatening illness.Greater than 50%  of this time was spent counseling and coordinating care related to the above assessment and plan.  Signed by: Altha Harm, [place], NP-C, [place] (Work Cell)

## 2018-09-08 NOTE — Progress Notes (Signed)
PHARMACY - PHYSICIAN COMMUNICATION CRITICAL VALUE ALERT - BLOOD CULTURE IDENTIFICATION (BCID)  Ian Shaffer is an 72 y.o. male who presented to [facility] on 09/07/2018 with a chief complaint of   Assessment:  BCID 1/4 Staph spp mecA(+) (include suspected source if known)  Name of physician (or Provider) Contacted: Jodell Cipro  Current antibiotics: ceftriaxone  Changes to prescribed antibiotics recommended:  No new abx  Results for orders placed or performed during the hospital encounter of 09/07/18  Blood Culture ID Panel (Reflexed) (Collected: 09/07/2018  3:06 AM)  Result Value Ref Range   Enterococcus species NOT DETECTED NOT DETECTED   Vancomycin resistance NOT DETECTED NOT DETECTED   Listeria monocytogenes NOT DETECTED NOT DETECTED   Staphylococcus species DETECTED (A) NOT DETECTED   Staphylococcus aureus (BCID) NOT DETECTED NOT DETECTED   Methicillin resistance DETECTED (A) NOT DETECTED   Streptococcus species NOT DETECTED NOT DETECTED   Streptococcus agalactiae NOT DETECTED NOT DETECTED   Streptococcus pneumoniae NOT DETECTED NOT DETECTED   Streptococcus pyogenes NOT DETECTED NOT DETECTED   Acinetobacter baumannii NOT DETECTED NOT DETECTED   Enterobacteriaceae species NOT DETECTED NOT DETECTED   Enterobacter cloacae complex NOT DETECTED NOT DETECTED   Escherichia coli NOT DETECTED NOT DETECTED   Klebsiella oxytoca NOT DETECTED NOT DETECTED   Klebsiella pneumoniae NOT DETECTED NOT DETECTED   Proteus species NOT DETECTED NOT DETECTED   Serratia marcescens NOT DETECTED NOT DETECTED   Carbapenem resistance NOT DETECTED NOT DETECTED   Haemophilus influenzae NOT DETECTED NOT DETECTED   Neisseria meningitidis NOT DETECTED NOT DETECTED   Pseudomonas aeruginosa NOT DETECTED NOT DETECTED   Candida albicans NOT DETECTED NOT DETECTED   Candida glabrata NOT DETECTED NOT DETECTED   Candida krusei NOT DETECTED NOT DETECTED   Candida parapsilosis NOT DETECTED NOT DETECTED   Candida tropicalis NOT DETECTED NOT DETECTED    [name] S 09/08/2018  3:28 AM

## 2018-09-08 NOTE — Progress Notes (Signed)
Patient ID: Ian Shaffer, male   DOB: 04-Jun-1946, 72 y.o.   MRN: [id number]  [employer] PROGRESS NOTE  Eutimio [place] EPP:[id number] DOB: 05/25/1946 DOA: 09/07/2018 PCP: Derinda Late, MD  HPI/Subjective: Patient on morphine drip and is responding to me.  Following some simple commands.  He is talking with me.  Able to answer few questions.  Objective: Vitals:   09/07/18 0626 09/08/18 0348  BP: 116/72 98/65  Pulse: 72 74  Resp:  18  Temp: (!) 97.5 F (36.4 C) (!) 96.8 F (36 C)  SpO2: 100% 90%    Filed Weights   09/07/18 0302  Weight: 82.1 kg    ROS: Review of Systems  Unable to perform ROS: Acuity of condition  Respiratory: Negative for shortness of breath.   Cardiovascular: Negative for chest pain.  Gastrointestinal: Negative for abdominal pain.  Musculoskeletal: Negative for joint pain.   Exam: Physical Exam  HENT:  Nose: No mucosal edema.  Mouth/Throat: No oropharyngeal exudate or posterior oropharyngeal edema.  Eyes: Pupils are equal, round, and reactive to light. Conjunctivae and lids are normal.  Unable to move his eyes to the left  Neck: No JVD present. Carotid bruit is not present. No edema present. No thyroid mass and no thyromegaly present.  Cardiovascular: S1 normal and S2 normal. Exam reveals no gallop.  No murmur heard. Pulses:      Dorsalis pedis pulses are 2+ on the right side, and 2+ on the left side.  Respiratory: No respiratory distress. He has decreased breath sounds in the right lower field and the left lower field. He has no wheezes. He has no rhonchi. He has no rales.  GI: Soft. Bowel sounds are normal. There is tenderness in the right upper quadrant.  Musculoskeletal:       Right ankle: He exhibits no swelling.       Left ankle: He exhibits no swelling.  Lymphadenopathy:    He has no cervical adenopathy.  Neurological: He is alert.  Unable to move his eyes to the left.  Able to move his lower extremities and arms  Skin: No  rash noted. He is diaphoretic. Nails show no clubbing.  Psychiatric:  Answers a few questions and follow some simple commands.      Data Reviewed: Basic Metabolic Panel: Recent Labs  Lab 09/02/18 1324 09/07/18 0305  NA 137 138  K 4.5 3.9  CL 101 100  CO2 26 22  GLUCOSE 216* 215*  BUN 21 21  CREATININE 1.40* 1.39*  CALCIUM 9.2 9.4   Liver Function Tests: Recent Labs  Lab 09/02/18 1324 09/07/18 0305  AST 39 37  ALT 29 22  ALKPHOS 323* 276*  BILITOT 0.5 0.5  PROT 6.7 7.2  ALBUMIN 3.1* 3.1*   Recent Labs  Lab 09/07/18 0305  LIPASE 22   Recent Labs  Lab 09/07/18 0305  AMMONIA 17   CBC: Recent Labs  Lab 09/02/18 1324 09/07/18 0305  WBC 12.2* 15.1*  NEUTROABS 10.5* 12.2*  HGB 8.1* 8.7*  HCT 25.9* 28.0*  MCV 89.0 88.6  PLT 314 463*   Cardiac Enzymes: Recent Labs  Lab 09/07/18 0305  TROPONINI 0.03*     Recent Results (from the past 240 hour(s))  Urine culture     Status: None   Collection Time: 09/07/18  3:05 AM  Result Value Ref Range Status   Specimen Description   Final    URINE, RANDOM Performed at [facility], [address], [place], [place] [address]  Special Requests   Final    NONE Performed at [facility], [address], [place], [place] [address]    Culture   Final    NO GROWTH Performed at [facility], [place] [address], [place], [place] [address]    Report Status 09/08/2018 FINAL  Final  Blood Culture (routine x 2)     Status: None (Preliminary result)   Collection Time: 09/07/18  3:06 AM  Result Value Ref Range Status   Specimen Description BLOOD RIGHT FOREARM  Final   Special Requests   Final    BOTTLES DRAWN AEROBIC AND ANAEROBIC Blood Culture results may not be optimal due to an excessive volume of blood received in culture bottles   Culture   Final    NO GROWTH 1 DAY Performed at [facility], [address] [place], [place] [address]    Report Status PENDING  Incomplete  Blood  Culture (routine x 2)     Status: None (Preliminary result)   Collection Time: 09/07/18  3:06 AM  Result Value Ref Range Status   Specimen Description BLOOD RIGHT FOREARM INNER  Final   Special Requests   Final    BOTTLES DRAWN AEROBIC AND ANAEROBIC Blood Culture adequate volume   Culture  Setup Time   Final    Organism ID to follow GRAM POSITIVE COCCI AEROBIC BOTTLE ONLY CRITICAL RESULT CALLED TO, READ BACK BY AND VERIFIED WITH: MATT [facility] 09/07/18 2207 REC Performed at [facility], [address] [place], [place] [address]    Culture GRAM POSITIVE COCCI  Final   Report Status PENDING  Incomplete  Blood Culture ID Panel (Reflexed)     Status: Abnormal   Collection Time: 09/07/18  3:06 AM  Result Value Ref Range Status   Enterococcus species NOT DETECTED NOT DETECTED Final   Vancomycin resistance NOT DETECTED NOT DETECTED Final   Listeria monocytogenes NOT DETECTED NOT DETECTED Final   Staphylococcus species DETECTED (A) NOT DETECTED Final    Comment: CRITICAL RESULT CALLED TO, READ BACK BY AND VERIFIED WITH: MATT [facility] 09/07/18 2207 REC Methicillin (oxacillin) resistant coagulase negative staphylococcus. Possible blood culture contaminant (unless isolated from more than one blood culture draw or clinical case suggests pathogenicity). No antibiotic treatment is indicated for blood  culture contaminants.    Staphylococcus aureus (BCID) NOT DETECTED NOT DETECTED Final   Methicillin resistance DETECTED (A) NOT DETECTED Final    Comment: CRITICAL RESULT CALLED TO, READ BACK BY AND VERIFIED WITH: MATT MCBANE 09/07/18 2207 REC    Streptococcus species NOT DETECTED NOT DETECTED Final   Streptococcus agalactiae NOT DETECTED NOT DETECTED Final   Streptococcus pneumoniae NOT DETECTED NOT DETECTED Final   Streptococcus pyogenes NOT DETECTED NOT DETECTED Final   Acinetobacter baumannii NOT DETECTED NOT DETECTED Final   Enterobacteriaceae species NOT DETECTED NOT DETECTED Final    Enterobacter cloacae complex NOT DETECTED NOT DETECTED Final   Escherichia coli NOT DETECTED NOT DETECTED Final   Klebsiella oxytoca NOT DETECTED NOT DETECTED Final   Klebsiella pneumoniae NOT DETECTED NOT DETECTED Final   Proteus species NOT DETECTED NOT DETECTED Final   Serratia marcescens NOT DETECTED NOT DETECTED Final   Carbapenem resistance NOT DETECTED NOT DETECTED Final   Haemophilus influenzae NOT DETECTED NOT DETECTED Final   Neisseria meningitidis NOT DETECTED NOT DETECTED Final   Pseudomonas aeruginosa NOT DETECTED NOT DETECTED Final   Candida albicans NOT DETECTED NOT DETECTED Final   Candida glabrata NOT DETECTED NOT DETECTED Final  Candida krusei NOT DETECTED NOT DETECTED Final   Candida parapsilosis NOT DETECTED NOT DETECTED Final   Candida tropicalis NOT DETECTED NOT DETECTED Final    Comment: Performed at [facility], [address] [place], [place] [address]     Studies: Ct Head Wo Contrast  Result Date: 09/07/2018 CLINICAL DATA:  Altered mental status, fever and possible seizures. EXAM: CT HEAD WITHOUT CONTRAST TECHNIQUE: Contiguous axial images were obtained from the base of the skull through the vertex without intravenous contrast. COMPARISON:  None. FINDINGS: Brain: Bilateral nonhemorrhagic intra-axial lesions with vasogenic edema are identified. Representative lesions are identified in the left occipital lobe measuring 1.5 x 1.9 by 1.6 cm, right temporal lobe measuring approximately 1.1 cm in diameter, left frontal lobe measuring 0.7 cm in diameter, near the GU of the right lateral ventricle measuring 0.6 cm in diameter, and the largest in the right high parietal lobe measuring approximately 2.4 x 1.7 x 1.8 cm. Bilateral cerebellar vasogenic edema is also noted left more prominent than right lobe masses are less discretely visualized on this unenhanced study. No hydrocephalus. No extra-axial fluid. No midline shift or evidence of herniation. Midline  fourth ventricle and basal cisterns without effacement. Vascular: No hyperdense vessel sign. Skull: No fracture or aggressive osseous lesions. Sinuses/Orbits: Polypoid mucosal thickening/mucous retention cyst of the right maxillary sinus. Intact orbits and globes. Other: None IMPRESSION: Bilateral supratentorial and infratentorial intra-axial masses with vasogenic edema consistent with metastatic disease. No midline shift or herniation identified. No hydrocephalus. Electronically Signed   By: Ashley Royalty M.D.   On: 09/07/2018 04:02   Dg Chest Port 1 View  Result Date: 09/07/2018 CLINICAL DATA:  Altered mental status. Fever and possible seizure. History of lung cancer. EXAM: PORTABLE CHEST 1 VIEW COMPARISON:  None. FINDINGS: Heart size and mediastinal contours are stable and within normal limits. No pulmonary consolidation nor edema. Redemonstration of bilateral pulmonary masses, the largest approximately 5.3 cm at the right lung base and 4.1 cm in diameter at the left lung base. Trace right effusion/blunting of the right lateral costophrenic angle is again noted. No aggressive osseous lesions. IMPRESSION: Bilateral pulmonary masses compatible with metastasis. No acute pulmonary consolidation or edema. Electronically Signed   By: Ashley Royalty M.D.   On: 09/07/2018 03:43    Scheduled Meds: . dexamethasone  4 mg Intravenous Q6H   Continuous Infusions:  Assessment/Plan:  1. 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  On Decadron.  Overall prognosis is poor.  But since patient is now responding and answering a few questions but the family would like to take him off the morphine drip to further assess.  If patient worsens will end up putting back on morphine drip. 2. Seizures.  Being treated with antiseizure medications. 3. UTI on Rocephin 4. Lactic acidosis secondary to seizure 5. History of diabetes 6. History of hypertension 7. History of hyperlipidemia 8. History of  CVA 9. Swallow evaluation  Code Status:     Code Status Orders  (From admission, onward)         Start     Ordered   09/07/18 0632  Do not attempt resuscitation (DNR)  Continuous    Question Answer Comment  In the event of cardiac or respiratory ARREST Do not call a "code blue"   In the event of cardiac or respiratory ARREST Do not perform Intubation, CPR, defibrillation or ACLS   In the event of cardiac or respiratory ARREST Use medication by any route, position, wound care,  and other measures to relive pain and suffering. May use oxygen, suction and manual treatment of airway obstruction as needed for comfort.      09/07/18 0631        Code Status History    Date Active Date Inactive Code Status Order ID Comments User Context   08/27/2018 8453 08/30/2018 1857 Full Code [id number]  Gorden Harms, MD Inpatient   05/21/2018 0016 05/22/2018 1343 Full Code [id number]  Lance Coon, MD ED   05/07/2018 2248 05/12/2018 2006 Full Code [id number]  Harrie Foreman, MD Inpatient   09/09/2017 0242 09/09/2017 1722 Full Code [id number]  Demetrios Loll, MD Inpatient   06/25/2017 1347 06/27/2017 1659 Full Code [id number]  Nestor Lewandowsky, MD Inpatient   04/23/2017 0134 04/23/2017 1657 Full Code [id number]  Hugelmeyer, Ubaldo Glassing, DO Inpatient    Advance Directive Documentation     Most Recent Value  Type of Advance Directive  Healthcare Power of Attorney, Living will  Pre-existing out of facility DNR order (yellow form or pink MOST form)  -  "MOST" Form in Place?  -     Family Communication: Family at the bedside Disposition Plan: To be determined  Consultants:  Palliative care  Time spent: 30 minutes including ACP time  [employer]

## 2018-09-08 NOTE — Evaluation (Signed)
Clinical/Bedside Swallow Evaluation Patient Details  Name: Ian Shaffer MRN: [id number] Date of Birth: 22-Sep-1946  Today's Date: 09/08/2018 Time: SLP Start Time (ACUTE ONLY): 1430 SLP Stop Time (ACUTE ONLY): 1530 SLP Time Calculation (min) (ACUTE ONLY): 60 min  Past Medical History:  Past Medical History:  Diagnosis Date  . Benign essential hypertension 05/31/2014  . Cancer ([place]) 06/2017   right lung   . Cerebral infarction ([place]) 05/31/2014  . Cerebrovascular disease 05/31/2014  . CKD (chronic kidney disease) stage 3, GFR 30-59 ml/min (HCC) 02/23/2016  . CVA (cerebral infarction)   . Diabetes mellitus without complication ([place])   . Difficult intubation   . DVT (deep venous thrombosis) ([place])   . Esophageal reflux 05/31/2014  . H/O: CVA (cerebrovascular accident)   . Hyperlipidemia   . Hypertension   . ICAO (internal carotid artery occlusion) March 01, 2014   Left  . 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 Pleural effusion 04/23/2017  . Stroke [facility]) April, 4,2015  . Type 2 diabetes mellitus with peripheral neuropathy ([place]) 02/23/2016  . Weakness    Past Surgical History:  Past Surgical History:  Procedure Laterality Date  . CHEST TUBE INSERTION N/A 06/25/2017   Procedure: QBHALP CATHETER INSERTION;  Surgeon: Nestor Lewandowsky, MD;  Location: [facility] ORS;  Service: Thoracic;  Laterality: N/A;  . CHEST TUBE INSERTION Right 08/07/2017   Procedure: REMOVAL OF PLEUR X CATHETER;  Surgeon: Nestor Lewandowsky, MD;  Location: [facility] ORS;  Service: General;  Laterality: Right;  . COLONOSCOPY WITH PROPOFOL N/A 08/30/2018   Procedure: COLONOSCOPY WITH PROPOFOL;  Surgeon: Jonathon Bellows, MD;  Location: [facility] ENDOSCOPY;  Service: Gastroenterology;  Laterality: N/A;  . ESOPHAGOGASTRODUODENOSCOPY N/A 08/28/2018   Procedure: ESOPHAGOGASTRODUODENOSCOPY (EGD);  Surgeon: Virgel Manifold, MD;  [facility] ENDOSCOPY;  Service: Endoscopy;  Laterality: N/A;  . NASAL SINUS SURGERY  2000  .  TONSILLECTOMY    . VIDEO ASSISTED THORACOSCOPY Right 06/25/2017   Procedure: VIDEO ASSISTED THORACOSCOPY WITH TALC PLEURODESIS, CHEST TUBE INSERTION;  Surgeon: Nestor Lewandowsky, MD;  Location: [facility] ORS;  Service: Thoracic;  Laterality: Right;   HPI:  Pt is a 71 y.o. 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  PMH also notable for diabetes, hypertension, hyperlipidemia, history of CVA, DVT, GERD, and CKD.  Patient was admitted on 09/07/2018 with new onset seizures with a prolonged post ictal state.  CT of the head revealed bilateral supratentorial and infratentorial intra-axial masses with vasogenic edema consistent with metastatic disease.  Family opted to pursue comfort measures and patient was admitted for same.    Assessment / Plan / Recommendation Clinical Impression  Pt appears to present w/ oropharyngeal phase swallowing deficits impacted by declined Neurological status at this time. A pharyngeal swallow was appreciated after a much delayed period of time when presented again w/ a "dry" spoon trial and given max verbal/tactile cues. Pt appeared to demonstrate decreased awarenss to the task of swallowing - unsure if the reflex of swallowing is being impacted by the disease process (metastatic mesothelioma w/ brain massess and progression) or d/t recent Morphine given for pain. Pt had been eating/drinking until this weekend w/out overt deficits noted. When presented w/ tsp trials of boluses today, pt appeared to orally hold the boluses then suspect bolus spillage into the pharynx w/ a much delayed pharyngeal swallow response despite cues. After long periods of time (~1+ mins) of trial presented, a pharyngeal swallow finally occurred. No overt coughing noted  and adequate vocal quality when he verbalized to someone intermittently. No decline in respiratory status followed the trials; adequate oral clearing was achieved. No further  trials given today d/t concern for aspiration, choking. Thorough discussion was had w/ family members in room on dysphagia, aspiration risk, support and stimulation w/ use of oral swabs at this time. Recommended holding on further sedating medication w/ ongoing assessment tomorrow. Family and MD updated, agreed. NSG updated. ST services will f/u in the morning.  SLP Visit Diagnosis: Dysphagia, oropharyngeal phase (R13.12)(Cognitive decline/impact)    Aspiration Risk  Severe aspiration risk    Diet Recommendation  NPO w/ ongoing assessment of swallowing; safety of an oral diet. Frequent oral care for hygiene and stimulation of swallowing.  Medication Administration: Via alternative means    Other  Recommendations Recommended Consults: (Palliative Care following) Oral Care Recommendations: Oral care QID;Staff/trained caregiver to provide oral care(for oral care and hygiene) Other Recommendations: (TBD)   Follow up Recommendations (TBD)      Frequency and Duration min 3x week  2 weeks       Prognosis Prognosis for Safe Diet Advancement: Guarded Barriers to Reach Goals: Severity of deficits      Swallow Study   General Date of Onset: 09/07/18 HPI: Pt is a 72 y.o. 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  PMH also notable for diabetes, hypertension, hyperlipidemia, history of CVA, DVT, GERD, and CKD.  Patient was admitted on 09/07/2018 with new onset seizures with a prolonged post ictal state.  CT of the head revealed bilateral supratentorial and infratentorial intra-axial masses with vasogenic edema consistent with metastatic disease.  Family opted to pursue comfort measures and patient was admitted for same.  Type of Study: Bedside Swallow Evaluation Previous Swallow Assessment: none Diet Prior to this Study: NPO(regular diet at home until evening of 09/05/2018) Temperature Spikes Noted: No(wbc elevated  at admission) Respiratory Status: Nasal cannula(2 liters) History of Recent Intubation: No Behavior/Cognition: Cooperative;Pleasant mood;Confused;Distractible;Requires cueing(Drowsy) Oral Cavity Assessment: Dry(difficult to fully assess d/t Cognition) Oral Care Completed by SLP: Recent completion by staff Oral Cavity - Dentition: Adequate natural dentition Vision: (n/a) Self-Feeding Abilities: Total assist(moved his hands as if in attempt to feed self) Patient Positioning: Upright in bed Baseline Vocal Quality: Low vocal intensity(mumbled speech) Volitional Cough: Weak(x1 attempt) Volitional Swallow: Unable to elicit    Oral/Motor/Sensory Function Overall Oral Motor/Sensory Function: Generalized oral weakness(no apparent unilateral weakness noted during movements) Facial Symmetry: Within Functional Limits Lingual Symmetry: Within Functional Limits   Ice Chips Ice chips: Impaired Presentation: Spoon(fed; 2 trials) Oral Phase Impairments: Poor awareness of bolus;Reduced lingual movement/coordination Oral Phase Functional Implications: Oral holding;Prolonged oral transit Pharyngeal Phase Impairments: Suspected delayed Swallow(none) Other Comments: did not appreciate a pharyngeal swallow   Thin Liquid Thin Liquid: Not tested    Nectar Thick Nectar Thick Liquid: Impaired Presentation: Spoon(fed; 2 trials) Oral Phase Impairments: Poor awareness of bolus;Reduced lingual movement/coordination Oral phase functional implications: Oral holding;Prolonged oral transit Pharyngeal Phase Impairments: Suspected delayed Swallow(no overt coughing) Other Comments: only appreciated a much delaed pharyngeal swallow when presented again w/ a dry spoon and given max verbal/tactile cues   Honey Thick Honey Thick Liquid: Not tested   Puree Puree: Impaired Presentation: Spoon(fed; 2 trials) Oral Phase Impairments: Poor awareness of bolus;Reduced lingual movement/coordination Oral Phase Functional  Implications: Prolonged oral transit;Oral holding Pharyngeal Phase Impairments: Suspected delayed Swallow(no overt coughing) Other Comments: only appreciated a much delaed pharyngeal swallow when presented again  w/ a dry spoon and given max verbal/tactile cues   Solid     Solid: Not tested      Orinda Kenner, MS, CCC-SLP Terisa Belardo 09/08/2018,3:57 PM

## 2018-09-08 NOTE — Consult Note (Signed)
Pharmacy Antibiotic Note  Ian Shaffer is a 72 y.o. male admitted on 09/07/2018 with aspiration  pneumonia.  Pharmacy has been consulted for Unasyn dosing.  Plan: Start Unasyn 3g IV every 6 hours.   Height: 5\' 10"  (177.8 cm) Weight: 181 lb (82.1 kg) IBW/kg (Calculated) : 73  Temp (24hrs), Avg:96.8 F (36 C), Min:96.8 F (36 C), Max:96.8 F (36 C)  Recent Labs  Lab 09/02/18 1324 09/07/18 0305  WBC 12.2* 15.1*  CREATININE 1.40* 1.39*  LATICACIDVEN  --  6.3*    Estimated Creatinine Clearance: 48.1 mL/min (A) (by C-G formula based on SCr of 1.39 mg/dL (H)).    Allergies  Allergen Reactions  . Sulfa Antibiotics Swelling    Facial swelling No tongue or lips swelling, no difficulty breathing.  . Adhesive [Tape] Itching and Rash  . Amoxicillin Nausea Only and Other (See Comments)    Has patient had a PCN reaction causing immediate rash, facial/tongue/throat swelling, SOB or lightheadedness with hypotension: No Has patient had a PCN reaction causing severe rash involving mucus membranes or skin necrosis: No Has patient had a PCN reaction that required hospitalization: No Has patient had a PCN reaction occurring within the last 10 years: Yes If all of the above answers are "NO", then may proceed with Cephalosporin use.   . Other Rash    Surgical tape    Antimicrobials this admission: 10/14 CTX >> x 1 dose 10/15 Unasyn  >>   Microbiology results: 10/14 UCx: NG Final  Thank you for allowing pharmacy to be a part of this patient's care.  Pernell Dupre, PharmD, BCPS Clinical Pharmacist 09/08/2018 9:59 AM

## 2018-09-09 DIAGNOSIS — R569 Unspecified convulsions: Secondary | ICD-10-CM

## 2018-09-09 DIAGNOSIS — C459 Mesothelioma, unspecified: Secondary | ICD-10-CM

## 2018-09-09 DIAGNOSIS — C7931 Secondary malignant neoplasm of brain: Principal | ICD-10-CM

## 2018-09-09 DIAGNOSIS — Z66 Do not resuscitate: Secondary | ICD-10-CM

## 2018-09-09 MED ORDER — FLUCONAZOLE 100 MG PO TABS
100.0000 mg | ORAL_TABLET | Freq: Every day | ORAL | Status: DC
  Administered 2018-09-09 – 2018-09-10 (×2): 100 mg via ORAL
  Filled 2018-09-09 (×3): qty 1

## 2018-09-09 MED ORDER — LEVETIRACETAM 500 MG PO TABS
500.0000 mg | ORAL_TABLET | Freq: Two times a day (BID) | ORAL | Status: DC
  Administered 2018-09-09 – 2018-09-10 (×2): 500 mg via ORAL
  Filled 2018-09-09 (×3): qty 1

## 2018-09-09 MED ORDER — DEXAMETHASONE 4 MG PO TABS
4.0000 mg | ORAL_TABLET | Freq: Three times a day (TID) | ORAL | Status: DC
  Administered 2018-09-09 – 2018-09-10 (×2): 4 mg via ORAL
  Filled 2018-09-09 (×2): qty 1

## 2018-09-09 NOTE — Clinical Social Work Note (Signed)
Clinical Social Work Assessment  Patient Details  Name: Ian Shaffer MRN: [id number] Date of Birth: [birth year]  Date of referral:  09/09/18               Reason for consult:  Facility Placement                Permission sought to share information with:  Case Manager, Customer service manager, Family Supports Permission granted to share information::  Yes, Verbal Permission Granted  Name::        Agency::     Relationship::     Contact Information:     Housing/Transportation Living arrangements for the past 2 months:  Single Family Home Source of Information:  Spouse Patient Interpreter Needed:  None Criminal Activity/Legal Involvement Pertinent to Current Situation/Hospitalization:  No - Comment as needed Significant Relationships:  Siblings, Other Family Members, Spouse Lives with:  Spouse Do you feel safe going back to the place where you live?  No Need for family participation in patient care:  Yes (Comment)  Care giving concerns:  Patient lives with his wife in [place]    Social Worker assessment / plan:  CSW consulted for residential hospice placement. CSW met with patient, wife and multiple siblings in room. Wife states that they have decided to take patient to the hospice home. CSW inquired about their preference of hospice home and family would like /[facility]. CSW notified Santiago Glad, hospice liaison of referral. CSW will follow for discharge planning.   Employment status:  Retired Forensic scientist:  Medicare PT Recommendations:  Not assessed at this time Information / Referral to community resources:     Patient/Family's Response to care:  Wife and family thanked CSW for assistance   Patient/Family's Understanding of and Emotional Response to Diagnosis, Current Treatment, and Prognosis:  Family understands patient is at end of life and would like comfort care.   Emotional Assessment Appearance:  Appears younger than stated  age Attitude/Demeanor/Rapport:  Lethargic Affect (typically observed):  Flat Orientation:  Oriented to Self, Oriented to Place Alcohol / Substance use:  Not Applicable Psych involvement (Current and /or in the community):  No (Comment)  Discharge Needs  Concerns to be addressed:  Discharge Planning Concerns Readmission within the last 30 days:  Yes Current discharge risk:  None Barriers to Discharge:  Continued Medical Work up   [employer], [place] 09/09/2018, 9:29 AM

## 2018-09-09 NOTE — Consult Note (Signed)
[place] CONSULT NOTE  Patient Care Team: Derinda Late, MD as PCP - General (Family Medicine) Margaretha Sheffield, MD (Otolaryngology) Telford Nab, RN as Registered Nurse Nestor Lewandowsky, MD as Referring Physician (Cardiothoracic Surgery) Cammie Sickle, MD as Consulting Physician (Internal Medicine)  CHIEF COMPLAINTS/PURPOSE OF CONSULTATION:  Metastatic mesothelioma  HISTORY OF PRESENTING ILLNESS: Patient is drowsy but arousable.  Accompanied by multiple family members. Ian Shaffer 72 y.o.  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  On evaluation the hospital with a CT scan patient noted to have bilateral approximately 2.5 cm metastatic lesions.  Patient was initially started on morphine drip for comfort measures.  However with steroids-patient mentation improved.  Is currently off morphine drip.  He denies any pain.  Denies any nausea vomiting.  Complains of poor appetite positive for weight loss.  Patient has been evaluated by hospice and also part of care.  Review of system unable to assess given patient's mentation  MEDICAL HISTORY:  Past Medical History:  Diagnosis Date  . Benign essential hypertension 05/31/2014  . Cancer ([place]) 06/2017   right lung   . Cerebral infarction ([place]) 05/31/2014  . Cerebrovascular disease 05/31/2014  . CKD (chronic kidney disease) stage 3, GFR 30-59 ml/min (HCC) 02/23/2016  . CVA (cerebral infarction)   . Diabetes mellitus without complication ([place])   . Difficult intubation   . DVT (deep venous thrombosis) ([place])   . Esophageal reflux 05/31/2014  . H/O: CVA (cerebrovascular accident)   . Hyperlipidemia   . Hypertension   . ICAO (internal carotid artery occlusion) March 01, 2014   Left  . 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 Pleural effusion  04/23/2017  . Stroke [facility]) April, 4,2015  . Type 2 diabetes mellitus with peripheral neuropathy ([place]) 02/23/2016  . Weakness     SURGICAL HISTORY: Past Surgical History:  Procedure Laterality Date  . CHEST TUBE INSERTION N/A 06/25/2017   Procedure: MWUXLK CATHETER INSERTION;  Surgeon: Nestor Lewandowsky, MD;  Location: [facility] ORS;  Service: Thoracic;  Laterality: N/A;  . CHEST TUBE INSERTION Right 08/07/2017   Procedure: REMOVAL OF PLEUR X CATHETER;  Surgeon: Nestor Lewandowsky, MD;  Location: [facility] ORS;  Service: General;  Laterality: Right;  . COLONOSCOPY WITH PROPOFOL N/A 08/30/2018   Procedure: COLONOSCOPY WITH PROPOFOL;  Surgeon: Jonathon Bellows, MD;  Location: [facility] ENDOSCOPY;  Service: Gastroenterology;  Laterality: N/A;  . ESOPHAGOGASTRODUODENOSCOPY N/A 08/28/2018   Procedure: ESOPHAGOGASTRODUODENOSCOPY (EGD);  Surgeon: Virgel Manifold, MD;  Location: [facility] ENDOSCOPY;  Service: Endoscopy;  Laterality: N/A;  . NASAL SINUS SURGERY  2000  . TONSILLECTOMY    . VIDEO ASSISTED THORACOSCOPY Right 06/25/2017   Procedure: VIDEO ASSISTED THORACOSCOPY WITH TALC PLEURODESIS, CHEST TUBE INSERTION;  Surgeon: Nestor Lewandowsky, MD;  Location: [facility] ORS;  Service: Thoracic;  Laterality: Right;    SOCIAL HISTORY: Social History   Socioeconomic History  . Marital status: Married    Spouse name: Vaughan Basta  . Number of children: Not on file  . Years of education: Not on file  . Highest education level: Not on file  Occupational History  . Not on file  Social Needs  . Financial resource strain: Not hard at all  . Food insecurity:    Worry: Patient refused    Inability: Patient refused  . Transportation needs:    Medical: Patient refused    Non-medical: Patient refused  Tobacco Use  . Smoking status: Former Smoker    Types: Pipe    Last attempt to quit: 05/07/1974    Years since quitting: 44.3  . Smokeless tobacco: Never Used  . Tobacco comment: pt states he smoked a pipe a long time ago  Substance and Sexual  Activity  . Alcohol use: No  . Drug use: No  . Sexual activity: Not Currently  Lifestyle  . Physical activity:    Days per week: Patient refused    Minutes per session: Patient refused  . Stress: Only a little  Relationships  . Social connections:    Talks on phone: Patient refused    Gets together: Patient refused    Attends religious service: Patient refused    Active member of club or organization: Patient refused    Attends meetings of clubs or organizations: Patient refused    Relationship status: Patient refused  . Intimate partner violence:    Fear of current or ex partner: Patient refused    Emotionally abused: Patient refused    Physically abused: Patient refused    Forced sexual activity: Patient refused  Other Topics Concern  . Not on file  Social History Narrative  . Not on file    FAMILY HISTORY: Family History  Problem Relation Age of Onset  . Hypertension Mother   . Hypertension Father     ALLERGIES:  is allergic to sulfa antibiotics; adhesive [tape]; amoxicillin; and other.  MEDICATIONS:  Current Facility-Administered Medications  Medication Dose Route Frequency Provider Last Rate Last Dose  . antiseptic oral rinse (BIOTENE) solution 15 mL  15 mL Topical PRN Arta Silence, MD      . bisacodyl (DULCOLAX) suppository 10 mg  10 mg Rectal Daily PRN Arta Silence, MD      . dexamethasone (DECADRON) tablet 4 mg  4 mg Oral Q8H Wieting, Richard, MD      . diphenhydrAMINE (BENADRYL) injection 12.5 mg  12.5 mg Intravenous Q4H PRN Arta Silence, MD      . fluconazole (DIFLUCAN) tablet 100 mg  100 mg Oral Daily Loletha Grayer, MD   100 mg at 09/09/18 1917  . glycopyrrolate (ROBINUL) injection 0.2 mg  0.2 mg Intravenous Q4H PRN Arta Silence, MD      . ipratropium-albuterol (DUONEB) 0.5-2.5 (3) MG/3ML nebulizer solution 3 mL  3 mL Nebulization Q6H Wieting, Richard, MD   3 mL at 09/09/18 1941  . levETIRAcetam (KEPPRA) tablet 500 mg  500 mg  Oral BID Wieting, Richard, MD      . LORazepam (ATIVAN) injection 1-2 mg  1-2 mg Intravenous Q2H PRN Borders, Kirt Boys, NP      . MEDLINE mouth rinse  15 mL Mouth Rinse BID Loletha Grayer, MD   15 mL at 09/09/18 0950  . morphine 2 MG/ML injection 2 mg  2 mg Intravenous Q4H PRN Wieting, Richard, MD      . ondansetron (ZOFRAN) injection 4 mg  4 mg Intravenous Q6H PRN Arta Silence, MD      . polyvinyl alcohol (LIQUIFILM TEARS) 1.4 % ophthalmic solution 1 drop  1 drop Both Eyes QID PRN Arta Silence, MD          .  PHYSICAL EXAMINATION:  Vitals:   09/09/18 0212 09/09/18 0443  BP:  123/62  Pulse:  76  Resp:  18  Temp:  98.3 F (36.8 C)  SpO2: 91% 96%   Filed Weights   09/07/18 0302  Weight: 181 lb (82.1 kg)  GENERAL: Ill-appearing Caucasian male patient.  He is in no acute distress.  Accompanied by multiple family members including brother wife and other family. EYES: no pallor or icterus OROPHARYNX: no thrush or ulceration. NECK: supple, no masses felt LYMPH:  no palpable lymphadenopathy in the cervical, axillary or inguinal regions LUNGS: decreased breath sounds to auscultation at bases and  No wheeze or crackles HEART/CVS: regular rate & rhythm and no murmurs; No lower extremity edema ABDOMEN: abdomen soft, non-tender and normal bowel sounds Musculoskeletal:no cyanosis of digits and no clubbing  PSYCH: Drowsy; easily arousable. NEURO: Limited exam given patient's mentation. SKIN:  no rashes or significant lesions  LABORATORY DATA:  I have reviewed the data as listed Lab Results  Component Value Date   WBC 15.1 (H) 09/07/2018   HGB 8.7 (L) 09/07/2018   HCT 28.0 (L) 09/07/2018   MCV 88.6 09/07/2018   PLT 463 (H) 09/07/2018   Recent Labs    05/07/18 1015  08/27/18 1238  08/29/18 0430 09/02/18 1324 09/07/18 0305  NA  --    < > 139   < > 139 137 138  K  --    < > 4.5   < > 4.5 4.5 3.9  CL  --    < > 103   < > 103 101 100  CO2  --    < > 25   < >  28 26 22   GLUCOSE  --    < > 129*   < > 139* 216* 215*  BUN  --    < > 26*   < > 21 21 21   CREATININE  --    < > 1.35*   < > 1.57* 1.40* 1.39*  CALCIUM  --    < > 9.8   < > 8.8* 9.2 9.4  GFRNONAA  --    < > 50*   < > 42* 48* 48*  GFRAA  --    < > 58*   < > 48* 56* 56*  PROT 6.6   < > 7.4  --   --  6.7 7.2  ALBUMIN 3.3*   < > 3.3*  --   --  3.1* 3.1*  AST 33   < > 37  --   --  39 37  ALT 33   < > 22  --   --  29 22  ALKPHOS 110   < > 300*  --   --  323* 276*  BILITOT 0.8   < > 0.5  --   --  0.5 0.5  BILIDIR <0.1*  --   --   --   --   --   --   IBILI NOT CALCULATED  --   --   --   --   --   --    < > = values in this interval not displayed.    RADIOGRAPHIC STUDIES: I have personally reviewed the radiological images as listed and agreed with the findings in the report. Dg Chest 2 View  Result Date: 08/28/2018 CLINICAL DATA:  Fever today.  History of lung cancer. EXAM: CHEST - 2 VIEW COMPARISON:  Chest x-ray 08/11/2018, chest CT 08/03/2018 FINDINGS: Cardiomediastinal silhouette is normal. Mediastinal contours appear intact. There is no evidence of focal airspace consolidation, pleural effusion or pneumothorax. Interval enlargement of multiple bilateral pulmonary masses. The largest mass in the left lower lung field measures 4.0 cm from prior measurement of 3.6 cm on chest x-ray dated 08/11/2018.  Stable volume loss in the right hemithorax. Right peri diaphragmatic mass has also increased in size. Osseous structures are without acute abnormality. Soft tissues are grossly normal. IMPRESSION: Worsening pulmonary metastatic disease. Right pleural thickening versus pleural effusion. Electronically Signed   By: Fidela Salisbury M.D.   On: 08/28/2018 17:30   Dg Chest 2 View  Result Date: 08/11/2018 CLINICAL DATA:  History of mesothelioma, chest pain and fever EXAM: CHEST - 2 VIEW COMPARISON:  CT chest of 08/03/2018 and chest x-ray of 05/20/2017 FINDINGS: Multiple lung nodules again are noted  throughout the lungs consistent with diffuse metastatic involvement of the lungs. The largest nodule at the left lung base currently measures 36 mm in diameter compared to prior measurement of approximately 30 mm on recent CT. This may be some discrepancy in matter measurement, but the lesions have certainly not diminished in size. A tiny right pleural effusion cannot be excluded. Mediastinal and hilar contours are unchanged and heart size is stable. No bony abnormality is noted other than degenerative change throughout the thoracic spine. IMPRESSION: Diffuse lung metastases again are noted possibly slightly larger. No pneumonia is seen. A tiny right pleural effusion cannot be excluded Electronically Signed   By: Ivar Drape M.D.   On: 08/11/2018 10:00   Ct Head Wo Contrast  Result Date: 09/07/2018 CLINICAL DATA:  Altered mental status, fever and possible seizures. EXAM: CT HEAD WITHOUT CONTRAST TECHNIQUE: Contiguous axial images were obtained from the base of the skull through the vertex without intravenous contrast. COMPARISON:  None. FINDINGS: Brain: Bilateral nonhemorrhagic intra-axial lesions with vasogenic edema are identified. Representative lesions are identified in the left occipital lobe measuring 1.5 x 1.9 by 1.6 cm, right temporal lobe measuring approximately 1.1 cm in diameter, left frontal lobe measuring 0.7 cm in diameter, near the GU of the right lateral ventricle measuring 0.6 cm in diameter, and the largest in the right high parietal lobe measuring approximately 2.4 x 1.7 x 1.8 cm. Bilateral cerebellar vasogenic edema is also noted left more prominent than right lobe masses are less discretely visualized on this unenhanced study. No hydrocephalus. No extra-axial fluid. No midline shift or evidence of herniation. Midline fourth ventricle and basal cisterns without effacement. Vascular: No hyperdense vessel sign. Skull: No fracture or aggressive osseous lesions. Sinuses/Orbits: Polypoid mucosal  thickening/mucous retention cyst of the right maxillary sinus. Intact orbits and globes. Other: None IMPRESSION: Bilateral supratentorial and infratentorial intra-axial masses with vasogenic edema consistent with metastatic disease. No midline shift or herniation identified. No hydrocephalus. Electronically Signed   By: Ashley Royalty M.D.   On: 09/07/2018 04:02   Dg Chest Port 1 View  Result Date: 09/07/2018 CLINICAL DATA:  Altered mental status. Fever and possible seizure. History of lung cancer. EXAM: PORTABLE CHEST 1 VIEW COMPARISON:  None. FINDINGS: Heart size and mediastinal contours are stable and within normal limits. No pulmonary consolidation nor edema. Redemonstration of bilateral pulmonary masses, the largest approximately 5.3 cm at the right lung base and 4.1 cm in diameter at the left lung base. Trace right effusion/blunting of the right lateral costophrenic angle is again noted. No aggressive osseous lesions. IMPRESSION: Bilateral pulmonary masses compatible with metastasis. No acute pulmonary consolidation or edema. Electronically Signed   By: Ashley Royalty M.D.   On: 09/07/2018 03:43   US Abdomen Limited Ruq  Result Date: 08/11/2018 CLINICAL DATA:  Upper abdominal pain. History of malignant mesothelioma EXAM: ULTRASOUND ABDOMEN LIMITED RIGHT UPPER QUADRANT COMPARISON:  PET-CT July 09, 2017. Chest CT including portions  of the upper abdomen August 03, 2018 FINDINGS: Gallbladder: No gallstones or wall thickening visualized. There is no pericholecystic fluid. No sonographic Murphy sign noted by sonographer. Common bile duct: Diameter: 3 mm. No intrahepatic or extrahepatic biliary duct dilatation. Liver: There are mass lesions in the liver, felt to represent metastatic foci. The largest of these lesions is seen in the anterior segment right lobe of the liver measuring 14.8 x 8.5 x 13.8 cm. A second lesion in the right lobe measures 6.8 x 3.8 x 5.8 cm. A third lesion arising eccentrically from the  right lobe of the liver measures 2.2 x 2.0 x 1.9 cm. Within normal limits in parenchymal echogenicity. Portal vein is patent on color Doppler imaging with normal direction of blood flow towards the liver. Right kidney appears somewhat echogenic. IMPRESSION: 1. Widespread metastatic disease throughout the right lobe of the liver. The larger lesions have a somewhat infiltrative appearance. 2. Right kidney appears somewhat echogenic. Question a degree of medical renal disease. 3. No demonstrable gallbladder pathology or biliary duct dilatation. Electronically Signed   By: Lowella Grip III M.D.   On: 08/11/2018 13:35    ASSESSMENT & PLAN:   # 72 year old male patient history of mesothelioma currently admitted to hospital for seizures.  # Metastatic mesothelioma-most recently progressed on third line chemotherapy with gemcitabine. Given the limited options/and limited response rates of any further lines of therapy-further chemotherapy is not recommended.  See discussion below.  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  Discussed that radiation therapy is an option; however given the lack of good control of the disease in the rest of the body, I do not think patient would benefit from radiation therapy to the brain.  Recommend steroids/antiepileptics.  # Overall prognosis is extremely poor.  DNR/DNI. Especially with a new diagnosis of metastatic disease to the brain.  Patient would be a candidate for hospice home admission.  Discussed with patient and family in detail.  Also discussed with with Josh borders palliative care provider.   # Thank you Dr.Weiting for allowing me to participate in the care of your pleasant patient. All questions were answered.   # I reviewed the blood work- with the patient in detail; also reviewed the imaging independently [as summarized above]; and with the patient in detail.      Cammie Sickle, MD 09/09/2018 9:12 PM

## 2018-09-09 NOTE — Progress Notes (Signed)
Speech Language Pathology Treatment: Dysphagia  Patient Details Name: Ian Shaffer MRN: [id number] DOB: 1946-04-28 Today's Date: 09/09/2018 Time: 1203-1300 SLP Time Calculation (min) (ACUTE ONLY): 57 min  Assessment / Plan / Recommendation Clinical Impression  Pt seen for ongoing assessment of swallowing and trials to advance to least restrictive po diet if possible. Multiple family members present including Wife, brother. Pt appeared more alert and awake; another day w/out Morphine impacting his alertness. Pt was able to verbally respond; eyes opened consistently today during session. Pt continues to demonstrate a slower response time to basic questions and answers w/ a 1-3 word response in low volume of speech.  Pt appears to present w/ much improved Cognitive awareness for po tasks but continues to present w/ mild+ oral phase dysphagia suspect impacted by his declined Cognitive status; any oral phase deficits can impact the pharyngeal phase of swallowing. This presentation can increase risk for aspiration. Pt was presented w/ trials of ice chips, thin liquids via cup/bottle(baseline for pt at home), purees and softened solids w/ no overt s/s of aspiration noted. A more timely pharyngeal swallow was appreciated w/ less delayed oral phase time - pt was given min-mod verbal/tactile cues to encouraged mastication and oral clearing of the increased textured trials (soft solids). With all other trials, pt accepted the boluses, orally manipulated the boluses, then transferred and swallowed the boluses. Oral clearing was achieved b/t trials. With the soft solid trials, pt demonstrated a slower oral phase time w/ min holding; pt appeared to demonstrate decreased awarenss to the task of mastication and bolus management(unsure if related to his disease process of metastatic mesothelioma w/ brain massess and progression) or continued fatigued (pt has been having ongoing visitors every day). With all bolus  trials, no overt coughing noted and adequate vocal quality when he verbalized to post trials. No decline in respiratory status followed the trials; adequate oral clearing was achieved.  Recommend a mixed diet of Dysphagia level 3(mech soft) w/ level 2(Minced meats) and even Puree foods w/ Thin liquids via Cup; aspiration precautions; support at meals w/ po intake but reducing distracttions during meals. Thorough discussion was had w/ family members in room on aspiration precautions, dysphagia, aspiration risk, support; food consistencies and preparation easiest for conservation of energy. Family and MD updated, agreed. NSG updated. ST services will f/u w/ toleration of diet; education w/ pt/family.     HPI HPI: Pt is a 72 y.o. 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  PMH also notable for diabetes, hypertension, hyperlipidemia, history of CVA, DVT, GERD, and CKD.  Patient was admitted on 09/07/2018 with new onset seizures with a prolonged post ictal state.  CT of the head revealed bilateral supratentorial and infratentorial intra-axial masses with vasogenic edema consistent with metastatic disease.  Family opted to pursue comfort measures and patient was admitted for same.  Pt initially received Morphine since admission for discomfort which has now been discontinued since 09/08/2018. This allows pt to be more alert/awake, responsive to family and staff.       SLP Plan  Continue with current plan of care(education w/ family)       Recommendations  Diet recommendations: Dysphagia 3 (mechanical soft);Thin liquid(minced meats) Liquids provided via: Cup;No straw Medication Administration: Crushed with puree(or in liquid form) Supervision: Patient able to self feed;Staff to assist with self feeding;Full supervision/cueing for compensatory strategies Compensations: Minimize environmental distractions;Slow rate;Small  sips/bites;Lingual sweep for clearance of pocketing;Multiple dry swallows  after each bite/sip;Follow solids with liquid Postural Changes and/or Swallow Maneuvers: Seated upright 90 degrees;Upright 30-60 min after meal                General recommendations: (dietician f/u; Palliative Care following) Oral Care Recommendations: Oral care BID;Staff/trained caregiver to provide oral care Follow up Recommendations: None SLP Visit Diagnosis: Dysphagia, oropharyngeal phase (R13.12)(impact from Cognitive decline) Plan: Continue with current plan of care(education w/ family)       GO                 Orinda Kenner, [place], CCC-SLP Mikaiah Stoffer 09/09/2018, 1:03 PM

## 2018-09-09 NOTE — Progress Notes (Signed)
[place]  Telephone:([phone] [phone]   Name: Ian Shaffer Date: 09/09/2018 MRN: [id number]  DOB: November 28, 1945  Patient Care Team: Derinda Late, MD as PCP - General (Family Medicine) Margaretha Sheffield, MD (Otolaryngology) Telford Nab, RN as Registered Nurse Nestor Lewandowsky, MD as Referring Physician (Cardiothoracic Surgery) Cammie Sickle, MD as Consulting Physician (Internal Medicine)    REASON FOR CONSULTATION: Palliative Care consult requested for this 72 y.o. 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  PMH also notable for diabetes, hypertension, hyperlipidemia, history of CVA, DVT, and CKD.  Patient was admitted on 09/07/2018 with new onset seizures with a prolonged post ictal state.  CT of the head revealed bilateral supratentorial and infratentorial intra-axial masses with vasogenic edema consistent with metastatic disease.  Family opted to pursue comfort measures and patient was admitted for same.  Palliative care was consulted to help ensure patient's comfort and address goals.  CODE STATUS: DNR  PAST MEDICAL HISTORY: Past Medical History:  Diagnosis Date  . Benign essential hypertension 05/31/2014  . Cancer ([place]) 06/2017   right lung   . Cerebral infarction ([place]) 05/31/2014  . Cerebrovascular disease 05/31/2014  . CKD (chronic kidney disease) stage 3, GFR 30-59 ml/min (HCC) 02/23/2016  . CVA (cerebral infarction)   . Diabetes mellitus without complication ([place])   . Difficult intubation   . DVT (deep venous thrombosis) ([place])   . Esophageal reflux 05/31/2014  . H/O: CVA (cerebrovascular accident)   . Hyperlipidemia   . Hypertension   . ICAO (internal carotid artery occlusion) March 01, 2014   Left  . 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 Pleural effusion 04/23/2017  . Stroke [facility]) April, 4,2015    . Type 2 diabetes mellitus with peripheral neuropathy ([place]) 02/23/2016  . Weakness     PAST SURGICAL HISTORY:  Past Surgical History:  Procedure Laterality Date  . CHEST TUBE INSERTION N/A 06/25/2017   Procedure: HTDSKA CATHETER INSERTION;  Surgeon: Nestor Lewandowsky, MD;  Location: [facility] ORS;  Service: Thoracic;  Laterality: N/A;  . CHEST TUBE INSERTION Right 08/07/2017   Procedure: REMOVAL OF PLEUR X CATHETER;  Surgeon: Nestor Lewandowsky, MD;  Location: [facility] ORS;  Service: General;  Laterality: Right;  . COLONOSCOPY WITH PROPOFOL N/A 08/30/2018   Procedure: COLONOSCOPY WITH PROPOFOL;  Surgeon: Jonathon Bellows, MD;  Location: [facility] ENDOSCOPY;  Service: Gastroenterology;  Laterality: N/A;  . ESOPHAGOGASTRODUODENOSCOPY N/A 08/28/2018   Procedure: ESOPHAGOGASTRODUODENOSCOPY (EGD);  Surgeon: Virgel Manifold, MD;  Location: [facility] ENDOSCOPY;  Service: Endoscopy;  Laterality: N/A;  . NASAL SINUS SURGERY  2000  . TONSILLECTOMY    . VIDEO ASSISTED THORACOSCOPY Right 06/25/2017   Procedure: VIDEO ASSISTED THORACOSCOPY WITH TALC PLEURODESIS, CHEST TUBE INSERTION;  Surgeon: Nestor Lewandowsky, MD;  Location: [facility] ORS;  Service: Thoracic;  Laterality: Right;    HEMATOLOGY/ONCOLOGY HISTORY:  Oncology History   # July-AUG 2018- right pleural based mass ~10 cm [ above & below diaphragm]; s/p VATS- Dr.Oaks-Bx- 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   # JUNE 2019- GEM; SEP 9th CT scan- Progression;  # SEP 2019- Severe Anemia/malena [EGD/colo-NEG]  # Hx of CVA Right hand; no residual deficits [left sided carotid block; no surgery done]- on Asa/plavix; CKD- stage III; DM-2; ? PN  # MOLECULAR TESTING- PDL-1- 1% [LOW]; [facility]- No actionable**  ----------------------------------------------------   DIAGNOSIS: [AUG 2019 ] ? [name]   STAGE:  IV  ;GOALS: Palliative  CURRENT/MOST RECENT THERAPY-declined vinorelbine     [name], malignant ([place])    ALLERGIES:  is allergic to sulfa antibiotics; adhesive [tape]; amoxicillin; and other.  MEDICATIONS:  Current Facility-Administered Medications  Medication Dose Route Frequency Provider Last Rate Last Dose  . Ampicillin-Sulbactam (UNASYN) 3 g in sodium chloride 0.9 % 100 mL IVPB  3 g Intravenous Q6H Hallaji, Sheema M, RPH 200 mL/hr at 09/09/18 0954 3 g at 09/09/18 0954  . antiseptic oral rinse (BIOTENE) solution 15 mL  15 mL Topical PRN Arta Silence, MD      . bisacodyl (DULCOLAX) suppository 10 mg  10 mg Rectal Daily PRN Arta Silence, MD      . dexamethasone (DECADRON) injection 4 mg  4 mg Intravenous Q6H Arta Silence, MD   4 mg at 09/09/18 0948  . diphenhydrAMINE (BENADRYL) injection 12.5 mg  12.5 mg Intravenous Q4H PRN Arta Silence, MD      . glycopyrrolate (ROBINUL) injection 0.2 mg  0.2 mg Intravenous Q4H PRN Jodell Cipro, Prasanna, MD      . ipratropium-albuterol (DUONEB) 0.5-2.5 (3) MG/3ML nebulizer solution 3 mL  3 mL Nebulization Q6H Wieting, Richard, MD   3 mL at 09/09/18 0750  . levETIRAcetam (KEPPRA) 500 mg in sodium chloride 0.9 % 100 mL IVPB  500 mg Intravenous Q12H Loletha Grayer, MD 420 mL/hr at 09/08/18 2358 500 mg at 09/08/18 2358  . LORazepam (ATIVAN) injection 1-2 mg  1-2 mg Intravenous Q2H PRN Katieann Hungate, Kirt Boys, NP      . MEDLINE mouth rinse  15 mL Mouth Rinse BID Loletha Grayer, MD   15 mL at 09/09/18 0950  .  morphine 2 MG/ML injection 2 mg  2 mg Intravenous Q4H PRN Loletha Grayer, MD      . ondansetron (ZOFRAN) injection 4 mg  4 mg Intravenous Q6H PRN Arta Silence, MD      . polyvinyl alcohol (LIQUIFILM TEARS) 1.4 % ophthalmic solution 1 drop  1 drop Both Eyes QID PRN Arta Silence, MD        VITAL SIGNS: BP 123/62 (BP Location: Right Arm)   Pulse 76   Temp 98.3 F (36.8 C) (Oral)   Resp 18   Ht 5' 10"  (1.778 m)   Wt 181 lb (82.1 kg)   SpO2 96%   BMI 25.97 kg/m  Filed Weights   09/07/18 0302  Weight: 181 lb (82.1 kg)    Estimated body mass index is 25.97 kg/m as calculated from the following:   Height as of this encounter: 5' 10"  (1.778 m).   Weight as of this encounter: 181 lb (82.1 kg).  LABS: CBC:    Component Value Date/Time   WBC 15.1 (H) 09/07/2018 0305   HGB 8.7 (L) 09/07/2018 0305   HCT 28.0 (L) 09/07/2018 0305   PLT 463 (H) 09/07/2018 0305   MCV 88.6 09/07/2018 0305   NEUTROABS 12.2 (H) 09/07/2018 0305   LYMPHSABS 1.6 09/07/2018 0305   MONOABS 1.0 09/07/2018 0305   EOSABS 0.1 09/07/2018  0305   BASOSABS 0.1 09/07/2018 0305   Comprehensive Metabolic Panel:    Component Value Date/Time   NA 138 09/07/2018 0305   K 3.9 09/07/2018 0305   CL 100 09/07/2018 0305   CO2 22 09/07/2018 0305   BUN 21 09/07/2018 0305   CREATININE 1.39 (H) 09/07/2018 0305   GLUCOSE 215 (H) 09/07/2018 0305   CALCIUM 9.4 09/07/2018 0305   AST 37 09/07/2018 0305   ALT 22 09/07/2018 0305   ALKPHOS 276 (H) 09/07/2018 0305   BILITOT 0.5 09/07/2018 0305   PROT 7.2 09/07/2018 0305   ALBUMIN 3.1 (L) 09/07/2018 0305    RADIOGRAPHIC STUDIES: Dg Chest 2 View  Result Date: 08/28/2018 CLINICAL DATA:  Fever today.  History of lung cancer. EXAM: CHEST - 2 VIEW COMPARISON:  Chest x-ray 08/11/2018, chest CT 08/03/2018 FINDINGS: Cardiomediastinal silhouette is normal. Mediastinal contours appear intact. There is no evidence of focal airspace consolidation, pleural effusion or  pneumothorax. Interval enlargement of multiple bilateral pulmonary masses. The largest mass in the left lower lung field measures 4.0 cm from prior measurement of 3.6 cm on chest x-ray dated 08/11/2018. Stable volume loss in the right hemithorax. Right peri diaphragmatic mass has also increased in size. Osseous structures are without acute abnormality. Soft tissues are grossly normal. IMPRESSION: Worsening pulmonary metastatic disease. Right pleural thickening versus pleural effusion. Electronically Signed   By: Fidela Salisbury M.D.   On: 08/28/2018 17:30   Dg Chest 2 View  Result Date: 08/11/2018 CLINICAL DATA:  History of mesothelioma, chest pain and fever EXAM: CHEST - 2 VIEW COMPARISON:  CT chest of 08/03/2018 and chest x-ray of 05/20/2017 FINDINGS: Multiple lung nodules again are noted throughout the lungs consistent with diffuse metastatic involvement of the lungs. The largest nodule at the left lung base currently measures 36 mm in diameter compared to prior measurement of approximately 30 mm on recent CT. This may be some discrepancy in matter measurement, but the lesions have certainly not diminished in size. A tiny right pleural effusion cannot be excluded. Mediastinal and hilar contours are unchanged and heart size is stable. No bony abnormality is noted other than degenerative change throughout the thoracic spine. IMPRESSION: Diffuse lung metastases again are noted possibly slightly larger. No pneumonia is seen. A tiny right pleural effusion cannot be excluded Electronically Signed   By: Ivar Drape M.D.   On: 08/11/2018 10:00   Ct Head Wo Contrast  Result Date: 09/07/2018 CLINICAL DATA:  Altered mental status, fever and possible seizures. EXAM: CT HEAD WITHOUT CONTRAST TECHNIQUE: Contiguous axial images were obtained from the base of the skull through the vertex without intravenous contrast. COMPARISON:  None. FINDINGS: Brain: Bilateral nonhemorrhagic intra-axial lesions with vasogenic  edema are identified. Representative lesions are identified in the left occipital lobe measuring 1.5 x 1.9 by 1.6 cm, right temporal lobe measuring approximately 1.1 cm in diameter, left frontal lobe measuring 0.7 cm in diameter, near the GU of the right lateral ventricle measuring 0.6 cm in diameter, and the largest in the right high parietal lobe measuring approximately 2.4 x 1.7 x 1.8 cm. Bilateral cerebellar vasogenic edema is also noted left more prominent than right lobe masses are less discretely visualized on this unenhanced study. No hydrocephalus. No extra-axial fluid. No midline shift or evidence of herniation. Midline fourth ventricle and basal cisterns without effacement. Vascular: No hyperdense vessel sign. Skull: No fracture or aggressive osseous lesions. Sinuses/Orbits: Polypoid mucosal thickening/mucous retention cyst of the right maxillary sinus. Intact orbits and globes.  Other: None IMPRESSION: Bilateral supratentorial and infratentorial intra-axial masses with vasogenic edema consistent with metastatic disease. No midline shift or herniation identified. No hydrocephalus. Electronically Signed   By: Ashley Royalty M.D.   On: 09/07/2018 04:02   Dg Chest Port 1 View  Result Date: 09/07/2018 CLINICAL DATA:  Altered mental status. Fever and possible seizure. History of lung cancer. EXAM: PORTABLE CHEST 1 VIEW COMPARISON:  None. FINDINGS: Heart size and mediastinal contours are stable and within normal limits. No pulmonary consolidation nor edema. Redemonstration of bilateral pulmonary masses, the largest approximately 5.3 cm at the right lung base and 4.1 cm in diameter at the left lung base. Trace right effusion/blunting of the right lateral costophrenic angle is again noted. No aggressive osseous lesions. IMPRESSION: Bilateral pulmonary masses compatible with metastasis. No acute pulmonary consolidation or edema. Electronically Signed   By: Ashley Royalty M.D.   On: 09/07/2018 03:43   US Abdomen  Limited Ruq  Result Date: 08/11/2018 CLINICAL DATA:  Upper abdominal pain. History of malignant mesothelioma EXAM: ULTRASOUND ABDOMEN LIMITED RIGHT UPPER QUADRANT COMPARISON:  PET-CT July 09, 2017. Chest CT including portions of the upper abdomen August 03, 2018 FINDINGS: Gallbladder: No gallstones or wall thickening visualized. There is no pericholecystic fluid. No sonographic Murphy sign noted by sonographer. Common bile duct: Diameter: 3 mm. No intrahepatic or extrahepatic biliary duct dilatation. Liver: There are mass lesions in the liver, felt to represent metastatic foci. The largest of these lesions is seen in the anterior segment right lobe of the liver measuring 14.8 x 8.5 x 13.8 cm. A second lesion in the right lobe measures 6.8 x 3.8 x 5.8 cm. A third lesion arising eccentrically from the right lobe of the liver measures 2.2 x 2.0 x 1.9 cm. Within normal limits in parenchymal echogenicity. Portal vein is patent on color Doppler imaging with normal direction of blood flow towards the liver. Right kidney appears somewhat echogenic. IMPRESSION: 1. Widespread metastatic disease throughout the right lobe of the liver. The larger lesions have a somewhat infiltrative appearance. 2. Right kidney appears somewhat echogenic. Question a degree of medical renal disease. 3. No demonstrable gallbladder pathology or biliary duct dilatation. Electronically Signed   By: Lowella Grip III M.D.   On: 08/11/2018 13:35    PERFORMANCE STATUS (ECOG) : 3 - Symptomatic, >50% confined to bed  Review of Systems Unable to provide  Physical Exam General: ill appearing Cardiovascular: regular rate and rhythm Pulmonary: Clear to auscultation Abdomen: soft, nontender, + bowel sounds GU: no suprapubic tenderness Extremities: no edema, no joint deformities Skin: no rashes Neurological: alert, answers simple questions, follows simple commands, he is confused  IMPRESSION: Patient's mental status is improved.  He  is alert and conversing with family.  However, he remains confused.  Case discussed with Dr. Rogue Bussing, who also spoke with family this morning.  There are no good treatment options available.  I met with patient's wife who reaffirms her desire to ensure the patient is comfortable.  She recognizes that patient is likely approaching end-of-life.  She had previously expressed a desire to take him home with hospice care.  However, she now recognizes that taking him home would not be a good option.  Alternatively she would like to pursue transfer to residential hospice.  PLAN: Social work consult to facilitate transfer to hospice   Time Total: 20 minutes  Visit consisted of counseling and education dealing with the complex and emotionally intense issues of symptom management and palliative care in the  setting of serious and potentially life-threatening illness.Greater than 50%  of this time was spent counseling and coordinating care related to the above assessment and plan.  Signed by: Altha Harm, [place], NP-C, [place] (Work Cell)

## 2018-09-09 NOTE — Progress Notes (Signed)
Patient ID: Ian Shaffer, male   DOB: April 26, 1946, 72 y.o.   MRN: [id number]  [employer] PROGRESS NOTE  Ian Shaffer IEP:[id number] DOB: [birth year] DOA: 09/07/2018 PCP: Derinda Late, MD  HPI/Subjective: Patient able to follow commands today.  Has some pain in the abdomen when palpating.  No shortness of breath or cough.  Objective: Vitals:   09/09/18 0212 09/09/18 0443  BP:  123/62  Pulse:  76  Resp:  18  Temp:  98.3 F (36.8 C)  SpO2: 91% 96%    Filed Weights   09/07/18 0302  Weight: 82.1 kg    ROS: Review of Systems  Unable to perform ROS: Acuity of condition  Respiratory: Negative for shortness of breath.   Cardiovascular: Negative for chest pain.  Gastrointestinal: Positive for abdominal pain.  Musculoskeletal: Negative for joint pain.   Exam: Physical Exam  HENT:  Nose: No mucosal edema.  Mouth/Throat: No oropharyngeal exudate or posterior oropharyngeal edema.  Eyes: Pupils are equal, round, and reactive to light. Conjunctivae and lids are normal.  Unable to move his eyes to the left  Neck: No JVD present. Carotid bruit is not present. No edema present. No thyroid mass and no thyromegaly present.  Cardiovascular: S1 normal and S2 normal. Exam reveals no gallop.  No murmur heard. Pulses:      Dorsalis pedis pulses are 2+ on the right side, and 2+ on the left side.  Respiratory: No respiratory distress. He has decreased breath sounds in the right lower field and the left lower field. He has no wheezes. He has no rhonchi. He has no rales.  GI: Soft. Bowel sounds are normal. There is tenderness in the right upper quadrant.  Musculoskeletal:       Right ankle: He exhibits no swelling.       Left ankle: He exhibits no swelling.  Lymphadenopathy:    He has no cervical adenopathy.  Neurological: He is alert.  Unable to move his eyes to the left.  Weakness left side of the body  Skin: No rash noted. He is diaphoretic. Nails show no clubbing.   Psychiatric:  Answers a few questions and follow some simple commands.      Data Reviewed: Basic Metabolic Panel: Recent Labs  Lab 09/07/18 0305  NA 138  K 3.9  CL 100  CO2 22  GLUCOSE 215*  BUN 21  CREATININE 1.39*  CALCIUM 9.4   Liver Function Tests: Recent Labs  Lab 09/07/18 0305  AST 37  ALT 22  ALKPHOS 276*  BILITOT 0.5  PROT 7.2  ALBUMIN 3.1*   Recent Labs  Lab 09/07/18 0305  LIPASE 22   Recent Labs  Lab 09/07/18 0305  AMMONIA 17   CBC: Recent Labs  Lab 09/07/18 0305  WBC 15.1*  NEUTROABS 12.2*  HGB 8.7*  HCT 28.0*  MCV 88.6  PLT 463*   Cardiac Enzymes: Recent Labs  Lab 09/07/18 0305  TROPONINI 0.03*     Recent Results (from the past 240 hour(s))  Urine culture     Status: None   Collection Time: 09/07/18  3:05 AM  Result Value Ref Range Status   Specimen Description   Final    URINE, RANDOM Performed at [facility], [address], [place], [place] [address]    Special Requests   Final    NONE Performed at [facility], [address] [place], [place] [address]    Culture   Final    NO GROWTH Performed  at [facility], [place] [address], [place], [place] [address]    Report Status 09/08/2018 FINAL  Final  Blood Culture (routine x 2)     Status: None (Preliminary result)   Collection Time: 09/07/18  3:06 AM  Result Value Ref Range Status   Specimen Description BLOOD RIGHT FOREARM  Final   Special Requests   Final    BOTTLES DRAWN AEROBIC AND ANAEROBIC Blood Culture results may not be optimal due to an excessive volume of blood received in culture bottles   Culture   Final    NO GROWTH 2 DAYS Performed at [facility], [address], [place], [place] [address]    Report Status PENDING  Incomplete  Blood Culture (routine x 2)     Status: Abnormal (Preliminary result)   Collection Time: 09/07/18  3:06 AM  Result Value Ref Range Status   Specimen Description   Final    BLOOD RIGHT  FOREARM INNER Performed at [facility], [address], [place], [place] [address]    Special Requests   Final    BOTTLES DRAWN AEROBIC AND ANAEROBIC Blood Culture adequate volume Performed at [facility], [address] [place], [place] [address]    Culture  Setup Time   Final    GRAM POSITIVE COCCI AEROBIC BOTTLE ONLY CRITICAL RESULT CALLED TO, READ BACK BY AND VERIFIED WITH: MATT MCBANE 09/07/18 2207 REC    Culture (A)  Final    STAPHYLOCOCCUS SPECIES (COAGULASE NEGATIVE) THE SIGNIFICANCE OF ISOLATING THIS ORGANISM FROM A SINGLE SET OF BLOOD CULTURES WHEN MULTIPLE SETS ARE DRAWN IS UNCERTAIN. PLEASE NOTIFY THE MICROBIOLOGY DEPARTMENT WITHIN ONE WEEK IF SPECIATION AND SENSITIVITIES ARE REQUIRED. Performed at [facility], [employer] [address] [place], [place] [address]    Report Status PENDING  Incomplete  Blood Culture ID Panel (Reflexed)     Status: Abnormal   Collection Time: 09/07/18  3:06 AM  Result Value Ref Range Status   Enterococcus species NOT DETECTED NOT DETECTED Final   Vancomycin resistance NOT DETECTED NOT DETECTED Final   Listeria monocytogenes NOT DETECTED NOT DETECTED Final   Staphylococcus species DETECTED (A) NOT DETECTED Final    Comment: CRITICAL RESULT CALLED TO, READ BACK BY AND VERIFIED WITH: MATT MCBANE 09/07/18 2207 REC Methicillin (oxacillin) resistant coagulase negative staphylococcus. Possible blood culture contaminant (unless isolated from more than one blood culture draw or clinical case suggests pathogenicity). No antibiotic treatment is indicated for blood  culture contaminants.    Staphylococcus aureus (BCID) NOT DETECTED NOT DETECTED Final   Methicillin resistance DETECTED (A) NOT DETECTED Final    Comment: CRITICAL RESULT CALLED TO, READ BACK BY AND VERIFIED WITH: MATT MCBANE 09/07/18 2207 REC    Streptococcus species NOT DETECTED NOT DETECTED Final   Streptococcus agalactiae NOT DETECTED NOT DETECTED Final    Streptococcus pneumoniae NOT DETECTED NOT DETECTED Final   Streptococcus pyogenes NOT DETECTED NOT DETECTED Final   Acinetobacter baumannii NOT DETECTED NOT DETECTED Final   Enterobacteriaceae species NOT DETECTED NOT DETECTED Final   Enterobacter cloacae complex NOT DETECTED NOT DETECTED Final   Escherichia coli NOT DETECTED NOT DETECTED Final   Klebsiella oxytoca NOT DETECTED NOT DETECTED Final   Klebsiella pneumoniae NOT DETECTED NOT DETECTED Final   Proteus species NOT DETECTED NOT DETECTED Final   Serratia marcescens NOT DETECTED NOT DETECTED Final   Carbapenem resistance NOT DETECTED NOT DETECTED Final   Haemophilus influenzae NOT DETECTED NOT DETECTED Final   Neisseria  meningitidis NOT DETECTED NOT DETECTED Final   Pseudomonas aeruginosa NOT DETECTED NOT DETECTED Final   Candida albicans NOT DETECTED NOT DETECTED Final   Candida glabrata NOT DETECTED NOT DETECTED Final   Candida krusei NOT DETECTED NOT DETECTED Final   Candida parapsilosis NOT DETECTED NOT DETECTED Final   Candida tropicalis NOT DETECTED NOT DETECTED Final    Comment: Performed at [facility], [address] [place], [place] [address]     Scheduled Meds: . dexamethasone  4 mg Oral Q8H  . ipratropium-albuterol  3 mL Nebulization Q6H  . levETIRAcetam  500 mg Oral BID  . mouth rinse  15 mL Mouth Rinse BID   Continuous Infusions:  Assessment/Plan:  1. 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  Change Decadron and Keppra to oral. 2. Seizures.  Change Keppra to oral 3. UTI-urine culture negative.  Blood culture likely contamination 4. Lactic acidosis secondary to seizure 5. History of diabetes 6. History of hypertension 7. History of hyperlipidemia 8. History of CVA 9. Speech therapy put on a diet 10. Family interested in hospice.  Evaluation for hospice facility.  Code Status:     Code Status Orders  (From admission, onward)         Start     Ordered   09/07/18  0632  Do not attempt resuscitation (DNR)  Continuous    Question Answer Comment  In the event of cardiac or respiratory ARREST Do not call a "code blue"   In the event of cardiac or respiratory ARREST Do not perform Intubation, CPR, defibrillation or ACLS   In the event of cardiac or respiratory ARREST Use medication by any route, position, wound care, and other measures to relive pain and suffering. May use oxygen, suction and manual treatment of airway obstruction as needed for comfort.      09/07/18 0631        Code Status History    Date Active Date Inactive Code Status Order ID Comments User Context   08/27/2018 4315 08/30/2018 1857 Full Code [id number]  Gorden Harms, MD Inpatient   05/21/2018 0016 05/22/2018 1343 Full Code [id number]  Lance Coon, MD ED   05/07/2018 2248 05/12/2018 2006 Full Code [id number]  Harrie Foreman, MD Inpatient   09/09/2017 0242 09/09/2017 1722 Full Code [id number]  Demetrios Loll, MD Inpatient   06/25/2017 1347 06/27/2017 1659 Full Code [id number]  Nestor Lewandowsky, MD Inpatient   04/23/2017 0134 04/23/2017 1657 Full Code [id number]  Hugelmeyer, Ubaldo Glassing, DO Inpatient    Advance Directive Documentation     Most Recent Value  Type of Advance Directive  Healthcare Power of Attorney, Living will  Pre-existing out of facility DNR order (yellow form or pink MOST form)  -  "MOST" Form in Place?  -     Family Communication: Family at the bedside Disposition Plan: To be determined  Consultants:  Palliative care  Time spent: 28 minutes  [place]

## 2018-09-09 NOTE — Progress Notes (Signed)
New hospice home referral received from [facility]. Patient is a 72 year old man with metastatic mesothelioma with disease progression, diabetes, HTN, HLD, CVA, DVT and CKD. He was admitted to [facility] on 10/14 with new onset seizures with a prolonged post ictal state. Head CT revealed bilateral supratentorial and infratentorial intra-axial masses with vasogenic edema consistent with metastatic disease. Family chose to focus on comfort and patient was admitted. Palliative medicine was consulted.  Patient was intiated on a morphine drip. Palliative medicine NP Merrily Pew Borders has been following. Patient became more alert on 10/15 and morphine drip was discontinued. He was evaluated by speech therapy and started on a diet. He was able to take a few bites today. He continues with left sided weakness. IV steroids and IV anti-seizure medications have been changed to oral. Per discussion with NP Josh Borders it is not expected that patient will be able to take in enough nutrition to sustain himself and feels that patient has a 2-3 week prognosis.  Writer met in the room with patient, his wife Vaughan Basta and her siblings to initiate education regarding hospice services, philosophy and team approach to care with understanding voiced. Questions answered, consents signed. Patient was alert and was able to greet writer, but did not participate in the conversation. Patient information faxed to referral. Plan is for transfer to the Hospice Home tomorrow via EMS with signed DNR in place. Hospital care team and family updated and aware. Will continue to follow through discharge. Thank you for the opportunity to be involved in the care of this patient and his family.  Flo Shanks RN, BSN, [facility] [place], hospital Liaison [phone]

## 2018-09-10 LAB — CULTURE, BLOOD (ROUTINE X 2): Special Requests: ADEQUATE

## 2018-09-10 MED ORDER — DEXAMETHASONE 4 MG PO TABS: 4.0000 mg | ORAL_TABLET | Freq: Two times a day (BID) | ORAL | 0 refills | Status: AC

## 2018-09-10 MED ORDER — MORPHINE SULFATE (CONCENTRATE) 10 MG/0.5ML PO SOLN: 10.0000 mg | ORAL | 0 refills | Status: AC | PRN

## 2018-09-10 MED ORDER — IPRATROPIUM-ALBUTEROL 0.5-2.5 (3) MG/3ML IN SOLN: 3.0000 mL | Freq: Four times a day (QID) | RESPIRATORY_TRACT | 0 refills | Status: AC

## 2018-09-10 MED ORDER — MORPHINE SULFATE (CONCENTRATE) 10 MG/0.5ML PO SOLN: 10.0000 mg | ORAL | Status: DC | PRN

## 2018-09-10 MED ORDER — DIAZEPAM 2.5 MG RE GEL: 2.5000 mg | Freq: Every day | RECTAL | 0 refills | Status: AC | PRN

## 2018-09-10 MED ORDER — LEVETIRACETAM 500 MG PO TABS: 500.0000 mg | ORAL_TABLET | Freq: Two times a day (BID) | ORAL | 0 refills | Status: AC

## 2018-09-10 MED ORDER — FLUCONAZOLE 100 MG PO TABS: 100.0000 mg | ORAL_TABLET | Freq: Every day | ORAL | 0 refills | Status: AC

## 2018-09-10 MED ORDER — DIAZEPAM 2.5 MG RE GEL
2.5000 mg | Freq: Every day | RECTAL | Status: DC | PRN
  Filled 2018-09-10: qty 2.5

## 2018-09-10 NOTE — Progress Notes (Signed)
Follow up visit made to new hospice home referral. Patient seen lying in bed, wife Vaughan Basta at bedside. Patient did Recruitment consultant, but affect remained flat. He was able to take oral medications crushed in applesauce, breakfast tray at bedside, patient ate bites. Plan is for discharge to the hospice home today via EMS. Signed DNR in place. Family and hospital staff updated. Report called to the hospice home, EMS notified for transport. Discharge summary faxed to referral. Flo Shanks RN, BSN, [facility] [place], hospital liaison [phone]

## 2018-09-10 NOTE — Care Management Note (Signed)
Case Management Note  Patient Details  Name: Ian Shaffer MRN: [id number] Date of Birth: [birth year]  Subjective/Objective:                 Palliative indicates that family wishes to pursue residential hospice   Action/Plan:   Expected Discharge Date:                  Expected Discharge Plan:     In-House Referral:     Discharge planning Services     Post Acute Care Choice:    Choice offered to:     DME Arranged:    DME Agency:     HH Arranged:    [place]ard Agency:     Status of Service:     If discussed at [employer] of Stay Meetings, dates discussed:    Additional Comments:  Katrina Stack, RN 09/10/2018, 9:00 AM

## 2018-09-10 NOTE — Discharge Summary (Signed)
[place] [facility] [place] NAME: Ian Shaffer    MR#:  [id number]  DATE OF BIRTH:  10-29-1946  DATE OF ADMISSION:  09/07/2018 ADMITTING PHYSICIAN: Arta Silence, MD  DATE OF DISCHARGE: 09/10/2018  PRIMARY CARE PHYSICIAN: Derinda Late, MD    ADMISSION DIAGNOSIS:  Lactic acidosis [E87.2] Encephalopathy acute [G93.40] Brain metastases ([place]) [C79.31]  DISCHARGE DIAGNOSIS:  Active Problems:   Brain metastases [facility])   Palliative care encounter   SECONDARY DIAGNOSIS:   Past Medical History:  Diagnosis Date  . Benign essential hypertension 05/31/2014  . Cancer ([place]) 06/2017   right lung   . Cerebral infarction ([place]) 05/31/2014  . Cerebrovascular disease 05/31/2014  . CKD (chronic kidney disease) stage 3, GFR 30-59 ml/min (HCC) 02/23/2016  . CVA (cerebral infarction)   . Diabetes mellitus without complication ([place])   . Difficult intubation   . DVT (deep venous thrombosis) ([place])   . Esophageal reflux 05/31/2014  . H/O: CVA (cerebrovascular accident)   . Hyperlipidemia   . Hypertension   . ICAO (internal carotid artery occlusion) March 01, 2014   Left  . 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 Pleural effusion 04/23/2017  . Stroke [facility]) April, 4,2015  . Type 2 diabetes mellitus with peripheral neuropathy ([place]) 02/23/2016  . Weakness     HOSPITAL COURSE:   1.  Metastatic brain lesions for malignant mesothelioma and brain edema.  The patient initially was made comfort care and placed on morphine drip but then he woke up.  He was empirically placed on Decadron and Keppra.  These were switched over to oral since he is now able to take oral medications.  Prognosis is still very poor and he will be discharged to the hospice home for comfort measures.  Patient was seen by palliative care and Dr. Rogue Bussing from oncology.  Oxycodone and Roxanol as needed 2.  Seizures.  Patient was placed on IV Keppra and switched over to oral.   When the patient is not able to take oral can give Valium rectally. 3.  Thrush patient placed on Diflucan for 6 days then stop. 4.  Lactic acidosis secondary to seizure. 5.  Abdominal pain secondary to liver metastases 6.  History of diabetes 7.  History of hyperlipidemia 8.  History of hypertension 9.  History of CVA 10.  Speech therapy put on dysphagia 3 diet with thin liquids.  As per family patient did not eat much. 11.  Blood culture is a skin contamination and not a real positive results. 12.  Urine culture negative and this is not a urinary tract infection.  Antibiotics stopped.   DISCHARGE CONDITIONS:   Guarded  CONSULTS OBTAINED:  Treatment Team:  Arta Silence, MD  DRUG ALLERGIES:   Allergies  Allergen Reactions  . Sulfa Antibiotics Swelling    Facial swelling No tongue or lips swelling, no difficulty breathing.  . Adhesive [Tape] Itching and Rash  . Amoxicillin Nausea Only and Other (See Comments)    Has patient had a PCN reaction causing immediate rash, facial/tongue/throat swelling, SOB or lightheadedness with hypotension: No Has patient had a PCN reaction causing severe rash involving mucus membranes or skin necrosis: No Has patient had a PCN reaction that required hospitalization: No Has patient had a PCN reaction occurring within the last 10 years: Yes If all of the above answers are "NO", then may proceed with Cephalosporin use.   . Other Rash    Surgical tape    DISCHARGE  MEDICATIONS:   Allergies as of 09/10/2018      Reactions   Sulfa Antibiotics Swelling   Facial swelling No tongue or lips swelling, no difficulty breathing.   Adhesive [tape] Itching, Rash   Amoxicillin Nausea Only, Other (See Comments)   Has patient had a PCN reaction causing immediate rash, facial/tongue/throat swelling, SOB or lightheadedness with hypotension: No Has patient had a PCN reaction causing severe rash involving mucus membranes or skin necrosis: No Has patient  had a PCN reaction that required hospitalization: No Has patient had a PCN reaction occurring within the last 10 years: Yes If all of the above answers are "NO", then may proceed with Cephalosporin use.   Other Rash   Surgical tape      Medication List    STOP taking these medications   acetaminophen 325 MG tablet Commonly known as:  TYLENOL   amLODipine 5 MG tablet Commonly known as:  NORVASC   aspirin EC 81 MG tablet   CENTRUM SILVER PO   clopidogrel 75 MG tablet Commonly known as:  PLAVIX   dronabinol 2.5 MG capsule Commonly known as:  MARINOL   fluticasone 50 MCG/ACT nasal spray Commonly known as:  FLONASE   lansoprazole 30 MG capsule Commonly known as:  PREVACID   metoprolol succinate 25 MG 24 hr tablet Commonly known as:  TOPROL-XL   montelukast 10 MG tablet Commonly known as:  SINGULAIR   oxyCODONE 5 MG immediate release tablet Commonly known as:  Oxy IR/ROXICODONE   pantoprazole 40 MG tablet Commonly known as:  PROTONIX   polyethylene glycol powder powder Commonly known as:  GLYCOLAX/MIRALAX   pravastatin 40 MG tablet Commonly known as:  PRAVACHOL   VICTOZA 18 MG/3ML Sopn Generic drug:  liraglutide     TAKE these medications   dexamethasone 4 MG tablet Commonly known as:  DECADRON Take 1 tablet (4 mg total) by mouth 2 (two) times daily.   diazepam 2.5 MG Gel Commonly known as:  DIASTAT Place 2.5 mg rectally daily as needed for seizure.   fluconazole 100 MG tablet Commonly known as:  DIFLUCAN Take 1 tablet (100 mg total) by mouth daily.   ipratropium-albuterol 0.5-2.5 (3) MG/3ML Soln Commonly known as:  DUONEB Take 3 mLs by nebulization every 6 (six) hours.   levETIRAcetam 500 MG tablet Commonly known as:  KEPPRA Take 1 tablet (500 mg total) by mouth 2 (two) times daily.   morphine CONCENTRATE 10 MG/0.5ML Soln concentrated solution Take 0.5 mLs (10 mg total) by mouth every 2 (two) hours as needed for severe pain.         DISCHARGE INSTRUCTIONS:    Discharge to hospice home when bed available  If you experience worsening of your admission symptoms, develop shortness of breath, life threatening emergency, suicidal or homicidal thoughts you must seek medical attention immediately by calling 911 or calling your MD immediately  if symptoms less severe.  You Must read complete instructions/literature along with all the possible adverse reactions/side effects for all the Medicines you take and that have been prescribed to you. Take any new Medicines after you have completely understood and accept all the possible adverse reactions/side effects.   Please note  You were cared for by a hospitalist during your hospital stay. If you have any questions about your discharge medications or the care you received while you were in the hospital after you are discharged, you can call the unit and asked to speak with the hospitalist on call if the hospitalist  that took care of you is not available. Once you are discharged, your primary care physician will handle any further medical issues. Please note that NO REFILLS for any discharge medications will be authorized once you are discharged, as it is imperative that you return to your primary care physician (or establish a relationship with a primary care physician if you do not have one) for your aftercare needs so that they can reassess your need for medications and monitor your lab values.    Today   CHIEF COMPLAINT:   Chief Complaint  Patient presents with  . Altered Mental Status    HISTORY OF PRESENT ILLNESS:  Jimie Kuwahara  is a 72 y.o. male brought in with altered mental status and found to have brain metastases and edema.   VITAL SIGNS:  Blood pressure 123/62, pulse 76, temperature 98.3 F (36.8 C), temperature source Oral, resp. rate 18, height 5\' 10"  (1.778 m), weight 82.1 kg, SpO2 97 %.   PHYSICAL EXAMINATION:  GENERAL:  72 y.o.-year-old patient lying  in the bed with no acute distress.  EYES: Pupils equal, round, reactive to light and accommodation. No scleral icterus.  Patient able to move his eyes to the left a little bit better than yesterday. HEENT: Head atraumatic, normocephalic. Oropharynx and nasopharynx clear.  NECK:  Supple, no jugular venous distention. No thyroid enlargement, no tenderness.  LUNGS: Normal breath sounds bilaterally, no wheezing, rales,rhonchi or crepitation. No use of accessory muscles of respiration.  CARDIOVASCULAR: S1, S2 normal. No murmurs, rubs, or gallops.  ABDOMEN: Soft, non-tender, non-distended. Bowel sounds present. No organomegaly or mass.  EXTREMITIES: No pedal edema, cyanosis, or clubbing.  NEUROLOGIC: Cranial nerves II through XII are intact.  Weakness left side PSYCHIATRIC: The patient is alert and answers questions SKIN: No obvious rash, lesion, or ulcer.   DATA REVIEW:   CBC Recent Labs  Lab 09/07/18 0305  WBC 15.1*  HGB 8.7*  HCT 28.0*  PLT 463*    Chemistries  Recent Labs  Lab 09/07/18 0305  NA 138  K 3.9  CL 100  CO2 22  GLUCOSE 215*  BUN 21  CREATININE 1.39*  CALCIUM 9.4  AST 37  ALT 22  ALKPHOS 276*  BILITOT 0.5    Cardiac Enzymes Recent Labs  Lab 09/07/18 0305  TROPONINI 0.03*    Microbiology Results  Results for orders placed or performed during the hospital encounter of 09/07/18  Urine culture     Status: None   Collection Time: 09/07/18  3:05 AM  Result Value Ref Range Status   Specimen Description   Final    URINE, RANDOM Performed at [facility], [address], [place], [place] [address]    Special Requests   Final    NONE Performed at [facility], [address], [place], [place] [address]    Culture   Final    NO GROWTH Performed at [facility], [place] [address] [place], [place] [address]    Report Status 09/08/2018 FINAL  Final  Blood Culture (routine x 2)     Status: None (Preliminary result)   Collection  Time: 09/07/18  3:06 AM  Result Value Ref Range Status   Specimen Description BLOOD RIGHT FOREARM  Final   Special Requests   Final    BOTTLES DRAWN AEROBIC AND ANAEROBIC Blood Culture results may not be optimal due to an excessive volume of blood received in culture bottles   Culture   Final  NO GROWTH 3 DAYS Performed at [facility], [address] [place], [place] [address]    Report Status PENDING  Incomplete  Blood Culture (routine x 2)     Status: Abnormal   Collection Time: 09/07/18  3:06 AM  Result Value Ref Range Status   Specimen Description   Final    BLOOD RIGHT FOREARM INNER Performed at [facility], [address] [place], [place] [address]    Special Requests   Final    BOTTLES DRAWN AEROBIC AND ANAEROBIC Blood Culture adequate volume Performed at [facility], [address] [place], [place] [address]    Culture  Setup Time   Final    GRAM POSITIVE COCCI AEROBIC BOTTLE ONLY CRITICAL RESULT CALLED TO, READ BACK BY AND VERIFIED WITH: MATT MCBANE 09/07/18 2207 REC    Culture (A)  Final    STAPHYLOCOCCUS SPECIES (COAGULASE NEGATIVE) THE SIGNIFICANCE OF ISOLATING THIS ORGANISM FROM A SINGLE SET OF BLOOD CULTURES WHEN MULTIPLE SETS ARE DRAWN IS UNCERTAIN. PLEASE NOTIFY THE MICROBIOLOGY DEPARTMENT WITHIN ONE WEEK IF SPECIATION AND SENSITIVITIES ARE REQUIRED. Performed at [facility], [place] [address] [place], [place] [address]    Report Status 09/10/2018 FINAL  Final  Blood Culture ID Panel (Reflexed)     Status: Abnormal   Collection Time: 09/07/18  3:06 AM  Result Value Ref Range Status   Enterococcus species NOT DETECTED NOT DETECTED Final   Vancomycin resistance NOT DETECTED NOT DETECTED Final   Listeria monocytogenes NOT DETECTED NOT DETECTED Final   Staphylococcus species DETECTED (A) NOT DETECTED Final    Comment: CRITICAL RESULT CALLED TO, READ BACK BY AND VERIFIED WITH: MATT MCBANE 09/07/18 2207 REC Methicillin  (oxacillin) resistant coagulase negative staphylococcus. Possible blood culture contaminant (unless isolated from more than one blood culture draw or clinical case suggests pathogenicity). No antibiotic treatment is indicated for blood  culture contaminants.    Staphylococcus aureus (BCID) NOT DETECTED NOT DETECTED Final   Methicillin resistance DETECTED (A) NOT DETECTED Final    Comment: CRITICAL RESULT CALLED TO, READ BACK BY AND VERIFIED WITH: MATT MCBANE 09/07/18 2207 REC    Streptococcus species NOT DETECTED NOT DETECTED Final   Streptococcus agalactiae NOT DETECTED NOT DETECTED Final   Streptococcus pneumoniae NOT DETECTED NOT DETECTED Final   Streptococcus pyogenes NOT DETECTED NOT DETECTED Final   Acinetobacter baumannii NOT DETECTED NOT DETECTED Final   Enterobacteriaceae species NOT DETECTED NOT DETECTED Final   Enterobacter cloacae complex NOT DETECTED NOT DETECTED Final   Escherichia coli NOT DETECTED NOT DETECTED Final   Klebsiella oxytoca NOT DETECTED NOT DETECTED Final   Klebsiella pneumoniae NOT DETECTED NOT DETECTED Final   Proteus species NOT DETECTED NOT DETECTED Final   Serratia marcescens NOT DETECTED NOT DETECTED Final   Carbapenem resistance NOT DETECTED NOT DETECTED Final   Haemophilus influenzae NOT DETECTED NOT DETECTED Final   Neisseria meningitidis NOT DETECTED NOT DETECTED Final   Pseudomonas aeruginosa NOT DETECTED NOT DETECTED Final   Candida albicans NOT DETECTED NOT DETECTED Final   Candida glabrata NOT DETECTED NOT DETECTED Final   Candida krusei NOT DETECTED NOT DETECTED Final   Candida parapsilosis NOT DETECTED NOT DETECTED Final   Candida tropicalis NOT DETECTED NOT DETECTED Final    Comment: Performed at [facility], [address], [place], [place] [address]      Management plans discussed with the patient, family and they are in agreement.  CODE STATUS:     Code Status  Orders  (From admission, onward)         Start      Ordered   09/07/18 0632  Do not attempt resuscitation (DNR)  Continuous    Question Answer Comment  In the event of cardiac or respiratory ARREST Do not call a "code blue"   In the event of cardiac or respiratory ARREST Do not perform Intubation, CPR, defibrillation or ACLS   In the event of cardiac or respiratory ARREST Use medication by any route, position, wound care, and other measures to relive pain and suffering. May use oxygen, suction and manual treatment of airway obstruction as needed for comfort.      09/07/18 0631        Code Status History    Date Active Date Inactive Code Status Order ID Comments User Context   08/27/2018 9563 08/30/2018 1857 Full Code [id number]  Gorden Harms, MD Inpatient   05/21/2018 0016 05/22/2018 1343 Full Code [id number]  Lance Coon, MD ED   05/07/2018 2248 05/12/2018 2006 Full Code [id number]  Harrie Foreman, MD Inpatient   09/09/2017 0242 09/09/2017 1722 Full Code [id number]  Demetrios Loll, MD Inpatient   06/25/2017 1347 06/27/2017 1659 Full Code [id number]  Nestor Lewandowsky, MD Inpatient   04/23/2017 0134 04/23/2017 1657 Full Code [id number]  Hugelmeyer, Ubaldo Glassing, DO Inpatient    Advance Directive Documentation     Most Recent Value  Type of Advance Directive  Healthcare Power of [employer], Living will  Pre-existing out of facility DNR order (yellow form or pink MOST form)  -  "MOST" Form in Place?  -      TOTAL TIME TAKING CARE OF THIS PATIENT: 32 minutes.    Loletha Grayer M.D on 09/10/2018 at 9:14 AM  Between 7am to 6pm - Pager - [phone]  After 6pm go to [email or web address] - password [email or web address] [facility]  [phone]  CC: Primary care physician; Derinda Late, MD

## 2018-09-10 NOTE — Progress Notes (Signed)
Ian Shaffer   DOB:March 23, 1946   HW#:[id number]    Subjective: Patient resting in the bed comfortably.  Accompanied by multiple family members.  Denies any nausea vomiting.  Positive for constipation.  No further seizures while in hospital.  He is awaiting hospice bed today.  Objective:  Vitals:   09/09/18 0443 09/10/18 0749  BP: 123/62   Pulse: 76   Resp: 18   Temp: 98.3 F (36.8 C)   SpO2: 96% 97%     Intake/Output Summary (Last 24 hours) at 09/10/2018 2152 Last data filed at 09/10/2018 1038 Gross per 24 hour  Intake -  Output 1950 ml  Net -1950 ml    GENERAL: Ill-appearing Caucasian male patient.  He is in no acute distress.  Accompanied by multiple family members including brother wife and other family. EYES: no pallor or icterus OROPHARYNX: no thrush or ulceration. NECK: supple, no masses felt LYMPH:  no palpable lymphadenopathy in the cervical, axillary or inguinal regions LUNGS: decreased breath sounds to auscultation at bases and  No wheeze or crackles HEART/CVS: regular rate & rhythm and no murmurs; No lower extremity edema ABDOMEN: abdomen soft, non-tender and normal bowel sounds Musculoskeletal:no cyanosis of digits and no clubbing  PSYCH: Drowsy; easily arousable. NEURO: Limited exam given patient's mentation. SKIN:  no rashes or significant lesions   Labs:  Lab Results  Component Value Date   WBC 15.1 (H) 09/07/2018   HGB 8.7 (L) 09/07/2018   HCT 28.0 (L) 09/07/2018   MCV 88.6 09/07/2018   PLT 463 (H) 09/07/2018   NEUTROABS 12.2 (H) 09/07/2018    Lab Results  Component Value Date   NA 138 09/07/2018   K 3.9 09/07/2018   CL 100 09/07/2018   CO2 22 09/07/2018    Studies:  No results found.  Assessment & Plan:   # 72 year old male patient history of mesothelioma currently admitted to hospital for seizures.  # Metastatic mesothelioma-most recently progressed on third line chemotherapy with gemcitabine.   Declined further therapy given the  declining performance status/poor tolerance/poor response rates.    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   Declined further therapy continue steroids/antiepileptics  # Overall prognosis is extremely poor.  DNR/DNI.  Life expectancy in the order of few weeks.  Recommend/agree with hospice home discharge.  Discussed with the patient and family detail.  They agree.  Cammie Sickle, MD 09/10/2018  9:52 PM

## 2018-09-12 LAB — CULTURE, BLOOD (ROUTINE X 2): CULTURE: NO GROWTH

## 2018-09-17 ENCOUNTER — Ambulatory Visit: Admission: RE | Admit: 2018-09-17 | Payer: Medicare Other | Source: Ambulatory Visit | Admitting: Gastroenterology

## 2018-09-17 ENCOUNTER — Encounter: Admission: RE | Payer: Self-pay | Source: Ambulatory Visit

## 2018-09-17 SURGERY — IMAGING PROCEDURE, GI TRACT, INTRALUMINAL, VIA CAPSULE
Anesthesia: General

## 2018-09-23 ENCOUNTER — Inpatient Hospital Stay: Payer: Medicare Other

## 2018-09-23 ENCOUNTER — Inpatient Hospital Stay: Payer: Medicare Other | Admitting: Internal Medicine

## 2018-09-25 DEATH — deceased

## 2018-12-07 ENCOUNTER — Ambulatory Visit: Payer: Medicare Other | Admitting: Gastroenterology
